# Patient Record
Sex: Male | Born: 1960
Health system: Southern US, Community
[De-identification: ages and names within clinical notes are randomized; demographics above are authoritative.]

## PROBLEM LIST (undated history)

## (undated) DIAGNOSIS — G4733 Obstructive sleep apnea (adult) (pediatric): Secondary | ICD-10-CM

## (undated) DIAGNOSIS — Z8679 Personal history of other diseases of the circulatory system: Secondary | ICD-10-CM

## (undated) DIAGNOSIS — Z9289 Personal history of other medical treatment: Secondary | ICD-10-CM

## (undated) DIAGNOSIS — R0789 Other chest pain: Secondary | ICD-10-CM

## (undated) DIAGNOSIS — Z952 Presence of prosthetic heart valve: Secondary | ICD-10-CM

## (undated) DIAGNOSIS — E669 Obesity, unspecified: Secondary | ICD-10-CM

## (undated) DIAGNOSIS — I10 Essential (primary) hypertension: Secondary | ICD-10-CM

## (undated) DIAGNOSIS — R569 Unspecified convulsions: Secondary | ICD-10-CM

## (undated) DIAGNOSIS — L409 Psoriasis, unspecified: Secondary | ICD-10-CM

## (undated) DIAGNOSIS — E785 Hyperlipidemia, unspecified: Secondary | ICD-10-CM

## (undated) DIAGNOSIS — Z951 Presence of aortocoronary bypass graft: Secondary | ICD-10-CM

## (undated) HISTORY — DX: Hyperlipidemia, unspecified: E78.5

## (undated) HISTORY — DX: Obstructive sleep apnea (adult) (pediatric): G47.33

## (undated) HISTORY — PX: CORNEAL TRANSPLANT: SHX108

## (undated) HISTORY — DX: Obesity, unspecified: E66.9

## (undated) HISTORY — PX: KNEE SURGERY: SHX244

## (undated) HISTORY — DX: Essential (primary) hypertension: I10

## (undated) HISTORY — DX: Unspecified convulsions: R56.9

## (undated) HISTORY — DX: Presence of prosthetic heart valve: Z95.2

## (undated) HISTORY — DX: Psoriasis, unspecified: L40.9

## (undated) HISTORY — DX: Other chest pain: R07.89

## (undated) HISTORY — DX: Personal history of other medical treatment: Z92.89

---

## 1998-08-08 ENCOUNTER — Other Ambulatory Visit: Admission: RE | Admit: 1998-08-08 | Discharge: 1998-08-08 | Payer: Self-pay | Admitting: *Deleted

## 1999-02-17 ENCOUNTER — Emergency Department (HOSPITAL_COMMUNITY): Admission: EM | Admit: 1999-02-17 | Discharge: 1999-02-17 | Payer: Self-pay | Admitting: Emergency Medicine

## 2000-04-19 ENCOUNTER — Encounter: Payer: Self-pay | Admitting: Emergency Medicine

## 2000-04-19 ENCOUNTER — Emergency Department (HOSPITAL_COMMUNITY): Admission: EM | Admit: 2000-04-19 | Discharge: 2000-04-19 | Payer: Self-pay | Admitting: Emergency Medicine

## 2000-08-19 ENCOUNTER — Encounter: Admission: RE | Admit: 2000-08-19 | Discharge: 2000-11-17 | Payer: Self-pay | Admitting: Internal Medicine

## 2000-10-06 ENCOUNTER — Ambulatory Visit (HOSPITAL_COMMUNITY): Admission: RE | Admit: 2000-10-06 | Discharge: 2000-10-06 | Payer: Self-pay | Admitting: Gastroenterology

## 2001-10-06 ENCOUNTER — Emergency Department (HOSPITAL_COMMUNITY): Admission: EM | Admit: 2001-10-06 | Discharge: 2001-10-06 | Payer: Self-pay | Admitting: Emergency Medicine

## 2001-10-06 ENCOUNTER — Encounter: Payer: Self-pay | Admitting: Emergency Medicine

## 2002-07-05 ENCOUNTER — Encounter: Payer: Self-pay | Admitting: Emergency Medicine

## 2002-07-05 ENCOUNTER — Emergency Department (HOSPITAL_COMMUNITY): Admission: EM | Admit: 2002-07-05 | Discharge: 2002-07-05 | Payer: Self-pay | Admitting: Emergency Medicine

## 2002-11-12 ENCOUNTER — Emergency Department (HOSPITAL_COMMUNITY): Admission: EM | Admit: 2002-11-12 | Discharge: 2002-11-12 | Payer: Self-pay | Admitting: *Deleted

## 2002-12-17 ENCOUNTER — Emergency Department (HOSPITAL_COMMUNITY): Admission: EM | Admit: 2002-12-17 | Discharge: 2002-12-17 | Payer: Self-pay | Admitting: Emergency Medicine

## 2002-12-17 ENCOUNTER — Encounter: Payer: Self-pay | Admitting: Emergency Medicine

## 2003-01-27 ENCOUNTER — Encounter: Payer: Self-pay | Admitting: Emergency Medicine

## 2003-01-27 ENCOUNTER — Emergency Department (HOSPITAL_COMMUNITY): Admission: EM | Admit: 2003-01-27 | Discharge: 2003-01-27 | Payer: Self-pay | Admitting: Emergency Medicine

## 2004-09-14 ENCOUNTER — Emergency Department (HOSPITAL_COMMUNITY): Admission: EM | Admit: 2004-09-14 | Discharge: 2004-09-14 | Payer: Self-pay | Admitting: Family Medicine

## 2004-12-06 ENCOUNTER — Inpatient Hospital Stay (HOSPITAL_COMMUNITY): Admission: EM | Admit: 2004-12-06 | Discharge: 2004-12-13 | Payer: Self-pay | Admitting: Emergency Medicine

## 2004-12-06 ENCOUNTER — Ambulatory Visit: Payer: Self-pay | Admitting: Cardiology

## 2004-12-06 ENCOUNTER — Encounter (INDEPENDENT_AMBULATORY_CARE_PROVIDER_SITE_OTHER): Payer: Self-pay | Admitting: *Deleted

## 2004-12-06 ENCOUNTER — Encounter: Payer: Self-pay | Admitting: Cardiology

## 2004-12-06 HISTORY — PX: OTHER SURGICAL HISTORY: SHX169

## 2004-12-09 ENCOUNTER — Encounter: Payer: Self-pay | Admitting: Internal Medicine

## 2004-12-16 ENCOUNTER — Ambulatory Visit: Payer: Self-pay | Admitting: Cardiology

## 2004-12-20 ENCOUNTER — Ambulatory Visit: Payer: Self-pay | Admitting: Cardiology

## 2004-12-24 ENCOUNTER — Encounter: Admission: RE | Admit: 2004-12-24 | Discharge: 2004-12-24 | Payer: Self-pay | Admitting: Surgery

## 2004-12-26 ENCOUNTER — Ambulatory Visit: Payer: Self-pay | Admitting: Cardiology

## 2004-12-27 ENCOUNTER — Ambulatory Visit: Payer: Self-pay

## 2004-12-30 ENCOUNTER — Encounter (HOSPITAL_COMMUNITY): Admission: RE | Admit: 2004-12-30 | Discharge: 2005-03-08 | Payer: Self-pay | Admitting: Cardiology

## 2005-01-02 ENCOUNTER — Ambulatory Visit: Payer: Self-pay | Admitting: Internal Medicine

## 2005-01-08 ENCOUNTER — Ambulatory Visit: Payer: Self-pay | Admitting: *Deleted

## 2005-01-08 ENCOUNTER — Ambulatory Visit: Payer: Self-pay | Admitting: Cardiology

## 2005-01-15 ENCOUNTER — Emergency Department (HOSPITAL_COMMUNITY): Admission: EM | Admit: 2005-01-15 | Discharge: 2005-01-15 | Payer: Self-pay | Admitting: Emergency Medicine

## 2005-01-15 ENCOUNTER — Ambulatory Visit: Payer: Self-pay | Admitting: Cardiology

## 2005-01-23 ENCOUNTER — Ambulatory Visit: Payer: Self-pay | Admitting: Internal Medicine

## 2005-02-02 ENCOUNTER — Inpatient Hospital Stay (HOSPITAL_COMMUNITY): Admission: EM | Admit: 2005-02-02 | Discharge: 2005-02-03 | Payer: Self-pay | Admitting: *Deleted

## 2005-02-06 ENCOUNTER — Ambulatory Visit: Payer: Self-pay | Admitting: Internal Medicine

## 2005-02-20 ENCOUNTER — Ambulatory Visit: Payer: Self-pay | Admitting: Internal Medicine

## 2005-02-28 ENCOUNTER — Encounter: Admission: RE | Admit: 2005-02-28 | Discharge: 2005-02-28 | Payer: Self-pay | Admitting: Neurology

## 2005-03-06 ENCOUNTER — Ambulatory Visit: Payer: Self-pay | Admitting: Cardiology

## 2005-03-20 ENCOUNTER — Ambulatory Visit: Payer: Self-pay | Admitting: Cardiology

## 2005-04-11 ENCOUNTER — Ambulatory Visit: Payer: Self-pay | Admitting: Cardiology

## 2005-04-21 ENCOUNTER — Ambulatory Visit: Payer: Self-pay

## 2005-04-21 ENCOUNTER — Encounter: Payer: Self-pay | Admitting: Internal Medicine

## 2005-04-21 ENCOUNTER — Ambulatory Visit: Payer: Self-pay | Admitting: Cardiology

## 2005-05-12 ENCOUNTER — Ambulatory Visit: Payer: Self-pay | Admitting: Cardiology

## 2005-05-29 ENCOUNTER — Ambulatory Visit: Payer: Self-pay | Admitting: Cardiology

## 2005-06-11 ENCOUNTER — Emergency Department (HOSPITAL_COMMUNITY): Admission: EM | Admit: 2005-06-11 | Discharge: 2005-06-11 | Payer: Self-pay | Admitting: Emergency Medicine

## 2005-06-26 ENCOUNTER — Ambulatory Visit: Payer: Self-pay | Admitting: Cardiology

## 2005-07-11 ENCOUNTER — Ambulatory Visit: Payer: Self-pay | Admitting: Cardiology

## 2005-07-31 ENCOUNTER — Ambulatory Visit: Payer: Self-pay | Admitting: Cardiology

## 2005-08-28 ENCOUNTER — Ambulatory Visit: Payer: Self-pay | Admitting: Cardiology

## 2005-09-28 ENCOUNTER — Emergency Department (HOSPITAL_COMMUNITY): Admission: EM | Admit: 2005-09-28 | Discharge: 2005-09-28 | Payer: Self-pay | Admitting: Emergency Medicine

## 2005-10-09 ENCOUNTER — Ambulatory Visit: Payer: Self-pay | Admitting: Cardiology

## 2005-11-07 ENCOUNTER — Ambulatory Visit: Payer: Self-pay | Admitting: Cardiology

## 2005-11-12 ENCOUNTER — Ambulatory Visit: Payer: Self-pay | Admitting: Cardiology

## 2005-11-19 ENCOUNTER — Emergency Department (HOSPITAL_COMMUNITY): Admission: EM | Admit: 2005-11-19 | Discharge: 2005-11-20 | Payer: Self-pay | Admitting: Emergency Medicine

## 2005-11-19 ENCOUNTER — Ambulatory Visit: Payer: Self-pay | Admitting: Cardiology

## 2005-11-20 ENCOUNTER — Inpatient Hospital Stay (HOSPITAL_COMMUNITY): Admission: AD | Admit: 2005-11-20 | Discharge: 2005-11-21 | Payer: Self-pay | Admitting: Cardiovascular Disease

## 2005-11-20 ENCOUNTER — Encounter: Payer: Self-pay | Admitting: Cardiology

## 2005-12-02 ENCOUNTER — Ambulatory Visit: Payer: Self-pay | Admitting: Emergency Medicine

## 2005-12-05 ENCOUNTER — Ambulatory Visit: Payer: Self-pay

## 2005-12-09 ENCOUNTER — Ambulatory Visit: Payer: Self-pay | Admitting: Internal Medicine

## 2005-12-29 ENCOUNTER — Ambulatory Visit (HOSPITAL_BASED_OUTPATIENT_CLINIC_OR_DEPARTMENT_OTHER): Admission: RE | Admit: 2005-12-29 | Discharge: 2005-12-29 | Payer: Self-pay | Admitting: Emergency Medicine

## 2006-01-02 ENCOUNTER — Ambulatory Visit: Payer: Self-pay | Admitting: Cardiology

## 2006-01-02 ENCOUNTER — Ambulatory Visit: Payer: Self-pay | Admitting: Pulmonary Disease

## 2006-01-10 ENCOUNTER — Emergency Department (HOSPITAL_COMMUNITY): Admission: EM | Admit: 2006-01-10 | Discharge: 2006-01-10 | Payer: Self-pay | Admitting: Emergency Medicine

## 2006-01-30 ENCOUNTER — Ambulatory Visit: Payer: Self-pay | Admitting: Cardiovascular Disease

## 2006-03-06 ENCOUNTER — Ambulatory Visit: Payer: Self-pay | Admitting: Cardiology

## 2006-03-27 ENCOUNTER — Ambulatory Visit: Payer: Self-pay | Admitting: Cardiology

## 2006-04-29 ENCOUNTER — Ambulatory Visit: Payer: Self-pay | Admitting: Cardiology

## 2006-06-12 ENCOUNTER — Ambulatory Visit: Payer: Self-pay | Admitting: Cardiology

## 2006-06-20 ENCOUNTER — Ambulatory Visit: Payer: Self-pay | Admitting: Cardiology

## 2006-06-20 ENCOUNTER — Inpatient Hospital Stay (HOSPITAL_COMMUNITY): Admission: EM | Admit: 2006-06-20 | Discharge: 2006-06-21 | Payer: Self-pay | Admitting: Emergency Medicine

## 2006-07-10 ENCOUNTER — Ambulatory Visit: Payer: Self-pay | Admitting: Internal Medicine

## 2006-07-10 ENCOUNTER — Ambulatory Visit: Payer: Self-pay | Admitting: Cardiology

## 2006-08-07 ENCOUNTER — Ambulatory Visit: Payer: Self-pay | Admitting: Internal Medicine

## 2006-09-04 ENCOUNTER — Ambulatory Visit: Payer: Self-pay | Admitting: Cardiology

## 2006-10-05 ENCOUNTER — Ambulatory Visit: Payer: Self-pay | Admitting: Cardiology

## 2006-10-13 ENCOUNTER — Ambulatory Visit: Payer: Self-pay | Admitting: Cardiology

## 2006-10-22 ENCOUNTER — Ambulatory Visit: Payer: Self-pay | Admitting: Internal Medicine

## 2006-11-20 ENCOUNTER — Ambulatory Visit: Payer: Self-pay | Admitting: Cardiovascular Disease

## 2006-12-28 ENCOUNTER — Ambulatory Visit: Payer: Self-pay | Admitting: Cardiology

## 2007-02-22 ENCOUNTER — Ambulatory Visit: Payer: Self-pay | Admitting: Internal Medicine

## 2007-03-25 ENCOUNTER — Emergency Department (HOSPITAL_COMMUNITY): Admission: EM | Admit: 2007-03-25 | Discharge: 2007-03-25 | Payer: Self-pay | Admitting: Emergency Medicine

## 2007-05-21 ENCOUNTER — Ambulatory Visit: Payer: Self-pay | Admitting: Cardiology

## 2007-06-17 ENCOUNTER — Ambulatory Visit: Payer: Self-pay | Admitting: Internal Medicine

## 2007-07-16 ENCOUNTER — Ambulatory Visit: Payer: Self-pay | Admitting: Cardiology

## 2007-08-08 ENCOUNTER — Emergency Department (HOSPITAL_COMMUNITY): Admission: EM | Admit: 2007-08-08 | Discharge: 2007-08-08 | Payer: Self-pay | Admitting: Emergency Medicine

## 2007-08-13 ENCOUNTER — Ambulatory Visit: Payer: Self-pay | Admitting: Cardiology

## 2007-09-10 ENCOUNTER — Ambulatory Visit: Payer: Self-pay | Admitting: Cardiology

## 2007-10-08 ENCOUNTER — Ambulatory Visit: Payer: Self-pay | Admitting: Internal Medicine

## 2007-11-08 ENCOUNTER — Ambulatory Visit: Payer: Self-pay | Admitting: Cardiology

## 2007-11-08 ENCOUNTER — Ambulatory Visit: Payer: Self-pay | Admitting: Cardiovascular Disease

## 2007-12-03 ENCOUNTER — Emergency Department (HOSPITAL_COMMUNITY): Admission: EM | Admit: 2007-12-03 | Discharge: 2007-12-03 | Payer: Self-pay | Admitting: Emergency Medicine

## 2007-12-31 ENCOUNTER — Ambulatory Visit: Payer: Self-pay | Admitting: Cardiology

## 2008-02-03 ENCOUNTER — Ambulatory Visit: Payer: Self-pay | Admitting: Cardiology

## 2008-03-03 ENCOUNTER — Ambulatory Visit: Payer: Self-pay | Admitting: Internal Medicine

## 2008-03-31 ENCOUNTER — Ambulatory Visit: Payer: Self-pay | Admitting: Cardiology

## 2008-04-26 ENCOUNTER — Emergency Department (HOSPITAL_COMMUNITY): Admission: EM | Admit: 2008-04-26 | Discharge: 2008-04-26 | Payer: Self-pay | Admitting: Emergency Medicine

## 2008-05-05 ENCOUNTER — Ambulatory Visit: Payer: Self-pay | Admitting: Cardiology

## 2008-05-31 ENCOUNTER — Ambulatory Visit: Payer: Self-pay | Admitting: Cardiovascular Disease

## 2008-06-12 ENCOUNTER — Ambulatory Visit: Payer: Self-pay | Admitting: Internal Medicine

## 2008-07-02 ENCOUNTER — Emergency Department (HOSPITAL_COMMUNITY): Admission: EM | Admit: 2008-07-02 | Discharge: 2008-07-02 | Payer: Self-pay | Admitting: Emergency Medicine

## 2008-07-04 ENCOUNTER — Emergency Department (HOSPITAL_COMMUNITY): Admission: EM | Admit: 2008-07-04 | Discharge: 2008-07-05 | Payer: Self-pay | Admitting: Emergency Medicine

## 2008-07-07 ENCOUNTER — Ambulatory Visit: Payer: Self-pay | Admitting: Internal Medicine

## 2008-07-21 ENCOUNTER — Ambulatory Visit: Payer: Self-pay | Admitting: Internal Medicine

## 2008-08-04 ENCOUNTER — Ambulatory Visit: Payer: Self-pay | Admitting: Internal Medicine

## 2008-08-18 ENCOUNTER — Ambulatory Visit: Payer: Self-pay | Admitting: Cardiovascular Disease

## 2008-09-07 ENCOUNTER — Ambulatory Visit: Payer: Self-pay | Admitting: Cardiovascular Disease

## 2008-09-28 ENCOUNTER — Ambulatory Visit: Payer: Self-pay | Admitting: Internal Medicine

## 2008-10-26 ENCOUNTER — Ambulatory Visit: Payer: Self-pay | Admitting: Internal Medicine

## 2008-11-07 ENCOUNTER — Encounter: Payer: Self-pay | Admitting: *Deleted

## 2008-11-20 DIAGNOSIS — E669 Obesity, unspecified: Secondary | ICD-10-CM | POA: Insufficient documentation

## 2008-11-20 DIAGNOSIS — G4733 Obstructive sleep apnea (adult) (pediatric): Secondary | ICD-10-CM

## 2008-11-20 DIAGNOSIS — I10 Essential (primary) hypertension: Secondary | ICD-10-CM

## 2008-11-20 DIAGNOSIS — E785 Hyperlipidemia, unspecified: Secondary | ICD-10-CM | POA: Insufficient documentation

## 2008-11-20 DIAGNOSIS — R0789 Other chest pain: Secondary | ICD-10-CM

## 2008-11-23 ENCOUNTER — Ambulatory Visit: Payer: Self-pay | Admitting: Cardiology

## 2008-11-23 ENCOUNTER — Ambulatory Visit: Payer: Self-pay | Admitting: Internal Medicine

## 2008-11-23 LAB — CONVERTED CEMR LAB
POC INR: 3
Protime: 20.9

## 2008-11-29 ENCOUNTER — Telehealth: Payer: Self-pay | Admitting: Cardiology

## 2008-12-13 ENCOUNTER — Encounter: Payer: Self-pay | Admitting: *Deleted

## 2008-12-22 ENCOUNTER — Ambulatory Visit: Payer: Self-pay | Admitting: Cardiology

## 2008-12-22 LAB — CONVERTED CEMR LAB
POC INR: 2.9
Prothrombin Time: 20.6 s

## 2009-01-19 ENCOUNTER — Encounter: Payer: Self-pay | Admitting: Cardiology

## 2009-01-19 ENCOUNTER — Ambulatory Visit: Payer: Self-pay

## 2009-01-19 ENCOUNTER — Ambulatory Visit: Payer: Self-pay | Admitting: Internal Medicine

## 2009-01-19 LAB — CONVERTED CEMR LAB: POC INR: 3.1

## 2009-02-23 ENCOUNTER — Ambulatory Visit: Payer: Self-pay | Admitting: Internal Medicine

## 2009-02-23 LAB — CONVERTED CEMR LAB: POC INR: 3.8

## 2009-03-09 ENCOUNTER — Ambulatory Visit: Payer: Self-pay | Admitting: Internal Medicine

## 2009-03-09 LAB — CONVERTED CEMR LAB: POC INR: 3.4

## 2009-03-23 ENCOUNTER — Ambulatory Visit: Payer: Self-pay | Admitting: Internal Medicine

## 2009-03-23 LAB — CONVERTED CEMR LAB: POC INR: 3.4

## 2009-04-13 ENCOUNTER — Ambulatory Visit: Payer: Self-pay | Admitting: Cardiovascular Disease

## 2009-04-13 LAB — CONVERTED CEMR LAB: POC INR: 1.9

## 2009-05-02 ENCOUNTER — Ambulatory Visit: Payer: Self-pay | Admitting: Internal Medicine

## 2009-05-02 LAB — CONVERTED CEMR LAB: POC INR: 3

## 2009-05-25 ENCOUNTER — Ambulatory Visit: Payer: Self-pay | Admitting: Cardiovascular Disease

## 2009-05-25 LAB — CONVERTED CEMR LAB: POC INR: 2.4

## 2009-06-22 ENCOUNTER — Ambulatory Visit: Payer: Self-pay | Admitting: Internal Medicine

## 2009-06-22 LAB — CONVERTED CEMR LAB: POC INR: 3.3

## 2009-07-13 ENCOUNTER — Ambulatory Visit: Payer: Self-pay | Admitting: Internal Medicine

## 2009-08-07 ENCOUNTER — Telehealth: Payer: Self-pay | Admitting: Cardiovascular Disease

## 2009-08-10 ENCOUNTER — Ambulatory Visit: Payer: Self-pay | Admitting: Cardiology

## 2009-08-10 LAB — CONVERTED CEMR LAB: POC INR: 3

## 2009-09-07 ENCOUNTER — Ambulatory Visit: Payer: Self-pay | Admitting: Internal Medicine

## 2009-09-07 LAB — CONVERTED CEMR LAB: POC INR: 2.8

## 2009-10-04 ENCOUNTER — Ambulatory Visit: Payer: Self-pay | Admitting: Cardiology

## 2009-11-02 ENCOUNTER — Ambulatory Visit: Payer: Self-pay | Admitting: Cardiology

## 2009-11-29 ENCOUNTER — Ambulatory Visit: Payer: Self-pay | Admitting: Internal Medicine

## 2010-01-03 ENCOUNTER — Ambulatory Visit: Payer: Self-pay | Admitting: Internal Medicine

## 2010-01-03 ENCOUNTER — Ambulatory Visit: Payer: Self-pay | Admitting: Cardiology

## 2010-01-03 LAB — CONVERTED CEMR LAB: POC INR: 2.6

## 2010-02-01 ENCOUNTER — Ambulatory Visit: Payer: Self-pay | Admitting: Cardiology

## 2010-02-28 ENCOUNTER — Ambulatory Visit: Payer: Self-pay | Admitting: Cardiology

## 2010-04-08 ENCOUNTER — Ambulatory Visit: Payer: Self-pay | Admitting: Cardiology

## 2010-04-08 LAB — CONVERTED CEMR LAB: POC INR: 3.3

## 2010-05-03 ENCOUNTER — Ambulatory Visit: Payer: Self-pay | Admitting: Cardiology

## 2010-05-03 LAB — CONVERTED CEMR LAB: POC INR: 2.3

## 2010-05-06 ENCOUNTER — Telehealth (INDEPENDENT_AMBULATORY_CARE_PROVIDER_SITE_OTHER): Payer: Self-pay | Admitting: *Deleted

## 2010-05-31 ENCOUNTER — Ambulatory Visit: Payer: Self-pay | Admitting: Cardiology

## 2010-06-28 DIAGNOSIS — L0291 Cutaneous abscess, unspecified: Secondary | ICD-10-CM | POA: Insufficient documentation

## 2010-06-30 ENCOUNTER — Encounter: Payer: Self-pay | Admitting: Cardiology

## 2010-06-30 ENCOUNTER — Encounter: Payer: Self-pay | Admitting: Internal Medicine

## 2010-07-08 ENCOUNTER — Ambulatory Visit
Admission: RE | Admit: 2010-07-08 | Discharge: 2010-07-08 | Payer: Self-pay | Source: Home / Self Care | Attending: Cardiology | Admitting: Cardiology

## 2010-07-08 DIAGNOSIS — L408 Other psoriasis: Secondary | ICD-10-CM | POA: Insufficient documentation

## 2010-07-08 LAB — CONVERTED CEMR LAB: POC INR: 2.3

## 2010-07-11 NOTE — Medication Information (Signed)
Summary: ccr  Anticoagulant Therapy  Managed by: Reina Fuse, PharmD Referring MD: Sharrell Ku PCP: Dorothyann Peng MD Supervising MD: Shirlee Latch MD, Archita Lomeli Indication 1: Aortic Valve Replacement (ICD-V43.3) Indication 2: Aortic Valve Disorder (ICD-424.1) Lab Used: [facility] Site: [facility] INR POC 3.3 INR RANGE 2 - 3  Dietary changes: no    Health status changes: yes       Details: Pt had cold all last week. Took OTC meds.  Bleeding/hemorrhagic complications: no    Recent/future hospitalizations: no    Any changes in medication regimen? no    Recent/future dental: no  Any missed doses?: no       Is patient compliant with meds? yes       Allergies: No Known Drug Allergies  Anticoagulation Management History:      The patient is taking warfarin and comes in today for a routine follow up visit.  Negative risk factors for bleeding include an age less than 52 years old.  The bleeding index is 'low risk'.  Positive CHADS2 values include History of HTN.  Negative CHADS2 values include Age > 27 years old.  The start date was 12/08/2004.  Anticoagulation responsible provider: Shirlee Latch MD, Laporchia Nakajima.  INR POC: 3.3.  Cuvette Lot#: [id number].  Exp: 03/10/2011.    Anticoagulation Management Assessment/Plan:      The patient's current anticoagulation dose is Warfarin sodium 7.5 mg tabs: Use as directed by Anticoagulation Clinic.  The target INR is 2.0-3.0.  The next INR is due 05/06/2010.  Anticoagulation instructions were given to patient.  Results were reviewed/authorized by Reina Fuse, PharmD.  He was notified by Reina Fuse PharmD.         Prior Anticoagulation Instructions: INR 2.8  Continue taking one tablet every day.  We will see you in four weeks.   Current Anticoagulation Instructions: INR 3.3  Do not take Coumadin today, Monday, October 31st. Then resume taking Coumadin 1 tab (7.5 mg) every day. Return to clinic in 4 weeks.

## 2010-07-11 NOTE — Medication Information (Signed)
Summary: rov/ewj  Anticoagulant Therapy  Managed by: Charolotte Eke, PharmD Referring MD: Sharrell Ku PCP: Dorothyann Peng MD Supervising MD: Gala Romney MD, Reuel Boom Indication 1: Aortic Valve Replacement (ICD-V43.3) Indication 2: Aortic Valve Disorder (ICD-424.1) Lab Used: [facility] Site: [facility] INR POC 2.6 INR RANGE 2 - 3  Dietary changes: no    Health status changes: no    Bleeding/hemorrhagic complications: no    Recent/future hospitalizations: no    Any changes in medication regimen? no    Recent/future dental: no  Any missed doses?: no       Is patient compliant with meds? yes       Current Medications (verified): 1)  Toprol Xl 50 Mg Xr24h-Tab (Metoprolol Succinate) .Marland Kitchen.. 1 Tab Once Daily 2)  Aspirin 81 Mg Tbec (Aspirin) .... Take One Tablet By Mouth Daily 3)  Lyrica 75 Mg Caps (Pregabalin) .Marland Kitchen.. 1 Tab Once Daily 4)  Welchol 625 Mg Tabs (Colesevelam Hcl) .... 3 Tabs Two Times A Day 5)  Vitamin D 1000 Unit Tabs (Cholecalciferol) .Marland Kitchen.. 1 Tab On Tues and Fridays 6)  Celebrex 200 Mg Caps (Celecoxib) .Marland Kitchen.. 1 Cap Once Daily 7)  Trilipix 135 Mg Cpdr (Choline Fenofibrate) .Marland Kitchen.. 1 Tab Once Daily 8)  Warfarin Sodium 7.5 Mg Tabs (Warfarin Sodium) .... Use As Directed By Anticoagulation Clinic 9)  Flector 1.3 % Ptch (Diclofenac Epolamine) .... Apply Patch Every 12 Hrs As Needed  Allergies (verified): No Known Drug Allergies  Anticoagulation Management History:      The patient is taking warfarin and comes in today for a routine follow up visit.  Negative risk factors for bleeding include an age less than 42 years old.  The bleeding index is 'low risk'.  Positive CHADS2 values include History of HTN.  Negative CHADS2 values include Age > 40 years old.  The start date was 12/08/2004.  Anticoagulation responsible provider: Rafferty Postlewait MD, Reuel Boom.  INR POC: 2.6.  Cuvette Lot#: [id number].  Exp: 03/10/2011.    Anticoagulation Management Assessment/Plan:      The patient's current  anticoagulation dose is Warfarin sodium 7.5 mg tabs: Use as directed by Anticoagulation Clinic.  The target INR is 2.0-3.0.  The next INR is due 02/01/2010.  Anticoagulation instructions were given to patient.  Results were reviewed/authorized by Charolotte Eke, PharmD.  He was notified by Charolotte Eke, PharmD.         Prior Anticoagulation Instructions: INR 2.6  Continue on same dosage 7.5mg  daily.  Recheck in 4 weeks.   Current Anticoagulation Instructions: Continue same: 7.5mg  daily.

## 2010-07-11 NOTE — Assessment & Plan Note (Signed)
Summary: yearly/sl   Visit Type:  1 yr f/u Primary Provider:  Dorothyann Peng MD  CC:  no cardiac complaints today.  History of Present Illness: [name] comes in today for followup. He is doing remarkably well. He's actually lost a considerable amount of weight up to about 20 pounds. He has more energy and more stamina.  He continues to work full-time. He is very compliant with his medications. His Coumadin has been well regulated. He denies any bleeding or melena.  He still does not like to wear CPAP. Have advised again to try to do this.  He denies palpitations, chest pain, orthopnea, PND or edema.  Current Medications (verified): 1)  Toprol Xl 50 Mg Xr24h-Tab (Metoprolol Succinate) .Marland Kitchen.. 1 Tab Once Daily 2)  Aspirin 81 Mg Tbec (Aspirin) .... Take One Tablet By Mouth Daily 3)  Welchol 625 Mg Tabs (Colesevelam Hcl) .... 3 Tabs Two Times A Day 4)  Vitamin D 1000 Unit Tabs (Cholecalciferol) .Marland Kitchen.. 1 Tab On Tues and Fridays 5)  Trilipix 135 Mg Cpdr (Choline Fenofibrate) .Marland Kitchen.. 1 Tab Once Daily 6)  Warfarin Sodium 7.5 Mg Tabs (Warfarin Sodium) .... Use As Directed By Anticoagulation Clinic 7)  Flector 1.3 % Ptch (Diclofenac Epolamine) .... Apply Patch Every 12 Hrs As Needed  Allergies (verified): No Known Drug Allergies  Past History:  Past Medical History: Last updated: 11/20/2008 AORTIC VALVE REPLACEMENT, HX OF..ST.JUDE (ICD-V43.3) CHEST PAIN, ATYPICAL (ICD-786.59) HYPERTENSION, UNSPECIFIED (ICD-401.9) HYPERLIPIDEMIA-MIXED (ICD-272.4) OBESITY (ICD-278.00) SLEEP APNEA, OBSTRUCTIVE (ICD-327.23)    Past Surgical History: Last updated: 11/20/2008 Emergency median sternotomy, extracorporeal circulation, replacement of aortic valve and ascending aortic dissecting aneurysm using a 27-mm St. Jude mechanical valve conduit with reimplantation of both coronary arteries...12/06/2004 Intraoperative transesophageal echocardiogram... 12/06/2004  Family History: Last updated:  11/20/2008 Family History of Cancer:  Mother and sister  both had cervical ca Family History of Coronary Artery Disease: Mother Family History of Diabetes: Mother and father  Social History: Last updated: 11/20/2008 Married  Tobacco Use - Former.  quit 1991 Alcohol Use - no  quit 1991 Regular Exercise - no Drug Use - no  Risk Factors: Exercise: no (11/20/2008)  Risk Factors: Smoking Status: quit (11/20/2008)  Review of Systems       negative other than history of present illness  Vital Signs:  Patient profile:   50 year old male Height:      74 inches Weight:      321.1 pounds BMI:     41.38 Pulse rate:   62 / minute Pulse rhythm:   irregular BP sitting:   120 / 70  (left arm) Cuff size:   large  Vitals Entered By: Danielle Rankin, CMA (January 03, 2010 3:53 PM)  Physical Exam  General:  obese.   Head:  normocephalic and atraumatic Eyes:  PERRLA/EOM intact; conjunctiva and lids normal. Neck:  Neck supple, no JVD. No masses, thyromegaly or abnormal cervical nodes. Chest [name]:  no deformities or breast masses noted Lungs:  Clear bilaterally to auscultation and percussion. Heart:  PMI poorly appreciated, regular rate and rhythm, normal S1 prosthetic S2, no diastolic murmur. Msk:  Back normal, normal gait. Muscle strength and tone normal. Pulses:  pulses normal in all 4 extremities Extremities:  1+ left pedal edema and 1+ right pedal edema.   Neurologic:  Alert and oriented x 3. Skin:  Intact without lesions or rashes. Psych:  Normal affect.   EKG  Procedure date:  01/03/2010  Findings:      sinus rhythm  with occasional PACs versus fusion beats. No acute changes  Impression & Recommendations:  Problem # 1:  AORTIC VALVE REPLACEMENT, HX OF..ST.JUDE (ICD-V43.3) Assessment Unchanged  Problem # 2:  HYPERTENSION, UNSPECIFIED (ICD-401.9) Assessment: Improved  His updated medication list for this problem includes:    Toprol Xl 50 Mg Xr24h-tab (Metoprolol succinate)  .Marland Kitchen... 1 tab once daily    Aspirin 81 Mg Tbec (Aspirin) .Marland Kitchen... Take one tablet by mouth daily  Orders: EKG w/ Interpretation (93000)  Problem # 3:  HYPERLIPIDEMIA-MIXED (ICD-272.4)  His updated medication list for this problem includes:    Welchol 625 Mg Tabs (Colesevelam hcl) .Marland KitchenMarland KitchenMarland KitchenMarland Kitchen 3 tabs two times a day    Trilipix 135 Mg Cpdr (Choline fenofibrate) .Marland Kitchen... 1 tab once daily  Problem # 4:  SLEEP APNEA, OBSTRUCTIVE (ICD-327.23) I advised him again to wear CPAP. He'll continue to lose a significant amount of weight.  Problem # 5:  OBESITY (ICD-278.00)  Patient Instructions: 1)  Your physician recommends that you schedule a follow-up appointment in: YEAR WITH DR Mashell Sieben 2)  Your physician recommends that you continue on your current medications as directed. Please refer to the Current Medication list given to you today.

## 2010-07-11 NOTE — Medication Information (Signed)
Summary: rov/sl  Anticoagulant Therapy  Managed by: Weston Brass, PharmD Referring MD: Sharrell Ku PCP: Dorothyann Peng MD Supervising MD: Diona Browner MD, Remi Deter Indication 1: Aortic Valve Replacement (ICD-V43.3) Indication 2: Aortic Valve Disorder (ICD-424.1) Lab Used: [facility] Site: [facility] INR POC 2.3 INR RANGE 2 - 3  Dietary changes: no    Health status changes: no    Bleeding/hemorrhagic complications: no    Recent/future hospitalizations: no    Any changes in medication regimen? no    Recent/future dental: no  Any missed doses?: no       Is patient compliant with meds? yes       Allergies: No Known Drug Allergies  Anticoagulation Management History:      The patient is taking warfarin and comes in today for a routine follow up visit.  Negative risk factors for bleeding include an age less than 71 years old.  The bleeding index is 'low risk'.  Positive CHADS2 values include History of HTN.  Negative CHADS2 values include Age > 49 years old.  The start date was 12/08/2004.  Anticoagulation responsible provider: Diona Browner MD, Remi Deter.  INR POC: 2.3.  Cuvette Lot#: [id number].  Exp: 05/2011.    Anticoagulation Management Assessment/Plan:      The patient's current anticoagulation dose is Warfarin sodium 7.5 mg tabs: Use as directed by Anticoagulation Clinic.  The target INR is 2.0-3.0.  The next INR is due 05/31/2010.  Anticoagulation instructions were given to patient.  Results were reviewed/authorized by Weston Brass, PharmD.  He was notified by Weston Brass PharmD.         Prior Anticoagulation Instructions: INR 3.3  Do not take Coumadin today, Monday, October 31st. Then resume taking Coumadin 1 tab (7.5 mg) every day. Return to clinic in 4 weeks.   Current Anticoagulation Instructions: INR 2.3  Continue same dose of 1 tablet every day.  Recheck INR in 4 weeks.

## 2010-07-11 NOTE — Progress Notes (Signed)
Summary: has a question regarding her husband  Phone Note Call from Patient Call back at [phone]   Caller: Spouse / Consuella Lose Reason for Call: Talk to Nurse, Talk to Doctor Summary of Call: she has a question she wants to discuss with coumadin clinic only would not give me any information Initial call taken by: Omer Jack,  August 07, 2009 12:25 PM  Follow-up for Phone Call        Returned call 315 pm 08/07/2009.  Pt has bronchitis and will start new meds.  Is this ok?    Advised to keep appt.   Follow-up by: Shelby Dubin PharmD, BCPS, CPP,  August 07, 2009 3:35 PM

## 2010-07-11 NOTE — Medication Information (Signed)
Summary: rov/eac  Anticoagulant Therapy  Managed by: Cloyde Reams, RN, BSN Referring MD: Sharrell Ku PCP: Dorothyann Peng MD Supervising MD: Johney Frame MD, Fayrene Fearing Indication 1: Aortic Valve Replacement (ICD-V43.3) Indication 2: Aortic Valve Disorder (ICD-424.1) Lab Used: [facility] Site: [facility] INR POC 2.6 INR RANGE 2 - 3  Dietary changes: no    Health status changes: no    Bleeding/hemorrhagic complications: no    Recent/future hospitalizations: no    Any changes in medication regimen? yes       Details: Started on new pain patch Flector 1.3%  Recent/future dental: no  Any missed doses?: no       Is patient compliant with meds? yes       Allergies: No Known Drug Allergies  Anticoagulation Management History:      The patient is taking warfarin and comes in today for a routine follow up visit.  Negative risk factors for bleeding include an age less than 39 years old.  The bleeding index is 'low risk'.  Positive CHADS2 values include History of HTN.  Negative CHADS2 values include Age > 76 years old.  The start date was 12/08/2004.  Anticoagulation responsible provider: Durward Matranga MD, Fayrene Fearing.  INR POC: 2.6.  Cuvette Lot#: [id number].  Exp: 02/2011.    Anticoagulation Management Assessment/Plan:      The patient's current anticoagulation dose is Warfarin sodium 7.5 mg tabs: Use as directed by Anticoagulation Clinic.  The target INR is 2.0-3.0.  The next INR is due 01/03/2010.  Anticoagulation instructions were given to patient.  Results were reviewed/authorized by Cloyde Reams, RN, BSN.  He was notified by Cloyde Reams RN.         Prior Anticoagulation Instructions: INR 3.8  Do NOT take coumadin today.  Then return to normal dosing schedule of 1 tablet every day.  Return to clinic in 4 weeks.    Current Anticoagulation Instructions: INR 2.6  Continue on same dosage 7.5mg  daily.  Recheck in 4 weeks.

## 2010-07-11 NOTE — Medication Information (Signed)
Summary: rov/ewj  Anticoagulant Therapy  Managed by: Elaina Pattee, PharmD Referring MD: Sharrell Ku PCP: Dorothyann Peng MD Supervising MD: Tenny Craw MD, Gunnar Fusi Indication 1: Aortic Valve Replacement (ICD-V43.3) Indication 2: Aortic Valve Disorder (ICD-424.1) Lab Used: [facility] Site: [facility] INR POC 2.8 INR RANGE 2 - 3  Dietary changes: no    Health status changes: no    Bleeding/hemorrhagic complications: no    Recent/future hospitalizations: no    Any changes in medication regimen? no    Recent/future dental: no  Any missed doses?: no       Is patient compliant with meds? yes       Allergies: No Known Drug Allergies  Anticoagulation Management History:      The patient is taking warfarin and comes in today for a routine follow up visit.  Negative risk factors for bleeding include an age less than 97 years old.  The bleeding index is 'low risk'.  Positive CHADS2 values include History of HTN.  Negative CHADS2 values include Age > 13 years old.  The start date was 12/08/2004.  Anticoagulation responsible provider: Tenny Craw MD, Gunnar Fusi.  INR POC: 2.8.  Cuvette Lot#: [id number].  Exp: 10/2010.    Anticoagulation Management Assessment/Plan:      The patient's current anticoagulation dose is Warfarin sodium 7.5 mg tabs: Use as directed by Anticoagulation Clinic.  The target INR is 2.0-3.0.  The next INR is due 10/04/2009.  Anticoagulation instructions were given to patient.  Results were reviewed/authorized by Elaina Pattee, PharmD.  He was notified by Elaina Pattee, PharmD.         Prior Anticoagulation Instructions: INR 3.0  Continue on same dosage 7.5mg  daily.  Recheck in 4 weeks.    Current Anticoagulation Instructions: INR 2.8. Take 1 tablet daily. Recheck in 4 weeks.The patient is to stop coumadin.

## 2010-07-11 NOTE — Medication Information (Signed)
Summary: rov/ewj  Anticoagulant Therapy  Managed by: Cloyde Reams, RN, BSN Referring MD: Sharrell Ku PCP: Dorothyann Peng MD Supervising MD: Johney Frame MD, Fayrene Fearing Indication 1: Aortic Valve Replacement (ICD-V43.3) Indication 2: Aortic Valve Disorder (ICD-424.1) Lab Used: [facility] Site: [facility] INR POC 2.9 INR RANGE 2 - 3  Dietary changes: no    Health status changes: no    Bleeding/hemorrhagic complications: no    Recent/future hospitalizations: no    Any changes in medication regimen? no    Recent/future dental: no  Any missed doses?: no       Is patient compliant with meds? yes       Allergies (verified): No Known Drug Allergies  Anticoagulation Management History:      The patient is taking warfarin and comes in today for a routine follow up visit.  Negative risk factors for bleeding include an age less than 55 years old.  The bleeding index is 'low risk'.  Positive CHADS2 values include History of HTN.  Negative CHADS2 values include Age > 66 years old.  The start date was 12/08/2004.  Anticoagulation responsible provider: Lue Dubuque MD, Fayrene Fearing.  INR POC: 2.9.  Cuvette Lot#: [id number].  Exp: 09/2010.    Anticoagulation Management Assessment/Plan:      The patient's current anticoagulation dose is Warfarin sodium 7.5 mg tabs: Use as directed by Anticoagulation Clinic.  The target INR is 2.0-3.0.  The next INR is due 08/10/2009.  Anticoagulation instructions were given to patient.  Results were reviewed/authorized by Cloyde Reams, RN, BSN.  He was notified by Cloyde Reams RN.         Prior Anticoagulation Instructions: INR 3.3  Start taking 7.5mg  daily.  Recheck in 3 weeks.    Current Anticoagulation Instructions: INR 2.9  Continue on same dosage 1 tablet daily.  Recheck in 4 weeks.

## 2010-07-11 NOTE — Medication Information (Signed)
Summary: rov coumadin - [facility]  Anticoagulant Therapy  Managed by: Eda Keys, PharmD Referring MD: Sharrell Ku PCP: Dorothyann Peng MD Supervising MD: Myrtis Ser MD, Tinnie Gens Indication 1: Aortic Valve Replacement (ICD-V43.3) Indication 2: Aortic Valve Disorder (ICD-424.1) Lab Used: [facility] Site: [facility] INR POC 3.8 INR RANGE 2 - 3  Dietary changes: no    Health status changes: no    Bleeding/hemorrhagic complications: no    Recent/future hospitalizations: no    Any changes in medication regimen? no    Recent/future dental: no  Any missed doses?: yes     Details: One dose missed  ~2 weeks ago  Is patient compliant with meds? yes       Allergies: No Known Drug Allergies  Anticoagulation Management History:      The patient is taking warfarin and comes in today for a routine follow up visit.  Negative risk factors for bleeding include an age less than 63 years old.  The bleeding index is 'low risk'.  Positive CHADS2 values include History of HTN.  Negative CHADS2 values include Age > 88 years old.  The start date was 12/08/2004.  Anticoagulation responsible provider: Myrtis Ser MD, Tinnie Gens.  INR POC: 3.8.  Cuvette Lot#: [id number].  Exp: 01/2011.    Anticoagulation Management Assessment/Plan:      The patient's current anticoagulation dose is Warfarin sodium 7.5 mg tabs: Use as directed by Anticoagulation Clinic.  The target INR is 2.0-3.0.  The next INR is due 11/30/2009.  Anticoagulation instructions were given to patient.  Results were reviewed/authorized by Eda Keys, PharmD.  He was notified by Eda Keys.         Prior Anticoagulation Instructions: INR 3.0  Coumadin 7.5mg  = 1 tab each day  Current Anticoagulation Instructions: INR 3.8  Do NOT take coumadin today.  Then return to normal dosing schedule of 1 tablet every day.  Return to clinic in 4 weeks.

## 2010-07-11 NOTE — Medication Information (Signed)
Summary: rov/cb  Anticoagulant Therapy  Managed by: Leota Sauers, PharmD, BCPS, CPP Referring MD: Sharrell Ku PCP: Dorothyann Peng MD Supervising MD: Daleen Squibb MD, Maisie Fus Indication 1: Aortic Valve Replacement (ICD-V43.3) Indication 2: Aortic Valve Disorder (ICD-424.1) Lab Used: [facility] Site: [facility] INR POC 3.0 INR RANGE 2 - 3  Dietary changes: no    Health status changes: no    Bleeding/hemorrhagic complications: no    Recent/future hospitalizations: no    Any changes in medication regimen? no    Recent/future dental: no  Any missed doses?: no       Is patient compliant with meds? yes       Current Medications (verified): 1)  Toprol Xl 50 Mg Xr24h-Tab (Metoprolol Succinate) .Marland Kitchen.. 1 Tab Once Daily 2)  Aspirin 81 Mg Tbec (Aspirin) .... Take One Tablet By Mouth Daily 3)  Lyrica 75 Mg Caps (Pregabalin) .Marland Kitchen.. 1 Tab Once Daily 4)  Welchol 625 Mg Tabs (Colesevelam Hcl) .... 3 Tabs Two Times A Day 5)  Vitamin D 1000 Unit Tabs (Cholecalciferol) .Marland Kitchen.. 1 Tab On Tues and Fridays 6)  Celebrex 200 Mg Caps (Celecoxib) .Marland Kitchen.. 1 Cap Once Daily 7)  Trilipix 135 Mg Cpdr (Choline Fenofibrate) .Marland Kitchen.. 1 Tab Once Daily 8)  Warfarin Sodium 7.5 Mg Tabs (Warfarin Sodium) .... Use As Directed By Anticoagulation Clinic  Allergies (verified): No Known Drug Allergies  Anticoagulation Management History:      The patient is taking warfarin and comes in today for a routine follow up visit.  Negative risk factors for bleeding include an age less than 34 years old.  The bleeding index is 'low risk'.  Positive CHADS2 values include History of HTN.  Negative CHADS2 values include Age > 37 years old.  The start date was 12/08/2004.  Anticoagulation responsible provider: Daleen Squibb MD, Maisie Fus.  INR POC: 3.0.  Cuvette Lot#: [id number].  Exp: 10/2010.    Anticoagulation Management Assessment/Plan:      The patient's current anticoagulation dose is Warfarin sodium 7.5 mg tabs: Use as directed by Anticoagulation Clinic.   The target INR is 2.0-3.0.  The next INR is due 11/01/2009.  Anticoagulation instructions were given to patient.  Results were reviewed/authorized by Leota Sauers, PharmD, BCPS, CPP.         Prior Anticoagulation Instructions: INR 2.8. Take 1 tablet daily. Recheck in 4 weeks.The patient is to stop coumadin.    Current Anticoagulation Instructions: INR 3.0  Coumadin 7.5mg  = 1 tab each day

## 2010-07-11 NOTE — Progress Notes (Signed)
  Phone Note Call from Patient      

## 2010-07-11 NOTE — Medication Information (Signed)
Summary: rov/jk  Anticoagulant Therapy  Managed by: Weston Brass, PharmD Referring MD: Sharrell Ku PCP: Dorothyann Peng MD Supervising MD: Shirlee Latch MD, Freida Busman Indication 1: Aortic Valve Replacement (ICD-V43.3) Indication 2: Aortic Valve Disorder (ICD-424.1) Lab Used: [facility] Site: [facility] INR POC 2.8 INR RANGE 2 - 3  Dietary changes: no    Health status changes: no    Bleeding/hemorrhagic complications: no    Recent/future hospitalizations: no    Any changes in medication regimen? no    Recent/future dental: no  Any missed doses?: no       Is patient compliant with meds? yes       Allergies: No Known Drug Allergies  Anticoagulation Management History:      The patient is taking warfarin and comes in today for a routine follow up visit.  Negative risk factors for bleeding include an age less than 12 years old.  The bleeding index is 'low risk'.  Positive CHADS2 values include History of HTN.  Negative CHADS2 values include Age > 76 years old.  The start date was 12/08/2004.  Anticoagulation responsible provider: Shirlee Latch MD, [place].  INR POC: 2.8.  Exp: 03/10/2011.    Anticoagulation Management Assessment/Plan:      The patient's current anticoagulation dose is Warfarin sodium 7.5 mg tabs: Use as directed by Anticoagulation Clinic.  The target INR is 2.0-3.0.  The next INR is due 03/29/2010.  Anticoagulation instructions were given to patient.  Results were reviewed/authorized by Weston Brass, PharmD.  He was notified by Kennieth Francois.         Prior Anticoagulation Instructions: INR 2.9  Continue taking 1 tablet (7.5mg ) every day.  Recheck in 4 weeks.   Current Anticoagulation Instructions: INR 2.8  Continue taking one tablet every day.  We will see you in four weeks.

## 2010-07-11 NOTE — Medication Information (Signed)
Summary: rov/ewj  Anticoagulant Therapy  Managed by: Cloyde Reams, RN, BSN Referring MD: Sharrell Ku PCP: Dorothyann Peng MD Supervising MD: Juanda Chance MD, Breeanne Oblinger Indication 1: Aortic Valve Replacement (ICD-V43.3) Indication 2: Aortic Valve Disorder (ICD-424.1) Lab Used: [facility] Site: [facility] INR POC 3.0 INR RANGE 2 - 3  Dietary changes: no    Health status changes: no    Bleeding/hemorrhagic complications: no    Recent/future hospitalizations: no    Any changes in medication regimen? yes       Details: On abx for bronchitis completed today, started Monday.    Recent/future dental: no  Any missed doses?: no       Is patient compliant with meds? yes       Allergies (verified): No Known Drug Allergies  Anticoagulation Management History:      The patient is taking warfarin and comes in today for a routine follow up visit.  Negative risk factors for bleeding include an age less than 36 years old.  The bleeding index is 'low risk'.  Positive CHADS2 values include History of HTN.  Negative CHADS2 values include Age > 50 years old.  The start date was 12/08/2004.  Anticoagulation responsible provider: Juanda Chance MD, Smitty Cords.  INR POC: 3.0.  Cuvette Lot#: [id number].  Exp: 10/2010.    Anticoagulation Management Assessment/Plan:      The patient's current anticoagulation dose is Warfarin sodium 7.5 mg tabs: Use as directed by Anticoagulation Clinic.  The target INR is 2.0-3.0.  The next INR is due 09/07/2009.  Anticoagulation instructions were given to patient.  Results were reviewed/authorized by Cloyde Reams, RN, BSN.  He was notified by Cloyde Reams RN.         Prior Anticoagulation Instructions: INR 2.9  Continue on same dosage 1 tablet daily.  Recheck in 4 weeks.    Current Anticoagulation Instructions: INR 3.0  Continue on same dosage 7.5mg  daily.  Recheck in 4 weeks.   Prescriptions: WARFARIN SODIUM 7.5 MG TABS (WARFARIN SODIUM) Use as directed by Anticoagulation Clinic   #35 x 3   Entered by:   Cloyde Reams RN   Authorized by:   Gaylord Shih, MD, [facility]   Signed by:   Cloyde Reams RN on 08/10/2009   Method used:   Electronically to        [facility] [address] [phone]* (retail)       [address]       [place], [place]  [address]       Ph: [phone] or [phone]       Fax: [phone]   RxID:   [id number]

## 2010-07-11 NOTE — Medication Information (Signed)
Summary: [name]  Anticoagulant Therapy  Managed by: Cloyde Reams, RN, BSN Referring MD: Sharrell Ku PCP: Dorothyann Peng MD Supervising MD: Gala Romney MD, Reuel Boom Indication 1: Aortic Valve Replacement (ICD-V43.3) Indication 2: Aortic Valve Disorder (ICD-424.1) Lab Used: [facility] Site: [facility] INR POC 3.3 INR RANGE 2 - 3  Dietary changes: no    Health status changes: no    Bleeding/hemorrhagic complications: no     Any changes in medication regimen? no    Recent/future dental: no  Any missed doses?: no       Is patient compliant with meds? yes       Allergies (verified): No Known Drug Allergies  Anticoagulation Management History:      The patient is taking warfarin and comes in today for a routine follow up visit.  Negative risk factors for bleeding include an age less than 79 years old.  The bleeding index is 'low risk'.  Positive CHADS2 values include History of HTN.  Negative CHADS2 values include Age > 29 years old.  The start date was 12/08/2004.  Anticoagulation responsible provider: Bensimhon MD, Reuel Boom.  INR POC: 3.3.  Cuvette Lot#: [id number].  Exp: 09/2010.    Anticoagulation Management Assessment/Plan:      The patient's current anticoagulation dose is Warfarin sodium 7.5 mg tabs: Use as directed by Anticoagulation Clinic.  The target INR is 2.0-3.0.  The next INR is due 07/13/2009.  Anticoagulation instructions were given to patient.  Results were reviewed/authorized by Cloyde Reams, RN, BSN.  He was notified by Bethena Midget, RN, BSN.         Prior Anticoagulation Instructions: INR 2.4  CONTINUE TO TAKE 1 TAB EVERYDAY EXCEPT TAKE 1.5 TABS ON FRIDAY.  RECHECK IN 4 WEEKS.  Current Anticoagulation Instructions: INR 3.3  Start taking 7.5mg  daily.  Recheck in 3 weeks.

## 2010-07-11 NOTE — Medication Information (Signed)
Summary: rov/sp  Anticoagulant Therapy  Managed by: Samantha Crimes, PharmD Referring MD: Sharrell Ku PCP: Dorothyann Peng MD Supervising MD: Myrtis Ser MD, Tinnie Gens Indication 1: Aortic Valve Replacement (ICD-V43.3) Indication 2: Aortic Valve Disorder (ICD-424.1) Lab Used: [facility] Site: [facility] INR POC 2.4 INR RANGE 2 - 3  Dietary changes: no    Health status changes: no    Bleeding/hemorrhagic complications: no    Recent/future hospitalizations: no    Any changes in medication regimen? no    Recent/future dental: no  Any missed doses?: no       Is patient compliant with meds? yes       Current Medications (verified): 1)  Toprol Xl 50 Mg Xr24h-Tab (Metoprolol Succinate) .Marland Kitchen.. 1 Tab Once Daily 2)  Aspirin 81 Mg Tbec (Aspirin) .... Take One Tablet By Mouth Daily 3)  Welchol 625 Mg Tabs (Colesevelam Hcl) .... 3 Tabs Two Times A Day 4)  Vitamin D 1000 Unit Tabs (Cholecalciferol) .Marland Kitchen.. 1 Tab On Tues and Fridays 5)  Trilipix 135 Mg Cpdr (Choline Fenofibrate) .Marland Kitchen.. 1 Tab Once Daily 6)  Warfarin Sodium 7.5 Mg Tabs (Warfarin Sodium) .... Use As Directed By Anticoagulation Clinic 7)  Flector 1.3 % Ptch (Diclofenac Epolamine) .... Apply Patch Every 12 Hrs As Needed  Allergies (verified): No Known Drug Allergies  Anticoagulation Management History:      Negative risk factors for bleeding include an age less than 72 years old.  The bleeding index is 'low risk'.  Positive CHADS2 values include History of HTN.  Negative CHADS2 values include Age > 46 years old.  The start date was 12/08/2004.  Anticoagulation responsible provider: Myrtis Ser MD, Tinnie Gens.  INR POC: 2.4.  Exp: 05/2011.    Anticoagulation Management Assessment/Plan:      The patient's current anticoagulation dose is Warfarin sodium 7.5 mg tabs: Use as directed by Anticoagulation Clinic.  The target INR is 2.0-3.0.  The next INR is due 06/28/2010.  Anticoagulation instructions were given to patient.  Results were reviewed/authorized  by Samantha Crimes, PharmD.         Prior Anticoagulation Instructions: INR 2.3  Continue same dose of 1 tablet every day.  Recheck INR in 4 weeks.   Current Anticoagulation Instructions: Cont with current regimen Return to clinic on Jan 20th, at 315 pm

## 2010-07-11 NOTE — Medication Information (Signed)
Summary: rov-tp  Anticoagulant Therapy  Managed by: Weston Brass, PharmD Referring MD: Sharrell Ku PCP: Dorothyann Peng MD Supervising MD: Antoine Poche MD, Fayrene Fearing Indication 1: Aortic Valve Replacement (ICD-V43.3) Indication 2: Aortic Valve Disorder (ICD-424.1) Lab Used: [facility] Site: [facility] INR POC 2.9 INR RANGE 2 - 3  Dietary changes: no    Health status changes: no    Bleeding/hemorrhagic complications: no    Recent/future hospitalizations: no    Any changes in medication regimen? no    Recent/future dental: no  Any missed doses?: no       Is patient compliant with meds? yes       Allergies: No Known Drug Allergies  Anticoagulation Management History:      The patient is taking warfarin and comes in today for a routine follow up visit.  Negative risk factors for bleeding include an age less than 79 years old.  The bleeding index is 'low risk'.  Positive CHADS2 values include History of HTN.  Negative CHADS2 values include Age > 40 years old.  The start date was 12/08/2004.  Anticoagulation responsible provider: Antoine Poche MD, Fayrene Fearing.  INR POC: 2.9.  Cuvette Lot#: [id number].  Exp: 03/10/2011.    Anticoagulation Management Assessment/Plan:      The patient's current anticoagulation dose is Warfarin sodium 7.5 mg tabs: Use as directed by Anticoagulation Clinic.  The target INR is 2.0-3.0.  The next INR is due 03/01/2010.  Anticoagulation instructions were given to patient.  Results were reviewed/authorized by Weston Brass, PharmD.  He was notified by Gweneth Fritter, PharmD Candidate.         Prior Anticoagulation Instructions: Continue same: 7.5mg  daily.  Current Anticoagulation Instructions: INR 2.9  Continue taking 1 tablet (7.5mg ) every day.  Recheck in 4 weeks.

## 2010-07-17 NOTE — Medication Information (Signed)
Summary: rov/jb  Anticoagulant Therapy  Managed by: Bethena Midget, RN, BSN Referring MD: Sharrell Ku PCP: Dorothyann Peng MD Supervising MD: Antoine Poche MD, Fayrene Fearing Indication 1: Aortic Valve Replacement (ICD-V43.3) Indication 2: Aortic Valve Disorder (ICD-424.1) Lab Used: [facility] Site: [facility] INR POC 2.3 INR RANGE 2 - 3  Dietary changes: no    Health status changes: no    Bleeding/hemorrhagic complications: no    Recent/future hospitalizations: no    Any changes in medication regimen? yes       Details: Has been on ABX for 2 weeks.   Recent/future dental: no  Any missed doses?: no       Is patient compliant with meds? yes      Comments: ON 07/25/10 to see Vascular Surgeon for swelling in Rt leg  Allergies: No Known Drug Allergies  Anticoagulation Management History:      The patient comes in today for his initial visit for anticoagulation therapy.  Negative risk factors for bleeding include an age less than 76 years old.  The bleeding index is 'low risk'.  Positive CHADS2 values include History of HTN.  Negative CHADS2 values include Age > 26 years old.  The start date was 12/08/2004.  Anticoagulation responsible provider: Antoine Poche MD, Fayrene Fearing.  INR POC: 2.3.  Cuvette Lot#: [id number].  Exp: 06/2011.    Anticoagulation Management Assessment/Plan:      The patient's current anticoagulation dose is Warfarin sodium 7.5 mg tabs: Use as directed by Anticoagulation Clinic.  The target INR is 2.0-3.0.  The next INR is due 08/09/2010.  Anticoagulation instructions were given to patient.  Results were reviewed/authorized by Bethena Midget, RN, BSN.  He was notified by Bethena Midget, RN, BSN.         Prior Anticoagulation Instructions: Cont with current regimen Return to clinic on Jan 20th, at 315 pm  Current Anticoagulation Instructions: INR 2.3 Continue 7.5mg s everyday. Recheck in 4 weeks.  Prescriptions: WARFARIN SODIUM 7.5 MG TABS (WARFARIN SODIUM) Use as directed by  Anticoagulation Clinic  #35 x 4   Entered by:   Bethena Midget, RN, BSN   Authorized by:   Rollene Rotunda, MD, [facility]   Signed by:   Bethena Midget, RN, BSN on 07/08/2010   Method used:   Print then Give to Patient   RxID:   [id number] WARFARIN SODIUM 7.5 MG TABS (WARFARIN SODIUM) Use as directed by Anticoagulation Clinic  #35 x 4   Entered by:   Bethena Midget, RN, BSN   Authorized by:   Gaylord Shih, MD, [facility]   Signed by:   Bethena Midget, RN, BSN on 07/08/2010   Method used:   Print then Give to Patient   RxID:   [id number]

## 2010-07-29 DIAGNOSIS — Z952 Presence of prosthetic heart valve: Secondary | ICD-10-CM | POA: Insufficient documentation

## 2010-07-29 DIAGNOSIS — Z7901 Long term (current) use of anticoagulants: Secondary | ICD-10-CM | POA: Insufficient documentation

## 2010-07-29 DIAGNOSIS — I359 Nonrheumatic aortic valve disorder, unspecified: Secondary | ICD-10-CM | POA: Insufficient documentation

## 2010-08-09 ENCOUNTER — Encounter (INDEPENDENT_AMBULATORY_CARE_PROVIDER_SITE_OTHER): Payer: BC Managed Care – PPO

## 2010-08-09 ENCOUNTER — Encounter: Payer: Self-pay | Admitting: Cardiovascular Disease

## 2010-08-09 DIAGNOSIS — I359 Nonrheumatic aortic valve disorder, unspecified: Secondary | ICD-10-CM

## 2010-08-09 DIAGNOSIS — Z954 Presence of other heart-valve replacement: Secondary | ICD-10-CM

## 2010-08-09 DIAGNOSIS — Z7901 Long term (current) use of anticoagulants: Secondary | ICD-10-CM

## 2010-08-09 LAB — CONVERTED CEMR LAB: POC INR: 2.9

## 2010-08-15 NOTE — Medication Information (Signed)
Summary: rov/tm- pt will be here at 330pm..tm  Anticoagulant Therapy  Managed by: Windell Hummingbird, RN Referring MD: Sharrell Ku PCP: Dorothyann Peng MD Supervising MD: Clifton James MD, Cristal Deer Indication 1: Aortic Valve Replacement (ICD-V43.3) Indication 2: Aortic Valve Disorder (ICD-424.1) Lab Used: [facility] Site: [facility] INR POC 2.9 INR RANGE 2 - 3  Dietary changes: no    Health status changes: no    Bleeding/hemorrhagic complications: no    Recent/future hospitalizations: no    Any changes in medication regimen? no    Recent/future dental: no  Any missed doses?: no       Is patient compliant with meds? yes       Allergies: No Known Drug Allergies  Anticoagulation Management History:      The patient is taking warfarin and comes in today for a routine follow up visit.  Negative risk factors for bleeding include an age less than 45 years old.  The bleeding index is 'low risk'.  Positive CHADS2 values include History of HTN.  Negative CHADS2 values include Age > 20 years old.  The start date was 12/08/2004.  Anticoagulation responsible provider: Clifton James MD, Cristal Deer.  INR POC: 2.9.  Cuvette Lot#: [id number].  Exp: 06/2011.    Anticoagulation Management Assessment/Plan:      The patient's current anticoagulation dose is Warfarin sodium 7.5 mg tabs: Use as directed by Anticoagulation Clinic.  The target INR is 2.0-3.0.  The next INR is due 09/06/2010.  Anticoagulation instructions were given to patient.  Results were reviewed/authorized by Windell Hummingbird, RN.  He was notified by Windell Hummingbird, RN.         Prior Anticoagulation Instructions: INR 2.3 Continue 7.5mg s everyday. Recheck in 4 weeks.   Current Anticoagulation Instructions: INR 2.9 Continue taking 1 tablet every day.  Recheck in 4 weeks.

## 2010-09-06 ENCOUNTER — Ambulatory Visit (INDEPENDENT_AMBULATORY_CARE_PROVIDER_SITE_OTHER): Payer: BC Managed Care – PPO | Admitting: *Deleted

## 2010-09-06 DIAGNOSIS — I359 Nonrheumatic aortic valve disorder, unspecified: Secondary | ICD-10-CM

## 2010-09-06 DIAGNOSIS — Z952 Presence of prosthetic heart valve: Secondary | ICD-10-CM

## 2010-09-06 DIAGNOSIS — Z7901 Long term (current) use of anticoagulants: Secondary | ICD-10-CM

## 2010-09-06 DIAGNOSIS — Z954 Presence of other heart-valve replacement: Secondary | ICD-10-CM

## 2010-09-06 NOTE — Patient Instructions (Signed)
INR 2.4 Continue taking 1 tablet (7.5mg ) daily. Recheck in 4 weeks.

## 2010-09-23 LAB — CBC
HCT: 36.7 % — ABNORMAL LOW (ref 39.0–52.0)
HCT: 38.6 % — ABNORMAL LOW (ref 39.0–52.0)
Hemoglobin: 12 g/dL — ABNORMAL LOW (ref 13.0–17.0)
Hemoglobin: 12.3 g/dL — ABNORMAL LOW (ref 13.0–17.0)
MCHC: 31.8 g/dL (ref 30.0–36.0)
MCHC: 32.5 g/dL (ref 30.0–36.0)
MCV: 89.1 fL (ref 78.0–100.0)
MCV: 89.8 fL (ref 78.0–100.0)
Platelets: 220 10*3/uL (ref 150–400)
RBC: 4.12 MIL/uL — ABNORMAL LOW (ref 4.22–5.81)
RBC: 4.3 MIL/uL (ref 4.22–5.81)
RDW: 14.5 % (ref 11.5–15.5)
RDW: 14.6 % (ref 11.5–15.5)

## 2010-09-23 LAB — POCT I-STAT, CHEM 8
Calcium, Ion: 1.19 mmol/L (ref 1.12–1.32)
Creatinine, Ser: 1.3 mg/dL (ref 0.4–1.5)
Glucose, Bld: 117 mg/dL — ABNORMAL HIGH (ref 70–99)
HCT: 41 % (ref 39.0–52.0)
Hemoglobin: 13.3 g/dL (ref 13.0–17.0)
Hemoglobin: 13.9 g/dL (ref 13.0–17.0)
Potassium: 4 mEq/L (ref 3.5–5.1)
Sodium: 142 mEq/L (ref 135–145)
Sodium: 145 mEq/L (ref 135–145)
TCO2: 26 mmol/L (ref 0–100)
TCO2: 30 mmol/L (ref 0–100)

## 2010-09-23 LAB — DIFFERENTIAL
Basophils Absolute: 0 10*3/uL (ref 0.0–0.1)
Basophils Absolute: 0 10*3/uL (ref 0.0–0.1)
Basophils Relative: 0 % (ref 0–1)
Basophils Relative: 1 % (ref 0–1)
Eosinophils Absolute: 0.2 10*3/uL (ref 0.0–0.7)
Eosinophils Absolute: 0.2 10*3/uL (ref 0.0–0.7)
Lymphocytes Relative: 15 % (ref 12–46)
Lymphocytes Relative: 19 % (ref 12–46)
Monocytes Absolute: 1 10*3/uL (ref 0.1–1.0)
Neutro Abs: 5.2 10*3/uL (ref 1.7–7.7)
Neutro Abs: 5.9 10*3/uL (ref 1.7–7.7)
Neutrophils Relative %: 67 % (ref 43–77)

## 2010-09-23 LAB — URINALYSIS, ROUTINE W REFLEX MICROSCOPIC
Bilirubin Urine: NEGATIVE
Hgb urine dipstick: NEGATIVE
Nitrite: NEGATIVE
Protein, ur: NEGATIVE mg/dL
Specific Gravity, Urine: 1.024 (ref 1.005–1.030)
Urobilinogen, UA: 0.2 mg/dL (ref 0.0–1.0)

## 2010-10-04 ENCOUNTER — Encounter: Payer: BC Managed Care – PPO | Admitting: *Deleted

## 2010-10-16 ENCOUNTER — Emergency Department (HOSPITAL_COMMUNITY): Payer: BC Managed Care – PPO

## 2010-10-16 ENCOUNTER — Emergency Department (HOSPITAL_COMMUNITY)
Admission: EM | Admit: 2010-10-16 | Discharge: 2010-10-16 | Disposition: A | Discharge status: Home / Self Care | Payer: BC Managed Care – PPO | Attending: Emergency Medicine | Admitting: Emergency Medicine

## 2010-10-16 DIAGNOSIS — R42 Dizziness and giddiness: Secondary | ICD-10-CM | POA: Insufficient documentation

## 2010-10-16 DIAGNOSIS — R079 Chest pain, unspecified: Secondary | ICD-10-CM | POA: Insufficient documentation

## 2010-10-16 DIAGNOSIS — I714 Abdominal aortic aneurysm, without rupture, unspecified: Secondary | ICD-10-CM | POA: Insufficient documentation

## 2010-10-16 DIAGNOSIS — I10 Essential (primary) hypertension: Secondary | ICD-10-CM | POA: Insufficient documentation

## 2010-10-16 DIAGNOSIS — R259 Unspecified abnormal involuntary movements: Secondary | ICD-10-CM | POA: Insufficient documentation

## 2010-10-16 DIAGNOSIS — R110 Nausea: Secondary | ICD-10-CM | POA: Insufficient documentation

## 2010-10-16 DIAGNOSIS — R002 Palpitations: Secondary | ICD-10-CM | POA: Insufficient documentation

## 2010-10-16 DIAGNOSIS — E7800 Pure hypercholesterolemia, unspecified: Secondary | ICD-10-CM | POA: Insufficient documentation

## 2010-10-16 DIAGNOSIS — Z954 Presence of other heart-valve replacement: Secondary | ICD-10-CM | POA: Insufficient documentation

## 2010-10-16 LAB — CBC
Hemoglobin: 12.2 g/dL — ABNORMAL LOW (ref 13.0–17.0)
MCH: 28.2 pg (ref 26.0–34.0)
MCV: 89.1 fL (ref 78.0–100.0)
Platelets: 177 10*3/uL (ref 150–400)
RBC: 4.33 MIL/uL (ref 4.22–5.81)
WBC: 8.1 10*3/uL (ref 4.0–10.5)

## 2010-10-16 LAB — BASIC METABOLIC PANEL
CO2: 28 mEq/L (ref 19–32)
Calcium: 9.2 mg/dL (ref 8.4–10.5)
GFR calc Af Amer: >60 mL/min (ref >60)
GFR calc non Af Amer: >60 mL/min (ref >60)
Potassium: 3.6 mEq/L (ref 3.5–5.1)
Sodium: 140 mEq/L (ref 135–145)

## 2010-10-16 LAB — POCT CARDIAC MARKERS
CKMB, poc: 7.6 ng/mL (ref 1.0–8.0)
Myoglobin, poc: 77.7 ng/mL (ref 12–200)
Troponin i, poc: <0.05 ng/mL (ref 0.00–0.09)
Troponin i, poc: <0.05 ng/mL (ref 0.00–0.09)

## 2010-10-16 LAB — DIFFERENTIAL
Eosinophils Absolute: 0.1 10*3/uL (ref 0.0–0.7)
Lymphocytes Relative: 25 % (ref 12–46)
Lymphs Abs: 2 10*3/uL (ref 0.7–4.0)
Monocytes Relative: 13 % — ABNORMAL HIGH (ref 3–12)
Neutro Abs: 4.9 10*3/uL (ref 1.7–7.7)
Neutrophils Relative %: 60 % (ref 43–77)

## 2010-10-22 NOTE — Assessment & Plan Note (Signed)
[facility]                            CARDIOLOGY OFFICE NOTE   NAME:[name], [name]                  MRN:          [id number]  DATE:11/08/2007                            DOB:          [birth year]    [name] returns today for follow-up.   PROBLEM LIST:  1. Type 1 aortic root dissection status post St. Jude aortic valve      replacement in June 2006.  He had normal coronary arteries at the      time of his urgent surgery.  He had normal left ventricular      function.  His echocardiogram was stable November 20, 2005.  2. Atypical chest pain, which has not been a recurrent problem.  3. Hyperlipidemia, followed by Dr. Allyne Gee.  4. Hypertension.  5. Obesity.  Unfortunately, he continues to gain weight and is now      312, from 303 last year.  6. Obstructive sleep apnea.  7. History of abnormal CT, stable on repeat exam October 05, 2006, with      a right paratracheal lymph node.   He has been doing well.  He went to the emergency room in March for back  pain.  He had blood work as well as an EKG which were unremarkable.  I  have reviewed those records today.   He is currently on:  1. Coumadin followed in our Coumadin Clinic.  2. Toprol-XL 50 mg a day.  3. Aspirin 81 mg a day.  4. Zetia 10 mg a day.  5. Lyrica 75 mg a day.  6. Welchol 625 three b.i.d.  7. Calcium and vitamin D.   His blood pressure today is 132/66, his pulse is 56 and regular.  EKG on  May 29 at [facility] was essentially normal except for first-degree AV block.  This is stable.  HEENT:  Unchanged.  Carotid upstrokes are equal bilaterally without  bruits, no JVD.  Thyroid is not enlarged.  Trachea is midline.  LUNGS:  Clear.  HEART:  Reveals a poorly appreciated PMI.  He has normal S1, S2.  His S2  is a loud prosthetic sound.  There is no diastolic component.  It splits  physiologically.  ABDOMINAL EXAM:  Protuberant, good bowel sounds.  No obvious tenderness.  No midline  bruit.  EXTREMITIES:  No edema.  Pulses are intact.  NEUROLOGIC EXAM:  Intact.   [name] is doing well from our standpoint.  Will plan on seeing him  back in a year.     Thomas C. Daleen Squibb, MD, [facility]  Electronically Signed    TCW/MedQ  DD: 11/08/2007  DT: 11/08/2007  Job #: [id number]   cc:   Candyce Churn. Allyne Gee, M.D.

## 2010-10-22 NOTE — Assessment & Plan Note (Signed)
[facility]                            CARDIOLOGY OFFICE NOTE   NAME:Milliman, ANNIE ROSEBOOM                  MRN:          [id number]  DATE:10/13/2006                            DOB:          08/24/1960    Mr. Keating returns today for further management of the following  issues:   PROBLEMS:  1. History of type I aortic root dissection, status post St. Jude      aortic valve replacement in June, 2006.  He had normal coronary      arteries at the time of his procedure.  He has normal left      ventricular function.  Echo was stable on November 20, 2005.  2. Atypical chest pain.  3. Hyperlipidemia, well maintained on Welchol and Zetia, followed by      Dr. Allyne Gee.  4. Hypertension.  5. Obesity.  6. Obstructive sleep apnea with partial compliance with CPAP.  7. History of abnormal CT, stable on repeat exam on October 05, 2006      with a high right paratracheal lymph node that is stable in size      and shape.   He is doing well except for some slight dyspnea on exertion, doing yard  work this spring.  He is probably somewhat conditioned.  His weight is  also still high.  He weighs 303 today.   MEDICATIONS:  1. Coumadin through the Coumadin clinic.  2. Toprol XL 50 mg a day.  3. Aspirin 81 mg a day.  4. Zetia 10 mg a day.  5. Welchol 3 b.i.d.  6. Lyrica 75 mg p.o. daily.   PHYSICAL EXAMINATION:  VITAL SIGNS:  His blood pressure today is 114/80.  His pulse is 61 and regular.  HEENT:  Normocephalic and atraumatic.  Sclerae are clear.  Extraocular  movements are intact.  PERRLA.  Facial symmetry is normal.  NECK:  Supple.  Carotids are equal bilaterally without bruits.  There is  no JVD.  Thyroid is not enlarged.  Trachea is midline.  LUNGS:  Clear.  HEART:  A poorly appreciated PMI.  He has normal S1 and S2.  The S1 is  prosthetic.  It splits.  There is no diastolic murmur.  ABDOMEN:  Protuberant with good bowel sounds.  Organomegaly could not be  assessed.  EXTREMITIES:  No clubbing, cyanosis or edema.  Pulses are intact.   Electrocardiogram shows sinus rhythm with no changes.   ASSESSMENT/PLAN:  Mr. Gurney is doing well.  I have made no change in  his program.  I encouraged him to lose some weight and to become a  little more conditioned.  We will see him back in a year.     Thomas C. Daleen Squibb, MD, [facility]  Electronically Signed    TCW/MedQ  DD: 10/13/2006  DT: 10/13/2006  Job #: [id number]   cc:   Candyce Churn. Allyne Gee, M.D.

## 2010-10-24 ENCOUNTER — Ambulatory Visit (INDEPENDENT_AMBULATORY_CARE_PROVIDER_SITE_OTHER): Payer: BC Managed Care – PPO | Admitting: *Deleted

## 2010-10-24 DIAGNOSIS — Z7901 Long term (current) use of anticoagulants: Secondary | ICD-10-CM

## 2010-10-24 DIAGNOSIS — I359 Nonrheumatic aortic valve disorder, unspecified: Secondary | ICD-10-CM

## 2010-10-24 DIAGNOSIS — Z952 Presence of prosthetic heart valve: Secondary | ICD-10-CM

## 2010-10-25 NOTE — Discharge Summary (Signed)
NAME:  [name], TRIM NO.:  [id number]   MEDICAL RECORD NO.:  [id number]          PATIENT TYPE:  INP   LOCATION:  3728                         FACILITY:  [facility]   PHYSICIAN:  Renato Battles, M.D.     DATE OF BIRTH:  Aug 10, 1960   DATE OF ADMISSION:  02/02/2005  DATE OF DISCHARGE:  02/03/2005                                 DISCHARGE SUMMARY   REASON FOR ADMISSION:  Chest pain.   DISCHARGE DIAGNOSES:  1.  Non-cardiac chest pain.  2.  Left upper extremity and left lower extremity numbness, unknown      etiology, rule out stroke.  3.  Bradycardia.  Discontinued the patient's beta blocker.  4.  Mechanical aortic valve on Coumadin.  5.  Hyperlipidemia.  6.  Hyperglycemia, but no diabetes.  7.  Psoriasis of the lower extremities.  8.  History of type 1 aortic dissection, status post repair.  9.  Right corneal transplantation with limited vision in that eye.  10. History of trauma to the eye.  11. Obstructive sleep apnea.  12. History of postoperative supraventricular tachycardia.   DISCHARGE MEDICATIONS:  1.  Crestor 20 mg p.o. daily.  2.  Aspirin 81 mg p.o. daily.  3.  Coumadin per protocol.  4.  Ultram as needed.   CONSULTATIONS:  None.   PROCEDURES:  1.  CT angiography of the chest done February 02, 2005, showed surgical      changes of the mediastinum, but no dissection and no other      abnormalities, no PE, and also a small, right-sided, pleural effusion.  2.  Head CT scan, February 02, 2005, showed no evidence of an obstructing      abnormality.  3.  Carotid Doppler showed no significant stenosis or plaque ulcerations.   HISTORY, PHYSICAL, AND HOSPITAL COURSE:  The patient is a pleasant 50-year-  old, African-American male, who was experiencing some chest pain and  numbness and tingling of the left upper extremity while at church.  He  presented to the emergency room for management.  There were no EKG changes,  no point-of-care cardiac enzymes, however,  given the patient's previous  history, he was admitted to rule out MI as well as any malfunction of the  aortic dissection repair.  A chest CT was ordered, a head CT was ordered,  carotid Dopplers were ordered, and cardiac enzymes were called.  Twenty-four  hours after admission, the patient has no chest pain and no shortness of  breath and no symptoms.  He persisted to have left upper extremity and left  lower extremity numbness of unknown significance.  This is to be followed as  an outpatient.  At this time, the patient appears stable for discharge to go  home.   INSTRUCTIONS:   DIET:  Low fat.   ACTIVITY:  As tolerated.   FOLLOWUP:  Patient to see his primary care physician within the next two  weeks.      Renato Battles, M.D.  Electronically Signed     SA/MEDQ  D:  02/03/2005  T:  02/03/2005  Job:  [id number]   cc:  Robyn N. Allyne Gee, M.D.  [address]  [address]  [place]  [place] [address]  Fax: [phone]   Jesse Sans. Wall, M.D.  [address]  [address]  [place]  [place] [address]   Evelene Croon, M.D.  [address]  [place]  [place] [address]  Fax: [phone]

## 2010-10-25 NOTE — Discharge Summary (Signed)
NAME:  [name], [name] NO.:  [id number]   MEDICAL RECORD NO.:  [id number]          PATIENT TYPE:  INP   LOCATION:  3711                         FACILITY:  [facility]   PHYSICIAN:  Dorian Pod, ACNP  DATE OF BIRTH:  1960-06-13   DATE OF ADMISSION:  06/20/2006  DATE OF DISCHARGE:  06/21/2006                               DISCHARGE SUMMARY   PRIMARY CARE:  Is Dr. Dorothyann Peng.   PRIMARY CARDIOLOGIST:  Is Dr. Juanito Doom.   DISCHARGING DIAGNOSIS:  1. Chest pain with atypical features, negative cardiac enzymes x3.  2. Abnormal CT of the chest this admission.  The patient has a right      paratracheal lymph node measuring 9.6 x 13.7 mm at the level of      thoracic inlet similar to the previous examination with      recommendations for repeat CT scan in 3-4 months  3. Obstructive sleep apnea with partial compliance with CPAP.  4. Borderline diabetes:  Hemoglobin A1c this admission 5.8.  5. Hyperlipidemia:  The patient maintained on Welchol and Zetia.  6. Hypertension.  7. Status post aortic valve replacement with history of type 1 aortic      dissection, status post aortic St. Jude mechanical valve in June      2006.  The patient maintained on anticoagulation therapy.  8. Status post stress Myoview July 2007 with ejection fraction of 50%,      no ischemia.   HOSPITAL COURSE:  [name] was a very pleasant 50 year old African  American gentleman with a medical history as stated above who presented  to [facility] Emergency Room on day of admission complaining of  substernal chest discomfort.  He described it as a throbbing sensation  very localized at his left breast with some numbness down both arms and  down his neck.  He states if he sits up he has sharp, intense pain that  is worse.  He rated it as a 6-7 on a scale of 1-10, relieved temporarily  with morphine in the emergency room.  The patient also complained of  increased shortness of breath.  The patient  states all his symptoms  started when he bent over at work to open up a valve on a device.  He  became short of breath, dizzy.  The chest discomfort has continued  intermittently since that time.  He tried antacid at home with no  relief.  Initial EKG showed sinus brady at rate of 46 without acute ST  or T-wave changes.  CT of the chest results as stated above.  Chest x-  ray showed cardiac enlargement without failure.  The patient was  admitted for observation.  Cardiac enzymes negative x3 sets.  Chest  discomfort not as intense as it was.  EKG with no further changes.  The  patient would like to go home.   Vital Signs at time of discharge blood pressure 130/69.  The patient  sating 96% on 2 liters.  Heart rate 55.  The patient is afebrile.  PT  33.2, INR 3.0, hemoglobin A1c 5.8.  H&H 12.5 and 38,  WBCs 9.5, platelets  202,000.  Sodium 137, potassium 4.7, chloride 101, CO2 30, BUN is 9 and  creatinine 0.9 with a glucose of 94.  TSH 0.616.  Cardiac enzymes  negative x3.  Total cholesterol 175, triglycerides 57, LDL 118, HDL 46.  The patient is instructed to continue his medications as prior to this  admission.  He needs to follow up with Dr. Daleen Squibb in the next 2 weeks,  also follow up with Dr. Allyne Gee his primary care physician.  The patient  will need to have followup CT scan done of his chest within 4 months for  reevaluation.  Duration of discharge encounter is 30 minutes.   Medications at discharge include Coumadin as previously instructed,  Toprol XL 50 mg daily, Welchol 625 mg 3 tablets at bedtime, aspirin 81  mg, Cymbalta 60 mg daily or as previously prescribed, Zetia 10 mg daily  and Lyrica as previously prescribed.      Dorian Pod, ACNP     MB/MEDQ  D:  06/21/2006  T:  06/21/2006  Job:  [id number]   cc:   Candyce Churn. Allyne Gee, M.D.  Thomas C. Wall, MD, [facility]

## 2010-10-25 NOTE — Op Note (Signed)
NAME:  [name], [name] NO.:  [id number]   MEDICAL RECORD NO.:  [id number]          PATIENT TYPE:  INP   LOCATION:  2303                         FACILITY:  [facility]   PHYSICIAN:  Evelene Croon, M.D.     DATE OF BIRTH:  08-Nov-1960   DATE OF PROCEDURE:  12/06/2004  DATE OF DISCHARGE:                                 OPERATIVE REPORT   PREOPERATIVE DIAGNOSIS:  Seven-centimeter ascending aortic aneurysm with  acute type I aortic dissection.   POSTOPERATIVE DIAGNOSIS:  Seven-centimeter ascending aortic aneurysm with  acute type I aortic dissection.   OPERATIVE PROCEDURE:  Emergency median sternotomy, extracorporeal  circulation, replacement of aortic valve and ascending aortic dissecting  aneurysm using a 27-mm St. Jude mechanical valve conduit with reimplantation  of both coronary arteries.   ATTENDING SURGEON:  Evelene Croon, M.D.   ASSISTANT:  Shonna Chock, P.A.   ANESTHESIA:  General endotracheal.   CLINICAL HISTORY:  This patient is a 50 year old gentleman with no prior  cardiac history, who was admitted last night by the medical service with  acute onset of substernal chest pain and shortness of breath.  His pain  persisted all through the night and today.  He had a CT scan of the chest to  rule out pulmonary embolism; it showed no evidence of pulmonary embolism,  but did show a 6.8-cm ascending aortic aneurysm.  The scan was not read as  showing an aortic dissection, but there was a possible dissection plane in  the ascending aorta by my examination.  The patient had had an  echocardiogram by Cardiology which showed a 7-cm ascending aortic aneurysm  with moderate-to-severe aortic insufficiency.  I was consulted to see the  patient by Cardiology.  After review of the studies and examination of the  patient, I felt the patient would require emergent repair of his ascending  aortic aneurysm.  I was suspicious that there was an aortic dissection  present,  although it could not definitely be determined by his transthoracic  echo or  CT scan.  An attempt was made to do a cardiac catheterization to  assess its coronary anatomy and rule out a significant coronary lesion, but  due to the patient's large size as well as the large aortic aneurysm, none  of the catheters were long enough to engage the coronary arteries.  Aortic  root injection appeared to show a fairly normal left main coronary artery.  The catheterization did show what looked like localized aortic dissection in  the aortic root.  The patient continued to have chest pain and was taken to  the operating room emergently.  I discussed the procedure with the patient  and his wife including alternatives, benefits, and risks including bleeding,  blood transfusion, infection, stroke, myocardial infarction, and death.  They understood and agreed to proceed.   OPERATIVE PROCEDURE:  The patient was taken to the room and placed on the  table in a supine position.  After induction of general endotracheal  anesthesia, a Foley catheter was placed  in the bladder using sterile  technique.  Then the chest, abdomen and both lower  extremities were prepped  and draped in the usual sterile manner.  A transesophageal echocardiogram  was performed by Anesthesiology.  This showed a 7-cm ascending aortic  aneurysm which began just above the aortic annulus.  There was moderate-to-  severe aortic insufficiency.  There was an aortic dissection that appeared  to be localized to the aortic root.  There was no significant mitral  regurgitation.  Left ventricular function appeared well-preserved.   Then the chest opened through a median sternotomy incision and the  pericardium opened in the midline.  Examination of the heart showed good  ventricular contractility.  The ascending aorta was markedly aneurysmal,  measuring over 7 cm.  This appeared to be confined to the aortic root and  ascending aorta.  At the  level of the innominate artery, the aorta was  fairly normal caliber of about 3 cm.  There was some free fluid in the  pericardium, but it was a milky-appearing fluid without blood.  There was no  sign of rupture.  Then the patient was heparinized and when an adequate  activated clotting time was achieved, the distal ascending aorta was  cannulated using a 24-French aortic cannula for arterial inflow.  Venous  outflow was achieved using a two-staged venous cannula through the right  atrial appendage.  An antegrade cardioplegia __________  .  A left  ventricular vent was placed through the right superior pulmonary vein.  A  retrograde cardioplegia cannula was inserted through the right atrium near  the coronary sinus.  A retrograde cerebroplegia catheter was inserted  through a pursestring suture in the superior vena cava.   The patient placed on cardiopulmonary bypass and cooled to a bladder  temperature of 18 degrees centigrade.  When the patient body temperature had  reached 18 degrees centigrade, the head was placed in a Trendelenburg  position.  The head was packed in ice.  The patient was given steroids and  Pentothal by Anesthesiology.  Then circulation was arrested.  The aortic  cannula was removed.  The distal ascending aorta was transected just before  the takeoff of the innominate artery.  The aorta appeared to be of normal  size and quality in this area.  Then a Hemashield woven double-Velour graft  with a 10-mm sidearm was chosen.  This was a 30-mm graft.  It was cut to the  appropriate length and then sewn end-to-end to the distal ascending aorta  during circulatory arrest using continuous 3-0 Prolene suture.  A felt strip  was used to reinforce the anastomosis.  A retrograde cerebroplegia was given  throughout circulatory arrest.  Then the anastomosis was coated with BioGlue.  Then the aortic cannula was switched into the sidearm of the new  vascular graft.  Circulation was  reestablished and the graft was  crossclamped proximal to the sidearm.  Full circulation was returned.  Circulatory arrest x24 minutes.  The patient was then rewarmed to 37 degrees  centigrade.  Additional doses of retrograde cardioplegia were given at about  20-minute intervals to maintain myocardial temperature around 10 degrees  centigrade.  Topical hypothermic with iced saline was used.   Then the aorta was opened down into the aortic root.  Examination of the  aortic root showed that there was an acute dissection just above the level  of the aortic valve in the noncoronary sinus.  Examination of the native  valve showed there was a 3-leaflet valve.  The sinotubular junction was  markedly dilated.  The valve  was frankly incompetent.  The native aortic  valve was excised.  The aortic sinuses were excised.  The coronary arteries  were excised as buttons.  Care was taken to maintain proper orientation.  Then the aortic annulus sized and a 27-mm St. Jude mechanical valve conduit  was chosen.  This had model number [id number], serial number [id number].  Then a series of pledgeted 2-0 Ethibond horizontal mattress sutures were  placed around the aortic annulus with the pledgets in a subannular position.  The sutures were placed through the sewing ring and the valve lowered into  place.  The sutures were tied sequentially.  This anastomosis was then  coated with BioGlue.   Then the left coronary button was anastomosed to the side of this graft in  an end-to-side manner using continuous 5-0 Prolene suture.  This anastomosis  was lightly coated with BioGlue.  Then the 2 grafts were cut to the  appropriate length and anastomosed in an end-to-end manner using continuous  3-0 Prolene suture.  This anastomosis was covered with BioGlue.  Then the  graft was filled with blood and the right side the heart was filled with  blood to determine the proper site for the right coronary anastomosis.  This   was marked and then the right coronary button was anastomosed to the graft  in an end-to-side manner using continuous 5-0 Prolene suture.  This  anastomosis was also coated with BioGlue.  Then the heart was de-aired and  the head placed in the Trendelenburg position.  The crossclamp was removed  with a time of 143 minutes.  There was spontaneous return of ventricular  fibrillation and the patient was defibrillated into sinus rhythm.  The  anastomoses appeared hemostatic.  Two temporary right ventricular and right  atrial pacing wires were placed and brought out through the skin.   When the patient had rewarmed to 37 degrees centigrade, he was weaned from  cardiopulmonary bypass on low-dose dopamine.  Total bypass time was 193  minutes.  Transesophageal echocardiogram was performed and showed a normal-  functioning St. Jude mechanical valve.  There was no significant aortic insufficiency.  There was no mitral insufficiency.  Left and right  ventricular function appeared well-preserved.  Protamine was then given and  the venous and aortic cannulas were removed without difficulty.  A sidearm  of the aortic graft was oversewn by using continuous 4-0 Prolene suture in 2  layers.  Hemostasis was achieved without difficulty.  The patient was given  10 units of platelets due to thrombocytopenia.  Then 3 chest tubes were  placed with 2 in the posterior pericardium and 1 in the right pleural space,  and 1 in the anterior mediastinum.  The pericardium was reapproximated over  the graft.  The sternum was closed with double #6 stainless steel wires.  The fascia was closed with a continuous #1 Vicryl suture.  Subcutaneous  tissue was closed with continuous 2-0 Vicryl and skin with 3-0 Vicryl  subcuticular closure.  The sponge, needles and instrument counts were  correct according to the scrub nurse.  Dry sterile dressing were applied  over the incisions and around the chest tubes, which were hooked to  Pleur-  evac suction.  The patient remained hemodynamically stable and transferred  to the SICU in guarded but stable condition.       BB/MEDQ  D:  12/07/2004  T:  12/07/2004  Job:  [id number]   cc:   [facility] Cardiology   [facility]  Cardiac Cath Lab

## 2010-10-25 NOTE — H&P (Signed)
NAME:  [name], [name]           ACCOUNT NO.:  [id number]   MEDICAL RECORD NO.:  [id number]          PATIENT TYPE:  INP   LOCATION:  1826                         FACILITY:  [facility]   PHYSICIAN:  Mobolaji B. Bakare, M.D.DATE OF BIRTH:  1960-09-13   DATE OF ADMISSION:  12/06/2004  DATE OF DISCHARGE:                                HISTORY & PHYSICAL   CHIEF COMPLAINT:  Pleuritic chest pain.   HISTORY OF PRESENTING COMPLAINT:  [name] is a 50 year old African  American male with a past medical history of obesity and hyperlipidemia.  He  was laying in bed about 11p.m. and he started having pleuritic chest pain  when he breathes.  He denies any cough, shortness of breath.  The pain  radiates to his right shoulder.  There is no diaphoresis, palpitations,  nausea or vomiting.  No fever, no chills.  There has been no upper  respiratory tract infection recently.  The patient's pain was not going away  and he decided to come to the emergency department.  Initial evaluation in  the emergency department revealed cardiomegaly on chest x-ray.  He had a CT  scan of the chest to rule out PE.  There was no PE and it did come from  significant cardiomegaly with no pericardial effusion.  This patient has  been admitted for evaluation.   REVIEW OF SYSTEMS:  As in the HPI.  In addition, he has no had pedal edema  and there is no orthopnea or PND.  No dysuria, urgency, diarrhea or  vomiting, constipation.   PAST MEDICAL HISTORY:  Hyperlipidemia.   MEDICATIONS:  1.  Lipitor 20 mg p.o. daily.  2.  Celebrex.   ALLERGIES:  No known drug allergies.   SOCIAL HISTORY:  The patient does not smoke cigarettes, does not use alcohol  and does not use any illicit drugs.  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  He is married and lives with his wife.   FAMILY HISTORY:  No heart disease in the family.   PHYSICAL EXAMINATION:  VITAL SIGNS:  Temperature 98.3, blood pressure  145/71, pulse 61,  respiratory rate 28.  Respiratory rate improved to 20.  Oxygen saturation of 99%.  GENERAL:  The patient is obese, not in acute respiratory distress.  HEENT:  Normocephalic, atraumatic head.  Pupils are equal, round and  reactive to light.  Mucous membranes moist.  NECK:  No elevated JVD.  No thyromegaly.  LUNGS:  Clear clinically to auscultation.  CARDIOVASCULAR:  S1 and S2 regular.  No murmur, no gallop.  ABDOMEN:  Obese, soft, nontender.  No hepatosplenomegaly.  EXTREMITIES:  No pedal edema.  No calf tenderness.  Dorsalis pedis pulses  are palpable bilaterally.  SKIN:  Bilateral lower extremity psoriatic lesions.  CNS:  No focal neurological deficits.   INITIAL LABORATORY DATA:  Cardiac markers are the point of care, within  normal.  BNP 61, lipase 81.  Liver function tests within normal, except  bilirubin 0.4.  Creatinine 0.8, sodium 142, potassium 3.6, chloride 108,  glucose 146, BUN 9, bicarbonate 28.  D-dimer 0.57.   EKG:  Sinus bradycardia with heart rate of 58, deep S in V2 and V3.  Normal  intervals.   Chest x-ray:  No active pulmonary findings.   Chest CT scan:  No PE, marked cardiomegaly with bibasilar ASP disease,  atelectasis versus infection.   ASSESSMENT/PLAN:  [name] is a 50 year old African American male with  history of hyperlipidemia, presenting with pleuritic chest pain, chest CT  scan negative for pulmonary embolism, there is marked cardiomegaly.   1.  Pleuritic chest pain.  2.  Cardiomegaly.  3.  Hyperlipidemia.  4.  Obesity.  5.  Psoriasis both lower extremities.   PLAN:  1.  Obtain 2-D echocardiogram.  2.  Check urine drug screen.  3.  Aspirin 81 mg p.o. daily.  4.  Vicodin 1-2 p.o. q.4h. p.r.n.  5.  Serial cardiac enzymes.  6.  Will resume home medications.  7.  Check CBC today.       MBB/MEDQ  D:  12/06/2004  T:  12/06/2004  Job:  [id number]   cc:   Candyce Churn. Allyne Gee, M.D.  [address]  [address]  [place]  [place] [address]  Fax:  [phone]

## 2010-10-25 NOTE — Consult Note (Signed)
NAME:  [name], [name] NO.:  [id number]   MEDICAL RECORD NO.:  [id number]          PATIENT TYPE:  INP   LOCATION:  2011                         FACILITY:  [facility]   PHYSICIAN:  Mallory Shirk, MD     DATE OF BIRTH:  August 30, 1960   DATE OF CONSULTATION:  DATE OF DISCHARGE:                                   CONSULTATION   No dictation for this job.       GDK/MEDQ  D:  12/06/2004  T:  12/06/2004  Job:  [id number]

## 2010-10-25 NOTE — H&P (Signed)
NAME:  [name], [name] NO.:  [id number]   MEDICAL RECORD NO.:  [id number]          PATIENT TYPE:  INP   LOCATION:  0107                         FACILITY:  [facility]   PHYSICIAN:  Lorain Childes, M.D. LHCDATE OF BIRTH:  10-06-1960   DATE OF ADMISSION:  11/19/2005  DATE OF DISCHARGE:                                HISTORY & PHYSICAL   PRIMARY CARDIOLOGIST:  Maisie Fus C. Wall, M.D.   PRIMARY CARE PHYSICIAN:  Robyn N. Allyne Gee, M.D.   CHIEF COMPLAINT:  Chest pain.   HISTORY OF PRESENT ILLNESS:  The patient is a 50 year old gentleman with  history of type 1 aortic dissection complicated with aortic insufficiency  and dissection to the right coronary artery.  He is status post superior  AVR.  He now comes to the emergency room with complaints of chest pain.  The  patient reports he has sharp stabbing chest pain radiating down his left arm  and associated with lightheadedness.  It began around 11 a.m. or noon this  afternoon and has persisted throughout the day.  The pain is similar to the  pain he had at the time of his dissection.  It is rated as 6/10, not changed  with nitroglycerin.  He reports lightheadedness, but denies any syncope, no  weakness or arm or leg.  He denies any shortness of breath, nausea and  vomiting, and no diaphoresis.  He has had no change in his pain with  activity.  He last had CT for surveillance done a week ago which reported  that his graft was stable.  He reports compliance with his medications and  improved blood pressure control.   PAST MEDICAL HISTORY:  1.  Type 1 aortic dissection in June of 2006, status post repair.  He has a      mechanical aortic valve with a St. Jude.  He has a graft in place.  He      had a prior 7 cm ascending aortic aneurysm which was dissected involving      the aortic valve and right coronary artery.  He underwent a cardiac      catheterization at the time of his dissection and they were unable to  intubate the ostium.  They did note the flap of the dissection near the      origin of the RCA.  Also related to his aortic dissection, his course is      complicated with postoperative SVT.  2.  Hyperlipidemia.  3.  Obesity.  4.  Psoriasis.  5.  Hypertension.  6.  Status post right corneal transplant secondary to trauma with visual      disturbance.  7.  Hyperglycemia for which he controls with diet.  8.  Obstructive sleep apnea.  He does not wear his CPAP.   MEDICATIONS:  1.  Cyclobenzaprine 10 mg p.o. t.i.d. p.r.n.  2.  Warfarin 7.5 mg p.o. q.h.s.  and 1-1/2 tablets p.o. q. Monday.  3.  Toprol 50 mg p.o. q.h.s.  4.  Cymbalta 60 mg p.o. daily.  5.  Welchol 625 mg two tablets p.o. b.i.d.   ALLERGIES:  No known drug allergies.   SOCIAL HISTORY:  He lives here in [place] with his wife and family.  He  makes flavorings.  He denies any significant tobacco history.  He quit  greater than 17 years ago and smoked sporadically prior to that an  occasional cigarette.  He denies any alcohol for 17 years, no drugs, no  herbal medications.  He walks two times per week and denies chest pain with  activity.   FAMILY HISTORY:  Mother alive at the age of 50 and has hypertension.  Father  died related to diabetes.  He has a brother who has diabetes.  One brother  died at the age of 21 with an MI.  He was 6 feet 9 inches and 585 pounds, so  he felt it was obesity related.  He has a sister who died at the age of 75  with cervical cancer and one sister who has diabetes and another sister who  has hypertension and is alive.  He has a son who has a slow heart rhythm, he  is status post a pacemaker.   REVIEW OF SYSTEMS:  He denies any fevers or change.  He has chronic  headaches which have been evaluated with CT's which have been negative.  He  denies any change in his vision recently.  He reports chest pain as stated  in the HPI.  No shortness of breath, dyspnea on exertion, no orthopnea, no   PND, and no lower extremity edema.  He reports occasional palpitations.  No  syncope.  Occasional coughing, but no wheezing and no sputum production.  He  denies any urinary symptoms, no hematuria, dysuria.  No nausea and vomiting,  diarrhea, and no bright red blood per rectum.  No melena and no hematemesis.  All other systems are negative.   PHYSICAL EXAMINATION:  VITAL SIGNS:  Temperature 97.5, pulse 57,  respirations 20, blood pressure 130/68, saturating 98% on 2 liters nasal  cannula.  Blood pressure in the left arm is 135/82, he has a rate of 50.  Blood pressure in the right arm is 109/66 with a rate of 51.  HEENT:  He is normocephalic and atraumatic.  NECK:  JVP is difficult to discern secondary to body habitus.  He has 2+  carotid upstroke.  There are no bruits.  LUNGS:  Clear to auscultation bilaterally.  HEART:  Normal S1, mechanical valve.  No murmurs appreciated.  Regular  rhythm.  ABDOMEN:  Obese, positive bowel sounds throughout, nontender.  No  organomegaly.  EXTREMITIES:  No edema, 2+ pulses.  NEUROLOGY:  He is alert and oriented x3.  Cranial nerves are grossly intact  except for decreased vision as stated secondary to his eye trauma.  His  strength is appropriate in all extremities.   Chest x-ray; pending.   EKG; rate 63, normal sinus rhythm.  He has RSR prime in V1. His PR interval  is 184, QRS 100, QTC 413.  He has Q waves inferiorly.  He has no ischemic  changes.  This is similar to prior EKG.   LABORATORY DATA:  Hematocrit is 36, white count 6.8, platelets 215,  creatinine 0.9, potassium 4.1.  CK-MB 1.8, troponin less than 0.05,  myoglobin 55.2.  INR is 1.9.   ASSESSMENT:  The patient is a 50 year old gentleman with prior ascending  aortic root aneurysm with dissection complicated with aortic insufficiency  and dissection of the right coronary who is status post repair of aortic valve who presents with  chest pain reminiscent of his prior dissection pain.    Problem 1.  Chest pain.  He appears hemodynamically stable currently.  His  recent CT showed that his repair had no leak, but his symptoms are new and  worrisome.  I will obtain a stat CT to rule out graft leak or insufficiency.  We will continue to control his blood pressure.  We checked blood pressure  in both arms and there is a differential which will be assessed further by  his CT scan this evening.  Once the CT is completed, if it does not show any  dissection or problem at his graft site, then we will admit him to  telemetry, cycle his cardiac enzymes, and follow his EKG and evaluate him  for ischemic etiology due to his risk factors.  Of note, he did have a  catheterization at the time of his dissection, however, his coronaries were  not well visualized due to the dissecting flap.  We may do a cardiac  catheterization during this admission.   Problem 2.  Hypertension.  We will continue his Toprol.  He is on a  nitroglycerin drip currently.  We will monitor his blood pressure closely  and keep it tightly controlled.   Problem 3.  Aortic valve replacement.  Once the CT is complete, we will  continue his anticoagulation if there is no evidence of a leak.  We will  have pharmacy adjust the dose and will also place him on heparin since he is  subtherapeutic.   Problem 4.  Diabetes.  We will check a hemoglobin A1C.  He has been on no  medications and has been diet controlled.  We will follow his glucose.           ______________________________  Lorain Childes, M.D. [facility]     CGF/MEDQ  D:  11/20/2005  T:  11/20/2005  Job:  [id number]

## 2010-10-25 NOTE — Cardiovascular Report (Signed)
NAME:  [name], MATH NO.:  [id number]   MEDICAL RECORD NO.:  [id number]          PATIENT TYPE:  INP   LOCATION:  2303                         FACILITY:  [facility]   PHYSICIAN:  Salvadore Farber, M.D. LHCDATE OF BIRTH:  07/14/1960   DATE OF PROCEDURE:  12/06/2004  DATE OF DISCHARGE:                              CARDIAC CATHETERIZATION   PROCEDURE:  Aortic root angiogram.   INDICATIONS:  [name] is a 50 year old gentleman without significant  past medical history who presented with chest pain.  Echocardiogram  demonstrated profound dilation of the aortic root, with at least moderate  and perhaps severe aortic insufficiency.  He has had waxing and waning chest  discomfort which is ongoing at present.  Dr. Laneta Simmers has asked me to perform  coronary angiography and aortic root angiography as a prelude to urgent  surgical exploration and repair of possible ruptured aneurysm.   PROCEDURAL TECHNIQUE:  Informed consent was obtained.  Under 1% lidocaine  local anesthesia, a 5 French sheath was placed in the right common femoral  artery using the modified Seldinger technique.  Watching carefully under  angiographic guidance, a J-wire was advanced into the aortic root.  We began  by attempting to selectively engage the left coronary artery using a JL-4  catheter.  Despite multiple catheters, I was unable to selectively engage  it.  I then proceeded to advance the pigtail catheter into the aortic root.  Arch aortography was performed.  This confirmed profound dilation of the  aortic root as well as some degree of aortic insufficiency and suggested a  dissection flap near the origin of the right coronary artery.  Despite  attempts at multiple catheters, I remained unable to selectively engage  either coronary artery.  After further discussion with Dr. Laneta Simmers, the  procedure was aborted in favor of transfer urgently to the operating room  for exploration and repair.   COMPLICATIONS:  None.   ESTIMATED BLOOD LOSS:  Minimal.   FINDINGS:  1.  Profoundly dilated aortic root and ascending aorta.  2.  Probable dissection flap near the origin of the right coronary artery,      without contrast staining of a false lumen.   IMPRESSION/RECOMMENDATIONS:  The patient has ascending aortic aneurysm with  probable associated dissection which is symptomatic.  He is transferred  emergently to the operating room from the catheterization lab.       WED/MEDQ  D:  12/06/2004  T:  12/06/2004  Job:  [id number]

## 2010-10-25 NOTE — H&P (Signed)
NAME:  [name], [name] NO.:  [id number]   MEDICAL RECORD NO.:  [id number]          PATIENT TYPE:  INP   LOCATION:  3711                         FACILITY:  [facility]   PHYSICIAN:  Rollene Rotunda, MD, FACCDATE OF BIRTH:  Apr 28, 1961   DATE OF ADMISSION:  06/20/2006  DATE OF DISCHARGE:  06/21/2006                              HISTORY & PHYSICAL   PRIMARY CARE PHYSICIAN:  Robyn N. Allyne Gee, M.D.   REASON FOR PRESENTATION:  Evaluate patient with chest pain and previous  aortic dissection.   HISTORY OF PRESENT ILLNESS:  The patient is very pleasant 50 year old  gentleman with a history of an aortic dissection diagnosed in June 2006.  He underwent repair of this and placement of a mechanical aortic valve.  (I cannot find the surgical description to know whether or not he had  his coronaries re-suspended.  I do see mention of a perioperative  inferior infarct felt to be related to dissection of the coronary.  However, I do not see confirmation of this.   The patient has had couple of hospitalizations since that time.  I have  report from one in June 2007.  He had some chest discomfort and ruled  out for myocardial infarction.  He did have an outpatient stress  perfusion study which demonstrated EF of 50% with no ischemia.   The patient had been doing well since that time.  However, yesterday he  had bent down at work.  When he came back up, he was dizzy.  He then  developed 6/10 substernal chest discomfort.  This has been constant  throughout the day, waxing and waning.  He said it is somewhat similar  to his previous discomfort, heavy and sharp.  He had a little nausea on  Thursday but has had no other associated symptoms since the time of his  discomfort.  He had no palpitations, presyncope or syncope.  He had no  radiation to his neck or to his arms.  He is comfortable now in the ER  status post morphine.  He presented to the emergency room and had no  acute ST-segment  changes on his EKG.  He was noted to have a CT with  questionable mild lymphadenopathy, but no evidence of dissection and no  evidence of pulmonary embolism.  They do report that the pulmonary  arteries were well opacified.  The patient had no fevers or chills.  He  has had no immobilization with long illnesses or car rides.  He has had  no calf tenderness or swelling.   PAST MEDICAL HISTORY:  1. Hyperlipidemia.  2. Hypertension.  3. Borderline diabetes mellitus.  4. Psoriasis.  5. Obstructive sleep apnea.  6. Type 1 aortic dissection status post aortic root replacement and      aortic valve replacement with St. Jude's mechanical.  7. Corneal lens transplant.  8. Mildly reduced ejection fraction (50%).   ALLERGIES:  None.   MEDICATIONS:  1. Welchol 625 mg b.i.d.  2. Warfarin.  3. Toprol XL 50 mg daily.  4. Aspirin.  5. Cymbalta.  6. Lyrica.  7. Zetia.   SOCIAL HISTORY:  The patient lives in [place] with his wife.  He  works in a lab.  Does not drink alcohol or smoke cigarettes.   FAMILY HISTORY:  Contributory for first-degree relatives with a brother  dying of an MI at age 14.   REVIEW OF SYSTEMS:  Positive for nocturia, left leg numbness, mild  depression, cold intolerance, gastroesophageal reflux disease, headache,  syncope last year.  Negative for all other systems.   PHYSICAL EXAMINATION:  GENERAL:  The patient is in no distress.  VITAL SIGNS: Blood pressure 111/63, heart rate 58 and regular,  temperature 98.4.  HEENT: Eyelids unremarkable, pupils equal, round, reactive to light.  Fundi not visualized. Oral mucosa unremarkable.  NECK: No jugular venous distension at 45 degrees, carotid upstroke brisk  and symmetrical.  No bruits, thyromegaly.  LYMPHATICS: No cervical, axillary, inguinal adenopathy.  LUNGS: Clear to auscultation bilaterally.  BACK: No costovertebral angle tenderness.  CHEST: Well-healed sternotomy scar.  HEART: PMI not displaced or sustained,  mechanical S1, S2 within normal.  No S3, no S4, 2/6 apical systolic murmur radiating slightly at the  aortic outflow tract, no diastolic murmurs.  ABDOMEN: Obese, positive bowel sounds, normal in frequency and pitch.  No bruits, rebound, guarding or midline pulsatile mass.  No hepatomegaly  and no splenomegaly.  SKIN: No rashes, no nodules.  EXTREMITIES:  2+ pulses throughout, no edema, cyanosis or clubbing.  NEUROLOGIC:  Oriented to person, place, time. Cranial nerves II-XII  grossly intact. Motor grossly intact.   EKG: Sinus bradycardic, rate 46, axis within normal limits, borderline  first-degree AV block, no acute ST-T wave changes.   Labs: WBC 9.5, hemoglobin 12.5. Sodium 137, potassium 4.7, BUN 9,  creatinine 0.9. INR 3.1. Point-of-care markers negative x2.   Chest x-ray: Cardiomegaly without failure.   CT as above.   ASSESSMENT/PLAN:  1. Chest pain: The patient chest discomfort is somewhat atypical for      angina.  It is similar to previous dissection, but there is no      evidence of this.  It is still waxing and waning at moderate      intensity, though he is in no distress.  At this point, I am going      to put him in for observation and continue to cycle enzymes.  Treat      with morphine.  Will keep him on telemetry.  If he rules out, I      would not feel strongly about a catheterization as he has had a      negative stress perfusion study within the past year.  However, we      may be forced to do this if he continues to have discomfort and      certainly if he has any elevated cardiac markers.  2. Abnormal CT.  I will pass this onto Dr. Allyne Gee and to Dr. Daleen Squibb and      also send a letter.  I also discussed with      the patient.  He will need to have CT followed up in 3-4 months to      assess stability of his adenopathy.  3. Dyslipidemia.  Continue medications as listed.  4. Sleep apnea.  He will be prescribed CPAP.      Rollene Rotunda, MD, [facility]   Electronically Signed     JH/MEDQ  D:  06/20/2006  T:  06/21/2006  Job:  [id number]

## 2010-10-25 NOTE — Discharge Summary (Signed)
NAME:  LISTER, BRIZZI NO.:  [id number]   MEDICAL RECORD NO.:  [id number]          PATIENT TYPE:  EMS   LOCATION:  ED                           FACILITY[facility]  [facility]   PHYSICIAN:  Olga Millers, M.D. LHCDATE OF BIRTH:  1960-11-21   DATE OF ADMISSION:  11/19/2005  DATE OF DISCHARGE:  11/21/2005                                 DISCHARGE SUMMARY   PRIMARY CARDIOLOGIST:  Maisie Fus C. Wall, M.D.   PRIMARY CARE PHYSICIAN:  Robyn N. Allyne Gee, M.D.   PRINCIPAL DIAGNOSIS:  Chest pain.   OTHER DIAGNOSES:  1.  History of type 1 aortic dissection, status post repair with mechanical      aortic valve replacement, November 12, 2004.  2.  Hyperlipidemia.  3.  Morbid obesity.  4.  Psoriasis.  5.  Status post right corneal transplant secondary to trauma.   ALLERGIES:  NO KNOWN DRUG ALLERGIES.   PROCEDURES:  CT of the chest   HISTORY OF PRESENT ILLNESS:  A 50 year old male with a prior history of type  1 aortic dissection involving the aortic valve as well as the right coronary  artery, status post repair and a St. Jude's mechanical aortic valve  replacement with subsequent chronic anticoagulation.  He was in his usual  state of health until approximately 11 a.m. on November 19, 2005, when he 2020  began to experience a sharp stabbing pain in his chest, radiating down the  left arm, similar in character to what he experienced with his dissection.  There was no exertional component to his chest pain.  Of note, he had a CT  scan of his chest approximately 1 week ago for surveillance of the aortic  graft which showed no evidence of complication.  In the ER, a STAT chest CT  was performed which revealed no evidence of dissection.  Decision was made  to admit him for further evaluation.   HOSPITAL COURSE:  His pain persisted at a lower level and was reproducible  with certain movements of his upper body.  His cardiac markers were negative  and ECG was without any acute changes.  As a result  it was felt unlikely  that this was ischemic in nature and he is being discharged home today in  satisfactory condition.  He we will have a followup exercise Myoview at  [facility] cardiology next week and will subsequently follow up with Dr. Valera Castle.   The patient was noted to have and a sub therapeutic INR while hospitalized.  His Coumadin was managed by pharmacy with heparin bridging.  He did receive  an additional 12.5 mg dose of Coumadin on the evening of June 13 and per  pharmacy recommendation will be discharged home today on his previous home  dose which was 7.5 mg of Coumadin q.h.s. with the exception of Mondays, when  he takes 11.25 mg.   DISCHARGE LABORATORY:  Hemoglobin 13, hematocrit 39.5, WBC 7.9, platelets  201, MCV 89.7.  Sodium 136 potassium 3.6, chloride 101, CO2 25, BUN 5,  creatinine 0.03, glucose 78.  PT 25.9, INR 2.4, total bilirubin 0.8,  alkaline phosphatase 69, AST  13, ALT 25, albumin 3.2.  Cardiac markers are  negative x4.  Calcium 8.5.  Magnesium 2.1.  Hemoglobin A1c 6.1.  TSH 1.520.  A tox screen was negative.   DISPOSITION:  The patient is being discharged home today in good condition.   FOLLOWUP PLANS/APPOINTMENTS:  1.  He will follow up with Dr. Valera Castle in approximately 2 weeks.  2.  We will also schedule him for an outpatient Myoview functional study to      rule out ischemia next week.  3.  We will arrange for Coumadin Clinic followup next week.  4.  He is asked to follow up with his primary care physician, Dr. Allyne Gee,      in the next 3-4 weeks.   DISCHARGE MEDICATIONS:  1.  Coumadin as previously prescribed.  2.  Flexeril 10 mg t.i.d. p.r.n.  3.  Toprol XL 50 mg every day.  4.  Cymbalta 60 mg every day.  5.  Welchol 625 mg, 2 tablets b.i.d.   OUTSTANDING LABORATORY STUDIES:  None.   Duration discharge encounter, 40 minutes including physician time.      Ok Anis, NP    ______________________________  Olga Millers,  M.D. [facility]    CRB/MEDQ  D:  11/21/2005  T:  11/21/2005  Job:  [id number]   cc:   Candyce Churn. Allyne Gee, M.D.  Fax: [phone]

## 2010-10-25 NOTE — H&P (Signed)
NAME:  [name], [name] NO.:  [id number]   MEDICAL RECORD NO.:  [id number]          PATIENT TYPE:  INP   LOCATION:  3728                         FACILITY:  [facility]   PHYSICIAN:  Renato Battles, M.D.     DATE OF BIRTH:  08/15/1960   DATE OF ADMISSION:  02/02/2005  DATE OF DISCHARGE:                                HISTORY & PHYSICAL   REASON FOR ADMISSION:  Chest pain.   PHYSICIANS:  Primary care physician: Candyce Churn. Allyne Gee, M.D.  Cardiologist: Jesse Sans. Wall, M.D.  Cardiothoracic surgeon: Evelene Croon, M.D.   HISTORY OF PRESENT ILLNESS:  The patient is a very pleasant 50 year old  African-American male who started experiencing middle chest tightness along  with tingling and numbness of the left upper extremity and left lower  extremity during church service when he was flattening and dancing at the  prayers.  No shortness of breath, nausea or vomiting, no sweating.  Positive  for headache and dizziness.  Tightness continued for a while, and got him  concerned to call EMS and come to the emergency room.   REVIEW OF SYSTEMS:  CONSTITUTIONAL:  No fever, chills, or night sweats.  No  weight changes.  GI: No nausea, vomiting, diarrhea, constipation.  GU:  No  dysuria or hematuria, or urinary retention.  CARDIOPULMONARY:  Positive for  chest pressure but no shortness of breath, no orthopnea, no PND, no cough.   PAST MEDICAL HISTORY:  1.  Type 1 aortic dissection back in June 2006.  This was repaired by Dr.      Laneta Simmers.  2.  Mechanical aortic valve replacement with St. Jude valve.  Again, this      was part of the aortic dissection.  3.  Hyperlipidemia.  4.  Obesity.  5.  Psoriasis, worse at lower extremities.  6.  Right corneal transplant secondary to trauma.  The patient has limited      vision in left eye.  7.  Hyperglycemia but no history of diabetes.  8.  Probable perioperative inferior MI.  9.  Postoperative supraventricular tachycardia.  10. Obstructive sleep  apnea.   PAST SURGICAL HISTORY:  1.  Right knee surgery.  2.  Above-mentioned aortic dissection repair.  3.  Cardiac catheterization in 2006 to investigate the aortic dilation .   SOCIAL HISTORY:  No tobacco, alcohol, or drugs.  He is married and lives  with his wife.   FAMILY HISTORY:  Negative for coronary disease.   ALLERGIES:  No known drug allergies.   HOME MEDICATIONS:  1.  Lipitor 20 mg p.o. daily.  2.  Toprol XL 50 mg p.o. daily.  3.  Aspirin 81 mg p.o. daily.  4.  Coumadin per protocol.  5.  Ultram 50 mg 1 to 2 tablets p.o. q.4-6h. p.r.n. for pain.   PHYSICAL EXAMINATION:  GENERAL:  The patient is alert and oriented x3 in no  acute distress.  VITAL SIGNS:  Temperature 98.3, heart rate 54, respiratory rate 20, blood  pressure 97/53.  O2 saturation 98% on room air.  HEENT:  Head is atraumatic and normocephalic.  Left pupil is round  and  reactive to light and accommodation.  Right pupil is distorted and fixed.  Extraocular movements intact bilaterally.  NECK:  No lymphadenopathy, no thyromegaly, and no JVD.  CHEST:  Clear to auscultation bilaterally.  No wheezing, rales, or rhonchi.  HEART:  Regular rate and rhythm.  No murmurs.  ABDOMEN:  Soft, nontender, nondistended.  Mild tenderness in the epigastric  area with discomfort resembling chest discomfort he came in with.  EXTREMITIES:  No cyanosis, clubbing, or edema.  The patient has some lesions  on the lower extremities.   STUDIES:  CBC showed hemoglobin 10.3, hematocrit 31.3, normal white count  and platelets, MCV 82.8.  Differential shows elevated monocytes 12%,  otherwise normal.  Point-of-care cardiac enzymes were all within normal.  D-  dimer was elevated at 2.4.  PT elevated at 25.5, INR therapeutic at 2.3, PTT  elevated at 41.   Head CT showed no abnormalities.   ASSESSMENT AND PLAN:  1.  Chest pain.  Low probability for cardiac etiology given the nature of      the pain and being anticoagulated.  However,  we are going to rule out      myocardial infarction with cardiac enzymes q.8h. x3.  I am rather more      concerned about some malfunction of the repair and am going to order      chest CT.  A third possibility is pulmonary embolus which is also very      unlikely given the fact that he is fully anticoagulated.  I am going to      try to obtain the chest CT while the patient is in the emergency room      prior to being transferred to the floor.  2.  Numbness, left upper and left lower extremities.  There are no changes      in head CT scan.  This could be suggestive of transient ischemic attack.      I am going to order carotid Doppler.  Continue aspirin low dose.  3.  Mechanical valve.  Continue Coumadin per pharmacy protocol.  4.  Bradycardia.  I am going to decrease his dose of Toprol and hold today's      dose.  5.  Low blood pressure.  Again asymptomatic.  Just repeat and monitor.      Renato Battles, M.D.  Electronically Signed     SA/MEDQ  D:  02/02/2005  T:  02/02/2005  Job:  [id number]   cc:   Candyce Churn. Allyne Gee, M.D.  [address]  [address]  [place]  [place] [address]  Fax: [phone]   Jesse Sans. Wall, M.D.  [address]  [address]  [place]  [place] [address]   Evelene Croon, M.D.  [address]  [place]  [place] [address]  Fax: [phone]

## 2010-10-25 NOTE — Assessment & Plan Note (Signed)
[facility]                            CARDIOLOGY OFFICE NOTE   NAME:[name], [name]                  MRN:          [id number]  DATE:07/10/2006                            DOB:          July 23, 1960    [name] returns today after being discharged from the hospital with  chest pain.  It was felt to be atypical.  His cardiac enzymes were  negative x3.   His chest CT in the emergency room demonstrated some right peritracheal  lymph nodes measuring 9.6 x 13.7 mm at the level of the thoracic inlet.  Apparently this has been seen on a previous scan, though I have a CT  scan from our office, November 20, 2005, which does not mention this.   It was suggested by Radiology to follow this up in 3 months.   His other problems are listed on the discharge summary.   He is status aortic valve replacement with a history of a type 1 aortic  dissection status post St. Jude mechanical valve June 2006 with  reimplantation of his coronary arteries.  He is maintained on  anticoagulation.   MEDICATIONS:  1. Coumadin as directed.  2. Toprol XL 50 mg a day.  3. Aspirin 81 mg a day.  4. Zetia 10 mg a day.  5. Welchol 625 two b.i.d.  6. Lyrica 75 mg a day.   His blood pressure is 139/71 today.  His pulse is 55 and regular.  He is  in no acute distress.  His weight is 295, down 21!  HEENT:  Normocephalic and atraumatic.  PERRLA.  Extraocular movement are  intact.  He has an exotropia on the right eye.  NECK:  Supple.  Carotid upstrokes are equal bilaterally without bruits.  There is no JVD.  Thyroid is not enlarged.  LUNGS:  Clear.  HEART:  Reveals a regular rate and rhythm.  His S2 is prosthetic.  There  is no aortic insufficiency heard.  There is no rub.  ABDOMINAL EXAM:  Protuberant with good bowel sounds.  Organomegaly could  not be assessed.  EXTREMITIES:  Reveal no significant edema.  Pulses are present.   I have had a long talk with Mr. Mccarney and his  wife today.  I have  recommended a CT scan in our office the 1st of April, which I will  schedule.  I have told him that if he has any fever, chills, night  sweats or hemoptysis, to notify us right away.   I will plan on seeing him after the CT scan.     Thomas C. Daleen Squibb, MD, [facility]  Electronically Signed    TCW/MedQ  DD: 07/10/2006  DT: 07/10/2006  Job #: [id number]   cc:   Candyce Churn. Allyne Gee, M.D.

## 2010-10-25 NOTE — Consult Note (Signed)
NAME:  Lance, [name] NO.:  [id number]   MEDICAL RECORD NO.:  [id number]          PATIENT TYPE:  INP   LOCATION:  2011                         FACILITY:  [facility]   PHYSICIAN:  Jesse Sans. Wall, M.D.   DATE OF BIRTH:  18-Jun-1960   DATE OF CONSULTATION:  12/06/2004  DATE OF DISCHARGE:                                   CONSULTATION   SUMMARY OF HISTORY:  [name] is a 50 year old African-American male who  was admitted through [facility] Emergency Room yesterday evening secondary  to chest discomfort.  He stated that at 11 p.m. last night while lying in  bed he gradually developed an anterior chest throbbing associated with a  right shoulder numbness as well as some shortness of breath.  He also felt  that the discomfort was particularly worse with swallowing, talking, and  taking a deep breath.  He did not have any associated nausea, vomiting, or  diaphoresis.  He gave it a 10+ on a scale of 0-10.  It did not change with  movement and he also noticed some water brash.  Over the next several hours  he attempted to shift position with little relief.  Due to continue with  discomfort he presented to [facility] Emergency Room for further evaluation.   Initial blood pressure was 145/71 with a pulse of 61, respirations 28, 96%  saturation on room air, and afebrile.  He was admitted to room 2000 where  initial EKGs, one enzyme and two cardiac ER markers ruled out myocardial  infarction.  D-dimer was slightly elevated at 0.53 and a chest CT ruled out  pulmonary embolism.  Chest x-ray showed cardiomegaly and some pulmonary  vascular congestion.  Marked cardiomegaly was also shown on the CT scan.  An  echocardiogram was obtained on the mid morning of the 11th.  While being  reviewed by Dr. Antoine Poche Dr. Daleen Squibb was called stat to evaluate the patient  secondary to the possibility of a thoracic aortic aneurysm.   The patient denies any prior episodes or injuries.  He states that  the  discomfort is now a 7 on scale of 0-10 and it has been constant since the  onset.   PAST MEDICAL HISTORY:  No known drug allergies.   MEDICATIONS PRIOR TO ADMISSION:  1.  Lipitor 20 mg p.o. q.h.s.  2.  Celebrex 200 mg daily.   PAST MEDICAL HISTORY:  1.  Notable for fatty tissue removed on his chest as a child.  2.  Right knee surgery.  3.  Cornea transplant.  4.  He had an EGD in May 2002 that was negative.  5.  He has also been treated for hyperlipidemia by his primary care      physician.  Next check is due in July 2006.  6.  He also describes sleep apnea for which he was prescribed a CPAP but he      does not use this.  7.  He denies hypertension, diabetes, CVA, myocardial infarction, COPD,      bleeding dyscrasias, or thyroid dysfunction.   SOCIAL HISTORY:  He [place] in [place] with his wife.  He does not have  any biological children.  He has one adopted son and one adopted daughter  who has hydrocephalus.  He has eight step-grandchildren.  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  He  has not smoked since 1991.  Prior to that he smoked one to two cigarettes  per day for 10 years.  He has not had any alcohol since 1991.  He denies any  illicit drug use, herbal medications.  He does not watch a specific diet and  he has not exercised in quite a while.   FAMILY HISTORY:  His mother is alive at the age of 6 with a history of  diabetes and cervical cancer.  His father died at the age of 52 with  diabetes complications.  He has two brothers, one of which has diabetes, two  sisters, one of which has diabetes.  One brother is deceased at the age of  64 with possible myocardial infarction and a history of diabetes.  He has  two sisters deceased, one at the age of 93, the other was burned.   REVIEW OF SYSTEMS:  Notable for glasses, hearing loss, two dental crowns,  psoriasis, generalized fatigue particularly noticeable  in the last several  weeks, bilateral knee arthralgias, GERD in the addition to the above.   PHYSICAL EXAMINATION:  GENERAL:  Well-nourished, well-developed, African-  American male slightly obese in no apparent distress.  VITAL SIGNS:  Temperature 97.2, blood pressure 154/83, pulse 66,  respirations 20, weight 290.8 pounds, 98% saturation on 2 L.  Blood pressure  by me in the left arm is 124/72, in the right 133/74 utilizing a large cuff.  Patient appears uncomfortable.  HEENT:  Unremarkable except for glasses.  NECK:  Negative thyromegaly, masses, or JVD.  He does have bilateral carotid  bruits which are soft.  CHEST:  Symmetrical excursion.  Lung sounds were clear to auscultation.  HEART:  PMI is not displaced.  Regular rate and rhythm.  He has a short 1/6  systolic murmur best appreciated at the left sternal border.  All pulses are  symmetrical and intact without femoral or abdominal bruits.  SKIN:  He has psoriasis on both lower extremities.  ABDOMEN:  Obese.  Bowel sounds present without organomegaly, masses, or  tenderness.  EXTREMITIES:  Negative clubbing, cyanosis, edema.  Pedal pulses are 2+.  Femorals are 2+ without bruits.  MUSCULOSKELETAL:  Unremarkable.  NEUROLOGIC:  Unremarkable.   EKG shows sinus bradycardia with a rate of 58, normal axis, normal  intervals.  No old EKGs for comparison.   LABORATORIES:  In addition to above shows an H&H of 10 and 29, normal  indices, platelets 104, WBC 7.6.  Sodium 142, potassium 3.6, BUN 9,  creatinine 0.8.  Normal LFTs.  BNP was 61.  D-dimer was elevated 0.53.   IMPRESSION:  1.  Prolonged atypical chest discomfort with probable thoracic aortic      aneurysm involving the aortic valve.  2.  Hypertension which is improved.  3.  Normocytic anemia, thrombocytopenia, history as previously.   DISPOSITION:  Lipids and TSH are pending at the time of this dictation.  Dr. Daleen Squibb reviewed the patient's history, spoke with and examined the  patient and  agrees with above.  Patient will undergo an urgent cardiac catheterization  in the next hour to evaluate for a potential coronary artery disease.  His  admitting physicians with [facility] team  C have been notified.  CV[facility] has  also been notified by the [facility] C team; however, they will wait to hear  from the  cardiologists after his cardiac catheterization.  I have also informed his  wife and sister and they are on their way back to the hospital.  Further  treatment will be decided post his cardiac catheterization.  Dr. Daleen Squibb  reviewed the risks, procedures, and benefits of the cardiac catheterization  to the patient.       EW/MEDQ  D:  12/06/2004  T:  12/06/2004  Job:  [id number]   cc:   Candyce Churn. Allyne Gee, M.D.  [address]  [address]  [place]  [place] [address]  Fax: [phone]

## 2010-10-25 NOTE — Procedures (Signed)
NAME:  [name], [name] NO.:  [id number]   MEDICAL RECORD NO.:  [id number]          PATIENT TYPE:  OUT   LOCATION:  SLEEP CENTER                 FACILITY[facility]  [facility]   PHYSICIAN:  Marcelyn Bruins, M.D. [facility] DATE OF BIRTH:  07/04/1960   DATE OF STUDY:                              NOCTURNAL POLYSOMNOGRAM   INDICATION FOR STUDY:  Hypersomnia with sleep apnea.   EPWORTH SLEEPINESS SCORE:  Is 23.   MEDICATIONS:   SLEEP ARCHITECTURE:  The patient had a total sleep time of 337 minutes with  adequate REM but decreased slow wave sleep.  Sleep onset latency was normal  and REM onset was fairly rapid at 47 minutes.  Sleep efficiency was fairly  good at 95%.   RESPIRATORY DATA:  The patient underwent split night protocol, where he was  found to have 137 obstructive and central events in the first 129 minutes of  sleep.  This gave him a respiratory disturbance index of 64 events per hour  during the first half of the night.  The events were not positional but loud  snoring was noted throughout.  By protocol, the patient was then placed on a  large Ultra Mirage full face mask and ultimately titrated to a final  pressure of 14-cm with good control of his obstructive events, central  events, as well as snoring.   OXYGEN DATA:  There was O2 desaturation as low as 74% with the patient's  obstructive events.   CARDIAC DATA:  No clinically significant cardiac arrhythmias.   MOVEMENT-PARASOMNIA:  There were no clinically significant events.   IMPRESSIONS-RECOMMENDATIONS:  Split night study reveals severe obstructive  and central sleep apnea with a respiratory disturbance index of 64 events  per hour and oxygen desaturation as low as 74%.  The patient was then placed  on a large Ultra Mirage full face mask and titrated to an optimal pressure  of 14-cmH2O.           ______________________________  Marcelyn Bruins, M.D. [facility]  Diplomate, American Board of Sleep  Medicine     KC/MEDQ   D:  01/02/2006 16:49:07  T:  01/02/2006 16:10:96  Job:  [id number]

## 2010-10-25 NOTE — Discharge Summary (Signed)
[name], BOWNS NO.:  [id number]   MEDICAL RECORD NO.:  [id number]          PATIENT TYPE:  INP   LOCATION:  2038                         FACILITY:  [facility]   PHYSICIAN:  Evelene Croon, M.D.     DATE OF BIRTH:  [birth year]   DATE OF ADMISSION:  12/06/2004  DATE OF DISCHARGE:  12/12/2004                                 DISCHARGE SUMMARY   ADMISSION DIAGNOSIS:  Pleuritic chest pain   DISCHARGE/SECONDARY DIAGNOSES:  1.  A 7-cm ascending aortic aneurysm with acute type 1 aortic dissection      status post status post repair and aortic valve replacement.  2.  Hyperlipidemia.  3.  Obesity.  4.  Psoriasis of bilateral lower extremities.  5.  History of right knee surgery.  6.  Cornea transplant.  7.  Fatty tissue removal removal on his chest as a child.  8.  EGD in May of 2002 which was negative.  9.  Obstructive sleep apnea for which she had been prescribed CPAP, but is      noncompliant.  10. No known drug allergies.  11. Hyperglycemia probable new diagnosis of diabetes mellitus type 2      (hemoglobin A1c 6.1.  12. Postoperative burst of supraventricular tachycardia.  13. Probable perioperative inferior myocardial infarction versus      pericarditis   PROCEDURES:  1.  December 06, 2004 emergency median sternotomy, extracorporal circulation,      replacement of aortic valve and ascending aortic dissecting aneurysm      using a 27-mm St. Jude mechanical valve conduit with reimplantation of      both coronary arteries by Dr. Evelene Croon.  2.  December 06, 2004 cardiac catheterization by Dr. Randa Evens showing      profoundly dilated aortic root and ascending aorta with probable      dissection flap near the origin of the right coronary artery, without      contrast staining of a false lumen unsuccessful selective engagement of      either coronary artery  3.  Chronic anticoagulation therapy for mechanical St. Jude aortic valve  4.  Postoperative leukocytosis  without fever, improved.   BRIEF HISTORY:  [name] is a 50 year old African-American male with past  medical history of obesity and hyperlipidemia. At approximately 11:00 p.m.  on June 29 he began having pleuritic chest pain when he breathed.  He denied  cough and shortness of breath. Pain radiated to his right shoulder. There  was no diaphoresis, palpitations, nausea or vomiting, or fever or chills.  The pain persisted so he presented to the [facility] Emergency  Department. Initial evaluation on chest x-ray revealed cardiomegaly. He had  a CT scan of the chest to rule out pulmonary embolism and the CT scan was  negative for pulmonary embolism but confirmed marked cardiomegaly and showed  bibasilar air space disease. Mr. Sweetman was initially admitted under the  medical service for further observation and workup including 2-D  echocardiogram and serial cardiac enzymes.   HOSPITAL COURSE:  [name] is a 50 year old African-American male with  past medical history of  obesity and hyperlipidemia. At approximately 11:00  p.m. on June 29 he began having pleuritic chest pain when he breathed.  He  denied cough and shortness of breath. Pain radiated to his right shoulder.  There was no diaphoresis, palpitations, nausea or vomiting, or fever or  chills.  The pain persisted so he presented to the [facility]  Emergency Department. Initial evaluation on chest x-ray revealed  cardiomegaly. He had a CT scan of the chest to rule out pulmonary embolism  and the CT scan was negative for pulmonary embolism but confirmed marked  cardiomegaly and showed bibasilar air space disease. [name] was  initially admitted under the medical service for further observation and  workup including 2-D echocardiogram and serial cardiac enzymes.   [name] was admitted to [facility] in the early morning of December 06, 2004 after presenting with pleuritic chest pain since the  previous  evening. Again he was initially admitted under the medical service. As part  of his workup he underwent EKG and cardiac enzymes which were negative.  A  CT scan of the chest had also been performed and ruled out pulmonary  embolism. A 2-D echocardiogram was performed on June 30th with a stat report  called for probable thoracic aortic aneurysm. There was marked dilatation of  the aortic root and ascending aorta as large as 7.5 cm. There was  significant aortic insufficiency and small posterior pericardial effusion.  There was moderate-to-severe aortic valvular regurgitation with left  ventricular ejection fraction estimated at 65%.   At this point, Dr. Valera Castle from [facility] was consulted who  then, in turn, consulted Dr. Evelene Croon regarding emergent repair of his  ascending aortic aneurysm. Again, the previous CT scan of the chest did not  read as showing an aortic aneurysm; but upon review by Dr. Laneta Simmers, he felt  there was a possible dissection plane in the ascending aorta.  Of note  aortic dissection on transthoracic echo and CT scan could not definitively  be determined, however, there was a large suspicion for dissection. Mr.  Benkert underwent emergent cardiac catheterization to assess his coronary  anatomy and to rule out significant coronary lesion, but due to the  patient's large size as well as the large aortic aneurysm, none of the  catheters were long enough to engage the coronary arteries. Aortic root  injection appeared to show a fairly normal left main coronary artery. The  catheterization did show what looked like localized aortic dissection of the  aortic root. Mr. Rann continued to have chest pain and was taken  emergently to the operating room.   A transesophageal echocardiogram was performed intraoperatively by  anesthesiology, which showed a 7-cm ascending aortic aneurysm which began just above the aortic annulus.  There was a  moderate-to-severe aortic  insufficiency. There was an aortic dissection that appeared to be localized  to the aortic root but no significant mitral regurgitation. There was no  sign of rupture. He did undergo resection and grafting of an acute ascending  aortic dissection as well as aortic valve replacement. Postoperatively he  was transferred to the surgical intensive care unit in critical, but  hemodynamically stable condition.   On postoperative day #1 Mr. Nakatani remained stable on low-dose dopamine  drip. Blood pressure was 115/65. He was atrial paced at 90. EKG did show a  normal sinus rhythm with diffuse ST elevation. It was unclear if this was  related to pericarditis or ischemic changes.  Cardiology suspected that he  had a perioperative inferior myocardial infarction due to the dissection of  his right coronary artery. They felt that this could be followed up on an  outpatient basis with consideration of future Cardiolite.  He was started on  a low-dose beta blocker as well as aspirin and Coumadin therapy.  He was  extubated later on postoperative day #1.  Postoperative labs were stable  other than an elevated blood sugar at 142 which was initially managed with  Lantus and Glucomander in the immediate postoperative period. Hemoglobin A1c  was mildly elevated at 6.1.   On postoperative day #2 Mr. Vanriper remained stable. He was no longer atrial  paced on dopamine drip. He was in sinus rhythm. Postoperative weight was up  approximately 5 pounds and was treated with the short-term diuretic therapy.  Again, labs were stable at this time with elevated white blood count of  21,800. He was treated prophylactically with 24 hours of vancomycin  postoperatively.  Otherwise there is no notable source of infection. His  central line was removed and followup labs were ordered. Subsequently his  leukocytosis improved without further treatment.   By postoperative day #4 Mr. Mckellips was felt  stable to transfer out to the  floor. He remained on Unit 2000 until discharge home. Of note once in 2000,  he did have nonsustained burst of supraventricular tachycardia. At this time  it has been treated successfully with increase in his beta blocker. He is  also started on a ACE inhibitor, however, due to systolic blood pressure  less than 110 this was held and felt to be restarted on an outpatient basis.  Once in 2000 he was started on a carbohydrate modified diet. He was now off  the insulin Glucomander, but was still receiving subcutaneous Lantus.  His  blood sugars were improving, and it was felt that the Lantus could be  discontinued with close followup as an outpatient by his primary care  physician.   By postoperative day #6 Mr. Brassell was felt nearly ready for discharge  home. Again, he remained stable with last vital signs showing blood pressure  105/63, heart rate in 70s, and in sinus rhythm, temperature 97.4 and oxygen saturation 90-92% on room air. His weight is now at his baseline. His INR  was rising and now at 1.8. He was tolerating an oral diet. His bowel and  bladder were functioning appropriately.   His exam showed his heart with a regular rate and rhythm with a quick aortic  valve click.  His lungs showed faint crackles of the left base, but were  otherwise clear. His abdominal exam was benign. His extremities showed no  significant edema and incisions were healing well without signs of  infection.   He was ambulating in increased distances in the hallway with cardiac rehab  and was felt to have a steady gait. Of note, he was prescribed CPAP and was  given CPAP at night during his hospitalization. His external pacing wires  were removed as he was maintaining a normal sinus rhythm. It was felt that  if Mr. Mazon continues to progress in this manner he will be ready for  discharge home on postoperative day #7, December 13, 2004.   Recent labs show a white blood count  of 11.8, hemoglobin 10.6, hematocrit  31.4, platelet count 195. Sodium of 140, potassium 3.5 which was  supplemented, chloride 104, CO2 30, blood glucose 80, BUN 13, creatinine  0.9, calcium 8.1.  Hemoglobin A1c is 6.1. His total bilirubin of 0.9,  alkaline phosphatase 69, SGOT of 10, SGPT 30, total protein 6.3, blood  albumin 3.5, total cholesterol 170, triglycerides 21 and HDL 47, LDL 119,  total cholesterol to HDL ratio 3.6, TSH of 0.800, BMP 61, lipase 18.   DISCHARGE MEDICATIONS:  1.  Aspirin 81 mg 1 p.o. daily  2.  Toprol XL 50 mg 1 p.o. daily  3.  Lipitor 20 mg 1 p.o. daily.  4.  Coumadin, this dose to be determined at time of discharge but anticipate      sending him home on 7.5 mg p.o. daily until instructed further by Dr.      Daleen Squibb  5.  Ultram 50 mg 1-2 tablets p.o. q.4-6h. p.r.n. pain.   DISCHARGE INSTRUCTIONS:  He was instructed to avoid driving or heavy lifting  more than 10 pounds. He is to continue daily walking and breathing  exercises. He is to follow a low fat, low salt carbohydrate modified diet.  He may shower and clean his incisions gently with mild soap and water. He  should notify the CVTS office if he develops fever greater than 101 or  redness or drainage from his incision site.   FOLLOWUP:  1.  He is to obtain PT/INR blood work at the [facility]. It is      anticipated that his first appointment will be on Monday, December 16, 2004,      however, this be clarified before of Mr. Lupinacci is discharged.  He is      to call [phone] to schedule a 2-week followup with Dr. Daleen Squibb with a      chest x-ray; and was instructed to bring his chest x-ray film with him      to the appointment to see Dr. Laneta Simmers.  2.  He will Dr. Evelene Croon at the CVTS office on December 31, 2004 at 12:00      p.m.  3.  He is to call and schedule followup with his primary physician Dr. Dorothyann Peng within the next one to 1-2 weeks to reevaluate his blood sugars. 4.  He will  have further follow-up for his possible perioperative inferior      myocardial infarction at [facility] with possible future      Cardiolite. This will be discussed with him further at his follow up      with Dr. Daleen Squibb.       AWZ/MEDQ  D:  12/12/2004  T:  12/12/2004  Job:  [id number]   cc:   Evelene Croon, M.D.  [address]  [place]  [place] [address]  Fax: [phone]   Jesse Sans. Wall, M.D.   Candyce Churn. Allyne Gee, M.D.  [address]  [address]  [place]  [place] [address]  Fax: [phone]

## 2010-10-25 NOTE — Op Note (Signed)
NAME:  [name], LATNER NO.:  [id number]   MEDICAL RECORD NO.:  [id number]          PATIENT TYPE:  INP   LOCATION:  2303                         FACILITY:  [facility]   PHYSICIAN:  Zenon Mayo, MDDATE OF BIRTH:  22-Mar-1961   DATE OF PROCEDURE:  12/06/2004  DATE OF DISCHARGE:                                 OPERATIVE REPORT   PROCEDURE PERFORMED:  Intraoperative transesophageal echocardiogram.   ANESTHESIOLOGIST:  Zenon Mayo, M.D.   INDICATIONS:  Evaluation of ascending aortic aneurysm.   DESCRIPTION OF PROCEDURE:  Mr. Fleischer is a 50 year old male with no  significant past medical history who presented today with the acute onset of  chest pain.  While being evaluated by cardiology he was found to have an  aortic arch aneurysm.  He was brought urgently to the operating room by Dr.  Laneta Simmers for repair of is aortic aneurysm because it was seen that there was a  possible dissection involved.   The patient was brought to the operating room and placed under general  anesthesia.  After confirmation of endotracheal tube placement a  transesophageal echo probe was placed into his esophagus without  complications.   The left ventricle was imaged initially.  There appeared to be no wall  motion abnormalities.  The left ventricle revealed moderate hypertrophy with  an estimated ejection fraction of 55%.   The aorta was then evaluated.  The aortic valve was tricuspid in nature.  The leaflets appeared to move well and there was no vegetation or  calcification seen.  However, the annulus was dilated thus the aortic valve  leaflets did not coapt.  This resulted in severe aortic insufficiency.  The  annulus was measured to be 28 mm.  The sinus of Valsalva was 52 mm and the  sinotubular ridge was 55 cm.  Just distal to the aortic valve in the  ascending portion of the aorta a 7.2 cm aneurysm was seen.  There was  dissection present as revealed by a flap within  the aneurysmal wall.  The  thoracic aorta was imaged to try to establish the extent of the aneurysm.  The thoracic aorta, the descending portion, measured 30 mm in diameter.  It  revealed minimal atherosclerotic disease; hence, upon evaluation of the  descending aorta and continued on into the arch of the aorta there did not  seem to be any extent of the aneurysm.   Next, the mitral valve was imaged.  The mitral annulus was normal in  appearance.  There were no calcifications or vegetations seen.  The mitral  valve leaflets appeared to coapt well.  There was a trace amount of mitral  regurgitation seen.  Looking further into the left atrium the left atrial  appendage was free from thrombus.  The tricuspid and pulmonic valves showed  trace amounts of regurgitation, but the leaflets moved well.  The intra-  atrial septum was intact.   At the conclusion of deep hypothermic circulatory arrest as well as  cardiopulmonary bypass the heart was once again evaluated.  The aortic valve  as well as root had been replaced, and  the aortic valve appeared to be in  good position.  There appeared to be a trace amount of aortic regurgitation.  The left ventricle continued to have good function.  There were no new wall  motion abnormalities noted.  All other structures of the heart remained  unchanged from pre-bypass evaluation.  The patient was placed on dopamine  for inotropic support and at the conclusion of the procedure the echo probe  was removed from the esophagus without complications.   The patient was taken directly from the operating room to the intensive care  unit in a stable condition.       WEF/MEDQ  D:  12/06/2004  T:  12/07/2004  Job:  [id number]   cc:   Department of Anesthesiology

## 2010-10-25 NOTE — Assessment & Plan Note (Signed)
[facility]                                 ON-CALL NOTE   NAME:[name], [name]                  MRN:          [id number]  DATE:06/19/2006                            DOB:          08/11/1960    PROGRESS NOTE:  [name] is a 50 year old male with an aortic valve  replacement in 2006. He tells me that his coronary arteries were totally  normal at the time. He said that yesterday, he bent over to pick  something up and since that time, he has had chest pain, which is worse  anytime that he moves or takes a deep breath. I told him that it is  likely that this chest pain is related to his heart and much more likely  related to his chest wall, as if he may have strained something. He then  told me that he had numbness in the back of both hands. I explained to  him that this was unlikely to be a focal neurologic situation or related  to his heart. He then told me that when he was diagnosed with his valve  problem in 2006, he was told by the doctor that it was not his heart and  there was nothing to work about, so he continues to be concerned about  his heart. I told him that if he was worried about it, he should present  to the ER for further evaluation and I would be happy to have the ER  doctor to evaluate him and if need be, we would be happy to evaluate him  as well.     Bevelyn Buckles. Bensimhon, MD  Electronically Signed    DRB/MedQ  DD: 06/20/2006  DT: 06/20/2006  Job #: [id number]

## 2010-10-25 NOTE — Letter (Signed)
June 20, 2006    Candyce Churn. Allyne Gee, M.D.  [address], [address]  [place], [place] [address]   RE:  [name], [name]  MRN:  [id number]  /  DOB:  1960/10/16   Dear Dr. Allyne Gee:   I recently had the pleasure of meeting [name] in the ER  for evaluation of chest discomfort. Admission is today. You will  have  been carbon copied a copy of the admission note and should get a  discharge summary as well. I did want to bring your attention to a CAT  scan that was performed. As is often the case, the radiologist read  paratracheal lymphadenopathy and suggested 3-4 month followup. I  discussed this with the patient so he understands this. I am sending  this letter to you and also carbon copied a copy to Dr. Daleen Squibb, so that  there can be followup of this abnormality. Thank you for your help with  this matter.    Sincerely,      Rollene Rotunda, MD, [facility]  Electronically Signed    JH/MedQ  DD: 06/20/2006  DT: 06/21/2006  Job #: [id number]   CC:    Thomas C. Wall, MD, [facility]

## 2010-10-25 NOTE — Procedures (Signed)
[place]  Patient:    NICHALAS, COIN                  MRN: [id number] Proc. Date: 10/07/00 Adm. Date:  [id number] Attending:  Charna Elizabeth CC:         Merlene Laughter. Renae Gloss, M.D.   Procedure Report  DATE OF BIRTH:  Sep 14, 1960  REFERRING PHYSICIAN:  Merlene Laughter. Renae Gloss, M.D.  PROCEDURE PERFORMED:  Esophagogastroduodenoscopy.  ENDOSCOPIST:  Anselmo Rod, M.D.  INSTRUMENT USED:  Olympus video panendoscope.  INDICATIONS FOR PROCEDURE:  Epigastric pain and black stool in a 50 year old African-American male rule out peptic ulcer disease, esophagitis, gastritis, etc.  PREPROCEDURE PREPARATION:  Informed consent was procured from the patient. The patient was fasted for eight hours prior to the procedure.  PREPROCEDURE PHYSICAL:  The patient had stable vital signs.  Neck supple. Chest clear to auscultation.  S1, S2 regular.  Abdomen soft with normal abdominal bowel sounds.  DESCRIPTION OF PROCEDURE:  The patient was placed in left lateral decubitus position and sedated with 75 mg of Demerol and 7.5 mg of Versed intravenously. Once the patient was adequately sedated and maintained on low-flow oxygen and continuous cardiac monitoring, the Olympus video panendoscope was advanced through the mouthpiece, over the tongue, into the esophagus under direct vision.  The entire esophagus and gastric mucosa in the proximal small bowel appeared normal.  IMPRESSION:  Normal esophagogastroduodenoscopy.  RECOMMENDATION:  Repeat guaiac testing will be done and further recommendations made.  If his stool continues to show evidence of occult blood, a colonoscopy may be required. DD:  10/07/00 TD:  10/07/00 Job: [id number]

## 2010-10-31 ENCOUNTER — Encounter: Payer: Self-pay | Admitting: *Deleted

## 2010-11-06 ENCOUNTER — Encounter: Payer: BC Managed Care – PPO | Admitting: Cardiology

## 2010-11-06 ENCOUNTER — Encounter: Payer: BC Managed Care – PPO | Admitting: *Deleted

## 2010-11-07 ENCOUNTER — Emergency Department (HOSPITAL_COMMUNITY): Payer: No Typology Code available for payment source

## 2010-11-07 ENCOUNTER — Emergency Department (HOSPITAL_COMMUNITY)
Admission: EM | Admit: 2010-11-07 | Discharge: 2010-11-07 | Disposition: A | Discharge status: Home / Self Care | Payer: No Typology Code available for payment source | Attending: Emergency Medicine | Admitting: Emergency Medicine

## 2010-11-07 DIAGNOSIS — Y92410 Unspecified street and highway as the place of occurrence of the external cause: Secondary | ICD-10-CM | POA: Insufficient documentation

## 2010-11-07 DIAGNOSIS — R10819 Abdominal tenderness, unspecified site: Secondary | ICD-10-CM | POA: Insufficient documentation

## 2010-11-07 DIAGNOSIS — I10 Essential (primary) hypertension: Secondary | ICD-10-CM | POA: Insufficient documentation

## 2010-11-07 DIAGNOSIS — T1490XA Injury, unspecified, initial encounter: Secondary | ICD-10-CM | POA: Insufficient documentation

## 2010-11-07 DIAGNOSIS — E7800 Pure hypercholesterolemia, unspecified: Secondary | ICD-10-CM | POA: Insufficient documentation

## 2010-11-07 DIAGNOSIS — M549 Dorsalgia, unspecified: Secondary | ICD-10-CM | POA: Insufficient documentation

## 2010-11-07 DIAGNOSIS — Z7901 Long term (current) use of anticoagulants: Secondary | ICD-10-CM | POA: Insufficient documentation

## 2010-11-07 DIAGNOSIS — R079 Chest pain, unspecified: Secondary | ICD-10-CM | POA: Insufficient documentation

## 2010-11-21 ENCOUNTER — Ambulatory Visit (INDEPENDENT_AMBULATORY_CARE_PROVIDER_SITE_OTHER): Payer: BC Managed Care – PPO | Admitting: *Deleted

## 2010-11-21 DIAGNOSIS — I359 Nonrheumatic aortic valve disorder, unspecified: Secondary | ICD-10-CM

## 2010-11-21 DIAGNOSIS — Z952 Presence of prosthetic heart valve: Secondary | ICD-10-CM

## 2010-11-21 DIAGNOSIS — Z7901 Long term (current) use of anticoagulants: Secondary | ICD-10-CM

## 2010-11-21 LAB — POCT INR: INR: 2.1

## 2010-12-19 ENCOUNTER — Ambulatory Visit (INDEPENDENT_AMBULATORY_CARE_PROVIDER_SITE_OTHER): Payer: BC Managed Care – PPO | Admitting: *Deleted

## 2010-12-19 ENCOUNTER — Encounter: Payer: BC Managed Care – PPO | Admitting: *Deleted

## 2010-12-19 DIAGNOSIS — Z952 Presence of prosthetic heart valve: Secondary | ICD-10-CM

## 2010-12-19 DIAGNOSIS — I359 Nonrheumatic aortic valve disorder, unspecified: Secondary | ICD-10-CM

## 2010-12-19 DIAGNOSIS — Z7901 Long term (current) use of anticoagulants: Secondary | ICD-10-CM

## 2010-12-23 ENCOUNTER — Other Ambulatory Visit: Payer: Self-pay | Admitting: Internal Medicine

## 2010-12-23 DIAGNOSIS — R609 Edema, unspecified: Secondary | ICD-10-CM

## 2010-12-23 DIAGNOSIS — R52 Pain, unspecified: Secondary | ICD-10-CM

## 2010-12-24 ENCOUNTER — Ambulatory Visit
Admission: RE | Admit: 2010-12-24 | Discharge: 2010-12-24 | Disposition: A | Discharge status: Home / Self Care | Payer: BC Managed Care – PPO | Source: Ambulatory Visit | Attending: Internal Medicine | Admitting: Internal Medicine

## 2010-12-24 DIAGNOSIS — R609 Edema, unspecified: Secondary | ICD-10-CM

## 2010-12-24 DIAGNOSIS — R52 Pain, unspecified: Secondary | ICD-10-CM

## 2011-01-15 ENCOUNTER — Other Ambulatory Visit: Payer: Self-pay | Admitting: *Deleted

## 2011-01-15 MED ORDER — WARFARIN SODIUM 7.5 MG PO TABS: ORAL_TABLET | ORAL | Status: DC

## 2011-01-23 ENCOUNTER — Ambulatory Visit (INDEPENDENT_AMBULATORY_CARE_PROVIDER_SITE_OTHER): Payer: BC Managed Care – PPO | Admitting: Cardiology

## 2011-01-23 ENCOUNTER — Ambulatory Visit (INDEPENDENT_AMBULATORY_CARE_PROVIDER_SITE_OTHER): Payer: BC Managed Care – PPO | Admitting: *Deleted

## 2011-01-23 ENCOUNTER — Encounter: Payer: Self-pay | Admitting: Cardiology

## 2011-01-23 VITALS — BP 122/64 | HR 66 | Ht 72.0 in | Wt 320.0 lb

## 2011-01-23 DIAGNOSIS — Z7901 Long term (current) use of anticoagulants: Secondary | ICD-10-CM

## 2011-01-23 DIAGNOSIS — I10 Essential (primary) hypertension: Secondary | ICD-10-CM

## 2011-01-23 DIAGNOSIS — Z952 Presence of prosthetic heart valve: Secondary | ICD-10-CM

## 2011-01-23 DIAGNOSIS — E785 Hyperlipidemia, unspecified: Secondary | ICD-10-CM

## 2011-01-23 DIAGNOSIS — E669 Obesity, unspecified: Secondary | ICD-10-CM

## 2011-01-23 DIAGNOSIS — I359 Nonrheumatic aortic valve disorder, unspecified: Secondary | ICD-10-CM

## 2011-01-23 MED ORDER — AMPICILLIN 500 MG PO CAPS: ORAL_CAPSULE | ORAL | Status: DC

## 2011-01-23 NOTE — Assessment & Plan Note (Signed)
Improved

## 2011-01-23 NOTE — Assessment & Plan Note (Signed)
Stable by history and exam. No obvious aortic insufficiency. Continue medical therapy. We have written a note for him to stop his Coumadin for 3 days prior to dental cleaning. He will also need antibiotic coverage per the American Heart Association guidelines. Followup with me in one year. The

## 2011-01-23 NOTE — Progress Notes (Signed)
HPI [name] comes in for evaluation and management of his history of aortic valve replacement and aortic root replacement from a dissecting aortic aneurysm.  He's doing well with no chest pain, palpitations, or significant shortness of breath. He is exercising in the gym 5 days a week. He needs his teeth cleaned.  His heart today is 1.8. When questioned, he is not real clear about foods he should eat. We will give him materials today.  His EKG shows normal sinus rhythm with a left axis and some ST segment changes laterally. Past Medical History  Diagnosis Date  . S/P aortic valve replacement     HX of St. Jude  . Chest pain, atypical   . Hypertension     Unspecified  . Hyperlipidemia     Mixed  . Obesity   . Sleep apnea, obstructive     Past Surgical History  Procedure Date  . Emergency median sternotomy 12/06/2004  . Extracorporeal circulation 12/06/2004  . Replacement of aortic valve 12/06/2004  . Ascending aortic dissection aneurysm 12/06/2004    Using a 27-mm St. Jude mechanical valve conduit with reimplantation of both coronary arteries  . Intraoperative transesophageal echocardiogram 12/06/2004    Family History  Problem Relation Age of Onset  . Cervical cancer Mother   . Coronary artery disease Mother   . Diabetes Mother   . Diabetes Father   . Cervical cancer Sister     History   Social History  . Marital Status: Married    Spouse Name: N/A    Number of Children: N/A  . Years of Education: N/A   Occupational History  . Not on file.   Social History Main Topics  . Smoking status: Former Smoker    Quit date: 06/09/1989  . Smokeless tobacco: Former Neurosurgeon   Comment: Quit 1991  . Alcohol Use: No     Quit 1991  . Drug Use: No  . Sexually Active: Not on file   Other Topics Concern  . Not on file   Social History Narrative   MarriedNo regular exercise    No Known Allergies  Current Outpatient Prescriptions  Medication Sig Dispense Refill  .  aspirin 81 MG EC tablet Take 81 mg by mouth daily.        . metoprolol (TOPROL-XL) 50 MG 24 hr tablet Take 50 mg by mouth daily.        . Multiple Vitamins-Minerals (CENTRUM SILVER PO) Take 1 tablet by mouth daily.        . pregabalin (LYRICA) 75 MG capsule Take 75 mg by mouth 2 (two) times daily.        . rosuvastatin (CRESTOR) 10 MG tablet Take 10 mg by mouth. 3 days a week on MWF       . warfarin (COUMADIN) 7.5 MG tablet Take as directed by Anticoagulation clinic   35 tablet  3    ROS Negative other than HPI.   PE General Appearance: well developed, well nourished in no acute distress, large, overweight HEENT: symmetrical face, PERRLA, poor dentition Neck: no JVD, thyromegaly, or adenopathy, trachea midline Chest: symmetric without deformity Cardiac: PMI non-displaced, RRR, normal S1, S2, no gallop, soft systolic murmur, no AI Lung: clear to ausculation and percussion Vascular: all pulses full without bruits  Abdominal: nondistended, nontender, good bowel sounds, no HSM, no bruits Extremities: no cyanosis, clubbing, 1+ pitting edema with chronic venous changes,no sign of DVT, no varicosities  Skin: normal color, no rashes Neuro: alert and oriented  x 3, non-focal Pysch: normal affect Filed Vitals:   01/23/11 1604  BP: 122/64  Pulse: 66  Height: 6' (1.829 m)  Weight: 320 lb (145.151 kg)    EKG  Labs and Studies Reviewed.   Lab Results  Component Value Date   WBC 8.1 10/16/2010   HGB 12.2* 10/16/2010   HCT 38.6* 10/16/2010   MCV 89.1 10/16/2010   PLT 177 10/16/2010      Chemistry      Component Value Date/Time   NA 140 10/16/2010 1658   K 3.6 10/16/2010 1658   CL 106 10/16/2010 1658   CO2 28 10/16/2010 1658   BUN 14 10/16/2010 1658   CREATININE 0.69 10/16/2010 1658      Component Value Date/Time   CALCIUM 9.2 10/16/2010 1658       No results found for this basename: CHOL   No results found for this basename: HDL   No results found for this basename: LDLCALC   No results  found for this basename: TRIG   No results found for this basename: CHOLHDL   No results found for this basename: HGBA1C   No results found for this basename: ALT, AST, GGT, ALKPHOS, BILITOT   No results found for this basename: TSH

## 2011-01-23 NOTE — Patient Instructions (Signed)
Your physician recommends that you schedule a follow-up appointment in: 1 year with Dr. Dorinda Hill may stop your Coumadin 3 days prior to you dental cleaning. You will need to take Ampicillin 2 grams 1 hour prior to your dental appointment.

## 2011-02-20 ENCOUNTER — Encounter: Payer: BC Managed Care – PPO | Admitting: *Deleted

## 2011-02-27 ENCOUNTER — Ambulatory Visit (INDEPENDENT_AMBULATORY_CARE_PROVIDER_SITE_OTHER): Payer: BC Managed Care – PPO | Admitting: *Deleted

## 2011-02-27 DIAGNOSIS — I359 Nonrheumatic aortic valve disorder, unspecified: Secondary | ICD-10-CM

## 2011-02-27 DIAGNOSIS — Z7901 Long term (current) use of anticoagulants: Secondary | ICD-10-CM

## 2011-02-27 DIAGNOSIS — Z952 Presence of prosthetic heart valve: Secondary | ICD-10-CM

## 2011-03-03 LAB — I-STAT 8, (EC8 V) (CONVERTED LAB)
Acid-Base Excess: 2
Bicarbonate: 28.8 — ABNORMAL HIGH
Glucose, Bld: 84
HCT: 41
Hemoglobin: 13.9
Operator id: 284141
Potassium: 3.7
Sodium: 145
TCO2: 30

## 2011-03-03 LAB — URINALYSIS, ROUTINE W REFLEX MICROSCOPIC
Bilirubin Urine: NEGATIVE
Hgb urine dipstick: NEGATIVE
Ketones, ur: NEGATIVE
Specific Gravity, Urine: 1.031 — ABNORMAL HIGH
Urobilinogen, UA: 0.2

## 2011-03-03 LAB — POCT CARDIAC MARKERS
Myoglobin, poc: 51.4
Operator id: 151321
Troponin i, poc: <0.05

## 2011-03-03 LAB — POCT I-STAT CREATININE
Creatinine, Ser: 1.2
Operator id: 284141

## 2011-03-06 LAB — POCT I-STAT, CHEM 8
BUN: 15
Calcium, Ion: 1.22
Chloride: 108
Creatinine, Ser: 1.1

## 2011-03-06 LAB — POCT CARDIAC MARKERS
Myoglobin, poc: 181
Operator id: 272551
Troponin i, poc: <0.05

## 2011-03-06 LAB — PROTIME-INR: INR: 2.6 — ABNORMAL HIGH

## 2011-03-11 ENCOUNTER — Institutional Professional Consult (permissible substitution): Payer: BC Managed Care – PPO | Admitting: Pulmonary Disease

## 2011-03-21 ENCOUNTER — Encounter: Payer: Self-pay | Admitting: Pulmonary Disease

## 2011-03-21 ENCOUNTER — Ambulatory Visit (INDEPENDENT_AMBULATORY_CARE_PROVIDER_SITE_OTHER): Payer: BC Managed Care – PPO | Admitting: Pulmonary Disease

## 2011-03-21 VITALS — BP 140/78 | HR 60 | Temp 98.1°F | Ht 74.0 in | Wt 330.0 lb

## 2011-03-21 DIAGNOSIS — R0989 Other specified symptoms and signs involving the circulatory and respiratory systems: Secondary | ICD-10-CM

## 2011-03-21 DIAGNOSIS — J984 Other disorders of lung: Secondary | ICD-10-CM | POA: Insufficient documentation

## 2011-03-21 DIAGNOSIS — R0600 Dyspnea, unspecified: Secondary | ICD-10-CM

## 2011-03-21 NOTE — Assessment & Plan Note (Signed)
The patient works in [employer] where he is exposed to very strong odors while at work.  He needs to be cleared from a pulmonary standpoint for a respirator, and therefore needs full pulmonary function studies.  He does not have any issues with cough or significant dyspnea on exertion while at home, but does have issues while at work.  It is unclear whether he may have reactive airways disease associated with his occupational exposure.

## 2011-03-21 NOTE — Progress Notes (Signed)
  Subjective:    Patient ID: [name], male    DOB: July 22, 1960, 50 y.o.   MRN: [id number]  HPI The patient is a 50 year old male who I have been asked to see for possible occupational lung disease.  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  He does not wear a mask, and there is poor ventilation according to the patient.  His employers are needing clearance so that he can be fitted for a respirator, and most recently attempts at spirometry were unsuccessful due to to the patient's inability to perform the test.  The patient denies chronic breathing issues, and tries to stay active by going to the gym.  He admits to some degree of dyspnea on exertion due to his obesity, but does not feel that it is significant.  He denies any cough or congestion while at home.  He does admit the strong odors at his job do bother him at times.  He has no history of childhood asthma.   Review of Systems  Constitutional: Negative for fever and unexpected weight change.  HENT: Negative for ear pain, nosebleeds, congestion, sore throat, rhinorrhea, sneezing, trouble swallowing, dental problem, postnasal drip and sinus pressure.   Eyes: Negative for redness and itching.  Respiratory: Negative for cough, chest tightness, shortness of breath and wheezing.   Cardiovascular: Positive for leg swelling. Negative for palpitations.  Gastrointestinal: Negative for nausea and vomiting.  Genitourinary: Negative for dysuria.  Musculoskeletal: Positive for joint swelling.  Skin: Negative for rash.  Neurological: Positive for headaches.  Hematological: Does not bruise/bleed easily.  Psychiatric/Behavioral: Negative for dysphoric mood. The patient is not nervous/anxious.        Objective:   Physical Exam Constitutional:  Obese male, no acute distress  HENT:  Nares patent without discharge  Oropharynx without exudate, palate and uvula are elongated  and thickened.  Eyes:  Perrla, eomi, no scleral icterus  Neck:  No JVD, no TMG  Cardiovascular:  Normal rate, regular rhythm, no rubs or gallops. 3/6 murmur with click        Intact distal pulses  Pulmonary :  Normal breath sounds, no stridor or respiratory distress   No rales, rhonchi, or wheezing  Abdominal:  Soft, nondistended, bowel sounds present.  No tenderness noted.   Musculoskeletal:  1+ lower extremity edema noted.  Lymph Nodes:  No cervical lymphadenopathy noted  Skin:  No cyanosis noted  Neurologic:  Alert, appropriate, moves all 4 extremities without obvious deficit.         Assessment & Plan:

## 2011-03-21 NOTE — Patient Instructions (Signed)
Will schedule for breathing studies, and see you back the same day for review.  

## 2011-03-27 ENCOUNTER — Ambulatory Visit (INDEPENDENT_AMBULATORY_CARE_PROVIDER_SITE_OTHER): Payer: BC Managed Care – PPO | Admitting: *Deleted

## 2011-03-27 DIAGNOSIS — Z7901 Long term (current) use of anticoagulants: Secondary | ICD-10-CM

## 2011-03-27 DIAGNOSIS — I359 Nonrheumatic aortic valve disorder, unspecified: Secondary | ICD-10-CM

## 2011-03-27 DIAGNOSIS — Z952 Presence of prosthetic heart valve: Secondary | ICD-10-CM

## 2011-03-27 LAB — POCT INR: INR: 2.3

## 2011-04-08 ENCOUNTER — Ambulatory Visit (HOSPITAL_COMMUNITY)
Admission: RE | Admit: 2011-04-08 | Discharge: 2011-04-08 | Disposition: A | Discharge status: Home / Self Care | Payer: BC Managed Care – PPO | Source: Ambulatory Visit | Attending: Pulmonary Disease | Admitting: Pulmonary Disease

## 2011-04-08 ENCOUNTER — Ambulatory Visit: Payer: BC Managed Care – PPO | Admitting: Pulmonary Disease

## 2011-04-08 ENCOUNTER — Encounter: Payer: Self-pay | Admitting: Pulmonary Disease

## 2011-04-08 ENCOUNTER — Ambulatory Visit (INDEPENDENT_AMBULATORY_CARE_PROVIDER_SITE_OTHER): Payer: BC Managed Care – PPO | Admitting: Pulmonary Disease

## 2011-04-08 VITALS — BP 140/72 | HR 60 | Temp 98.5°F | Ht 74.0 in | Wt 329.6 lb

## 2011-04-08 DIAGNOSIS — R0600 Dyspnea, unspecified: Secondary | ICD-10-CM

## 2011-04-08 DIAGNOSIS — R0609 Other forms of dyspnea: Secondary | ICD-10-CM | POA: Insufficient documentation

## 2011-04-08 DIAGNOSIS — R0989 Other specified symptoms and signs involving the circulatory and respiratory systems: Secondary | ICD-10-CM | POA: Insufficient documentation

## 2011-04-08 DIAGNOSIS — J984 Other disorders of lung: Secondary | ICD-10-CM

## 2011-04-08 LAB — PULMONARY FUNCTION TEST

## 2011-04-08 NOTE — Assessment & Plan Note (Addendum)
The pt has moderate restriction that is most likely related to his morbid obesity.  His cxr from 10/2010 shows no ISLD, and there is no obstruction with a normal DLCO.  He is cleared for fitting and use of a respiratory while at work.  I have asked him to work aggressively on weight loss.  Will see him back as needed.

## 2011-04-08 NOTE — Patient Instructions (Signed)
You are cleared to wear a respiratory while at work. Work on weight loss

## 2011-04-08 NOTE — Progress Notes (Signed)
  Subjective:    Patient ID: [name], male    DOB: Nov 02, 1960, 50 y.o.   MRN: [id number]  HPI Patient comes in today for followup after his recent pulmonary function studies.  He was found to have no air flow obstruction, moderate restriction, and a normal diffusion capacity.  I have reviewed the studies in detail with the patient, and answered all of his questions.   Review of Systems  Constitutional: Negative for fever and unexpected weight change.  HENT: Negative for ear pain, nosebleeds, congestion, sore throat, rhinorrhea, sneezing, trouble swallowing, dental problem, postnasal drip and sinus pressure.   Eyes: Negative for redness and itching.  Respiratory: Positive for chest tightness and shortness of breath. Negative for cough and wheezing.   Cardiovascular: Positive for palpitations and leg swelling.  Gastrointestinal: Negative for nausea and vomiting.  Genitourinary: Negative for dysuria.  Musculoskeletal: Negative for joint swelling.  Skin: Negative for rash.  Neurological: Positive for headaches.  Hematological: Bruises/bleeds easily.  Psychiatric/Behavioral: Negative for dysphoric mood. The patient is not nervous/anxious.        Objective:   Physical Exam Obese male in nad Nose without discharge or purulence Mild LE edema, no cyanosis Alert, oriented, moves all 4        Assessment & Plan:

## 2011-04-16 ENCOUNTER — Telehealth: Payer: Self-pay | Admitting: Pulmonary Disease

## 2011-04-16 NOTE — Telephone Encounter (Signed)
Error.  Duplicate message.  [name] °- °

## 2011-04-16 NOTE — Telephone Encounter (Signed)
I would recommend that he come by and get actual copies of his PFT"s and my last office note. We use state of art equipment, and can assure him is accurate.

## 2011-04-16 NOTE — Telephone Encounter (Signed)
Called and spoke with pt. He states that his employer stated that the letter [facility] wrote for him would not suffice b/c not enough information. He states that they may need actual copies of spirometry and ov notes but he seems unsure about this. He states that they sent him to UC to be seen by another doc there, he had spirometry done " on a 52 year old machine that did'nt work right"- based on these results he was told had asthma and needs to f/u with pulm again to be prescribed an inhaler. He states that he thinks that this is wrong b/c the machine. Wants further recs from [facility]. Please advise, thanks!

## 2011-04-16 NOTE — Telephone Encounter (Signed)
lmomtcb  

## 2011-04-16 NOTE — Telephone Encounter (Signed)
lmomltcb

## 2011-04-16 NOTE — Telephone Encounter (Signed)
Pt stated he is having problems w/ his work regarding his asthma.  They did not accept [facility]'s letter & made him go to Urgent Care & the doc there stated he does of asthma.  Pt would like to speak w/ [facility] or his nurse. Antionette Fairy

## 2011-04-17 NOTE — Telephone Encounter (Signed)
lmomtcb x1 

## 2011-04-18 NOTE — Telephone Encounter (Signed)
lmomtcb x 2  

## 2011-04-21 NOTE — Telephone Encounter (Signed)
LMTCB
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
## 2011-04-24 ENCOUNTER — Encounter: Payer: BC Managed Care – PPO | Admitting: *Deleted

## 2011-05-02 ENCOUNTER — Ambulatory Visit (INDEPENDENT_AMBULATORY_CARE_PROVIDER_SITE_OTHER): Payer: BC Managed Care – PPO | Admitting: *Deleted

## 2011-05-02 DIAGNOSIS — Z7901 Long term (current) use of anticoagulants: Secondary | ICD-10-CM

## 2011-05-02 DIAGNOSIS — I359 Nonrheumatic aortic valve disorder, unspecified: Secondary | ICD-10-CM

## 2011-05-02 DIAGNOSIS — Z952 Presence of prosthetic heart valve: Secondary | ICD-10-CM

## 2011-05-30 ENCOUNTER — Ambulatory Visit (INDEPENDENT_AMBULATORY_CARE_PROVIDER_SITE_OTHER): Payer: BC Managed Care – PPO | Admitting: *Deleted

## 2011-05-30 DIAGNOSIS — I359 Nonrheumatic aortic valve disorder, unspecified: Secondary | ICD-10-CM

## 2011-05-30 DIAGNOSIS — Z952 Presence of prosthetic heart valve: Secondary | ICD-10-CM

## 2011-05-30 DIAGNOSIS — Z7901 Long term (current) use of anticoagulants: Secondary | ICD-10-CM

## 2011-05-30 LAB — POCT INR: INR: 2

## 2011-06-07 ENCOUNTER — Other Ambulatory Visit: Payer: Self-pay | Admitting: Cardiology

## 2011-06-27 ENCOUNTER — Encounter: Payer: BC Managed Care – PPO | Admitting: *Deleted

## 2011-07-01 ENCOUNTER — Ambulatory Visit (INDEPENDENT_AMBULATORY_CARE_PROVIDER_SITE_OTHER): Payer: BC Managed Care – PPO | Admitting: *Deleted

## 2011-07-01 DIAGNOSIS — Z952 Presence of prosthetic heart valve: Secondary | ICD-10-CM

## 2011-07-01 DIAGNOSIS — I359 Nonrheumatic aortic valve disorder, unspecified: Secondary | ICD-10-CM

## 2011-07-01 DIAGNOSIS — Z954 Presence of other heart-valve replacement: Secondary | ICD-10-CM

## 2011-07-01 DIAGNOSIS — Z7901 Long term (current) use of anticoagulants: Secondary | ICD-10-CM

## 2011-07-01 LAB — POCT INR: INR: 2.1

## 2011-07-31 ENCOUNTER — Ambulatory Visit (INDEPENDENT_AMBULATORY_CARE_PROVIDER_SITE_OTHER): Payer: BC Managed Care – PPO | Admitting: *Deleted

## 2011-07-31 DIAGNOSIS — Z952 Presence of prosthetic heart valve: Secondary | ICD-10-CM

## 2011-07-31 DIAGNOSIS — Z954 Presence of other heart-valve replacement: Secondary | ICD-10-CM

## 2011-07-31 DIAGNOSIS — Z7901 Long term (current) use of anticoagulants: Secondary | ICD-10-CM

## 2011-07-31 DIAGNOSIS — I359 Nonrheumatic aortic valve disorder, unspecified: Secondary | ICD-10-CM

## 2011-08-28 ENCOUNTER — Ambulatory Visit (INDEPENDENT_AMBULATORY_CARE_PROVIDER_SITE_OTHER): Payer: BC Managed Care – PPO | Admitting: *Deleted

## 2011-08-28 DIAGNOSIS — I359 Nonrheumatic aortic valve disorder, unspecified: Secondary | ICD-10-CM

## 2011-08-28 DIAGNOSIS — Z954 Presence of other heart-valve replacement: Secondary | ICD-10-CM

## 2011-08-28 DIAGNOSIS — Z952 Presence of prosthetic heart valve: Secondary | ICD-10-CM

## 2011-08-28 DIAGNOSIS — Z7901 Long term (current) use of anticoagulants: Secondary | ICD-10-CM

## 2011-08-28 LAB — POCT INR: INR: 2.9

## 2011-09-08 ENCOUNTER — Ambulatory Visit
Admission: RE | Admit: 2011-09-08 | Discharge: 2011-09-08 | Disposition: A | Discharge status: Home / Self Care | Payer: No Typology Code available for payment source | Source: Ambulatory Visit | Attending: Family Medicine | Admitting: Family Medicine

## 2011-09-08 ENCOUNTER — Other Ambulatory Visit: Payer: Self-pay | Admitting: Family Medicine

## 2011-09-08 DIAGNOSIS — Z006 Encounter for examination for normal comparison and control in clinical research program: Secondary | ICD-10-CM

## 2011-10-02 ENCOUNTER — Ambulatory Visit (INDEPENDENT_AMBULATORY_CARE_PROVIDER_SITE_OTHER): Payer: BC Managed Care – PPO | Admitting: Pharmacist

## 2011-10-02 DIAGNOSIS — I359 Nonrheumatic aortic valve disorder, unspecified: Secondary | ICD-10-CM

## 2011-10-02 DIAGNOSIS — Z952 Presence of prosthetic heart valve: Secondary | ICD-10-CM

## 2011-10-02 DIAGNOSIS — Z954 Presence of other heart-valve replacement: Secondary | ICD-10-CM

## 2011-10-02 DIAGNOSIS — Z7901 Long term (current) use of anticoagulants: Secondary | ICD-10-CM

## 2011-10-02 LAB — POCT INR: INR: 3.2

## 2011-10-22 ENCOUNTER — Other Ambulatory Visit: Payer: Self-pay | Admitting: Cardiology

## 2011-10-30 ENCOUNTER — Ambulatory Visit (INDEPENDENT_AMBULATORY_CARE_PROVIDER_SITE_OTHER): Payer: BC Managed Care – PPO | Admitting: *Deleted

## 2011-10-30 DIAGNOSIS — I359 Nonrheumatic aortic valve disorder, unspecified: Secondary | ICD-10-CM

## 2011-10-30 DIAGNOSIS — Z954 Presence of other heart-valve replacement: Secondary | ICD-10-CM

## 2011-10-30 DIAGNOSIS — Z7901 Long term (current) use of anticoagulants: Secondary | ICD-10-CM

## 2011-10-30 DIAGNOSIS — Z952 Presence of prosthetic heart valve: Secondary | ICD-10-CM

## 2011-11-27 ENCOUNTER — Ambulatory Visit (INDEPENDENT_AMBULATORY_CARE_PROVIDER_SITE_OTHER): Payer: BC Managed Care – PPO | Admitting: Pharmacist

## 2011-11-27 DIAGNOSIS — I359 Nonrheumatic aortic valve disorder, unspecified: Secondary | ICD-10-CM

## 2011-11-27 DIAGNOSIS — Z952 Presence of prosthetic heart valve: Secondary | ICD-10-CM

## 2011-11-27 DIAGNOSIS — Z7901 Long term (current) use of anticoagulants: Secondary | ICD-10-CM

## 2011-11-27 DIAGNOSIS — Z954 Presence of other heart-valve replacement: Secondary | ICD-10-CM

## 2011-12-25 ENCOUNTER — Ambulatory Visit (INDEPENDENT_AMBULATORY_CARE_PROVIDER_SITE_OTHER): Payer: BC Managed Care – PPO | Admitting: *Deleted

## 2011-12-25 DIAGNOSIS — I359 Nonrheumatic aortic valve disorder, unspecified: Secondary | ICD-10-CM

## 2011-12-25 DIAGNOSIS — Z7901 Long term (current) use of anticoagulants: Secondary | ICD-10-CM

## 2011-12-25 DIAGNOSIS — Z954 Presence of other heart-valve replacement: Secondary | ICD-10-CM

## 2011-12-25 DIAGNOSIS — Z952 Presence of prosthetic heart valve: Secondary | ICD-10-CM

## 2012-01-22 ENCOUNTER — Ambulatory Visit (INDEPENDENT_AMBULATORY_CARE_PROVIDER_SITE_OTHER): Payer: BC Managed Care – PPO | Admitting: Pharmacist

## 2012-01-22 DIAGNOSIS — Z952 Presence of prosthetic heart valve: Secondary | ICD-10-CM

## 2012-01-22 DIAGNOSIS — Z954 Presence of other heart-valve replacement: Secondary | ICD-10-CM

## 2012-01-22 DIAGNOSIS — Z7901 Long term (current) use of anticoagulants: Secondary | ICD-10-CM

## 2012-01-22 DIAGNOSIS — I359 Nonrheumatic aortic valve disorder, unspecified: Secondary | ICD-10-CM

## 2012-02-19 ENCOUNTER — Ambulatory Visit (INDEPENDENT_AMBULATORY_CARE_PROVIDER_SITE_OTHER): Payer: BC Managed Care – PPO | Admitting: *Deleted

## 2012-02-19 DIAGNOSIS — Z7901 Long term (current) use of anticoagulants: Secondary | ICD-10-CM

## 2012-02-19 DIAGNOSIS — I359 Nonrheumatic aortic valve disorder, unspecified: Secondary | ICD-10-CM

## 2012-02-19 DIAGNOSIS — Z952 Presence of prosthetic heart valve: Secondary | ICD-10-CM

## 2012-02-19 DIAGNOSIS — Z954 Presence of other heart-valve replacement: Secondary | ICD-10-CM

## 2012-03-15 ENCOUNTER — Other Ambulatory Visit: Payer: Self-pay | Admitting: Cardiology

## 2012-03-18 ENCOUNTER — Ambulatory Visit (INDEPENDENT_AMBULATORY_CARE_PROVIDER_SITE_OTHER): Payer: BC Managed Care – PPO | Admitting: *Deleted

## 2012-03-18 DIAGNOSIS — Z954 Presence of other heart-valve replacement: Secondary | ICD-10-CM

## 2012-03-18 DIAGNOSIS — Z952 Presence of prosthetic heart valve: Secondary | ICD-10-CM

## 2012-03-18 DIAGNOSIS — Z7901 Long term (current) use of anticoagulants: Secondary | ICD-10-CM

## 2012-03-18 DIAGNOSIS — I359 Nonrheumatic aortic valve disorder, unspecified: Secondary | ICD-10-CM

## 2012-04-22 ENCOUNTER — Ambulatory Visit (INDEPENDENT_AMBULATORY_CARE_PROVIDER_SITE_OTHER): Payer: BC Managed Care – PPO

## 2012-04-22 DIAGNOSIS — I359 Nonrheumatic aortic valve disorder, unspecified: Secondary | ICD-10-CM

## 2012-04-22 DIAGNOSIS — Z952 Presence of prosthetic heart valve: Secondary | ICD-10-CM

## 2012-04-22 DIAGNOSIS — Z7901 Long term (current) use of anticoagulants: Secondary | ICD-10-CM

## 2012-04-22 DIAGNOSIS — Z954 Presence of other heart-valve replacement: Secondary | ICD-10-CM

## 2012-04-22 LAB — POCT INR: INR: 2.3

## 2012-05-20 ENCOUNTER — Ambulatory Visit (INDEPENDENT_AMBULATORY_CARE_PROVIDER_SITE_OTHER): Payer: BC Managed Care – PPO | Admitting: *Deleted

## 2012-05-20 DIAGNOSIS — Z954 Presence of other heart-valve replacement: Secondary | ICD-10-CM

## 2012-05-20 DIAGNOSIS — Z952 Presence of prosthetic heart valve: Secondary | ICD-10-CM

## 2012-05-20 DIAGNOSIS — Z7901 Long term (current) use of anticoagulants: Secondary | ICD-10-CM

## 2012-05-20 DIAGNOSIS — I359 Nonrheumatic aortic valve disorder, unspecified: Secondary | ICD-10-CM

## 2012-05-20 LAB — POCT INR: INR: 2.7

## 2012-05-24 ENCOUNTER — Emergency Department (HOSPITAL_COMMUNITY): Payer: BC Managed Care – PPO

## 2012-05-24 ENCOUNTER — Emergency Department (HOSPITAL_COMMUNITY)
Admission: EM | Admit: 2012-05-24 | Discharge: 2012-05-24 | Disposition: A | Discharge status: Home / Self Care | Payer: BC Managed Care – PPO | Attending: Emergency Medicine | Admitting: Emergency Medicine

## 2012-05-24 DIAGNOSIS — E669 Obesity, unspecified: Secondary | ICD-10-CM | POA: Insufficient documentation

## 2012-05-24 DIAGNOSIS — G4730 Sleep apnea, unspecified: Secondary | ICD-10-CM | POA: Insufficient documentation

## 2012-05-24 DIAGNOSIS — Z79899 Other long term (current) drug therapy: Secondary | ICD-10-CM | POA: Insufficient documentation

## 2012-05-24 DIAGNOSIS — I10 Essential (primary) hypertension: Secondary | ICD-10-CM | POA: Insufficient documentation

## 2012-05-24 DIAGNOSIS — IMO0002 Reserved for concepts with insufficient information to code with codable children: Secondary | ICD-10-CM | POA: Insufficient documentation

## 2012-05-24 DIAGNOSIS — Y92410 Unspecified street and highway as the place of occurrence of the external cause: Secondary | ICD-10-CM | POA: Insufficient documentation

## 2012-05-24 DIAGNOSIS — E785 Hyperlipidemia, unspecified: Secondary | ICD-10-CM | POA: Insufficient documentation

## 2012-05-24 DIAGNOSIS — Y9389 Activity, other specified: Secondary | ICD-10-CM | POA: Insufficient documentation

## 2012-05-24 DIAGNOSIS — Z7901 Long term (current) use of anticoagulants: Secondary | ICD-10-CM | POA: Insufficient documentation

## 2012-05-24 DIAGNOSIS — Z7982 Long term (current) use of aspirin: Secondary | ICD-10-CM | POA: Insufficient documentation

## 2012-05-24 MED ORDER — HYDROCODONE-ACETAMINOPHEN 5-325 MG PO TABS: 2.0000 | ORAL_TABLET | ORAL | Status: DC | PRN
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
## 2012-05-24 NOTE — ED Provider Notes (Signed)
Medical screening examination/treatment/procedure(s) were performed by non-physician practitioner and as supervising physician I was immediately available for consultation/collaboration.   Mithran Strike H Kathe Wirick, MD 05/24/12 1625 
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
## 2012-06-17 ENCOUNTER — Ambulatory Visit (INDEPENDENT_AMBULATORY_CARE_PROVIDER_SITE_OTHER): Payer: BC Managed Care – PPO | Admitting: *Deleted

## 2012-06-17 DIAGNOSIS — Z954 Presence of other heart-valve replacement: Secondary | ICD-10-CM

## 2012-06-17 DIAGNOSIS — I359 Nonrheumatic aortic valve disorder, unspecified: Secondary | ICD-10-CM

## 2012-06-17 DIAGNOSIS — Z7901 Long term (current) use of anticoagulants: Secondary | ICD-10-CM

## 2012-06-17 DIAGNOSIS — Z952 Presence of prosthetic heart valve: Secondary | ICD-10-CM

## 2012-07-29 ENCOUNTER — Ambulatory Visit (INDEPENDENT_AMBULATORY_CARE_PROVIDER_SITE_OTHER): Payer: BC Managed Care – PPO | Admitting: Pharmacist

## 2012-07-29 DIAGNOSIS — I359 Nonrheumatic aortic valve disorder, unspecified: Secondary | ICD-10-CM

## 2012-07-29 DIAGNOSIS — Z7901 Long term (current) use of anticoagulants: Secondary | ICD-10-CM

## 2012-07-29 DIAGNOSIS — Z952 Presence of prosthetic heart valve: Secondary | ICD-10-CM

## 2012-07-29 DIAGNOSIS — Z954 Presence of other heart-valve replacement: Secondary | ICD-10-CM

## 2012-07-29 LAB — POCT INR: INR: 2.4

## 2012-08-20 ENCOUNTER — Other Ambulatory Visit: Payer: Self-pay | Admitting: Cardiology

## 2012-08-20 NOTE — Telephone Encounter (Signed)
New Problem:    Patient's wife called in needing a refill of the patient's warfarin (COUMADIN) 7.5 MG tablet.

## 2012-09-09 ENCOUNTER — Ambulatory Visit (INDEPENDENT_AMBULATORY_CARE_PROVIDER_SITE_OTHER): Payer: BC Managed Care – PPO | Admitting: *Deleted

## 2012-09-09 DIAGNOSIS — Z954 Presence of other heart-valve replacement: Secondary | ICD-10-CM

## 2012-09-09 DIAGNOSIS — I359 Nonrheumatic aortic valve disorder, unspecified: Secondary | ICD-10-CM

## 2012-09-09 DIAGNOSIS — Z7901 Long term (current) use of anticoagulants: Secondary | ICD-10-CM

## 2012-09-09 DIAGNOSIS — Z952 Presence of prosthetic heart valve: Secondary | ICD-10-CM

## 2012-09-09 LAB — POCT INR: INR: 2.1

## 2012-09-22 ENCOUNTER — Other Ambulatory Visit: Payer: Self-pay | Admitting: *Deleted

## 2012-09-22 MED ORDER — WARFARIN SODIUM 7.5 MG PO TABS: ORAL_TABLET | ORAL | Status: DC

## 2012-10-21 ENCOUNTER — Ambulatory Visit (INDEPENDENT_AMBULATORY_CARE_PROVIDER_SITE_OTHER): Payer: BC Managed Care – PPO

## 2012-10-21 ENCOUNTER — Telehealth: Payer: Self-pay | Admitting: Cardiology

## 2012-10-21 DIAGNOSIS — I359 Nonrheumatic aortic valve disorder, unspecified: Secondary | ICD-10-CM

## 2012-10-21 DIAGNOSIS — Z7901 Long term (current) use of anticoagulants: Secondary | ICD-10-CM

## 2012-10-21 DIAGNOSIS — Z954 Presence of other heart-valve replacement: Secondary | ICD-10-CM

## 2012-10-21 DIAGNOSIS — Z952 Presence of prosthetic heart valve: Secondary | ICD-10-CM

## 2012-11-04 NOTE — Telephone Encounter (Signed)
lmtcb X2 Mylo Red RN

## 2012-12-02 ENCOUNTER — Ambulatory Visit (INDEPENDENT_AMBULATORY_CARE_PROVIDER_SITE_OTHER): Payer: BC Managed Care – PPO

## 2012-12-02 DIAGNOSIS — Z7901 Long term (current) use of anticoagulants: Secondary | ICD-10-CM

## 2012-12-02 DIAGNOSIS — Z952 Presence of prosthetic heart valve: Secondary | ICD-10-CM

## 2012-12-02 DIAGNOSIS — I359 Nonrheumatic aortic valve disorder, unspecified: Secondary | ICD-10-CM

## 2012-12-02 DIAGNOSIS — Z954 Presence of other heart-valve replacement: Secondary | ICD-10-CM

## 2012-12-02 LAB — POCT INR: INR: 2.5

## 2012-12-08 ENCOUNTER — Encounter: Payer: Self-pay | Admitting: Cardiology

## 2012-12-08 ENCOUNTER — Ambulatory Visit (INDEPENDENT_AMBULATORY_CARE_PROVIDER_SITE_OTHER): Payer: BC Managed Care – PPO | Admitting: Cardiology

## 2012-12-08 VITALS — BP 132/82 | HR 84 | Ht 74.0 in | Wt 331.0 lb

## 2012-12-08 DIAGNOSIS — E669 Obesity, unspecified: Secondary | ICD-10-CM

## 2012-12-08 DIAGNOSIS — G4733 Obstructive sleep apnea (adult) (pediatric): Secondary | ICD-10-CM

## 2012-12-08 DIAGNOSIS — I10 Essential (primary) hypertension: Secondary | ICD-10-CM

## 2012-12-08 DIAGNOSIS — Z7901 Long term (current) use of anticoagulants: Secondary | ICD-10-CM

## 2012-12-08 DIAGNOSIS — I359 Nonrheumatic aortic valve disorder, unspecified: Secondary | ICD-10-CM

## 2012-12-08 DIAGNOSIS — E785 Hyperlipidemia, unspecified: Secondary | ICD-10-CM

## 2012-12-08 NOTE — Assessment & Plan Note (Signed)
Stable status post  aortic valve replacement. Return to the office to see Dr. Excell Seltzer in one year.

## 2012-12-08 NOTE — Progress Notes (Signed)
HPI Mr [name] returns today for evaluation and management of his history of aortic valve replacement. He also has hypertension and hyperlipidemia managed by primary care.  He offers no complaints today. He denies any chest pain, palpitations, dyspnea on exertion. He's very compliant with his medications.  Past Medical History  Diagnosis Date  . S/P aortic valve replacement     HX of St. Jude  . Chest pain, atypical   . Hypertension     Unspecified  . Hyperlipidemia     Mixed  . Obesity   . Sleep apnea, obstructive     Current Outpatient Prescriptions  Medication Sig Dispense Refill  . aspirin 81 MG EC tablet Take 81 mg by mouth daily.        Marland Kitchen gabapentin (NEURONTIN) 100 MG capsule Take 3 capsules by mouth at bedtime.      . metoprolol (TOPROL-XL) 50 MG 24 hr tablet Take 50 mg by mouth daily.       . Multiple Vitamins-Minerals (CENTRUM SILVER PO) Take 1 tablet by mouth daily.       . rosuvastatin (CRESTOR) 10 MG tablet Take 10 mg by mouth. 3 days a week on MWF      . warfarin (COUMADIN) 7.5 MG tablet Take as directed by coumadin clinic  90 tablet  1   No current facility-administered medications for this visit.    No Known Allergies  Family History  Problem Relation Age of Onset  . Cervical cancer Mother   . Coronary artery disease Mother   . Diabetes Mother   . Diabetes Father   . Cervical cancer Sister     History   Social History  . Marital Status: Married    Spouse Name: N/A    Number of Children: 2  . Years of Education: N/A   Occupational History  . 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 Smoking status: Former Smoker -- 0.10 packs/day for 18 years    Types: Cigarettes    Quit date: 12/07/1989  . Smokeless tobacco: Former Neurosurgeon  . Alcohol Use: No     Comment: Quit 1991  . Drug Use: No  . Sexually Active: Not on file   Other Topics Concern  . Not on file   Social History Narrative   Married   No regular exercise    ROS ALL NEGATIVE EXCEPT THOSE NOTED IN HPI  PE  General Appearance: well developed, well nourished in no acute distress, Muscular, obese HEENT: symmetrical face, PERRLA, good dentition  Neck: no JVD, thyromegaly, or adenopathy, trachea midline Chest: symmetric without deformity Cardiac: PMI non-displaced, RRR, normal S1, prosthetic S2,, no gallop or murmur Lung: clear to ausculation and percussion Vascular: all pulses full without bruits  Abdominal: nondistended, nontender, good bowel sounds, no HSM, no bruits Extremities: no cyanosis, clubbing, chronic edematous changes,, no sign of DVT, no varicosities  Skin: normal color, no rashes Neuro: alert and oriented x 3, non-focal Pysch: normal affect  EKG Normal sinus rhythm with brief runs of PACs. Because of an atrial tachycardia. First-degree block. BMET    Component Value Date/Time   NA 140 10/16/2010 1658   K 3.6 10/16/2010 1658   CL 106 10/16/2010 1658   CO2 28 10/16/2010 1658   GLUCOSE 80 10/16/2010 1658   BUN 14 10/16/2010 1658   CREATININE 0.69 10/16/2010 1658   CALCIUM 9.2 10/16/2010 1658   GFRNONAA >60 10/16/2010 1658  GFRAA  Value: >60        The eGFR has been calculated using the MDRD equation. This calculation has not been validated in all clinical situations. eGFR's persistently <60 mL/min signify possible Chronic Kidney Disease. 10/16/2010 1658    Lipid Panel  No results found for this basename: chol, trig, hdl, cholhdl, vldl, ldlcalc    CBC    Component Value Date/Time   WBC 8.1 10/16/2010 1658   RBC 4.33 10/16/2010 1658   HGB 12.2* 10/16/2010 1658   HCT 38.6* 10/16/2010 1658   PLT 177 10/16/2010 1658   MCV 89.1 10/16/2010 1658   MCH 28.2 10/16/2010 1658   MCHC 31.6 10/16/2010 1658   RDW 14.4 10/16/2010 1658   LYMPHSABS 2.0 10/16/2010 1658   MONOABS 1.0 10/16/2010 1658   EOSABS 0.1 10/16/2010 1658   BASOSABS 0.1 10/16/2010 1658

## 2012-12-08 NOTE — Assessment & Plan Note (Signed)
Good control. No changes made. 

## 2012-12-08 NOTE — Patient Instructions (Addendum)
Your physician recommends that you continue on your current medications as directed. Please refer to the Current Medication list given to you today.  Your physician wants you to follow-up in: 1 year with Dr. Cooper.  You will receive a reminder letter in the mail two months in advance. If you don't receive a letter, please call our office to schedule the follow-up appointment.   

## 2013-01-13 ENCOUNTER — Ambulatory Visit (INDEPENDENT_AMBULATORY_CARE_PROVIDER_SITE_OTHER): Payer: BC Managed Care – PPO

## 2013-01-13 DIAGNOSIS — Z954 Presence of other heart-valve replacement: Secondary | ICD-10-CM

## 2013-01-13 DIAGNOSIS — I359 Nonrheumatic aortic valve disorder, unspecified: Secondary | ICD-10-CM

## 2013-01-13 DIAGNOSIS — Z7901 Long term (current) use of anticoagulants: Secondary | ICD-10-CM

## 2013-01-13 DIAGNOSIS — Z952 Presence of prosthetic heart valve: Secondary | ICD-10-CM

## 2013-02-06 DIAGNOSIS — I119 Hypertensive heart disease without heart failure: Secondary | ICD-10-CM | POA: Insufficient documentation

## 2013-02-06 DIAGNOSIS — E7800 Pure hypercholesterolemia, unspecified: Secondary | ICD-10-CM

## 2013-02-06 HISTORY — DX: Pure hypercholesterolemia, unspecified: E78.00

## 2013-02-23 ENCOUNTER — Telehealth: Payer: Self-pay | Admitting: Cardiovascular Disease

## 2013-02-23 NOTE — Telephone Encounter (Signed)
New problem   [name]/Dr Schooler want to know if Lovenox is needed prior to pt's colonoscopy. Please call [name]

## 2013-02-23 NOTE — Telephone Encounter (Signed)
Ok to hold warfarin 4-5 days prior to colonoscopy and start back the day of the procedure if ok with the gastroenterologist. Doesn't need lovenox for a short interruption of warfarin. thx

## 2013-02-23 NOTE — Telephone Encounter (Signed)
12/06/2004 OPERATIVE PROCEDURE: Emergency median sternotomy, extracorporeal  circulation, replacement of aortic valve and ascending aortic dissecting  aneurysm using a 27-mm St. Jude mechanical valve conduit with reimplantation  of both coronary arteries.  I will forward this information to Dr Excell Seltzer to advise about lovenox. Dr Excell Seltzer has not seen this patient and will be taking over his care since Dr Daleen Squibb retired.

## 2013-02-24 ENCOUNTER — Ambulatory Visit (INDEPENDENT_AMBULATORY_CARE_PROVIDER_SITE_OTHER): Payer: BC Managed Care – PPO | Admitting: *Deleted

## 2013-02-24 ENCOUNTER — Encounter: Payer: Self-pay | Admitting: *Deleted

## 2013-02-24 DIAGNOSIS — I359 Nonrheumatic aortic valve disorder, unspecified: Secondary | ICD-10-CM

## 2013-02-24 DIAGNOSIS — Z954 Presence of other heart-valve replacement: Secondary | ICD-10-CM

## 2013-02-24 DIAGNOSIS — Z952 Presence of prosthetic heart valve: Secondary | ICD-10-CM

## 2013-02-24 DIAGNOSIS — Z7901 Long term (current) use of anticoagulants: Secondary | ICD-10-CM

## 2013-02-24 NOTE — Telephone Encounter (Signed)
I spoke with Melissa and made her aware of Dr Earmon Phoenix comments.

## 2013-03-21 ENCOUNTER — Encounter (HOSPITAL_COMMUNITY): Payer: Self-pay | Admitting: Emergency Medicine

## 2013-03-21 ENCOUNTER — Emergency Department (HOSPITAL_COMMUNITY): Payer: BC Managed Care – PPO

## 2013-03-21 ENCOUNTER — Emergency Department (HOSPITAL_COMMUNITY)
Admission: EM | Admit: 2013-03-21 | Discharge: 2013-03-21 | Disposition: A | Discharge status: Home / Self Care | Payer: BC Managed Care – PPO | Attending: Emergency Medicine | Admitting: Emergency Medicine

## 2013-03-21 DIAGNOSIS — I10 Essential (primary) hypertension: Secondary | ICD-10-CM | POA: Insufficient documentation

## 2013-03-21 DIAGNOSIS — Z954 Presence of other heart-valve replacement: Secondary | ICD-10-CM | POA: Insufficient documentation

## 2013-03-21 DIAGNOSIS — Z79899 Other long term (current) drug therapy: Secondary | ICD-10-CM | POA: Insufficient documentation

## 2013-03-21 DIAGNOSIS — R011 Cardiac murmur, unspecified: Secondary | ICD-10-CM | POA: Insufficient documentation

## 2013-03-21 DIAGNOSIS — Z7901 Long term (current) use of anticoagulants: Secondary | ICD-10-CM | POA: Insufficient documentation

## 2013-03-21 DIAGNOSIS — E782 Mixed hyperlipidemia: Secondary | ICD-10-CM | POA: Insufficient documentation

## 2013-03-21 DIAGNOSIS — Y9389 Activity, other specified: Secondary | ICD-10-CM | POA: Insufficient documentation

## 2013-03-21 DIAGNOSIS — Z87891 Personal history of nicotine dependence: Secondary | ICD-10-CM | POA: Insufficient documentation

## 2013-03-21 DIAGNOSIS — E669 Obesity, unspecified: Secondary | ICD-10-CM | POA: Insufficient documentation

## 2013-03-21 DIAGNOSIS — Z7982 Long term (current) use of aspirin: Secondary | ICD-10-CM | POA: Insufficient documentation

## 2013-03-21 DIAGNOSIS — S298XXA Other specified injuries of thorax, initial encounter: Secondary | ICD-10-CM | POA: Insufficient documentation

## 2013-03-21 DIAGNOSIS — M542 Cervicalgia: Secondary | ICD-10-CM

## 2013-03-21 DIAGNOSIS — S0993XA Unspecified injury of face, initial encounter: Secondary | ICD-10-CM | POA: Insufficient documentation

## 2013-03-21 DIAGNOSIS — Y92410 Unspecified street and highway as the place of occurrence of the external cause: Secondary | ICD-10-CM | POA: Insufficient documentation

## 2013-03-21 DIAGNOSIS — R42 Dizziness and giddiness: Secondary | ICD-10-CM | POA: Insufficient documentation

## 2013-03-21 LAB — CBC
HCT: 39.4 % (ref 39.0–52.0)
Hemoglobin: 12.6 g/dL — ABNORMAL LOW (ref 13.0–17.0)
MCHC: 32 g/dL (ref 30.0–36.0)
Platelets: 189 10*3/uL (ref 150–400)
RBC: 4.41 MIL/uL (ref 4.22–5.81)
WBC: 7.6 10*3/uL (ref 4.0–10.5)

## 2013-03-21 LAB — POCT I-STAT, CHEM 8
BUN: 14 mg/dL (ref 6–23)
Calcium, Ion: 1.2 mmol/L (ref 1.12–1.23)
Chloride: 104 mEq/L (ref 96–112)
Creatinine, Ser: 1 mg/dL (ref 0.50–1.35)
Glucose, Bld: 96 mg/dL (ref 70–99)
HCT: 43 % (ref 39.0–52.0)
Hemoglobin: 14.6 g/dL (ref 13.0–17.0)
Potassium: 3.8 mEq/L (ref 3.5–5.1)
Sodium: 144 mEq/L (ref 135–145)
TCO2: 26 mmol/L (ref 0–100)

## 2013-03-21 LAB — PROTIME-INR
INR: 2.57 — ABNORMAL HIGH (ref 0.00–1.49)
Prothrombin Time: 26.7 seconds — ABNORMAL HIGH (ref 11.6–15.2)

## 2013-03-21 LAB — POCT I-STAT TROPONIN I: Troponin i, poc: 0.01 ng/mL (ref 0.00–0.08)

## 2013-03-21 MED ORDER — ONDANSETRON HCL 4 MG/2ML IJ SOLN
4.0000 mg | Freq: Once | INTRAMUSCULAR | Status: AC
  Administered 2013-03-21: 4 mg via INTRAVENOUS
  Filled 2013-03-21: qty 2

## 2013-03-21 MED ORDER — MORPHINE SULFATE 4 MG/ML IJ SOLN
4.0000 mg | Freq: Once | INTRAMUSCULAR | Status: AC
  Administered 2013-03-21: 4 mg via INTRAVENOUS
  Filled 2013-03-21: qty 1

## 2013-03-21 MED ORDER — SODIUM CHLORIDE 0.9 % IV BOLUS (SEPSIS)
1000.0000 mL | Freq: Once | INTRAVENOUS | Status: AC
  Administered 2013-03-21: 1000 mL via INTRAVENOUS

## 2013-03-21 MED ORDER — HYDROCODONE-ACETAMINOPHEN 5-325 MG PO TABS: 1.0000 | ORAL_TABLET | Freq: Three times a day (TID) | ORAL | Status: DC | PRN

## 2013-03-21 MED ORDER — IOHEXOL 350 MG/ML SOLN
100.0000 mL | Freq: Once | INTRAVENOUS | Status: AC | PRN
  Administered 2013-03-21: 100 mL via INTRAVENOUS
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
## 2013-04-01 ENCOUNTER — Other Ambulatory Visit: Payer: Self-pay | Admitting: Gastroenterology

## 2013-04-09 ENCOUNTER — Emergency Department (HOSPITAL_COMMUNITY)
Admission: EM | Admit: 2013-04-09 | Discharge: 2013-04-09 | Disposition: A | Discharge status: Home / Self Care | Payer: BC Managed Care – PPO | Attending: Emergency Medicine | Admitting: Emergency Medicine

## 2013-04-09 ENCOUNTER — Encounter (HOSPITAL_COMMUNITY): Payer: Self-pay | Admitting: Emergency Medicine

## 2013-04-09 DIAGNOSIS — I10 Essential (primary) hypertension: Secondary | ICD-10-CM | POA: Insufficient documentation

## 2013-04-09 DIAGNOSIS — Z87891 Personal history of nicotine dependence: Secondary | ICD-10-CM | POA: Insufficient documentation

## 2013-04-09 DIAGNOSIS — E782 Mixed hyperlipidemia: Secondary | ICD-10-CM | POA: Insufficient documentation

## 2013-04-09 DIAGNOSIS — Z7982 Long term (current) use of aspirin: Secondary | ICD-10-CM | POA: Insufficient documentation

## 2013-04-09 DIAGNOSIS — Z7901 Long term (current) use of anticoagulants: Secondary | ICD-10-CM | POA: Insufficient documentation

## 2013-04-09 DIAGNOSIS — Z954 Presence of other heart-valve replacement: Secondary | ICD-10-CM | POA: Insufficient documentation

## 2013-04-09 DIAGNOSIS — Y9289 Other specified places as the place of occurrence of the external cause: Secondary | ICD-10-CM | POA: Insufficient documentation

## 2013-04-09 DIAGNOSIS — Y9389 Activity, other specified: Secondary | ICD-10-CM | POA: Insufficient documentation

## 2013-04-09 DIAGNOSIS — E669 Obesity, unspecified: Secondary | ICD-10-CM | POA: Insufficient documentation

## 2013-04-09 DIAGNOSIS — T792XXA Traumatic secondary and recurrent hemorrhage and seroma, initial encounter: Secondary | ICD-10-CM

## 2013-04-09 DIAGNOSIS — X58XXXA Exposure to other specified factors, initial encounter: Secondary | ICD-10-CM | POA: Insufficient documentation

## 2013-04-09 DIAGNOSIS — Z8669 Personal history of other diseases of the nervous system and sense organs: Secondary | ICD-10-CM | POA: Insufficient documentation

## 2013-04-09 DIAGNOSIS — D689 Coagulation defect, unspecified: Secondary | ICD-10-CM | POA: Insufficient documentation

## 2013-04-09 DIAGNOSIS — Z79899 Other long term (current) drug therapy: Secondary | ICD-10-CM | POA: Insufficient documentation

## 2013-04-09 DIAGNOSIS — S81009A Unspecified open wound, unspecified knee, initial encounter: Secondary | ICD-10-CM | POA: Insufficient documentation

## 2013-04-09 LAB — CBC WITH DIFFERENTIAL/PLATELET
Basophils Absolute: 0 10*3/uL (ref 0.0–0.1)
Basophils Relative: 0 % (ref 0–1)
Eosinophils Absolute: 0.1 10*3/uL (ref 0.0–0.7)
Eosinophils Relative: 1 % (ref 0–5)
HCT: 39.6 % (ref 39.0–52.0)
Hemoglobin: 12.9 g/dL — ABNORMAL LOW (ref 13.0–17.0)
Lymphocytes Relative: 20 % (ref 12–46)
Lymphs Abs: 1.6 10*3/uL (ref 0.7–4.0)
MCH: 29.1 pg (ref 26.0–34.0)
MCHC: 32.6 g/dL (ref 30.0–36.0)
MCV: 89.2 fL (ref 78.0–100.0)
Monocytes Absolute: 0.7 10*3/uL (ref 0.1–1.0)
Monocytes Relative: 9 % (ref 3–12)
Neutro Abs: 5.5 10*3/uL (ref 1.7–7.7)
Neutrophils Relative %: 69 % (ref 43–77)
Platelets: 194 10*3/uL (ref 150–400)
RBC: 4.44 MIL/uL (ref 4.22–5.81)
RDW: 14.2 % (ref 11.5–15.5)
WBC: 8 10*3/uL (ref 4.0–10.5)

## 2013-04-09 LAB — PROTIME-INR
INR: 1.78 — ABNORMAL HIGH (ref 0.00–1.49)
Prothrombin Time: 20.2 seconds — ABNORMAL HIGH (ref 11.6–15.2)

## 2013-04-09 NOTE — ED Notes (Addendum)
Patient states he has psoriasis, was at home, scratched his R lower leg and noticed that he was bleeding uncontrollably. Patient applied bandage that is soaked with blood, and applied an ice pack with pressure wrap. Patient denies pain. Patient + for blood thinner (warfarin).

## 2013-04-09 NOTE — ED Notes (Signed)
Dr. Kohut at bedside 

## 2013-04-13 ENCOUNTER — Ambulatory Visit (INDEPENDENT_AMBULATORY_CARE_PROVIDER_SITE_OTHER): Payer: BC Managed Care – PPO | Admitting: *Deleted

## 2013-04-13 DIAGNOSIS — Z954 Presence of other heart-valve replacement: Secondary | ICD-10-CM

## 2013-04-13 DIAGNOSIS — I359 Nonrheumatic aortic valve disorder, unspecified: Secondary | ICD-10-CM

## 2013-04-13 DIAGNOSIS — Z952 Presence of prosthetic heart valve: Secondary | ICD-10-CM

## 2013-04-13 DIAGNOSIS — Z7901 Long term (current) use of anticoagulants: Secondary | ICD-10-CM

## 2013-04-13 LAB — POCT INR: INR: 3.5

## 2013-04-14 NOTE — ED Provider Notes (Signed)
CSN: [id number]     Arrival date & time 04/09/13  1635 History   First MD Initiated Contact with Patient 04/09/13 1733     Chief Complaint  Patient presents with  . uncontrollable bleeding     RLE   (Consider location/radiation/quality/duration/timing/severity/associated sxs/prior Treatment) HPI  52 year old male with bleeding wound to the left shin. Patient scratched the area and developped small bleeding sore which he was unable to control w/ local pressure. Happened shortly before arrival. He is on Coumadin for history of aortic valve replacement. Denies any bleeding from any where else. No shortness of breath. No dizziness or lightheadedness.  Past Medical History  Diagnosis Date  . S/P aortic valve replacement     HX of St. Jude  . Chest pain, atypical   . Hypertension     Unspecified  . Hyperlipidemia     Mixed  . Obesity   . Sleep apnea, obstructive    Past Surgical History  Procedure Laterality Date  . Emergency median sternotomy  12/06/2004  . Extracorporeal circulation  12/06/2004  . Replacement of aortic valve  12/06/2004  . Ascending aortic dissection aneurysm  12/06/2004    Using a 27-mm St. Jude mechanical valve conduit with reimplantation of both coronary arteries  . Intraoperative transesophageal echocardiogram  12/06/2004  . Knee surgery      R  . Corneal transplant      R    Family History  Problem Relation Age of Onset  . Cervical cancer Mother   . Coronary artery disease Mother   . Diabetes Mother   . Diabetes Father   . Cervical cancer Sister    History  Substance Use Topics  . Smoking status: Former Smoker -- 0.10 packs/day for 18 years    Types: Cigarettes    Quit date: 12/07/1989  . Smokeless tobacco: Former Neurosurgeon  . Alcohol Use: No     Comment: Quit 1991    Review of Systems  All systems reviewed and negative, other than as noted in HPI.   Allergies  Review of patient's allergies indicates no known allergies.  Home Medications    Current Outpatient Rx  Name  Route  Sig  Dispense  Refill  . aspirin 81 MG EC tablet   Oral   Take 81 mg by mouth daily.           Marland Kitchen gabapentin (NEURONTIN) 100 MG capsule   Oral   Take 3 capsules by mouth at bedtime.         Marland Kitchen HYDROcodone-acetaminophen (NORCO/VICODIN) 5-325 MG per tablet   Oral   Take 1 tablet by mouth every 8 (eight) hours as needed for pain.   11 tablet   0   . metoprolol (LOPRESSOR) 50 MG tablet   Oral   Take 50 mg by mouth every evening.         . Multiple Vitamin (MULTIVITAMIN WITH MINERALS) TABS tablet   Oral   Take 1 tablet by mouth daily.         . rosuvastatin (CRESTOR) 10 MG tablet   Oral   Take 10 mg by mouth daily.          Marland Kitchen warfarin (COUMADIN) 7.5 MG tablet   Oral   Take 7.5 mg by mouth daily.          BP 119/69  Pulse 55  Temp(Src) 98.2 F (36.8 C) (Oral)  Resp 16  SpO2 96% Physical Exam  Nursing note and vitals reviewed. Constitutional: He  appears well-developed and well-nourished. No distress.  HENT:  Head: Normocephalic and atraumatic.  Eyes: Conjunctivae are normal. Right eye exhibits no discharge. Left eye exhibits no discharge.  Neck: Neck supple.  Cardiovascular: Normal rate and regular rhythm.  Exam reveals no gallop and no friction rub.   No murmur heard. Mechanical click  Pulmonary/Chest: Effort normal and breath sounds normal. No respiratory distress.  Abdominal: Soft. He exhibits no distension. There is no tenderness.  Musculoskeletal: He exhibits no edema and no tenderness.  Neurological: He is alert.  Skin: Skin is warm and dry.  Small area of excoriation to the left anterior shin. Dry blood surrounding area, but no active bleeding.  Psychiatric: He has a normal mood and affect. His behavior is normal. Thought content normal.    ED Course  Procedures (including critical care time) Labs Review Labs Reviewed  PROTIME-INR - Abnormal; Notable for the following:    Prothrombin Time 20.2 (*)    INR  1.78 (*)    All other components within normal limits  CBC WITH DIFFERENTIAL - Abnormal; Notable for the following:    Hemoglobin 12.9 (*)    All other components within normal limits   Imaging Review No results found.  EKG Interpretation   None       MDM   1. Bleeding from wound, initial encounter   2. Anticoagulated on Coumadin    52 year old male with living wound on Coumadin. Bleeding was stopped with local pressure. INR is little bit subtherapeutic. He has a history of gout replacement. Reports compliance with his medicines. Discussed the need for patient to have repeat INR within the next couple days. Continued wound care was discussed. Outpatient followup.    Raeford Razor, MD 04/14/13 [phone]

## 2013-05-06 ENCOUNTER — Ambulatory Visit (INDEPENDENT_AMBULATORY_CARE_PROVIDER_SITE_OTHER): Payer: BC Managed Care – PPO | Admitting: *Deleted

## 2013-05-06 DIAGNOSIS — I359 Nonrheumatic aortic valve disorder, unspecified: Secondary | ICD-10-CM

## 2013-05-06 DIAGNOSIS — Z7901 Long term (current) use of anticoagulants: Secondary | ICD-10-CM

## 2013-05-06 DIAGNOSIS — Z952 Presence of prosthetic heart valve: Secondary | ICD-10-CM

## 2013-05-06 DIAGNOSIS — Z954 Presence of other heart-valve replacement: Secondary | ICD-10-CM

## 2013-05-06 LAB — POCT INR: INR: 2.8

## 2013-05-20 ENCOUNTER — Other Ambulatory Visit: Payer: Self-pay | Admitting: Cardiology

## 2013-05-20 ENCOUNTER — Other Ambulatory Visit: Payer: Self-pay | Admitting: *Deleted

## 2013-05-20 MED ORDER — WARFARIN SODIUM 7.5 MG PO TABS: 7.5000 mg | ORAL_TABLET | Freq: Every day | ORAL | Status: DC

## 2013-06-08 ENCOUNTER — Ambulatory Visit (INDEPENDENT_AMBULATORY_CARE_PROVIDER_SITE_OTHER): Payer: BC Managed Care – PPO | Admitting: Pharmacist

## 2013-06-08 DIAGNOSIS — Z7901 Long term (current) use of anticoagulants: Secondary | ICD-10-CM

## 2013-06-08 DIAGNOSIS — Z954 Presence of other heart-valve replacement: Secondary | ICD-10-CM

## 2013-06-08 DIAGNOSIS — I359 Nonrheumatic aortic valve disorder, unspecified: Secondary | ICD-10-CM

## 2013-06-08 DIAGNOSIS — Z952 Presence of prosthetic heart valve: Secondary | ICD-10-CM

## 2013-06-08 LAB — POCT INR: INR: 3

## 2013-06-29 DIAGNOSIS — R0683 Snoring: Secondary | ICD-10-CM | POA: Insufficient documentation

## 2013-06-29 DIAGNOSIS — R197 Diarrhea, unspecified: Secondary | ICD-10-CM | POA: Insufficient documentation

## 2013-06-29 DIAGNOSIS — I517 Cardiomegaly: Secondary | ICD-10-CM | POA: Insufficient documentation

## 2013-07-08 ENCOUNTER — Ambulatory Visit (INDEPENDENT_AMBULATORY_CARE_PROVIDER_SITE_OTHER): Payer: BC Managed Care – PPO | Admitting: Pharmacist

## 2013-07-08 DIAGNOSIS — Z7901 Long term (current) use of anticoagulants: Secondary | ICD-10-CM

## 2013-07-08 DIAGNOSIS — I359 Nonrheumatic aortic valve disorder, unspecified: Secondary | ICD-10-CM

## 2013-07-08 DIAGNOSIS — Z952 Presence of prosthetic heart valve: Secondary | ICD-10-CM

## 2013-07-08 DIAGNOSIS — Z954 Presence of other heart-valve replacement: Secondary | ICD-10-CM

## 2013-07-08 DIAGNOSIS — Z5181 Encounter for therapeutic drug level monitoring: Secondary | ICD-10-CM | POA: Insufficient documentation

## 2013-07-08 LAB — POCT INR: INR: 3.8

## 2013-07-29 ENCOUNTER — Ambulatory Visit (INDEPENDENT_AMBULATORY_CARE_PROVIDER_SITE_OTHER): Payer: BC Managed Care – PPO | Admitting: *Deleted

## 2013-07-29 DIAGNOSIS — Z952 Presence of prosthetic heart valve: Secondary | ICD-10-CM

## 2013-07-29 DIAGNOSIS — Z5181 Encounter for therapeutic drug level monitoring: Secondary | ICD-10-CM

## 2013-07-29 DIAGNOSIS — Z954 Presence of other heart-valve replacement: Secondary | ICD-10-CM

## 2013-07-29 DIAGNOSIS — I359 Nonrheumatic aortic valve disorder, unspecified: Secondary | ICD-10-CM

## 2013-07-29 DIAGNOSIS — Z7901 Long term (current) use of anticoagulants: Secondary | ICD-10-CM

## 2013-07-29 LAB — POCT INR: INR: 2.6

## 2013-08-26 ENCOUNTER — Ambulatory Visit (INDEPENDENT_AMBULATORY_CARE_PROVIDER_SITE_OTHER): Payer: BC Managed Care – PPO | Admitting: Pharmacist

## 2013-08-26 DIAGNOSIS — Z954 Presence of other heart-valve replacement: Secondary | ICD-10-CM

## 2013-08-26 DIAGNOSIS — Z5181 Encounter for therapeutic drug level monitoring: Secondary | ICD-10-CM

## 2013-08-26 DIAGNOSIS — Z952 Presence of prosthetic heart valve: Secondary | ICD-10-CM

## 2013-08-26 DIAGNOSIS — Z7901 Long term (current) use of anticoagulants: Secondary | ICD-10-CM

## 2013-08-26 DIAGNOSIS — I359 Nonrheumatic aortic valve disorder, unspecified: Secondary | ICD-10-CM

## 2013-08-26 LAB — POCT INR: INR: 3

## 2013-09-23 ENCOUNTER — Ambulatory Visit (INDEPENDENT_AMBULATORY_CARE_PROVIDER_SITE_OTHER): Payer: BC Managed Care – PPO | Admitting: *Deleted

## 2013-09-23 DIAGNOSIS — I359 Nonrheumatic aortic valve disorder, unspecified: Secondary | ICD-10-CM

## 2013-09-23 DIAGNOSIS — Z7901 Long term (current) use of anticoagulants: Secondary | ICD-10-CM

## 2013-09-23 DIAGNOSIS — Z952 Presence of prosthetic heart valve: Secondary | ICD-10-CM

## 2013-09-23 DIAGNOSIS — Z5181 Encounter for therapeutic drug level monitoring: Secondary | ICD-10-CM

## 2013-09-23 DIAGNOSIS — Z954 Presence of other heart-valve replacement: Secondary | ICD-10-CM

## 2013-09-23 LAB — POCT INR: INR: 2.6

## 2013-10-26 ENCOUNTER — Other Ambulatory Visit: Payer: Self-pay | Admitting: Cardiovascular Disease

## 2013-10-27 ENCOUNTER — Other Ambulatory Visit: Payer: Self-pay | Admitting: *Deleted

## 2013-10-27 ENCOUNTER — Telehealth: Payer: Self-pay | Admitting: Cardiology

## 2013-10-27 MED ORDER — WARFARIN SODIUM 7.5 MG PO TABS: ORAL_TABLET | ORAL | Status: DC

## 2013-10-27 NOTE — Telephone Encounter (Signed)
Patient has questions about medication. Insisted to speak with coumadin clinic. Please call and advise.

## 2013-10-27 NOTE — Telephone Encounter (Signed)
Telephoned pt back, spoke with spouse, she said he needed a Refill sent in .

## 2013-11-04 ENCOUNTER — Ambulatory Visit (INDEPENDENT_AMBULATORY_CARE_PROVIDER_SITE_OTHER): Payer: BC Managed Care – PPO | Admitting: Surgery

## 2013-11-04 DIAGNOSIS — Z7901 Long term (current) use of anticoagulants: Secondary | ICD-10-CM

## 2013-11-04 DIAGNOSIS — I359 Nonrheumatic aortic valve disorder, unspecified: Secondary | ICD-10-CM

## 2013-11-04 DIAGNOSIS — Z5181 Encounter for therapeutic drug level monitoring: Secondary | ICD-10-CM

## 2013-11-04 DIAGNOSIS — Z952 Presence of prosthetic heart valve: Secondary | ICD-10-CM

## 2013-11-04 DIAGNOSIS — Z954 Presence of other heart-valve replacement: Secondary | ICD-10-CM

## 2013-11-04 LAB — POCT INR: INR: 2.7

## 2013-11-08 ENCOUNTER — Encounter (HOSPITAL_COMMUNITY): Payer: Self-pay | Admitting: Emergency Medicine

## 2013-11-08 ENCOUNTER — Emergency Department (HOSPITAL_COMMUNITY)
Admission: EM | Admit: 2013-11-08 | Discharge: 2013-11-08 | Disposition: A | Discharge status: Nursing Home (Includes ICF) | Payer: BC Managed Care – PPO | Source: Home / Self Care | Attending: Family Medicine | Admitting: Family Medicine

## 2013-11-08 ENCOUNTER — Emergency Department (HOSPITAL_COMMUNITY): Payer: BC Managed Care – PPO

## 2013-11-08 ENCOUNTER — Emergency Department (HOSPITAL_COMMUNITY)
Admission: EM | Admit: 2013-11-08 | Discharge: 2013-11-08 | Disposition: A | Discharge status: Home / Self Care | Payer: BC Managed Care – PPO | Attending: Emergency Medicine | Admitting: Emergency Medicine

## 2013-11-08 DIAGNOSIS — R0602 Shortness of breath: Secondary | ICD-10-CM

## 2013-11-08 DIAGNOSIS — M25519 Pain in unspecified shoulder: Secondary | ICD-10-CM | POA: Insufficient documentation

## 2013-11-08 DIAGNOSIS — I498 Other specified cardiac arrhythmias: Secondary | ICD-10-CM | POA: Insufficient documentation

## 2013-11-08 DIAGNOSIS — Z7982 Long term (current) use of aspirin: Secondary | ICD-10-CM | POA: Insufficient documentation

## 2013-11-08 DIAGNOSIS — L819 Disorder of pigmentation, unspecified: Secondary | ICD-10-CM | POA: Insufficient documentation

## 2013-11-08 DIAGNOSIS — I471 Supraventricular tachycardia: Secondary | ICD-10-CM

## 2013-11-08 DIAGNOSIS — R609 Edema, unspecified: Secondary | ICD-10-CM | POA: Insufficient documentation

## 2013-11-08 DIAGNOSIS — I4891 Unspecified atrial fibrillation: Secondary | ICD-10-CM

## 2013-11-08 DIAGNOSIS — Z7901 Long term (current) use of anticoagulants: Secondary | ICD-10-CM

## 2013-11-08 DIAGNOSIS — E785 Hyperlipidemia, unspecified: Secondary | ICD-10-CM | POA: Insufficient documentation

## 2013-11-08 DIAGNOSIS — I10 Essential (primary) hypertension: Secondary | ICD-10-CM | POA: Insufficient documentation

## 2013-11-08 DIAGNOSIS — Z952 Presence of prosthetic heart valve: Secondary | ICD-10-CM

## 2013-11-08 DIAGNOSIS — M25512 Pain in left shoulder: Secondary | ICD-10-CM

## 2013-11-08 DIAGNOSIS — Z79899 Other long term (current) drug therapy: Secondary | ICD-10-CM | POA: Insufficient documentation

## 2013-11-08 DIAGNOSIS — Z87891 Personal history of nicotine dependence: Secondary | ICD-10-CM | POA: Insufficient documentation

## 2013-11-08 DIAGNOSIS — Z9889 Other specified postprocedural states: Secondary | ICD-10-CM | POA: Insufficient documentation

## 2013-11-08 DIAGNOSIS — E669 Obesity, unspecified: Secondary | ICD-10-CM | POA: Insufficient documentation

## 2013-11-08 DIAGNOSIS — Z954 Presence of other heart-valve replacement: Secondary | ICD-10-CM | POA: Insufficient documentation

## 2013-11-08 LAB — CBC
HCT: 41.7 % (ref 39.0–52.0)
HEMOGLOBIN: 13.3 g/dL (ref 13.0–17.0)
MCH: 29.1 pg (ref 26.0–34.0)
MCHC: 31.9 g/dL (ref 30.0–36.0)
MCV: 91.2 fL (ref 78.0–100.0)
Platelets: 221 10*3/uL (ref 150–400)
RBC: 4.57 MIL/uL (ref 4.22–5.81)
RDW: 15.4 % (ref 11.5–15.5)
WBC: 7.2 10*3/uL (ref 4.0–10.5)

## 2013-11-08 LAB — PROTIME-INR
INR: 2.15 — ABNORMAL HIGH (ref 0.00–1.49)
Prothrombin Time: 23.3 seconds — ABNORMAL HIGH (ref 11.6–15.2)

## 2013-11-08 LAB — BASIC METABOLIC PANEL
BUN: 13 mg/dL (ref 6–23)
CO2: 29 meq/L (ref 19–32)
Calcium: 9.3 mg/dL (ref 8.4–10.5)
Chloride: 102 mEq/L (ref 96–112)
Creatinine, Ser: 0.79 mg/dL (ref 0.50–1.35)
GFR calc Af Amer: >90 mL/min (ref >90)
GLUCOSE: 94 mg/dL (ref 70–99)
POTASSIUM: 3.7 meq/L (ref 3.7–5.3)
Sodium: 142 mEq/L (ref 137–147)

## 2013-11-08 LAB — I-STAT TROPONIN, ED: Troponin i, poc: 0 ng/mL (ref 0.00–0.08)

## 2013-11-08 LAB — PRO B NATRIURETIC PEPTIDE: Pro B Natriuretic peptide (BNP): 21.9 pg/mL (ref 0–125)

## 2013-11-08 MED ORDER — NITROGLYCERIN 2 % TD OINT
1.0000 [in_us] | TOPICAL_OINTMENT | Freq: Once | TRANSDERMAL | Status: AC
  Administered 2013-11-08: 1 [in_us] via TOPICAL
  Filled 2013-11-08: qty 1

## 2013-11-08 MED ORDER — METHOCARBAMOL 500 MG PO TABS: 500.0000 mg | ORAL_TABLET | Freq: Two times a day (BID) | ORAL | Status: DC | PRN

## 2013-11-08 MED ORDER — METHOCARBAMOL 500 MG PO TABS
1000.0000 mg | ORAL_TABLET | Freq: Once | ORAL | Status: AC
  Administered 2013-11-08: 1000 mg via ORAL
  Filled 2013-11-08: qty 2

## 2013-11-08 NOTE — ED Notes (Signed)
Per pt has been having constant shoulder/chest pain since Tuesday. Sent here from [facility] with new onset of afib.

## 2013-11-08 NOTE — ED Notes (Signed)
Patient complains of left shoulder pain that started a week ago; states took tylenol for pain, but pain has gotten progressively worse; states shortness of breath while lying back; states headaches during the past week.  History of Hypertension.

## 2013-11-08 NOTE — ED Provider Notes (Signed)
CSN: [id number]     Arrival date & time 11/08/13  1615 History   First MD Initiated Contact with Patient 11/08/13 1644     Chief Complaint  Patient presents with  . Shoulder Pain  . Atrial Fibrillation     (Consider location/radiation/quality/duration/timing/severity/associated sxs/prior Treatment) HPI  [name] is a 53 y.o. male with past medical history significant for aortic valve replacements (anticoagulated on Coumadin, last INR 2.7 as per patient) complaining of left shoulder pain radiating down the arm onset 7 days ago. Patient was seen at urgent care, given tramadol, patient reports shortness of breath onset yesterday, endorses 3 pillow orthopnea for 2 weeks and mildly increasing peripheral edema, denies PND. Patient describes his pain as shooting, it is not exertional, he states it shoots across between the shoulder blades in the back, rates it a 9/10, tenths he was maximal at onset. On review of systems patient endorses a racing heart palpitation for 2-3 days, he denies cough or fever. Patient had full dose aspirin this a.m. He is to follow with cardiologist Dr. Elizebeth Brookingotton on June 10, and this will be his first appointment he was formally followed by Dr. wall. As per his wife had a catheterization in 2006 with no stent placement. 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   Past Medical History  Diagnosis Date  . S/P aortic valve replacement     HX of St. Jude  . Chest pain, atypical   . Hypertension     Unspecified  . Hyperlipidemia     Mixed  . Obesity   . Sleep apnea, obstructive    Past Surgical History  Procedure Laterality Date  . Emergency median sternotomy  12/06/2004  . Extracorporeal circulation  12/06/2004  . Replacement of aortic valve  12/06/2004  . Ascending aortic dissection aneurysm  12/06/2004    Using a 27-mm St. Jude mechanical valve conduit with reimplantation of both coronary arteries   . Intraoperative transesophageal echocardiogram  12/06/2004  . Knee surgery      R  . Corneal transplant      R    Family History  Problem Relation Age of Onset  . Cervical cancer Mother   . Coronary artery disease Mother   . Diabetes Mother   . Diabetes Father   . Cervical cancer Sister    History  Substance Use Topics  . Smoking status: Former Smoker -- 0.10 packs/day for 18 years    Types: Cigarettes    Quit date: 12/07/1989  . Smokeless tobacco: Former NeurosurgeonUser  . Alcohol Use: No     Comment: Quit 1991    Review of Systems  10 systems reviewed and found to be negative, except as noted in the HPI.  Allergies  Review of patient's allergies indicates no known allergies.  Home Medications   Prior to Admission medications   Medication Sig Start Date End Date Taking? Authorizing Provider  aspirin 81 MG EC tablet Take 81 mg by mouth daily.     Yes Historical Provider, MD  gabapentin (NEURONTIN) 100 MG capsule Take 300 mg by mouth at bedtime.  10/01/12  Yes Historical Provider, MD  metoprolol succinate (TOPROL-XL) 50 MG 24 hr tablet Take 50 mg by mouth every evening. 08/29/13  Yes Historical Provider, MD  Multiple Vitamin (MULTIVITAMIN WITH MINERALS) TABS tablet Take 1 tablet by mouth daily.   Yes Historical Provider, MD  rosuvastatin (CRESTOR) 10 MG tablet Take  10 mg by mouth every Monday, Wednesday, and Friday.    Yes Historical Provider, MD  traMADol (ULTRAM) 50 MG tablet Take 50 mg by mouth every 6 (six) hours as needed for moderate pain.    Yes Historical Provider, MD  warfarin (COUMADIN) 7.5 MG tablet Take 3.25-7.5 mg by mouth daily. 1 tablet every day of the week except half a tablet on Friday   Yes Historical Provider, MD   BP 142/103  Pulse 71  Resp 18  SpO2 94% Physical Exam  Nursing note and vitals reviewed. Constitutional: He is oriented to person, place, and time. He appears well-developed and well-nourished. No distress.  Obese  HENT:  Head: Normocephalic.   Eyes: Conjunctivae and EOM are normal. Pupils are equal, round, and reactive to light.  Neck: Normal range of motion. Neck supple. No JVD present.  Cardiovascular: Normal rate, regular rhythm and intact distal pulses.   Pulmonary/Chest: Effort normal and breath sounds normal. No stridor. No respiratory distress. He has no wheezes. He has no rales. He exhibits no tenderness.  Abdominal: Soft. Bowel sounds are normal. He exhibits no distension and no mass. There is no tenderness. There is no rebound and no guarding.  Musculoskeletal: Normal range of motion. He exhibits edema.  2+ pitting edema to proximal shin, equal bilaterally. Hyperpigmentation.    Neurological: He is alert and oriented to person, place, and time.  Psychiatric: He has a normal mood and affect.    ED Course  Procedures (including critical care time) Labs Review Labs Reviewed  PROTIME-INR - Abnormal; Notable for the following:    Prothrombin Time 23.3 (*)    INR 2.15 (*)    All other components within normal limits  CBC  BASIC METABOLIC PANEL  PRO B NATRIURETIC PEPTIDE  I-STAT TROPOININ, ED    Imaging Review Dg Chest 2 View  11/08/2013   CLINICAL DATA:  Left shoulder pain and chest pain.  Leg swelling.  EXAM: CHEST  2 VIEW  COMPARISON:  CT chest and chest radiograph 03/21/2013.  FINDINGS: Trachea is midline. Heart is enlarged. Lungs are clear. No pleural fluid.  IMPRESSION: No acute findings.   Electronically Signed   By: Leanna Battles M.D.   On: 11/08/2013 18:10   Dg Shoulder Left  11/08/2013   CLINICAL DATA:  Shoulder pain  EXAM: LEFT SHOULDER - 2+ VIEW  COMPARISON:  None.  FINDINGS: Glenohumeral joint is intact. No evidence of scapular fracture or humeral fracture. The acromioclavicular joint is intact.  IMPRESSION: No acute osseous abnormality.   Electronically Signed   By: Genevive Bi M.D.   On: 11/08/2013 18:14     EKG Interpretation   Date/Time:  Tuesday November 08 2013 16:24:32 EDT Ventricular Rate:   55 PR Interval:  196 QRS Duration: 98 QT Interval:  452 QTC Calculation: 432 R Axis:   51 Text Interpretation:  Sinus bradycardia Cannot rule out Anterior infarct ,  age undetermined Abnormal ECG No significant change since last tracing  Confirmed by POLLINA  MD, CHRISTOPHER [phone]) on 11/08/2013 5:42:19 PM      MDM   Final diagnoses:  Left shoulder pain  S/P aortic valve replacement  Long term current use of anticoagulant  OBESITY  Multifocal atrial tachycardia   Filed Vitals:   11/08/13 1818 11/08/13 1821 11/08/13 1910  BP: 128/76 142/85 142/103  Pulse:  77 71  Resp:  18 18  SpO2:  95% 94%    Medications  nitroGLYCERIN (NITROGLYN) 2 % ointment 1 inch (1  inch Topical Given 11/08/13 1816)  methocarbamol (ROBAXIN) tablet 1,000 mg (1,000 mg Oral Given 11/08/13 1839)    [name] is a 53 y.o. male presenting with left shoulder pain, shortness of breath, sent from urgent care for evaluation of new onset A. fib. EKG shows sinus bradycardia, clear P waves, review of the EKG from urgent care with runs of PVCs, clear P waves. Troponin, BNP, chest x-ray and other blood work unremarkable. Subtherapeutic on his INR at 2.15. Cardiology consult from [facility] appreciated: He has reviewed the EKGs and think that this most closely resembles a multifocal atrial tachycardia, no intervention is necessary at this time. Encouraged him to followup as an outpatient.  Physician air visit with attending physician who is personally evaluated the patient and agrees with care plan instability discharged home.  Evaluation does not show pathology that would require ongoing emergent intervention or inpatient treatment. Pt is hemodynamically stable and mentating appropriately. Discussed findings and plan with patient/guardian, who agrees with care plan. All questions answered. Return precautions discussed and outpatient follow up given.   Discharge Medication List as of 11/08/2013  7:11 PM    START  taking these medications   Details  methocarbamol (ROBAXIN) 500 MG tablet Take 1 tablet (500 mg total) by mouth 2 (two) times daily as needed for muscle spasms., Starting 11/08/2013, Until Discontinued, [employer], PA-C 11/09/13 0010

## 2013-11-08 NOTE — ED Notes (Signed)
MD at bedside. 

## 2013-11-08 NOTE — Discharge Instructions (Signed)
For breakthrough pain you may take Robaxin. Do not drink alcohol, drive or operate heavy machinery when taking Robaxin.  Been diagnosed with a supraventricular tachycardia, this may be a multifocal atrial tachycardia. Please follow with your cardiologist as soon as possible and asked them to review the EKGs that were done in the emergency room and urgent care today.  Follow with her primary care doctor in the next 24-48 hours for a checkup.  Do not hesitate to return to the emergency room for any new, worse or symptoms.

## 2013-11-08 NOTE — ED Provider Notes (Signed)
CSN: [id number]     Arrival date & time 11/08/13  1435 History   First MD Initiated Contact with Patient 11/08/13 1520     Chief Complaint  Patient presents with  . Shoulder Pain  . Shortness of Breath   (Consider location/radiation/quality/duration/timing/severity/associated sxs/prior Treatment) HPI Comments: 53 year old male with a history of aortic valve replacements, on Coumadin, presents complaining of left shoulder pain around the back of his left shoulder and onto the left side of his chest, as well as some shortness of breath on exertion and leg swelling. His symptoms have been present now for about a week. He went to a different urgent care and was given tramadol for the shoulder pain, it has not helped. Denies any central chest pain, pleuritic chest pain, cough. His shortness of breath and shoulder pain are both relieved by sitting upright and is somewhat exacerbated by lying flat.  Patient is a 53 y.o. male presenting with shoulder pain and shortness of breath.  Shoulder Pain Associated symptoms include shortness of breath. Pertinent negatives include no chest pain.  Shortness of Breath Associated symptoms: no chest pain, no cough and no fever     Past Medical History  Diagnosis Date  . S/P aortic valve replacement     HX of St. Jude  . Chest pain, atypical   . Hypertension     Unspecified  . Hyperlipidemia     Mixed  . Obesity   . Sleep apnea, obstructive    Past Surgical History  Procedure Laterality Date  . Emergency median sternotomy  12/06/2004  . Extracorporeal circulation  12/06/2004  . Replacement of aortic valve  12/06/2004  . Ascending aortic dissection aneurysm  12/06/2004    Using a 27-mm St. Jude mechanical valve conduit with reimplantation of both coronary arteries  . Intraoperative transesophageal echocardiogram  12/06/2004  . Knee surgery      R  . Corneal transplant      R    Family History  Problem Relation Age of Onset  . Cervical cancer Mother    . Coronary artery disease Mother   . Diabetes Mother   . Diabetes Father   . Cervical cancer Sister    History  Substance Use Topics  . Smoking status: Former Smoker -- 0.10 packs/day for 18 years    Types: Cigarettes    Quit date: 12/07/1989  . Smokeless tobacco: Former Neurosurgeon  . Alcohol Use: No     Comment: Quit 1991    Review of Systems  Constitutional: Negative for fever and chills.  Respiratory: Positive for shortness of breath. Negative for cough and chest tightness.   Cardiovascular: Positive for leg swelling. Negative for chest pain and palpitations.  Musculoskeletal: Positive for arthralgias (left shoulder pain).  All other systems reviewed and are negative.   Allergies  Review of patient's allergies indicates no known allergies.  Home Medications   Prior to Admission medications   Medication Sig Start Date End Date Taking? Authorizing Provider  aspirin 81 MG EC tablet Take 81 mg by mouth daily.     Yes Historical Provider, MD  gabapentin (NEURONTIN) 100 MG capsule Take 3 capsules by mouth at bedtime. 10/01/12  Yes Historical Provider, MD  metoprolol (LOPRESSOR) 50 MG tablet Take 50 mg by mouth every evening.   Yes Historical Provider, MD  rosuvastatin (CRESTOR) 10 MG tablet Take 10 mg by mouth daily.    Yes Historical Provider, MD  traMADol (ULTRAM) 50 MG tablet Take by mouth every 6 (six)  hours as needed.   Yes Historical Provider, MD  warfarin (COUMADIN) 7.5 MG tablet 1 tablet everyday except 1/2 tablet on Fridays or as directed by coumadin clinic 10/27/13  Yes Tonny BollmanMichael Cooper, MD  HYDROcodone-acetaminophen (NORCO/VICODIN) 5-325 MG per tablet Take 1 tablet by mouth every 8 (eight) hours as needed for pain. 03/21/13   Marissa Sciacca, PA-C  Multiple Vitamin (MULTIVITAMIN WITH MINERALS) TABS tablet Take 1 tablet by mouth daily.    Historical Provider, MD   BP 149/75  Pulse 52  Temp(Src) 97.8 F (36.6 C) (Oral)  Resp 16  SpO2 98% Physical Exam  Nursing note and  vitals reviewed. Constitutional: He is oriented to person, place, and time. He appears well-developed and well-nourished. No distress.  HENT:  Head: Normocephalic.  Cardiovascular: An irregularly irregular rhythm present.  2+ pitting edema bilateral legs up to the knees  Pulmonary/Chest: Effort normal. No respiratory distress.  Neurological: He is alert and oriented to person, place, and time. Coordination normal.  Skin: Skin is warm and dry. No rash noted. He is not diaphoretic.  Psychiatric: He has a normal mood and affect. Judgment normal.    ED Course  Procedures (including critical care time) Labs Review Labs Reviewed - No data to display  Imaging Review No results found.   MDM   1. SOB (shortness of breath)   2. Shoulder pain    Patient with new diagnosis of atrial fibrillation. Shoulder pain may be referred from the chest. He also has physical exam findings suggestive of mild congestive heart failure. He is being transferred to the emergency department for further evaluation.     Reviewed EKG after pt already left, not actually in a-fib, but still clinically has HF.    Graylon GoodZachary H Delshon Blanchfield, PA-C 11/08/13 1547  Graylon GoodZachary H Lejla Moeser, PA-C 11/08/13 [phone]

## 2013-11-08 NOTE — ED Notes (Signed)
Pt returned to room from x-ray. Vitals were taken.

## 2013-11-08 NOTE — ED Notes (Signed)
Patient refused transfer via [facility]; agreed to shuttle for transfer to [facility] ED.

## 2013-11-08 NOTE — ED Notes (Signed)
Patient transported to X-ray 

## 2013-11-08 NOTE — ED Provider Notes (Signed)
Medical screening examination/treatment/procedure(s) were performed by resident physician or non-physician practitioner and as supervising physician I was immediately available for consultation/collaboration.   Diego Ulbricht DOUGLAS MD.   Renate Danh D Perfecto Purdy, MD 11/08/13 1559 

## 2013-11-09 ENCOUNTER — Telehealth: Payer: Self-pay | Admitting: Cardiovascular Disease

## 2013-11-09 NOTE — Telephone Encounter (Signed)
Called patient and left message on his voicemail

## 2013-11-09 NOTE — Telephone Encounter (Signed)
New message     Went to [facility].  They transferred him to the ER and did an EKG.  Pt want Dr Excell Seltzer to read the ekg from yesterday and call him.  He is still having problems.  Patient want to talk to a nurse also. The ER gave the pt a note to return to work tomorrow.

## 2013-11-09 NOTE — Telephone Encounter (Signed)
Patient would like a call regarding his EKG. Please call and advise.

## 2013-11-10 ENCOUNTER — Encounter (HOSPITAL_COMMUNITY): Payer: Self-pay | Admitting: Emergency Medicine

## 2013-11-10 ENCOUNTER — Emergency Department (INDEPENDENT_AMBULATORY_CARE_PROVIDER_SITE_OTHER)
Admission: EM | Admit: 2013-11-10 | Discharge: 2013-11-10 | Disposition: A | Discharge status: Home / Self Care | Payer: BC Managed Care – PPO | Source: Home / Self Care

## 2013-11-10 DIAGNOSIS — M719 Bursopathy, unspecified: Secondary | ICD-10-CM

## 2013-11-10 DIAGNOSIS — M67919 Unspecified disorder of synovium and tendon, unspecified shoulder: Secondary | ICD-10-CM

## 2013-11-10 DIAGNOSIS — M7580 Other shoulder lesions, unspecified shoulder: Secondary | ICD-10-CM

## 2013-11-10 MED ORDER — OXYCODONE-ACETAMINOPHEN 5-325 MG PO TABS: ORAL_TABLET | ORAL | Status: DC

## 2013-11-10 MED ORDER — METHYLPREDNISOLONE ACETATE 40 MG/ML IJ SUSP
INTRAMUSCULAR | Status: AC
  Filled 2013-11-10: qty 5

## 2013-11-10 MED ORDER — TRIAMCINOLONE ACETONIDE 40 MG/ML IJ SUSP
INTRAMUSCULAR | Status: AC
  Filled 2013-11-10: qty 1

## 2013-11-10 NOTE — Telephone Encounter (Signed)
I spoke with the pt and he complains of pain in his left upper chest that radiates into his left arm down to his elbow.  This is a constant pain that worsens when lying down and with touch. I made the pt aware that this is not cardiac related. I advised the pt to touch base with PCP for further evaluation. The pt would still like Dr Excell Seltzer to give him a call if possible to discuss his EKG and symptoms.  I made the pt aware that the EKG did not show Atrial Fibrillation. The pt has an upcoming appointment with Dr Excell Seltzer on 12/16/13.  I will forward this message to Dr Excell Seltzer.

## 2013-11-10 NOTE — Telephone Encounter (Signed)
Follow up     Patient calling back to speak with Dr. Excell Seltzer .    Patient stating he still having pain in chest.

## 2013-11-10 NOTE — ED Provider Notes (Signed)
Chief Complaint   Chief Complaint  Patient presents with  . Shoulder Pain    History of Present Illness   [name] is a 53 year old male who has had a 9 day history of left shoulder pain. He denies any injury. The pain is worse with shoulder movement, abduction, flexion, and internal and external rotation. After the pain began he went to [facility] urgent care where he was given tramadol for the pain. He did not get any better so he came here a week ago. At that time an EKG was done which showed possible atrial fibrillation. He was sent to the emergency department where he had x-rays of the chest in the shoulder. He was told he did not have atrial fibrillation and was told to followup with his cardiologist. He was given a pain pill for shoulder pain. He's still having the shoulder pain. It's localized anteriorly and radiates towards the pectoral area. He also has had a number of other symptoms including headaches, dizziness, blurry vision, swelling of the legs as well as shortness of breath. Some of these have been chronic such as the shortness of breath and leg swelling. Some of them it has been going on for a few days and he attributes that to taking new medications. He denies any fever or chills.  Review of Systems   Other than as noted above, the patient denies any of the following symptoms: Systemic:  No fevers or chills. Musculoskeletal:  No joint pain, arthritis, swelling, back pain, or neck pain. No history of arthritis.  Neurological:  No muscular weakness or paresthesia.  PMFSH   Past medical history, family history, social history, meds, and allergies were reviewed.  He has a history of valvular heart disease and has a mechanical heart valve. Current meds include aspirin, gabapentin, metoprolol, Crestor, and warfarin.  Physical Examination     Vital signs:  BP 128/60  Pulse 56  Temp(Src) 97.2 F (36.2 C) (Oral)  Resp 16  SpO2 96% Gen:  Alert and oriented times  3.  In no distress. Musculoskeletal: There is pain to palpation both anteriorly and posteriorly over the shoulder and the pectoral area as well. The shoulder has a limited range of motion with 85 of abduction, 90 of flexion, and diminished internal and external rotation. Neer test was positive.  Hawkins test was positive.  Empty cans test was positive with muscle weakness. Otherwise, all joints had a full a ROM with no swelling, bruising or deformity.  No edema, pulses full. Extremities were warm and pink.  Capillary refill was brisk. There was no swelling or erythema of the skin. Skin:  Clear, warm and dry.  No rash. Neuro:  Alert and oriented times 3.  Muscle strength was normal.  Sensation was intact to light touch.   Radiology   Dg Chest 2 View  11/08/2013   CLINICAL DATA:  Left shoulder pain and chest pain.  Leg swelling.  EXAM: CHEST  2 VIEW  COMPARISON:  CT chest and chest radiograph 03/21/2013.  FINDINGS: Trachea is midline. Heart is enlarged. Lungs are clear. No pleural fluid.  IMPRESSION: No acute findings.   Electronically Signed   By: Leanna Battles M.D.   On: 11/08/2013 18:10   Dg Shoulder Left  11/08/2013   CLINICAL DATA:  Shoulder pain  EXAM: LEFT SHOULDER - 2+ VIEW  COMPARISON:  None.  FINDINGS: Glenohumeral joint is intact. No evidence of scapular fracture or humeral fracture. The acromioclavicular joint is intact.  IMPRESSION:  No acute osseous abnormality.   Electronically Signed   By: Genevive BiStewart  Edmunds M.D.   On: 11/08/2013 18:14   I reviewed the images independently and personally and concur with the radiologist's findings.  Procedure Note:  Verbal informed consent was obtained from the patient.  Risks and benefits were outlined with the patient.  Patient understands and accepts these risks. A time out was called and the procedure and identity of the patient were confirmed verbally.    The procedure was then performed as follows:  The posterior aspect of the shoulder was prepped  with Betadine and alcohol and anesthetized with ethyl chloride spray. Using a one half inch 27-gauge needle, 1 cc of Depo-Medrol 40 mg strength and 2 cc of 2% Xylocaine were injected into the subacromial space. Patient tolerated the procedure well and was given aftercare instructions.  The patient tolerated the procedure well without any immediate complications.     Assessment   The encounter diagnosis was Rotator cuff tendonitis.  Plan     1.  Meds:  The following meds were prescribed:   Discharge Medication List as of 11/10/2013  3:18 PM    START taking these medications   Details  oxyCODONE-acetaminophen (PERCOCET) 5-325 MG per tablet 1 to 2 tablets every 6 hours as needed for pain., Print        2.  Patient Education/Counseling:  The patient was given appropriate handouts, self care instructions, and instructed in symptomatic relief.  He is to rest her shoulder for the next 3 days and apply ice. After that he can begin shoulder exercises. If no better in 2 weeks, [place] [facility] [place].  3.  Follow up:  The patient was told to follow up here if no better in 3 to 4 days, or sooner if becoming worse in any way, and given some red flag symptoms such as worsening pain or new neurological symptoms which would prompt immediate return.       Reuben Likesavid C Jurnie Garritano, MD 11/10/13 [phone]

## 2013-11-10 NOTE — Discharge Instructions (Signed)

## 2013-11-10 NOTE — ED Notes (Signed)
Pt   Reports         Pain l shoulder  /  Neck   X 9   Days   Seen  sev  Times  For  Same          Seen er  sev  Days  Ago  For  poss  Cardiac  Event  -  Was  Cleared  By the  Cardiologist        Continues  To have  l  Shouler/ arm pain  With  Some  Neck pain as well

## 2013-11-10 NOTE — ED Provider Notes (Signed)
Medical screening examination/treatment/procedure(s) were conducted as a shared visit with non-physician practitioner(s) and myself.  I personally evaluated the patient during the encounter.   EKG Interpretation   Date/Time:  Tuesday November 08 2013 16:24:32 EDT Ventricular Rate:  55 PR Interval:  196 QRS Duration: 98 QT Interval:  452 QTC Calculation: 432 R Axis:   51 Text Interpretation:  Sinus bradycardia Cannot rule out Anterior infarct ,  age undetermined Abnormal ECG No significant change since last tracing  Confirmed by Olanna Percifield  MD, Daemien Fronczak [phone]) on 11/08/2013 5:42:19 PM      Patient presents to the ER for evaluation of left shoulder pain. This has been ongoing for some time and appears to be musculoskeletal in nature. It is very reproducible with range of motion and palpation. This does not appear to be cardiac in nature. Cardiac evaluation is largely unremarkable. Patient did have some irregularity in his heartbeat, felt to be atrial fibrillation at urgent care. This, including a 3 by Doctor Katrinka Blazing, cardiology, reveals that this is not atrial fibrillation. It looked to be either frequent PACs or possibly multifocal atrial tachycardia. Neither require treatment. In conjunction with cardiology, plan is to discharge the patient for followup in the office.  Gilda Crease, MD 11/10/13 [phone]

## 2013-12-16 ENCOUNTER — Encounter: Payer: Self-pay | Admitting: Cardiovascular Disease

## 2013-12-16 ENCOUNTER — Ambulatory Visit (INDEPENDENT_AMBULATORY_CARE_PROVIDER_SITE_OTHER): Payer: BC Managed Care – PPO | Admitting: Cardiovascular Disease

## 2013-12-16 ENCOUNTER — Ambulatory Visit (INDEPENDENT_AMBULATORY_CARE_PROVIDER_SITE_OTHER): Payer: BC Managed Care – PPO

## 2013-12-16 VITALS — BP 145/55 | HR 74 | Ht 74.0 in | Wt 331.4 lb

## 2013-12-16 DIAGNOSIS — I359 Nonrheumatic aortic valve disorder, unspecified: Secondary | ICD-10-CM

## 2013-12-16 DIAGNOSIS — Z952 Presence of prosthetic heart valve: Secondary | ICD-10-CM

## 2013-12-16 DIAGNOSIS — Z5181 Encounter for therapeutic drug level monitoring: Secondary | ICD-10-CM

## 2013-12-16 DIAGNOSIS — Z954 Presence of other heart-valve replacement: Secondary | ICD-10-CM

## 2013-12-16 DIAGNOSIS — Z7901 Long term (current) use of anticoagulants: Secondary | ICD-10-CM

## 2013-12-16 LAB — POCT INR: INR: 2.6

## 2013-12-16 NOTE — Patient Instructions (Signed)
Your physician recommends that you continue on your current medications as directed. Please refer to the Current Medication list given to you today.  Your physician wants you to follow-up in: 1 year ov You will receive a reminder letter in the mail two months in advance. If you don't receive a letter, please call our office to schedule the follow-up appointment.   Cardiac Diet A cardiac diet can help stop heart disease or a stroke from happening. It involves eating less unhealthy fats and eating more healthy fats.  FOODS TO AVOID OR LIMIT  Limit saturated fats. This type of fat is found in oils and dairy products, such as:  Coconut oil.  Palm oil.  Cocoa butter.  Butter.  Avoid trans-fat or hydrogenated oils. These are found in fried or pre-made baked goods, such as:  Margarine.  Pre-made cookies, cakes, and crackers.  Limit processed meats (hot dogs, deli meats, sausage) to 3 ounces a week.  Limit high-fat meats (marbled meats, fried chicken, or chicken with skin) to 3 ounces a week.  Limit salt (sodium) to 1500 milligrams a day.   Limit sweets and drinks with added sugar to no more than 5 servings a week. One serving is:  1 tablespoon of sugar.  1 tablespoon of jelly or jam.   cup sorbet.  1 cup lemonade.   cup regular soda. EAT MORE OF THE FOLLOWING FOODS Fruit  Eat 4to 5 servings a day. One serving of fruit is:  1 medium whole fruit.   cup dried fruit.   cup of fresh, frozen, or canned fruit.   cup 100% fruit juice. Vegetables  Eat 4 to 5 servings a day. One serving is:  1 cup raw leafy vegetables.   cup raw or cooked, cut-up vegetables.   cup vegetable juice. Whole Grains  Eat 3 servings a day (1 ounce equals 1 serving). Legumes (such as beans, peas, and lentils)   Eat at least 4 servings a week ( cup equals 1 serving). Nuts and Seeds   Eat at least 4 servings a week ( cup equals 1 serving). Dietary Fiber  Eat 20 to 30 grams a  day. Some foods high in dietary fiber include:  Dried beans.  Citrus fruits.  Apples, bananas.  Broccoli, Brussels sprouts, and eggplant.  Oats. Omega-3 Fats  Eat food with omega-3 fats. You can also take a dietary pill (supplement) that has 1 gram of DHA and EPA. Have 3.5 ounces of fatty fish a week, such as:  Salmon.  Mackerel.  [place] tuna.  Sardines.  Lake trout.  Herring. PREPARING YOUR FOOD  Broil, bake, steam, or roast foods. Do not fry food. Do not cook food in butter (fat).  Use non-stick cooking sprays.  Remove skin from poultry, such as chicken and [place].  Remove fat from meat.  Take the fat off the top of stews, soups, and gravy.  Use lemon or herbs to flavor food instead of using butter or margarine.  Use nonfat yogurt, salsa, or low-fat dressings for salads. Document Released: 11/25/2011 Document Reviewed: 11/25/2011 [facility] Patient Information 2015 [employer], [place]. This information is not intended to replace advice given to you by your health care provider. Make sure you discuss any questions you have with your health care provider.

## 2013-12-16 NOTE — Progress Notes (Signed)
HPI:  53 year old gentleman presenting for followup evaluation. The patient has a history of aortic stenosis and underwent aortic valve placement 9 years ago. He's also been followed for hypertension, hyperlipidemia, and obesity.  The patient is doing well at present. He denies any chest pain or shortness of breath. He complains of swelling of his feet, but this is unchanged over a long time. He denies lightheadedness, orthopnea, PND, or syncope. He's had no bleeding problems on long-term warfarin.  Outpatient Encounter Prescriptions as of 12/16/2013  Medication Sig  . aspirin 81 MG EC tablet Take 81 mg by mouth daily.    Marland Kitchen gabapentin (NEURONTIN) 100 MG capsule Take 300 mg by mouth at bedtime.   . meloxicam (MOBIC) 15 MG tablet   . methocarbamol (ROBAXIN) 500 MG tablet Take 500 mg by mouth 3 (three) times daily.   . metoprolol succinate (TOPROL-XL) 50 MG 24 hr tablet Take 50 mg by mouth every evening.  . Multiple Vitamin (MULTIVITAMIN WITH MINERALS) TABS tablet Take 1 tablet by mouth daily.  . rosuvastatin (CRESTOR) 10 MG tablet Take 10 mg by mouth every Monday, Wednesday, and Friday.   . traMADol (ULTRAM) 50 MG tablet Take 50 mg by mouth every 6 (six) hours as needed for moderate pain.   Marland Kitchen warfarin (COUMADIN) 7.5 MG tablet Take 3.25-7.5 mg by mouth daily. 1 tablet every day of the week except half a tablet on Friday  . [DISCONTINUED] methocarbamol (ROBAXIN) 500 MG tablet Take 500 mg by mouth.  . [DISCONTINUED] oxyCODONE-acetaminophen (PERCOCET) 5-325 MG per tablet 1 to 2 tablets every 6 hours as needed for pain.    No Known Allergies  Past Medical History  Diagnosis Date  . S/P aortic valve replacement     HX of St. Jude  . Chest pain, atypical   . Hypertension     Unspecified  . Hyperlipidemia     Mixed  . Obesity   . Sleep apnea, obstructive     ROS: Negative except as per HPI  BP 145/55  Pulse 74  Ht 6\' 2"  (1.88 m)  Wt 150.322 kg (331 lb 6.4 oz)  BMI 42.53  kg/m2  PHYSICAL EXAM: Pt is alert and oriented, pleasant obese male in NAD HEENT: normal Neck: JVP - normal, carotids 2+= without bruits Lungs: CTA bilaterally CV: RRR with a grade 2/6 systolic ejection murmur at the right upper sternal border, nor mechanical aortic closure sounds, and premature beats noted Abd: soft, NT, Positive BS, no hepatomegaly Ext: 1+ pretibial edema with chronic stasis changes bilaterally, distal pulses intact and equal Skin: warm/dry no rash  EKG:  11/09/2013: Sinus bradycardia 55 beats per minute, nonspecific ST abnormality.  2D Echo 01/2009: Study Conclusions  1. Left ventricle: The cavity size was normal. Wall thickness was increased in a pattern of mild LVH. Systolic function was normal. The estimated ejection fraction was in the range of 60% to 65%. Although no diagnostic regional wall motion abnormality was identified, this possibility cannot be completely excluded on the basis of this study. Features are consistent with a pseudonormal left ventricular filling pattern, with concomitant abnormal relaxation and increased filling pressure (grade 2 diastolic dysfunction). 2. Aortic valve: St Jude mechanical aortic valve is well-seated. Mean gradient is upper normal for this type of prosthetic valve. No significant regurgitation. Mean gradient: 18mm Hg (S). 3. Aorta: Status post replacement of aortic root and ascending aorta. No surgical site complications are evident. 4. Mitral valve: No regurgitation. 5. Left atrium: The atrium was  moderately dilated. 6. Pulmonary veins: Systolic blunting of pulmonary vein doppler signal. 7. Right ventricle: The cavity size was mildly dilated. Systolic function was normal. 8. Right atrium: The atrium was mildly dilated. 9. Pulmonary arteries: No TR doppler jet was measured so unable to estimate PA systolic pressure. 10. Inferior vena cava: The vessel was normal in size ; the respirophasic diameter changes were in  the normal range (= 50%); findings are consistent with normal central venous pressure. Impressions:  - Normal LV size with mild LV hypertrophy. Normal LV systolic function, EF 60-65%. Moderate diastolic dysfunction. There isa St Jude mechanical aortic valve that is well-seated, mean gradient 18mmHg. No aortic insufficiency. The RV is mildly dilated with normal systolic function.  ASSESSMENT AND PLAN: 1. Aortic valve disorder status post mechanical aortic valve replacement. The patient is stable on warfarin and low-dose aspirin. He is having no cardiac symptoms at present. I will see him back in one year for followup.  2. Hypertension. Blood pressure control. He will continue on metoprolol succinate.  3. Hyperlipidemia. Patient takes Crestor 3 days per week as his dose is limited by tolerance. Followed by his primary care physician.  4. Long-term use of anticoagulant drug. He reports no bleeding problems. Warfarin and low-dose aspirin are indicated in the setting of his mechanical aortic valve.  5. Obesity. We had a lengthy discussion about weight loss strategies today. This included specific instructions on dietary modification with starch and carbohydrate reduction as well as a graded exercise program.  [name] 12/16/2013 4:13 PM

## 2014-02-03 ENCOUNTER — Ambulatory Visit (INDEPENDENT_AMBULATORY_CARE_PROVIDER_SITE_OTHER): Payer: BC Managed Care – PPO | Admitting: Pharmacist

## 2014-02-03 DIAGNOSIS — Z7901 Long term (current) use of anticoagulants: Secondary | ICD-10-CM

## 2014-02-03 DIAGNOSIS — Z952 Presence of prosthetic heart valve: Secondary | ICD-10-CM

## 2014-02-03 DIAGNOSIS — I359 Nonrheumatic aortic valve disorder, unspecified: Secondary | ICD-10-CM

## 2014-02-03 DIAGNOSIS — Z5181 Encounter for therapeutic drug level monitoring: Secondary | ICD-10-CM

## 2014-02-03 DIAGNOSIS — Z954 Presence of other heart-valve replacement: Secondary | ICD-10-CM

## 2014-02-03 LAB — POCT INR: INR: 2.7

## 2014-03-17 ENCOUNTER — Ambulatory Visit (INDEPENDENT_AMBULATORY_CARE_PROVIDER_SITE_OTHER): Payer: BC Managed Care – PPO | Admitting: *Deleted

## 2014-03-17 DIAGNOSIS — Z954 Presence of other heart-valve replacement: Secondary | ICD-10-CM

## 2014-03-17 DIAGNOSIS — Z7901 Long term (current) use of anticoagulants: Secondary | ICD-10-CM

## 2014-03-17 DIAGNOSIS — I359 Nonrheumatic aortic valve disorder, unspecified: Secondary | ICD-10-CM

## 2014-03-17 DIAGNOSIS — Z5181 Encounter for therapeutic drug level monitoring: Secondary | ICD-10-CM

## 2014-03-17 DIAGNOSIS — Z952 Presence of prosthetic heart valve: Secondary | ICD-10-CM

## 2014-03-17 LAB — POCT INR: INR: 2.6

## 2014-03-19 ENCOUNTER — Other Ambulatory Visit: Payer: Self-pay | Admitting: Cardiovascular Disease

## 2014-04-16 ENCOUNTER — Encounter (HOSPITAL_COMMUNITY): Payer: Self-pay | Admitting: Emergency Medicine

## 2014-04-16 ENCOUNTER — Emergency Department (HOSPITAL_COMMUNITY)
Admission: EM | Admit: 2014-04-16 | Discharge: 2014-04-16 | Disposition: A | Discharge status: Home / Self Care | Payer: BC Managed Care – PPO | Attending: Emergency Medicine | Admitting: Emergency Medicine

## 2014-04-16 DIAGNOSIS — G4733 Obstructive sleep apnea (adult) (pediatric): Secondary | ICD-10-CM | POA: Insufficient documentation

## 2014-04-16 DIAGNOSIS — Z7901 Long term (current) use of anticoagulants: Secondary | ICD-10-CM | POA: Insufficient documentation

## 2014-04-16 DIAGNOSIS — E785 Hyperlipidemia, unspecified: Secondary | ICD-10-CM | POA: Insufficient documentation

## 2014-04-16 DIAGNOSIS — I10 Essential (primary) hypertension: Secondary | ICD-10-CM | POA: Insufficient documentation

## 2014-04-16 DIAGNOSIS — Z79899 Other long term (current) drug therapy: Secondary | ICD-10-CM | POA: Diagnosis not present

## 2014-04-16 DIAGNOSIS — M791 Myalgia: Secondary | ICD-10-CM | POA: Diagnosis not present

## 2014-04-16 DIAGNOSIS — Z7982 Long term (current) use of aspirin: Secondary | ICD-10-CM | POA: Diagnosis not present

## 2014-04-16 DIAGNOSIS — Z87891 Personal history of nicotine dependence: Secondary | ICD-10-CM | POA: Diagnosis not present

## 2014-04-16 DIAGNOSIS — R112 Nausea with vomiting, unspecified: Secondary | ICD-10-CM | POA: Insufficient documentation

## 2014-04-16 DIAGNOSIS — J020 Streptococcal pharyngitis: Secondary | ICD-10-CM | POA: Insufficient documentation

## 2014-04-16 DIAGNOSIS — E669 Obesity, unspecified: Secondary | ICD-10-CM | POA: Insufficient documentation

## 2014-04-16 DIAGNOSIS — R51 Headache: Secondary | ICD-10-CM | POA: Diagnosis present

## 2014-04-16 DIAGNOSIS — Z954 Presence of other heart-valve replacement: Secondary | ICD-10-CM | POA: Diagnosis not present

## 2014-04-16 LAB — RAPID STREP SCREEN (MED CTR MEBANE ONLY): Streptococcus, Group A Screen (Direct): NEGATIVE

## 2014-04-16 MED ORDER — METOCLOPRAMIDE HCL 5 MG/ML IJ SOLN
10.0000 mg | Freq: Once | INTRAMUSCULAR | Status: AC
  Administered 2014-04-16: 10 mg via INTRAVENOUS
  Filled 2014-04-16: qty 2

## 2014-04-16 MED ORDER — ONDANSETRON HCL 4 MG PO TABS: 4.0000 mg | ORAL_TABLET | Freq: Four times a day (QID) | ORAL | Status: DC

## 2014-04-16 MED ORDER — KETOROLAC TROMETHAMINE 30 MG/ML IJ SOLN
30.0000 mg | Freq: Once | INTRAMUSCULAR | Status: AC
  Administered 2014-04-16: 30 mg via INTRAVENOUS
  Filled 2014-04-16: qty 1

## 2014-04-16 MED ORDER — SODIUM CHLORIDE 0.9 % IV BOLUS (SEPSIS)
1000.0000 mL | Freq: Once | INTRAVENOUS | Status: AC
  Administered 2014-04-16: 1000 mL via INTRAVENOUS

## 2014-04-16 MED ORDER — DIPHENHYDRAMINE HCL 50 MG/ML IJ SOLN
25.0000 mg | Freq: Once | INTRAMUSCULAR | Status: AC
  Administered 2014-04-16: 25 mg via INTRAVENOUS
  Filled 2014-04-16: qty 1

## 2014-04-16 MED ORDER — PENICILLIN G BENZATHINE 1200000 UNIT/2ML IM SUSP
1.2000 10*6.[IU] | Freq: Once | INTRAMUSCULAR | Status: AC
  Administered 2014-04-16: 1.2 10*6.[IU] via INTRAMUSCULAR
  Filled 2014-04-16: qty 2

## 2014-04-16 NOTE — ED Provider Notes (Signed)
CSN: [id number]     Arrival date & time 04/16/14  [id number] History   First MD Initiated Contact with Patient 04/16/14 [phone]     Chief Complaint  Patient presents with  . Headache  . Generalized Body Aches   (Consider location/radiation/quality/duration/timing/severity/associated sxs/prior Treatment) HPI Kathrene BongoFrederick G Aliberti is a 53 yo male presenting with multiple complaints x 4 days.  He reports he began to have a headache 4 days ago and felt hot followed by chills.  The next day he began to have general muscle aches with continued headache and intermittent chills, he also felt nauseated and vomited.   Yesterday his throat began to hurt.  His most bothersome symptom today are his headache and throat pain.  He describes it as 7/10 achiness across his forehead.  He denies cough, diarrhea, blurred vision, focal numbness or weakness, chest pain or shortness of breath.  Past Medical History  Diagnosis Date  . S/P aortic valve replacement     HX of St. Jude  . Chest pain, atypical   . Hypertension     Unspecified  . Hyperlipidemia     Mixed  . Obesity   . Sleep apnea, obstructive    Past Surgical History  Procedure Laterality Date  . Emergency median sternotomy  12/06/2004  . Extracorporeal circulation  12/06/2004  . Replacement of aortic valve  12/06/2004  . Ascending aortic dissection aneurysm  12/06/2004    Using a 27-mm St. Jude mechanical valve conduit with reimplantation of both coronary arteries  . Intraoperative transesophageal echocardiogram  12/06/2004  . Knee surgery      R  . Corneal transplant      R    Family History  Problem Relation Age of Onset  . Cervical cancer Mother   . Coronary artery disease Mother   . Diabetes Mother   . Diabetes Father   . Cervical cancer Sister    History  Substance Use Topics  . Smoking status: Former Smoker -- 0.10 packs/day for 18 years    Types: Cigarettes    Quit date: 12/07/1989  . Smokeless tobacco: Former NeurosurgeonUser  . Alcohol Use: No     Comment: Quit 1991    Review of Systems  Constitutional: Positive for fever, chills and fatigue.  HENT: Positive for sore throat.   Eyes: Negative for visual disturbance.  Respiratory: Negative for cough and shortness of breath.   Cardiovascular: Negative for chest pain and leg swelling.  Gastrointestinal: Positive for nausea and vomiting. Negative for diarrhea.  Genitourinary: Negative for dysuria.  Musculoskeletal: Positive for myalgias.  Skin: Negative for rash.  Neurological: Positive for headaches. Negative for weakness and numbness.    Allergies  Review of patient's allergies indicates no known allergies.  Home Medications   Prior to Admission medications   Medication Sig Start Date End Date Taking? Authorizing Provider  aspirin 81 MG EC tablet Take 81 mg by mouth daily.      Historical Provider, MD  gabapentin (NEURONTIN) 100 MG capsule Take 300 mg by mouth at bedtime.  10/01/12   Historical Provider, MD  meloxicam (MOBIC) 15 MG tablet  12/08/13   Historical Provider, MD  methocarbamol (ROBAXIN) 500 MG tablet Take 500 mg by mouth 3 (three) times daily.  11/09/13   Historical Provider, MD  metoprolol succinate (TOPROL-XL) 50 MG 24 hr tablet Take 50 mg by mouth every evening. 08/29/13   Historical Provider, MD  Multiple Vitamin (MULTIVITAMIN WITH MINERALS) TABS tablet Take 1 tablet by mouth daily.  Historical Provider, MD  rosuvastatin (CRESTOR) 10 MG tablet Take 10 mg by mouth every Monday, Wednesday, and Friday.     Historical Provider, MD  traMADol (ULTRAM) 50 MG tablet Take 50 mg by mouth every 6 (six) hours as needed for moderate pain.     Historical Provider, MD  warfarin (COUMADIN) 7.5 MG tablet Take as directed by anticoagulation clinic 03/20/14   Tonny BollmanMichael Cooper, MD   BP 154/66 mmHg  Pulse 102  Temp(Src) 98.3 F (36.8 C) (Oral)  Resp 18  SpO2 98% Physical Exam  Constitutional: He appears well-developed and well-nourished. No distress.  HENT:  Head: Normocephalic  and atraumatic.  Mouth/Throat: No trismus in the jaw. No uvula swelling. Oropharyngeal exudate and posterior oropharyngeal erythema present. No tonsillar abscesses.    Eyes: Conjunctivae are normal. No scleral icterus.  Neck: Neck supple. No thyromegaly present.  Cardiovascular: Normal rate, regular rhythm and intact distal pulses.   Pulmonary/Chest: Effort normal and breath sounds normal. No respiratory distress. He has no wheezes. He has no rales. He exhibits no tenderness.  Abdominal: Soft. There is no tenderness.  Musculoskeletal: He exhibits no tenderness.  Lymphadenopathy:       Head (right side): Tonsillar adenopathy present.       Head (left side): Tonsillar adenopathy present.    He has cervical adenopathy.       Left cervical: Superficial cervical adenopathy present.  Neurological: He is alert.  Skin: Skin is warm and dry. No rash noted. He is not diaphoretic.  Psychiatric: He has a normal mood and affect.  Nursing note and vitals reviewed.   ED Course  Procedures (including critical care time) Labs Review Labs Reviewed  RAPID STREP SCREEN  CULTURE, GROUP A STREP   Imaging Review No results found.   EKG Interpretation None      MDM   Final diagnoses:  Strep pharyngitis   53 yo male with report of fever, tonsillar exudate, cervical lymphadenopathy, & no cough.  His rapid strep is negative but clinical exam consistent with strep pharyngitis.  He was treated in the ED for his headache with improvement of symptoms. IM PCN given. Discussed importance of encouraging PO fluids. Presentation non concerning for PTA or infxn spread to soft tissue. No trismus or uvula deviation. Specific return precautions discussed. Pt able to drink water in ED without difficulty with intact air way. Discharge instructions include PCP follow up. Pt aware of plan and in agreement.  Return precautions provided.  Filed Vitals:   04/16/14 0904 04/16/14 1127 04/16/14 1201  BP: 154/66 125/59  121/67  Pulse: 102 78 77  Temp: 98.3 F (36.8 C) 98.7 F (37.1 C)   TempSrc: Oral Oral   Resp: 18  19  SpO2: 98% 98% 99%   Meds given in ED:  Medications  penicillin g benzathine (BICILLIN LA) 1200000 UNIT/2ML injection 1.2 Million Units (not administered)  sodium chloride 0.9 % bolus 1,000 mL (1,000 mLs Intravenous New Bag/Given 04/16/14 1053)  ketorolac (TORADOL) 30 MG/ML injection 30 mg (30 mg Intravenous Given 04/16/14 1053)  metoCLOPramide (REGLAN) injection 10 mg (10 mg Intravenous Given 04/16/14 1053)  diphenhydrAMINE (BENADRYL) injection 25 mg (25 mg Intravenous Given 04/16/14 1053)    New Prescriptions   No medications on file       Harle BattiestElizabeth Pelham Hennick, NP 04/17/14 1232  Layla MawKristen N Ward, DO 04/17/14 1650

## 2014-04-16 NOTE — Discharge Instructions (Signed)
Please follow the directions provided. You were treated here with an antibiotic to treat this infection.  Be sure to follow up with your primary care provider later this week to ensure you are getting better.  Please continue to drink fluids.  You may take the zofran for nausea and tylenol for pain or fever.  Don't hesitate to return for any new, worsening or concerning symptoms.    SEEK IMMEDIATE MEDICAL CARE IF:  You develop any new symptoms such as vomiting, severe headache, stiff or painful neck, chest pain, shortness of breath, or trouble swallowing.  You develop severe throat pain, drooling, or changes in your voice.  You develop swelling of the neck, or the skin on the neck becomes red and tender.  You develop signs of dehydration, such as fatigue, dry mouth, and decreased urination.  You become increasingly sleepy, or you cannot wake up completely.

## 2014-04-16 NOTE — ED Notes (Signed)
Pt c/o headache, body aches, chills, sweats since Friday. Pt been taking over the counter Theraflu. Pt also has nausea but denies vomiting.

## 2014-04-16 NOTE — ED Notes (Signed)
Pt to be d/c 20 minutes after meds given

## 2014-04-18 LAB — CULTURE, GROUP A STREP

## 2014-04-21 ENCOUNTER — Encounter (HOSPITAL_COMMUNITY): Payer: Self-pay | Admitting: Family Medicine

## 2014-04-21 ENCOUNTER — Emergency Department (HOSPITAL_COMMUNITY): Payer: BC Managed Care – PPO

## 2014-04-21 ENCOUNTER — Emergency Department (HOSPITAL_COMMUNITY)
Admission: EM | Admit: 2014-04-21 | Discharge: 2014-04-21 | Disposition: A | Discharge status: Home / Self Care | Payer: BC Managed Care – PPO | Attending: Emergency Medicine | Admitting: Emergency Medicine

## 2014-04-21 DIAGNOSIS — Z954 Presence of other heart-valve replacement: Secondary | ICD-10-CM | POA: Diagnosis not present

## 2014-04-21 DIAGNOSIS — E782 Mixed hyperlipidemia: Secondary | ICD-10-CM | POA: Diagnosis not present

## 2014-04-21 DIAGNOSIS — E669 Obesity, unspecified: Secondary | ICD-10-CM | POA: Insufficient documentation

## 2014-04-21 DIAGNOSIS — Z87891 Personal history of nicotine dependence: Secondary | ICD-10-CM | POA: Insufficient documentation

## 2014-04-21 DIAGNOSIS — Z8669 Personal history of other diseases of the nervous system and sense organs: Secondary | ICD-10-CM | POA: Diagnosis not present

## 2014-04-21 DIAGNOSIS — I10 Essential (primary) hypertension: Secondary | ICD-10-CM | POA: Insufficient documentation

## 2014-04-21 DIAGNOSIS — R0602 Shortness of breath: Secondary | ICD-10-CM | POA: Insufficient documentation

## 2014-04-21 DIAGNOSIS — Z947 Corneal transplant status: Secondary | ICD-10-CM | POA: Diagnosis not present

## 2014-04-21 DIAGNOSIS — Z7901 Long term (current) use of anticoagulants: Secondary | ICD-10-CM | POA: Insufficient documentation

## 2014-04-21 DIAGNOSIS — R51 Headache: Secondary | ICD-10-CM | POA: Diagnosis present

## 2014-04-21 DIAGNOSIS — J029 Acute pharyngitis, unspecified: Secondary | ICD-10-CM | POA: Diagnosis not present

## 2014-04-21 DIAGNOSIS — Z791 Long term (current) use of non-steroidal anti-inflammatories (NSAID): Secondary | ICD-10-CM | POA: Diagnosis not present

## 2014-04-21 DIAGNOSIS — R112 Nausea with vomiting, unspecified: Secondary | ICD-10-CM | POA: Diagnosis not present

## 2014-04-21 DIAGNOSIS — R519 Headache, unspecified: Secondary | ICD-10-CM

## 2014-04-21 DIAGNOSIS — Z7982 Long term (current) use of aspirin: Secondary | ICD-10-CM | POA: Diagnosis not present

## 2014-04-21 DIAGNOSIS — Z79899 Other long term (current) drug therapy: Secondary | ICD-10-CM | POA: Diagnosis not present

## 2014-04-21 LAB — CBC WITH DIFFERENTIAL/PLATELET
BASOS ABS: 0 10*3/uL (ref 0.0–0.1)
Basophils Relative: 1 % (ref 0–1)
EOS PCT: 2 % (ref 0–5)
Eosinophils Absolute: 0.1 10*3/uL (ref 0.0–0.7)
HCT: 40.4 % (ref 39.0–52.0)
Hemoglobin: 12.5 g/dL — ABNORMAL LOW (ref 13.0–17.0)
Lymphocytes Relative: 29 % (ref 12–46)
Lymphs Abs: 1.8 10*3/uL (ref 0.7–4.0)
MCH: 28.7 pg (ref 26.0–34.0)
MCHC: 30.9 g/dL (ref 30.0–36.0)
MCV: 92.7 fL (ref 78.0–100.0)
Monocytes Absolute: 0.7 10*3/uL (ref 0.1–1.0)
Monocytes Relative: 11 % (ref 3–12)
NEUTROS PCT: 57 % (ref 43–77)
Neutro Abs: 3.6 10*3/uL (ref 1.7–7.7)
Platelets: 206 10*3/uL (ref 150–400)
RBC: 4.36 MIL/uL (ref 4.22–5.81)
RDW: 14.3 % (ref 11.5–15.5)
WBC: 6.2 10*3/uL (ref 4.0–10.5)

## 2014-04-21 LAB — COMPREHENSIVE METABOLIC PANEL
ALBUMIN: 3.8 g/dL (ref 3.5–5.2)
ALT: 23 U/L (ref 0–53)
AST: 13 U/L (ref 0–37)
Alkaline Phosphatase: 81 U/L (ref 39–117)
Anion gap: 9 (ref 5–15)
BUN: 15 mg/dL (ref 6–23)
CALCIUM: 8.9 mg/dL (ref 8.4–10.5)
CO2: 29 mEq/L (ref 19–32)
Chloride: 101 mEq/L (ref 96–112)
Creatinine, Ser: 0.78 mg/dL (ref 0.50–1.35)
GFR calc Af Amer: >90 mL/min (ref >90)
GFR calc non Af Amer: >90 mL/min (ref >90)
Glucose, Bld: 89 mg/dL (ref 70–99)
Potassium: 4.2 mEq/L (ref 3.7–5.3)
Sodium: 139 mEq/L (ref 137–147)
Total Bilirubin: 0.3 mg/dL (ref 0.3–1.2)
Total Protein: 7.1 g/dL (ref 6.0–8.3)

## 2014-04-21 LAB — PROTIME-INR
INR: 2.14 — AB (ref 0.00–1.49)
Prothrombin Time: 24.1 seconds — ABNORMAL HIGH (ref 11.6–15.2)

## 2014-04-21 LAB — PRO B NATRIURETIC PEPTIDE: PRO B NATRI PEPTIDE: 40.3 pg/mL (ref 0–125)

## 2014-04-21 MED ORDER — MORPHINE SULFATE 4 MG/ML IJ SOLN
4.0000 mg | Freq: Once | INTRAMUSCULAR | Status: AC
  Administered 2014-04-21: 4 mg via INTRAVENOUS
  Filled 2014-04-21: qty 1

## 2014-04-21 MED ORDER — ACETAMINOPHEN 325 MG PO TABS
650.0000 mg | ORAL_TABLET | Freq: Once | ORAL | Status: AC
  Administered 2014-04-21: 650 mg via ORAL
  Filled 2014-04-21: qty 2

## 2014-04-21 MED ORDER — SODIUM CHLORIDE 0.9 % IV BOLUS (SEPSIS)
500.0000 mL | Freq: Once | INTRAVENOUS | Status: AC
  Administered 2014-04-21: 500 mL via INTRAVENOUS

## 2014-04-21 MED ORDER — PROCHLORPERAZINE EDISYLATE 5 MG/ML IJ SOLN
10.0000 mg | Freq: Once | INTRAMUSCULAR | Status: AC
  Administered 2014-04-21: 10 mg via INTRAVENOUS
  Filled 2014-04-21: qty 2

## 2014-04-21 MED ORDER — DIPHENHYDRAMINE HCL 50 MG/ML IJ SOLN
25.0000 mg | Freq: Once | INTRAMUSCULAR | Status: AC
  Administered 2014-04-21: 25 mg via INTRAVENOUS
  Filled 2014-04-21: qty 1

## 2014-04-21 MED ORDER — PROCHLORPERAZINE EDISYLATE 5 MG/ML IJ SOLN: 10.0000 mg | Freq: Four times a day (QID) | INTRAMUSCULAR | Status: DC | PRN

## 2014-04-21 MED ORDER — HYDROCODONE-ACETAMINOPHEN 5-325 MG PO TABS: 2.0000 | ORAL_TABLET | Freq: Four times a day (QID) | ORAL | Status: DC | PRN

## 2014-04-21 NOTE — ED Provider Notes (Signed)
CSN: [id number]     Arrival date & time 04/21/14  1132 History   First MD Initiated Contact with Patient 04/21/14 1327     Chief Complaint  Patient presents with  . Headache    Patient is a 53 y.o. male presenting with headaches. The history is provided by the patient.  Headache Pain location:  Frontal Quality:  Dull (aching) Radiates to:  Does not radiate Onset quality:  Gradual Duration:  10 days Timing:  Constant Progression:  Waxing and waning Chronicity:  New Similar to prior headaches: no   Context: bright light   Relieved by:  Nothing Worsened by:  Nothing tried Ineffective treatments:  Prescription medications (Tramadol) Associated symptoms: nausea, sore throat and vomiting   Associated symptoms: no abdominal pain, no congestion, no fever, no neck pain and no neck stiffness   Nausea:    Severity:  Severe   Onset quality:  Gradual   Duration:  10 days Sore throat:    Severity:  Moderate   Onset quality:  Gradual   Progression:  Improving Vomiting:    Quality:  Stomach contents   Number of occurrences:  1   Severity:  Mild   Progression:  Resolved   Past Medical History  Diagnosis Date  . S/P aortic valve replacement     HX of St. Jude  . Chest pain, atypical   . Hypertension     Unspecified  . Hyperlipidemia     Mixed  . Obesity   . Sleep apnea, obstructive    Past Surgical History  Procedure Laterality Date  . Emergency median sternotomy  12/06/2004  . Extracorporeal circulation  12/06/2004  . Replacement of aortic valve  12/06/2004  . Ascending aortic dissection aneurysm  12/06/2004    Using a 27-mm St. Jude mechanical valve conduit with reimplantation of both coronary arteries  . Intraoperative transesophageal echocardiogram  12/06/2004  . Knee surgery      R  . Corneal transplant      R    Family History  Problem Relation Age of Onset  . Cervical cancer Mother   . Coronary artery disease Mother   . Diabetes Mother   . Diabetes Father   .  Cervical cancer Sister    History  Substance Use Topics  . Smoking status: Former Smoker -- 0.10 packs/day for 18 years    Types: Cigarettes    Quit date: 12/07/1989  . Smokeless tobacco: Former Neurosurgeon  . Alcohol Use: No     Comment: Quit 1991    Review of Systems  Constitutional: Negative for fever.  HENT: Positive for sore throat. Negative for congestion.   Gastrointestinal: Positive for nausea and vomiting. Negative for abdominal pain.  Musculoskeletal: Negative for neck pain and neck stiffness.  Neurological: Positive for headaches.  All other systems reviewed and are negative.     Allergies  Review of patient's allergies indicates no known allergies.  Home Medications   Prior to Admission medications   Medication Sig Start Date End Date Taking? Authorizing Provider  aspirin 81 MG EC tablet Take 81 mg by mouth daily.      Historical Provider, MD  cholecalciferol (VITAMIN D) 1000 UNITS tablet Take 2,000 Units by mouth daily.    Historical Provider, MD  gabapentin (NEURONTIN) 100 MG capsule Take 300 mg by mouth at bedtime.  10/01/12   Historical Provider, MD  meloxicam (MOBIC) 15 MG tablet Take 15 mg by mouth daily.  12/08/13   Historical Provider, MD  methocarbamol (ROBAXIN) 500 MG tablet Take 500 mg by mouth 3 (three) times daily.  11/09/13   Historical Provider, MD  metoprolol succinate (TOPROL-XL) 50 MG 24 hr tablet Take 50 mg by mouth every evening. 08/29/13   Historical Provider, MD  Multiple Vitamin (MULTIVITAMIN WITH MINERALS) TABS tablet Take 1 tablet by mouth daily.    Historical Provider, MD  ondansetron (ZOFRAN) 4 MG tablet Take 1 tablet (4 mg total) by mouth every 6 (six) hours. 04/16/14   Harle BattiestElizabeth Tysinger, NP  rosuvastatin (CRESTOR) 10 MG tablet Take 10 mg by mouth every Monday, Wednesday, and Friday.     Historical Provider, MD  STELARA 90 MG/ML SOSY Inject 90 mg into the skin every 3 (three) months.  04/13/14   Historical Provider, MD  traMADol (ULTRAM) 50 MG tablet  Take 50 mg by mouth every 6 (six) hours as needed for moderate pain.     Historical Provider, MD  vitamin C (ASCORBIC ACID) 500 MG tablet Take 500 mg by mouth daily.    Historical Provider, MD  warfarin (COUMADIN) 7.5 MG tablet Take as directed by anticoagulation clinic Patient taking differently: Take 7.5 mg by mouth daily at 6 PM. 7.5 mg Mo,Tu,We.Th,Sa,Su 3.75 on Fridays. 03/20/14   Tonny BollmanMichael Cooper, MD   BP 162/82 mmHg  Pulse 48  Temp(Src) 97.7 F (36.5 C) (Oral)  Resp 18  SpO2 95%   Physical Exam  Constitutional: He is oriented to person, place, and time. He appears well-developed and well-nourished. No distress.  Well appearing, awake, alert, no distress  HENT:  Head: Normocephalic and atraumatic.  Right Ear: External ear normal.  Left Ear: External ear normal.  Mouth/Throat: Oropharynx is clear and moist.  Eyes: EOM are normal.  Right pupil surgically altered; left pupil pinpoint  Neck: Normal range of motion.  Cardiovascular: Normal rate and regular rhythm.   Mechanical click  Pulmonary/Chest: Effort normal and breath sounds normal. No respiratory distress. He has no wheezes. He has no rales.  Abdominal: Soft. He exhibits no distension. There is no tenderness. There is no rebound and no guarding.  Neurological: He is alert and oriented to person, place, and time.  Face symmetric, Speech is clear, EOM normal, no tongue deviation, uvula midline; strong shoulder shrug, normal strength in all extremities in major muscle groups; finger to nose symmetric bilaterally, normal heel to shin, normal rapid alternating movement; no pronator drift  Skin: Skin is warm and dry. No rash noted. He is not diaphoretic.  Psychiatric: He has a normal mood and affect.  Vitals reviewed.   ED Course  Procedures  Labs Review  Results for orders placed or performed during the hospital encounter of 04/21/14  CBC with Differential  Result Value Ref Range   WBC 6.2 4.0 - 10.5 K/uL   RBC 4.36 4.22 -  5.81 MIL/uL   Hemoglobin 12.5 (L) 13.0 - 17.0 g/dL   HCT 10.240.4 72.539.0 - 36.652.0 %   MCV 92.7 78.0 - 100.0 fL   MCH 28.7 26.0 - 34.0 pg   MCHC 30.9 30.0 - 36.0 g/dL   RDW 44.014.3 34.711.5 - 42.515.5 %   Platelets 206 150 - 400 K/uL   Neutrophils Relative % 57 43 - 77 %   Neutro Abs 3.6 1.7 - 7.7 K/uL   Lymphocytes Relative 29 12 - 46 %   Lymphs Abs 1.8 0.7 - 4.0 K/uL   Monocytes Relative 11 3 - 12 %   Monocytes Absolute 0.7 0.1 - 1.0 K/uL   Eosinophils  Relative 2 0 - 5 %   Eosinophils Absolute 0.1 0.0 - 0.7 K/uL   Basophils Relative 1 0 - 1 %   Basophils Absolute 0.0 0.0 - 0.1 K/uL  Comprehensive metabolic panel  Result Value Ref Range   Sodium 139 137 - 147 mEq/L   Potassium 4.2 3.7 - 5.3 mEq/L   Chloride 101 96 - 112 mEq/L   CO2 29 19 - 32 mEq/L   Glucose, Bld 89 70 - 99 mg/dL   BUN 15 6 - 23 mg/dL   Creatinine, Ser 1.610.78 0.50 - 1.35 mg/dL   Calcium 8.9 8.4 - 09.610.5 mg/dL   Total Protein 7.1 6.0 - 8.3 g/dL   Albumin 3.8 3.5 - 5.2 g/dL   AST 13 0 - 37 U/L   ALT 23 0 - 53 U/L   Alkaline Phosphatase 81 39 - 117 U/L   Total Bilirubin 0.3 0.3 - 1.2 mg/dL   GFR calc non Af Amer >90 >90 mL/min   GFR calc Af Amer >90 >90 mL/min   Anion gap 9 5 - 15  Protime-INR  Result Value Ref Range   Prothrombin Time 24.1 (H) 11.6 - 15.2 seconds   INR 2.14 (H) 0.00 - 1.49  Pro b natriuretic peptide  Result Value Ref Range   Pro B Natriuretic peptide (BNP) 40.3 0 - 125 pg/mL   Imaging Review Dg Chest 2 View  04/21/2014   CLINICAL DATA:  53 year old male with 10 day history of headache. Known aortic valve prosthesis.  EXAM: CHEST  2 VIEW  COMPARISON:  Prior chest x-ray 11/08/2013  FINDINGS: Stable cardiomegaly and aortic contours. The thoracic aorta is tortuous. Patient is status post median sternotomy with evidence of aortic valve replacement. Double density sign overlying the right heart consistent with left atrial enlargement. The lungs are clear. No pleural effusion or pneumothorax. No acute osseous  abnormality.  IMPRESSION: Stable chest x-ray without evidence of acute cardiopulmonary process.   Electronically Signed   By: Malachy MoanHeath  McCullough M.D.   On: 04/21/2014 15:03   Ct Head Wo Contrast  04/21/2014   CLINICAL DATA:  Intermittent headaches for the past 9 days.  EXAM: CT HEAD WITHOUT CONTRAST  TECHNIQUE: Contiguous axial images were obtained from the base of the skull through the vertex without intravenous contrast.  COMPARISON:  Head CT 09/28/2005  FINDINGS: No acute intracranial abnormality is identified. Specifically, negative for intra or extra-axial hemorrhage, mass effect, mass lesion, hydrocephalus, or evidence of acute infarction. The skull is intact and visualized paranasal sinuses and mastoid air cells are clear.  IMPRESSION: No acute intracranial abnormality.   Electronically Signed   By: Britta MccreedySusan  Turner M.D.   On: 04/21/2014 14:56     EKG Interpretation   Date/Time:  Friday April 21 2014 14:03:09 EST Ventricular Rate:  46 PR Interval:  238 QRS Duration: 98 QT Interval:  494 QTC Calculation: 432 R Axis:   -22 Text Interpretation:  Sinus bradycardia Prolonged PR interval Borderline  left axis deviation Consider anterior infarct Similar to prior Confirmed  by Gwendolyn GrantWALDEN  MD, BLAIR ([id number]) on 04/21/2014 4:15:55 PM      MDM   Final diagnoses:  Headache  Shortness of breath    53 y.o. male with a history of aortic dissection s/p repair and aortic valve replacement with mechanical valve, HLD, HTN, obesity. Presents due to 10 days of headache. Started out with sore throat, body aches, headache (throbbing). [facility] and given [facility] migraine cocktail which improved  his headache. Was seen by his PCP and given Tramadol. Endorses some mild shortness of breath however has no chest pain, cough, fever. Had a single episode of hematemesis yesterday, now resolved. Endorses paraesthesias of the right upper extremity and over his left knee that have been intermittent.   Exam as  above. Non-focal neuro exam. Well appearing, in no acute distress.   No history of migraines or other headaches. Will obtain a CT Head.  CXR showed no acute process. EKG unchanged from prior. BNP within normal limits. INR therapeutic. CBC and CMP unremarkable. CT Head unremarkable. No concern for mass, bleed, or other acute intracranial process.   Will treat his symptoms and reassess.   On re-evaluation, the patient is sleeping, arousable to voice. States his headache is somewhat better and that the medicine has helped.   I discussed very strict return precautions with the patient -- these were given in writing as well. He is to follow up with his PCP ASAP. May need an MRI as an outpatient as he has had non-specific paraesthesias. No need for emergent MRI today as he has improved with symptomatic treatment and has a benign exam.   This case managed in conjunction with my attending, Dr. Gwendolyn Grant.     Maxine Glenn, MD 04/21/14 1711  Elwin Mocha, MD 04/29/14 [phone]

## 2014-04-21 NOTE — ED Notes (Signed)
Pt complaining of HA over the past week. [facility] and by [facility] doctor and not better. sts sensitivity to light. sts also he has been vomiting blood.

## 2014-04-21 NOTE — Discharge Instructions (Signed)
General Headache Without Cause A headache is pain or discomfort felt around the head or neck area. The specific cause of a headache may not be found. There are many causes and types of headaches. A few common ones are:  Tension headaches.  Migraine headaches.  Cluster headaches.  Chronic daily headaches. HOME CARE INSTRUCTIONS   Keep all follow-up appointments with your caregiver or any specialist referral.  Only take over-the-counter or prescription medicines for pain or discomfort as directed by your caregiver.  Lie down in a dark, quiet room when you have a headache.  Keep a headache journal to find out what may trigger your migraine headaches. For example, write down:  What you eat and drink.  How much sleep you get.  Any change to your diet or medicines.  Try massage or other relaxation techniques.  Put ice packs or heat on the head and neck. Use these 3 to 4 times per day for 15 to 20 minutes each time, or as needed.  Limit stress.  Sit up straight, and do not tense your muscles.  Quit smoking if you smoke.  Limit alcohol use.  Decrease the amount of caffeine you drink, or stop drinking caffeine.  Eat and sleep on a regular schedule.  Get 7 to 9 hours of sleep, or as recommended by your caregiver.  Keep lights dim if bright lights bother you and make your headaches worse. SEEK MEDICAL CARE IF:   You have problems with the medicines you were prescribed.  Your medicines are not working.  You have a change from the usual headache.  You have nausea or vomiting. SEEK IMMEDIATE MEDICAL CARE IF:   Your headache becomes severe.  You have a fever.  You have a stiff neck.  You have loss of vision.  You have muscular weakness or loss of muscle control.  You start losing your balance or have trouble walking.  You feel faint or pass out.  You have severe symptoms that are different from your first symptoms. MAKE SURE YOU:   Understand these  instructions.  Will watch your condition.  Will get help right away if you are not doing well or get worse. Document Released: 05/26/2005 Document Revised: 08/18/2011 Document Reviewed: 06/11/2011 [facility] Patient Information 2015 [place], [place]. This information is not intended to replace advice given to you by your health care provider. Make sure you discuss any questions you have with your health care provider.   Shortness of Breath Shortness of breath means you have trouble breathing. It could also mean that you have a medical problem. You should get immediate medical care for shortness of breath. CAUSES   Not enough oxygen in the air such as with high altitudes or a smoke-filled room.  Certain lung diseases, infections, or problems.  Heart disease or conditions, such as angina or heart failure.  Low red blood cells (anemia).  Poor physical fitness, which can cause shortness of breath when you exercise.  Chest or back injuries or stiffness.  Being overweight.  Smoking.  Anxiety, which can make you feel like you are not getting enough air. DIAGNOSIS  Serious medical problems can often be found during your physical exam. Tests may also be done to determine why you are having shortness of breath. Tests may include:  Chest X-rays.  Lung function tests.  Blood tests.  An electrocardiogram (ECG).  An ambulatory electrocardiogram. An ambulatory ECG records your heartbeat patterns over a 24-hour period.  Exercise testing.  A transthoracic echocardiogram (TTE).  echocardiography, sound waves are used to evaluate how blood flows through your heart. °· A transesophageal echocardiogram (TEE). °· Imaging scans. °Your health care provider may not be able to find a cause for your shortness of breath after your exam. In this case, it is important to have a follow-up exam with your health care provider as directed.  °TREATMENT  °Treatment for shortness of breath depends on the  cause of your symptoms and can vary greatly. °HOME CARE INSTRUCTIONS  °· Do not smoke. Smoking is a common cause of shortness of breath. If you smoke, ask for help to quit. °· Avoid being around chemicals or things that may bother your breathing, such as paint fumes and dust. °· Rest as needed. Slowly resume your usual activities. °· If medicines were prescribed, take them as directed for the full length of time directed. This includes oxygen and any inhaled medicines. °· Keep all follow-up appointments as directed by your health care provider. °SEEK MEDICAL CARE IF:  °· Your condition does not improve in the time expected. °· You have a hard time doing your normal activities even with rest. °· You have any new symptoms. °SEEK IMMEDIATE MEDICAL CARE IF:  °· Your shortness of breath gets worse. °· You feel light-headed, faint, or develop a cough not controlled with medicines. °· You start coughing up blood. °· You have pain with breathing. °· You have chest pain or pain in your arms, shoulders, or abdomen. °· You have a fever. °· You are unable to walk up stairs or exercise the way you normally do. °MAKE SURE YOU: °· Understand these instructions. °· Will watch your condition. °· Will get help right away if you are not doing well or get worse. °Document Released: 02/18/2001 Document Revised: 05/31/2013 Document Reviewed: 08/11/2011 °ExitCare® Patient Information ©2015 [employer], LLC. This information is not intended to replace advice given to you by your health care provider. Make sure you discuss any questions you have with your health care provider. ° °

## 2014-04-21 NOTE — ED Notes (Signed)
Patient transported to CT/Xray. 

## 2014-04-21 NOTE — ED Notes (Signed)
Pt with decreased sats of 88% even while speaking with RN.  Placed on 4L [place] - blow by (pt mouth breathing).

## 2014-04-26 ENCOUNTER — Emergency Department (HOSPITAL_COMMUNITY)
Admission: EM | Admit: 2014-04-26 | Discharge: 2014-04-26 | Disposition: A | Discharge status: Home / Self Care | Payer: BC Managed Care – PPO | Source: Home / Self Care | Attending: Family Medicine | Admitting: Family Medicine

## 2014-04-26 ENCOUNTER — Encounter (HOSPITAL_COMMUNITY): Payer: Self-pay | Admitting: Emergency Medicine

## 2014-04-26 ENCOUNTER — Other Ambulatory Visit: Payer: Self-pay | Admitting: Internal Medicine

## 2014-04-26 DIAGNOSIS — L0291 Cutaneous abscess, unspecified: Secondary | ICD-10-CM

## 2014-04-26 MED ORDER — LIDOCAINE-EPINEPHRINE (PF) 2 %-1:200000 IJ SOLN
INTRAMUSCULAR | Status: AC
  Filled 2014-04-26: qty 20

## 2014-04-26 NOTE — Discharge Instructions (Signed)
Thank you for coming in today. Follow up with your doctor.  Usually you do not need antibiotics for these.  If you worsen your doctor can start some.  However antibiotics will mess up your warfarin level.  Come back as needed.    Abscess Care After An abscess (also called a boil or furuncle) is an infected area that contains a collection of pus. Signs and symptoms of an abscess include pain, tenderness, redness, or hardness, or you may feel a moveable soft area under your skin. An abscess can occur anywhere in the body. The infection may spread to surrounding tissues causing cellulitis. A cut (incision) by the surgeon was made over your abscess and the pus was drained out. Gauze may have been packed into the space to provide a drain that will allow the cavity to heal from the inside outwards. The boil may be painful for 5 to 7 days. Most people with a boil do not have high fevers. Your abscess, if seen early, may not have localized, and may not have been lanced. If not, another appointment may be required for this if it does not get better on its own or with medications. HOME CARE INSTRUCTIONS   Only take over-the-counter or prescription medicines for pain, discomfort, or fever as directed by your caregiver.  When you bathe, soak and then remove gauze or iodoform packs at least daily or as directed by your caregiver. You may then wash the wound gently with mild soapy water. Repack with gauze or do as your caregiver directs. SEEK IMMEDIATE MEDICAL CARE IF:   You develop increased pain, swelling, redness, drainage, or bleeding in the wound site.  You develop signs of generalized infection including muscle aches, chills, fever, or a general ill feeling.  An oral temperature above 102 F (38.9 C) develops, not controlled by medication. See your caregiver for a recheck if you develop any of the symptoms described above. If medications (antibiotics) were prescribed, take them as directed. Document  Released: 12/12/2004 Document Revised: 08/18/2011 Document Reviewed: 08/09/2007 [facility] Patient Information 2015 [place]Care, [place]LLC. This information is not intended to replace advice given to you by your health care provider. Make sure you discuss any questions you have with your health care provider.

## 2014-04-26 NOTE — ED Provider Notes (Signed)
Lance BongoFrederick G [name] is a 53 y.o. male who presents to Urgent Care today for Abscess. Patient developed an abscess in his right axilla present for the last 8 days worsening recently. No fevers or chills vomiting or diarrhea. No medications tried yet. Patient has used warm compress which did not help.   Past Medical History  Diagnosis Date  . S/P aortic valve replacement     HX of St. Jude  . Chest pain, atypical   . Hypertension     Unspecified  . Hyperlipidemia     Mixed  . Obesity   . Sleep apnea, obstructive    Past Surgical History  Procedure Laterality Date  . Emergency median sternotomy  12/06/2004  . Extracorporeal circulation  12/06/2004  . Replacement of aortic valve  12/06/2004  . Ascending aortic dissection aneurysm  12/06/2004    Using a 27-mm St. Jude mechanical valve conduit with reimplantation of both coronary arteries  . Intraoperative transesophageal echocardiogram  12/06/2004  . Knee surgery      R  . Corneal transplant      R    History  Substance Use Topics  . Smoking status: Former Smoker -- 0.10 packs/day for 18 years    Types: Cigarettes    Quit date: 12/07/1989  . Smokeless tobacco: Former NeurosurgeonUser  . Alcohol Use: No     Comment: Quit 1991   ROS as above Medications: No current facility-administered medications for this encounter.   Current Outpatient Prescriptions  Medication Sig Dispense Refill  . aspirin 81 MG EC tablet Take 81 mg by mouth daily.      . cholecalciferol (VITAMIN D) 1000 UNITS tablet Take 2,000 Units by mouth daily.    Marland Kitchen. gabapentin (NEURONTIN) 100 MG capsule Take 300 mg by mouth at bedtime.     Marland Kitchen. HYDROcodone-acetaminophen (NORCO/VICODIN) 5-325 MG per tablet Take 2 tablets by mouth every 6 (six) hours as needed for severe pain. 10 tablet 0  . meloxicam (MOBIC) 15 MG tablet Take 15 mg by mouth daily.     . methocarbamol (ROBAXIN) 500 MG tablet Take 500 mg by mouth 3 (three) times daily.     . metoprolol succinate (TOPROL-XL) 50 MG 24 hr  tablet Take 50 mg by mouth every evening.    . Multiple Vitamin (MULTIVITAMIN WITH MINERALS) TABS tablet Take 1 tablet by mouth daily.    . ondansetron (ZOFRAN) 4 MG tablet Take 1 tablet (4 mg total) by mouth every 6 (six) hours. 12 tablet 0  . rosuvastatin (CRESTOR) 10 MG tablet Take 10 mg by mouth every Monday, Wednesday, and Friday.     . STELARA 90 MG/ML SOSY Inject 90 mg into the skin every 3 (three) months.   3  . traMADol (ULTRAM) 50 MG tablet Take 50 mg by mouth every 6 (six) hours as needed for moderate pain.     . vitamin C (ASCORBIC ACID) 500 MG tablet Take 500 mg by mouth daily.    Marland Kitchen. warfarin (COUMADIN) 7.5 MG tablet Take as directed by anticoagulation clinic (Patient taking differently: Take 7.5 mg by mouth daily at 6 PM. 7.5 mg Mo,Tu,We.Th,Sa,Su 3.75 on Fridays.) 35 tablet 3   No Known Allergies   Exam:  BP 133/68 mmHg  Pulse 82  Temp(Src) 97.2 F (36.2 C) (Oral)  Resp 16  SpO2 95% Gen: Well NAD Skin: indurated erythematous surrounding central fluctuance abscess right axilla. Tender to touch.  Abscess incision and drainage. Consent obtained and timeout performed.  Patient's INR was  recently 2.4 a few days ago.  Skin cleaned with alcohol and 3 mL of lidocaine with epinephrine injected achieving good anesthesia. Skin cleaned with Betadine. Sharp incision made to fluctuance. Large amount of pus expressed and cultured. Blunt dissection used to break up loculations. Incision widened and further pus expressed. Dressing applied. Patient tolerated the procedure well  No results found for this or any previous visit (from the past 24 hour(s)). No results found.  Assessment and Plan: 53 y.o. male with right axillary abscess. Culture pending. Follow-up as needed. Discussed antibiotics. Will not prescribe at this time for fear of interfering with INR.  Discussed warning signs or symptoms. Please see discharge instructions. Patient expresses understanding.     Rodolph BongEvan S Bertran Zeimet,  MD 04/26/14 [phone]

## 2014-04-26 NOTE — ED Notes (Signed)
C/o abscess on right axilla onset 8 days Reports some drainage Also reports fevers, chills... Not sure if related or due to cold sx [facility], no signs of acute distress.

## 2014-04-28 ENCOUNTER — Ambulatory Visit (INDEPENDENT_AMBULATORY_CARE_PROVIDER_SITE_OTHER): Payer: BC Managed Care – PPO | Admitting: Pharmacist

## 2014-04-28 DIAGNOSIS — Z954 Presence of other heart-valve replacement: Secondary | ICD-10-CM

## 2014-04-28 DIAGNOSIS — Z952 Presence of prosthetic heart valve: Secondary | ICD-10-CM

## 2014-04-28 DIAGNOSIS — I359 Nonrheumatic aortic valve disorder, unspecified: Secondary | ICD-10-CM

## 2014-04-28 DIAGNOSIS — Z7901 Long term (current) use of anticoagulants: Secondary | ICD-10-CM

## 2014-04-28 DIAGNOSIS — Z5181 Encounter for therapeutic drug level monitoring: Secondary | ICD-10-CM

## 2014-04-28 LAB — POCT INR: INR: 2.3

## 2014-04-29 LAB — CULTURE, ROUTINE-ABSCESS: Special Requests: NORMAL

## 2014-04-30 ENCOUNTER — Telehealth (HOSPITAL_COMMUNITY): Payer: Self-pay | Admitting: *Deleted

## 2014-04-30 NOTE — ED Notes (Addendum)
Abscess culture: Mod. MRSA. Lab shown to Dr. Denyse Amassorey. He said no antibiotics because they can effect the INR.  He said to call for clinical improvement and tell pt. to f/u with PCP for eradication of MRSA. I called and left a message to call.  Call 1. Vassie MoselleYork, Ren Grasse M 04/30/2014 I called pt.  Pt. verified x 2 and given result.  Pt. said his wife and daughter have it and declines to listen to the MRSA instructions. He said he went to the coumadin clinic on Friday and they said the antibiotic would not effect his INR and put him on something but can't remember the name. I asked him to call back with the name when he finds out.  He said he would call back tomorrow. 05/01/2014

## 2014-05-14 ENCOUNTER — Encounter (HOSPITAL_COMMUNITY): Payer: Self-pay | Admitting: Emergency Medicine

## 2014-05-14 ENCOUNTER — Emergency Department (HOSPITAL_COMMUNITY)
Admission: EM | Admit: 2014-05-14 | Discharge: 2014-05-14 | Disposition: A | Discharge status: Home / Self Care | Payer: BC Managed Care – PPO | Attending: Emergency Medicine | Admitting: Emergency Medicine

## 2014-05-14 DIAGNOSIS — Y929 Unspecified place or not applicable: Secondary | ICD-10-CM | POA: Insufficient documentation

## 2014-05-14 DIAGNOSIS — Z8669 Personal history of other diseases of the nervous system and sense organs: Secondary | ICD-10-CM | POA: Insufficient documentation

## 2014-05-14 DIAGNOSIS — Y999 Unspecified external cause status: Secondary | ICD-10-CM | POA: Diagnosis not present

## 2014-05-14 DIAGNOSIS — M545 Low back pain: Secondary | ICD-10-CM | POA: Diagnosis not present

## 2014-05-14 DIAGNOSIS — E669 Obesity, unspecified: Secondary | ICD-10-CM | POA: Insufficient documentation

## 2014-05-14 DIAGNOSIS — T45511A Poisoning by anticoagulants, accidental (unintentional), initial encounter: Secondary | ICD-10-CM | POA: Insufficient documentation

## 2014-05-14 DIAGNOSIS — Y939 Activity, unspecified: Secondary | ICD-10-CM | POA: Diagnosis not present

## 2014-05-14 DIAGNOSIS — Z791 Long term (current) use of non-steroidal anti-inflammatories (NSAID): Secondary | ICD-10-CM | POA: Diagnosis not present

## 2014-05-14 DIAGNOSIS — R319 Hematuria, unspecified: Secondary | ICD-10-CM | POA: Diagnosis present

## 2014-05-14 DIAGNOSIS — F17200 Nicotine dependence, unspecified, uncomplicated: Secondary | ICD-10-CM | POA: Insufficient documentation

## 2014-05-14 DIAGNOSIS — Z79899 Other long term (current) drug therapy: Secondary | ICD-10-CM | POA: Insufficient documentation

## 2014-05-14 DIAGNOSIS — Z7982 Long term (current) use of aspirin: Secondary | ICD-10-CM | POA: Diagnosis not present

## 2014-05-14 DIAGNOSIS — I10 Essential (primary) hypertension: Secondary | ICD-10-CM | POA: Diagnosis not present

## 2014-05-14 LAB — BASIC METABOLIC PANEL
ANION GAP: 9 (ref 5–15)
BUN: 14 mg/dL (ref 6–23)
CO2: 28 meq/L (ref 19–32)
Calcium: 9.2 mg/dL (ref 8.4–10.5)
Chloride: 107 mEq/L (ref 96–112)
Creatinine, Ser: 0.8 mg/dL (ref 0.50–1.35)
GFR calc Af Amer: >90 mL/min (ref >90)
Glucose, Bld: 83 mg/dL (ref 70–99)
POTASSIUM: 4.3 meq/L (ref 3.7–5.3)
SODIUM: 144 meq/L (ref 137–147)

## 2014-05-14 LAB — CBC WITH DIFFERENTIAL/PLATELET
Basophils Absolute: 0 10*3/uL (ref 0.0–0.1)
Basophils Relative: 0 % (ref 0–1)
Eosinophils Absolute: 0.2 10*3/uL (ref 0.0–0.7)
Eosinophils Relative: 3 % (ref 0–5)
HEMATOCRIT: 38.5 % — AB (ref 39.0–52.0)
Hemoglobin: 12 g/dL — ABNORMAL LOW (ref 13.0–17.0)
LYMPHS PCT: 22 % (ref 12–46)
Lymphs Abs: 1.4 10*3/uL (ref 0.7–4.0)
MCH: 28.8 pg (ref 26.0–34.0)
MCHC: 31.2 g/dL (ref 30.0–36.0)
MCV: 92.5 fL (ref 78.0–100.0)
Monocytes Absolute: 0.8 10*3/uL (ref 0.1–1.0)
Monocytes Relative: 13 % — ABNORMAL HIGH (ref 3–12)
NEUTROS ABS: 3.8 10*3/uL (ref 1.7–7.7)
Neutrophils Relative %: 62 % (ref 43–77)
PLATELETS: 215 10*3/uL (ref 150–400)
RBC: 4.16 MIL/uL — ABNORMAL LOW (ref 4.22–5.81)
RDW: 14.9 % (ref 11.5–15.5)
WBC: 6.2 10*3/uL (ref 4.0–10.5)

## 2014-05-14 LAB — URINALYSIS, ROUTINE W REFLEX MICROSCOPIC
BILIRUBIN URINE: NEGATIVE
Glucose, UA: NEGATIVE mg/dL
Ketones, ur: NEGATIVE mg/dL
Leukocytes, UA: NEGATIVE
Nitrite: NEGATIVE
PH: 5 (ref 5.0–8.0)
Protein, ur: NEGATIVE mg/dL
SPECIFIC GRAVITY, URINE: 1.016 (ref 1.005–1.030)
Urobilinogen, UA: 0.2 mg/dL (ref 0.0–1.0)

## 2014-05-14 LAB — URINE MICROSCOPIC-ADD ON

## 2014-05-14 LAB — PROTIME-INR
INR: 3.39 — ABNORMAL HIGH (ref 0.00–1.49)
PROTHROMBIN TIME: 34.5 s — AB (ref 11.6–15.2)

## 2014-05-14 MED ORDER — OXYCODONE-ACETAMINOPHEN 5-325 MG PO TABS: 1.0000 | ORAL_TABLET | ORAL | Status: DC | PRN

## 2014-05-14 MED ORDER — OXYCODONE-ACETAMINOPHEN 5-325 MG PO TABS
1.0000 | ORAL_TABLET | Freq: Once | ORAL | Status: AC
  Administered 2014-05-14: 1 via ORAL
  Filled 2014-05-14: qty 1

## 2014-05-14 NOTE — Discharge Instructions (Signed)
Do not take your Coumadin, today. Call your Coumadin clinic in the morning to ask, when you should restart your Coumadin.    Hematuria Hematuria is blood in your urine. It can be caused by a bladder infection, kidney infection, prostate infection, kidney stone, or cancer of your urinary tract. Infections can usually be treated with medicine, and a kidney stone usually will pass through your urine. If neither of these is the cause of your hematuria, further workup to find out the reason may be needed. It is very important that you tell your health care provider about any blood you see in your urine, even if the blood stops without treatment or happens without causing pain. Blood in your urine that happens and then stops and then happens again can be a symptom of a very serious condition. Also, pain is not a symptom in the initial stages of many urinary cancers. HOME CARE INSTRUCTIONS   Drink lots of fluid, 3-4 quarts a day. If you have been diagnosed with an infection, cranberry juice is especially recommended, in addition to large amounts of water.  Avoid caffeine, tea, and carbonated beverages because they tend to irritate the bladder.  Avoid alcohol because it may irritate the prostate.  Take all medicines as directed by your health care provider.  If you were prescribed an antibiotic medicine, finish it all even if you start to feel better.  If you have been diagnosed with a kidney stone, follow your health care provider's instructions regarding straining your urine to catch the stone.  Empty your bladder often. Avoid holding urine for long periods of time.  After a bowel movement, women should cleanse front to back. Use each tissue only once.  Empty your bladder before and after sexual intercourse if you are a male. SEEK MEDICAL CARE IF:  You develop back pain.  You have a fever.  You have a feeling of sickness in your stomach (nausea) or vomiting.  Your symptoms are not  better in 3 days. Return sooner if you are getting worse. SEEK IMMEDIATE MEDICAL CARE IF:   You develop severe vomiting and are unable to keep the medicine down.  You develop severe back or abdominal pain despite taking your medicines.  You begin passing a large amount of blood or clots in your urine.  You feel extremely weak or faint, or you pass out. MAKE SURE YOU:   Understand these instructions.  Will watch your condition.  Will get help right away if you are not doing well or get worse. Document Released: 05/26/2005 Document Revised: 10/10/2013 Document Reviewed: 01/24/2013 [facility] Patient Information 2015 [place], [place]. This information is not intended to replace advice given to you by your health care provider. Make sure you discuss any questions you have with your health care provider.  Back Pain, Adult Back pain is very common. The pain often gets better over time. The cause of back pain is usually not dangerous. Most people can learn to manage their back pain on their own.  HOME CARE   Stay active. Start with short walks on flat ground if you can. Try to walk farther each day.  Do not sit, drive, or stand in one place for more than 30 minutes. Do not stay in bed.  Do not avoid exercise or work. Activity can help your back heal faster.  Be careful when you bend or lift an object. Bend at your knees, keep the object close to you, and do not twist.  Sleep on  a firm mattress. Lie on your side, and bend your knees. If you lie on your back, put a pillow under your knees.  Only take medicines as told by your doctor.  Put ice on the injured area.  Put ice in a plastic bag.  Place a towel between your skin and the bag.  Leave the ice on for 15-20 minutes, 03-04 times a day for the first 2 to 3 days. After that, you can switch between ice and heat packs.  Ask your doctor about back exercises or massage.  Avoid feeling anxious or stressed. Find good ways to deal with  stress, such as exercise. GET HELP RIGHT AWAY IF:   Your pain does not go away with rest or medicine.  Your pain does not go away in 1 week.  You have new problems.  You do not feel well.  The pain spreads into your legs.  You cannot control when you poop (bowel movement) or pee (urinate).  Your arms or legs feel weak or lose feeling (numbness).  You feel sick to your stomach (nauseous) or throw up (vomit).  You have belly (abdominal) pain.  You feel like you may pass out (faint). MAKE SURE YOU:   Understand these instructions.  Will watch your condition.  Will get help right away if you are not doing well or get worse. Document Released: 11/12/2007 Document Revised: 08/18/2011 Document Reviewed: 09/27/2013 [facility] Patient Information 2015 [place], [place]. This information is not intended to replace advice given to you by your health care provider. Make sure you discuss any questions you have with your health care provider.

## 2014-05-14 NOTE — ED Provider Notes (Signed)
CSN: [id number]     Arrival date & time 05/14/14  0901 History   First MD Initiated Contact with Patient 05/14/14 [phone]     Chief Complaint  Patient presents with  . Hematuria     (Consider location/radiation/quality/duration/timing/severity/associated sxs/prior Treatment) HPI   [name] is a 53 y.o. male who is here for evaluation of hematuria.  The hematuria present for 2 days, it comes and goes.  He denies dysuria, frequency, fever, chills, nausea or vomiting.  He has intermittent recurrent bleeding in the mouth, usually in the morning.  He states in the last 2 days it has been "all day every day."  He denies weakness, dizziness, or back pain.  He has persistent ongoing daily "migraines".  He has been evaluated by a neurology service, and prescribed Robaxin to take 3 times a day to help treat his headaches.  He reports mild low back pain, typical of his usual back pain, unresponsive to Tylenol, which he took prior to coming here.  He is taking his usual medications, without relief.  There are no other known modifying factors.   Past Medical History  Diagnosis Date  . S/P aortic valve replacement     HX of St. Jude  . Chest pain, atypical   . Hypertension     Unspecified  . Hyperlipidemia     Mixed  . Obesity   . Sleep apnea, obstructive    Past Surgical History  Procedure Laterality Date  . Emergency median sternotomy  12/06/2004  . Extracorporeal circulation  12/06/2004  . Replacement of aortic valve  12/06/2004  . Ascending aortic dissection aneurysm  12/06/2004    Using a 27-mm St. Jude mechanical valve conduit with reimplantation of both coronary arteries  . Intraoperative transesophageal echocardiogram  12/06/2004  . Knee surgery      R  . Corneal transplant      R    Family History  Problem Relation Age of Onset  . Cervical cancer Mother   . Coronary artery disease Mother   . Diabetes Mother   . Diabetes Father   . Cervical cancer Sister    History   Substance Use Topics  . Smoking status: Former Smoker -- 0.10 packs/day for 18 years    Types: Cigarettes    Quit date: 12/07/1989  . Smokeless tobacco: Former NeurosurgeonUser  . Alcohol Use: No     Comment: Quit 1991    Review of Systems  All other systems reviewed and are negative.     Allergies  Review of patient's allergies indicates no known allergies.  Home Medications   Prior to Admission medications   Medication Sig Start Date End Date Taking? Authorizing Provider  Apremilast (OTEZLA) 10 & 20 & 30 MG TBPK Take 10 mg by mouth daily.   Yes Historical Provider, MD  aspirin 81 MG EC tablet Take 81 mg by mouth daily.     Yes Historical Provider, MD  cholecalciferol (VITAMIN D) 1000 UNITS tablet Take 2,000 Units by mouth daily.   Yes Historical Provider, MD  gabapentin (NEURONTIN) 100 MG capsule Take 500 mg by mouth at bedtime.  10/01/12  Yes Historical Provider, MD  meloxicam (MOBIC) 15 MG tablet Take 15 mg by mouth daily.  12/08/13  Yes Historical Provider, MD  methocarbamol (ROBAXIN) 500 MG tablet Take 500 mg by mouth 3 (three) times daily.  11/09/13  Yes Historical Provider, MD  metoprolol succinate (TOPROL-XL) 50 MG 24 hr tablet Take 50 mg by mouth every  evening. 08/29/13  Yes Historical Provider, MD  Multiple Vitamin (MULTIVITAMIN WITH MINERALS) TABS tablet Take 1 tablet by mouth daily.   Yes Historical Provider, MD  rosuvastatin (CRESTOR) 10 MG tablet Take 10 mg by mouth every Monday, Wednesday, and Friday.    Yes Historical Provider, MD  vitamin C (ASCORBIC ACID) 500 MG tablet Take 500 mg by mouth daily.   Yes Historical Provider, MD  warfarin (COUMADIN) 7.5 MG tablet Take as directed by anticoagulation clinic Patient taking differently: Take 7.5 mg by mouth daily at 6 PM. 7.5 mg Mo,Tu,We.Th,Sa,Su 3.75 on Fridays. 03/20/14  Yes Tonny BollmanMichael Cooper, MD  HYDROcodone-acetaminophen (NORCO/VICODIN) 5-325 MG per tablet Take 2 tablets by mouth every 6 (six) hours as needed for severe pain. Patient  not taking: Reported on 05/14/2014 04/21/14   Maxine GlennAnn Batista, MD  ondansetron (ZOFRAN) 4 MG tablet Take 1 tablet (4 mg total) by mouth every 6 (six) hours. 04/16/14   Harle BattiestElizabeth Tysinger, NP  STELARA 90 MG/ML SOSY Inject 90 mg into the skin every 3 (three) months.  04/13/14   Historical Provider, MD  traMADol (ULTRAM) 50 MG tablet Take 50 mg by mouth every 6 (six) hours as needed for moderate pain.     Historical Provider, MD   BP 117/53 mmHg  Pulse 91  Temp(Src) 98.1 F (36.7 C) (Oral)  Resp 13  Ht 6\' 2"  (1.88 m)  Wt 330 lb (149.687 kg)  BMI 42.35 kg/m2  SpO2 98% Physical Exam  Constitutional: He is oriented to person, place, and time. He appears well-developed and well-nourished.  HENT:  Head: Normocephalic and atraumatic.  Right Ear: External ear normal.  Left Ear: External ear normal.  There is no blood in the oropharynx.  There is no evidence for dental injury, trauma or abscess.  Eyes: Conjunctivae and EOM are normal. Pupils are equal, round, and reactive to light.  Neck: Normal range of motion and phonation normal. Neck supple.  Cardiovascular: Normal rate, regular rhythm and normal heart sounds.   Pulmonary/Chest: Effort normal and breath sounds normal. He exhibits no bony tenderness.  Abdominal: Soft. There is no tenderness.  Musculoskeletal: Normal range of motion.  Neurological: He is alert and oriented to person, place, and time. No cranial nerve deficit or sensory deficit. He exhibits normal muscle tone. Coordination normal.  Skin: Skin is warm, dry and intact.  Psychiatric: He has a normal mood and affect. His behavior is normal. Judgment and thought content normal.  Nursing note and vitals reviewed.   ED Course  Procedures (including critical care time)  Medications - No data to display  Patient Vitals for the past 24 hrs:  BP Temp Temp src Pulse Resp SpO2 Height Weight  05/14/14 1000 (!) 117/53 mmHg - - 91 13 98 % - -  05/14/14 0936 132/59 mmHg 98.1 F (36.7 C) Oral  (!) 58 - 98 % 6\' 2"  (1.88 m) (!) 330 lb (149.687 kg)  05/14/14 0935 - 98.1 F (36.7 C) Oral - - - - -  05/14/14 [id number] - - - - 17 - - -  05/14/14 0932 132/59 mmHg - - (!) 34 - 97 % - -    1:10 PM Reevaluation with update and discussion. After initial assessment and treatment, an updated evaluation reveals he states that now his left low back pain, is 10 over 10.  He has spontaneously void.  Here, the urine is yellow in color.  Findings discussed with patient, all questions answered. Halah Whiteside L   Labs Review Labs Reviewed  URINALYSIS, ROUTINE W REFLEX MICROSCOPIC - Abnormal; Notable for the following:    Hgb urine dipstick MODERATE (*)    All other components within normal limits  URINE CULTURE  URINE MICROSCOPIC-ADD ON  BASIC METABOLIC PANEL  CBC WITH DIFFERENTIAL  PROTIME-INR    Imaging Review No results found.   EKG Interpretation None      MDM   Final diagnoses:  Hematuria  Coumadin toxicity, accidental or unintentional, initial encounter  Low back pain without sciatica, unspecified back pain laterality    Hematuria, and oral bleeding, related to prolonged supratherapeutic INR.  No anemia.  Doubt urolithiasis.  He has not had recent changes in dosing, or taken known medications that would alter the INR.  Nursing Notes Reviewed/ Care Coordinated Applicable Imaging Reviewed Interpretation of Laboratory Data incorporated into ED treatment  The patient appears reasonably screened and/or stabilized for discharge and I doubt any other medical condition or other [facility] requiring further screening, evaluation, or treatment in the ED at this time prior to discharge.  Plan: Home Medications- hold Coumadin for 24 hours; Home Treatments- rest; return here if the recommended treatment, does not improve the symptoms; Recommended follow up- patient is to contact his Coumadin clinic, in the morning, to see about restarting his Coumadin tomorrow or with holding for 1 more day. PCP  follow-up 3 days for INR check, and reevaluation of back pain.     Flint Melter, MD 05/14/14 1322

## 2014-05-14 NOTE — ED Notes (Signed)
Pt reports finding dark red blood in his urine Thursday and with every urination since.  Pt reports bilat flank pain, used Tylenol for pain.  Denies burning, urgency, pain.  Pt reports feeling like he wasn't finished.  Pt reports beginning Thursday red sputum, "solid dark red."  Pt takes Coumadin.  Pt denies blood in stool, denies unusual smell to stool.  NAD distress noted at this time.

## 2014-05-15 LAB — URINE CULTURE: Colony Count: 50000

## 2014-05-18 ENCOUNTER — Other Ambulatory Visit: Payer: Self-pay | Admitting: Internal Medicine

## 2014-05-18 DIAGNOSIS — R319 Hematuria, unspecified: Secondary | ICD-10-CM

## 2014-05-22 ENCOUNTER — Telehealth: Payer: Self-pay | Admitting: *Deleted

## 2014-05-23 ENCOUNTER — Telehealth: Payer: Self-pay | Admitting: Cardiovascular Disease

## 2014-05-23 NOTE — Telephone Encounter (Signed)
New msg    Pt wants to stop coumadin for 3 days per neurologist. Please contact pt to let know if ok.    [phone]

## 2014-05-23 NOTE — Telephone Encounter (Signed)
Pt has been having blood in his urine and his urologist advised him to hold Coumadin x 3 days.

## 2014-05-24 ENCOUNTER — Other Ambulatory Visit: Payer: BC Managed Care – PPO

## 2014-05-24 DIAGNOSIS — E782 Mixed hyperlipidemia: Secondary | ICD-10-CM | POA: Diagnosis not present

## 2014-05-24 DIAGNOSIS — Z8669 Personal history of other diseases of the nervous system and sense organs: Secondary | ICD-10-CM | POA: Insufficient documentation

## 2014-05-24 DIAGNOSIS — R319 Hematuria, unspecified: Secondary | ICD-10-CM | POA: Insufficient documentation

## 2014-05-24 DIAGNOSIS — Z954 Presence of other heart-valve replacement: Secondary | ICD-10-CM | POA: Insufficient documentation

## 2014-05-24 DIAGNOSIS — I10 Essential (primary) hypertension: Secondary | ICD-10-CM | POA: Diagnosis not present

## 2014-05-24 DIAGNOSIS — Z7901 Long term (current) use of anticoagulants: Secondary | ICD-10-CM | POA: Diagnosis not present

## 2014-05-24 DIAGNOSIS — R0781 Pleurodynia: Secondary | ICD-10-CM | POA: Insufficient documentation

## 2014-05-24 DIAGNOSIS — Z7982 Long term (current) use of aspirin: Secondary | ICD-10-CM | POA: Diagnosis not present

## 2014-05-24 DIAGNOSIS — Z79899 Other long term (current) drug therapy: Secondary | ICD-10-CM | POA: Diagnosis not present

## 2014-05-24 DIAGNOSIS — Z87891 Personal history of nicotine dependence: Secondary | ICD-10-CM | POA: Diagnosis not present

## 2014-05-24 DIAGNOSIS — Z791 Long term (current) use of non-steroidal anti-inflammatories (NSAID): Secondary | ICD-10-CM | POA: Insufficient documentation

## 2014-05-24 DIAGNOSIS — E669 Obesity, unspecified: Secondary | ICD-10-CM | POA: Insufficient documentation

## 2014-05-25 ENCOUNTER — Encounter (HOSPITAL_COMMUNITY): Payer: Self-pay | Admitting: Emergency Medicine

## 2014-05-25 ENCOUNTER — Emergency Department (HOSPITAL_COMMUNITY)
Admission: EM | Admit: 2014-05-25 | Discharge: 2014-05-25 | Disposition: A | Discharge status: Home / Self Care | Payer: BC Managed Care – PPO | Attending: Emergency Medicine | Admitting: Emergency Medicine

## 2014-05-25 DIAGNOSIS — R319 Hematuria, unspecified: Secondary | ICD-10-CM

## 2014-05-25 LAB — COMPREHENSIVE METABOLIC PANEL
ALT: 33 U/L (ref 0–53)
AST: 16 U/L (ref 0–37)
Albumin: 3.6 g/dL (ref 3.5–5.2)
Alkaline Phosphatase: 88 U/L (ref 39–117)
Anion gap: 11 (ref 5–15)
BILIRUBIN TOTAL: 0.3 mg/dL (ref 0.3–1.2)
BUN: 17 mg/dL (ref 6–23)
CO2: 28 meq/L (ref 19–32)
Calcium: 9.4 mg/dL (ref 8.4–10.5)
Chloride: 103 mEq/L (ref 96–112)
Creatinine, Ser: 1.31 mg/dL (ref 0.50–1.35)
GFR calc Af Amer: 69 mL/min — ABNORMAL LOW (ref >90)
GFR, EST NON AFRICAN AMERICAN: 60 mL/min — AB (ref >90)
Glucose, Bld: 106 mg/dL — ABNORMAL HIGH (ref 70–99)
Potassium: 4 mEq/L (ref 3.7–5.3)
SODIUM: 142 meq/L (ref 137–147)
Total Protein: 7.6 g/dL (ref 6.0–8.3)

## 2014-05-25 LAB — CBC
HCT: 37.5 % — ABNORMAL LOW (ref 39.0–52.0)
Hemoglobin: 11.6 g/dL — ABNORMAL LOW (ref 13.0–17.0)
MCH: 28.9 pg (ref 26.0–34.0)
MCHC: 30.9 g/dL (ref 30.0–36.0)
MCV: 93.3 fL (ref 78.0–100.0)
PLATELETS: 212 10*3/uL (ref 150–400)
RBC: 4.02 MIL/uL — AB (ref 4.22–5.81)
RDW: 14.9 % (ref 11.5–15.5)
WBC: 6.5 10*3/uL (ref 4.0–10.5)

## 2014-05-25 LAB — PROTIME-INR
INR: 3.02 — AB (ref 0.00–1.49)
Prothrombin Time: 31.5 seconds — ABNORMAL HIGH (ref 11.6–15.2)

## 2014-05-25 MED ORDER — TRAMADOL HCL 50 MG PO TABS: 50.0000 mg | ORAL_TABLET | Freq: Four times a day (QID) | ORAL | Status: DC | PRN

## 2014-05-25 NOTE — ED Provider Notes (Signed)
CSN: [id number]     Arrival date & time 05/24/14  2320 History   First MD Initiated Contact with Patient 05/25/14 0039     Chief Complaint  Patient presents with  . Hematuria  . Rib pain      (Consider location/radiation/quality/duration/timing/severity/associated sxs/prior Treatment) Patient is a 53 y.o. male presenting with hematuria. The history is provided by the patient.  Hematuria Pertinent negatives include no chest pain, no abdominal pain, no headaches and no shortness of breath.  pt c/o hematuria for the past 2 months.  Episodic, persistent. Denies any acute or abrupt change in symptoms tonight. States he feels he is able to empty bladder completely.   Denies faintness or dizziness. States intermittently has right flank pain posteriorly, dull, mild - states saw his urologist for same today and reports ct neg, has plans to follow up there Monday. Denies fever or chills. No other abnormal bruising or bleeding. No melena or rectal bleeding. No cp or sob. Denies abd or flank trauma. States is able to void. No dysuria. No wt loss. Normal appetite. No recent change in meds, is on coumadin for hx St Judes AVR many years ago.     Past Medical History  Diagnosis Date  . S/P aortic valve replacement     HX of St. Jude  . Chest pain, atypical   . Hypertension     Unspecified  . Hyperlipidemia     Mixed  . Obesity   . Sleep apnea, obstructive    Past Surgical History  Procedure Laterality Date  . Emergency median sternotomy  12/06/2004  . Extracorporeal circulation  12/06/2004  . Replacement of aortic valve  12/06/2004  . Ascending aortic dissection aneurysm  12/06/2004    Using a 27-mm St. Jude mechanical valve conduit with reimplantation of both coronary arteries  . Intraoperative transesophageal echocardiogram  12/06/2004  . Knee surgery      R  . Corneal transplant      R    Family History  Problem Relation Age of Onset  . Cervical cancer Mother   . Coronary artery disease  Mother   . Diabetes Mother   . Diabetes Father   . Cervical cancer Sister    History  Substance Use Topics  . Smoking status: Former Smoker -- 0.10 packs/day for 18 years    Types: Cigarettes    Quit date: 12/07/1989  . Smokeless tobacco: Former NeurosurgeonUser  . Alcohol Use: No     Comment: Quit 1991    Review of Systems  Constitutional: Negative for fever and chills.  HENT: Negative for nosebleeds.   Eyes: Negative for redness.  Respiratory: Negative for shortness of breath.   Cardiovascular: Negative for chest pain.  Gastrointestinal: Negative for vomiting, abdominal pain, diarrhea and blood in stool.  Endocrine: Negative for polyuria.  Genitourinary: Positive for hematuria. Negative for dysuria.  Musculoskeletal: Negative for back pain and neck pain.  Skin: Negative for rash.  Neurological: Negative for headaches.  Hematological:       On coumadin.   Psychiatric/Behavioral: Negative for confusion.      Allergies  Review of patient's allergies indicates no known allergies.  Home Medications   Prior to Admission medications   Medication Sig Start Date End Date Taking? Authorizing Provider  Apremilast (OTEZLA) 10 & 20 & 30 MG TBPK Take 10 mg by mouth daily.    Historical Provider, MD  aspirin 81 MG EC tablet Take 81 mg by mouth daily.  Historical Provider, MD  cholecalciferol (VITAMIN D) 1000 UNITS tablet Take 2,000 Units by mouth daily.    Historical Provider, MD  gabapentin (NEURONTIN) 100 MG capsule Take 500 mg by mouth at bedtime.  10/01/12   Historical Provider, MD  HYDROcodone-acetaminophen (NORCO/VICODIN) 5-325 MG per tablet Take 2 tablets by mouth every 6 (six) hours as needed for severe pain. Patient not taking: Reported on 05/14/2014 04/21/14   Maxine GlennAnn Batista, MD  meloxicam (MOBIC) 15 MG tablet Take 15 mg by mouth daily.  12/08/13   Historical Provider, MD  methocarbamol (ROBAXIN) 500 MG tablet Take 500 mg by mouth 3 (three) times daily.  11/09/13   Historical Provider, MD   metoprolol succinate (TOPROL-XL) 50 MG 24 hr tablet Take 50 mg by mouth every evening. 08/29/13   Historical Provider, MD  Multiple Vitamin (MULTIVITAMIN WITH MINERALS) TABS tablet Take 1 tablet by mouth daily.    Historical Provider, MD  ondansetron (ZOFRAN) 4 MG tablet Take 1 tablet (4 mg total) by mouth every 6 (six) hours. 04/16/14   Harle BattiestElizabeth Tysinger, NP  oxyCODONE-acetaminophen (PERCOCET) 5-325 MG per tablet Take 1 tablet by mouth every 4 (four) hours as needed. 05/14/14   Flint MelterElliott L Wentz, MD  rosuvastatin (CRESTOR) 10 MG tablet Take 10 mg by mouth every Monday, Wednesday, and Friday.     Historical Provider, MD  STELARA 90 MG/ML SOSY Inject 90 mg into the skin every 3 (three) months.  04/13/14   Historical Provider, MD  traMADol (ULTRAM) 50 MG tablet Take 50 mg by mouth every 6 (six) hours as needed for moderate pain.     Historical Provider, MD  vitamin C (ASCORBIC ACID) 500 MG tablet Take 500 mg by mouth daily.    Historical Provider, MD  warfarin (COUMADIN) 7.5 MG tablet Take as directed by anticoagulation clinic Patient taking differently: Take 7.5 mg by mouth daily at 6 PM. 7.5 mg Mo,Tu,We.Th,Sa,Su 3.75 on Fridays. 03/20/14   Micheline ChapmanMichael D Cooper, MD   BP 149/73 mmHg  Pulse 46  Temp(Src) 98.4 F (36.9 C) (Oral)  Resp 16  Ht 6\' 2"  (1.88 m)  Wt 330 lb (149.687 kg)  BMI 42.35 kg/m2  SpO2 96% Physical Exam  Constitutional: He is oriented to person, place, and time. He appears well-developed and well-nourished. No distress.  HENT:  Mouth/Throat: Oropharynx is clear and moist.  Eyes: Conjunctivae are normal. No scleral icterus.  Neck: Neck supple. No tracheal deviation present.  Cardiovascular: Normal rate, regular rhythm, normal heart sounds and intact distal pulses.  Exam reveals no gallop and no friction rub.   Mechanical s2. ?faint sys murmur  Pulmonary/Chest: Effort normal. No accessory muscle usage. No respiratory distress.  Abdominal: Soft. Bowel sounds are normal. He exhibits  no distension and no mass. There is no tenderness. There is no rebound and no guarding.  Obese. No incarc hernia.   Genitourinary:  No cva tenderness. Normal ext gen, no pain or tenderness.   Musculoskeletal: Normal range of motion.  Neurological: He is alert and oriented to person, place, and time.  Skin: Skin is warm and dry. No rash noted. He is not diaphoretic.  No shingles/rash in area right flank pain  Psychiatric: He has a normal mood and affect.  Nursing note and vitals reviewed.   ED Course  Procedures (including critical care time) Labs Review  Results for orders placed or performed during the hospital encounter of 05/25/14  CBC  Result Value Ref Range   WBC 6.5 4.0 - 10.5 K/uL  RBC 4.02 (L) 4.22 - 5.81 MIL/uL   Hemoglobin 11.6 (L) 13.0 - 17.0 g/dL   HCT 16.1 (L) 09.6 - 04.5 %   MCV 93.3 78.0 - 100.0 fL   MCH 28.9 26.0 - 34.0 pg   MCHC 30.9 30.0 - 36.0 g/dL   RDW 40.9 81.1 - 91.4 %   Platelets 212 150 - 400 K/uL  Comprehensive metabolic panel  Result Value Ref Range   Sodium 142 137 - 147 mEq/L   Potassium 4.0 3.7 - 5.3 mEq/L   Chloride 103 96 - 112 mEq/L   CO2 28 19 - 32 mEq/L   Glucose, Bld 106 (H) 70 - 99 mg/dL   BUN 17 6 - 23 mg/dL   Creatinine, Ser 7.82 0.50 - 1.35 mg/dL   Calcium 9.4 8.4 - 95.6 mg/dL   Total Protein 7.6 6.0 - 8.3 g/dL   Albumin 3.6 3.5 - 5.2 g/dL   AST 16 0 - 37 U/L   ALT 33 0 - 53 U/L   Alkaline Phosphatase 88 39 - 117 U/L   Total Bilirubin 0.3 0.3 - 1.2 mg/dL   GFR calc non Af Amer 60 (L) >90 mL/min   GFR calc Af Amer 69 (L) >90 mL/min   Anion gap 11 5 - 15  Protime-INR  Result Value Ref Range   Prothrombin Time 31.5 (H) 11.6 - 15.2 seconds   INR 3.02 (H) 0.00 - 1.49       MDM   Labs.  Pt reports having ct scan at his urologist office today, Dr Brunilda Payor, where he was evaluated several hours ago for the same symptoms - pt indicates he was told CT normal/negative.  No acute or abrupt change in symptoms since then. Pt has voided  in ED - essentially clear urine x very slight bloodish tinge.   Reviewed nursing notes and prior charts for additional history.   Pt requests something for pain for home.  Recheck pt resting comfortably.  Reviewed pacs -  Pt with CT today, per report, lung bases clear, no acute intra abd/pelvic process.    Pt has f/u w urology.   INr 3. Pt also to call his coumadin clinic in AM to discuss dosing, as held med for past day.  Pt appears stable for d/c.     Suzi Roots, MD 05/25/14 [phone]

## 2014-05-25 NOTE — ED Notes (Signed)
Pt presents with c/o blood in urine x 4 days with dysuria, pt also c/o R rib pain with breathing denies injury

## 2014-05-25 NOTE — Discharge Instructions (Signed)
It was our pleasure to provide your ER care today - we hope that you feel better.  Your recent CT was read as showing no acute process.  Today, your INR is 3.  Contact your couamdin clinic this morning, discuss this value and your recent hematuria, and discuss plan for your coumadin dosing.  Follow up with your urologist as planned in the next couple days.  You may take ultram as need for pain - no driving when taking.  Return to ER if worse, new symptoms, fevers, unable to void, severe abdominal or flank pain, chest pain, trouble breathing, other concern.      Hematuria Hematuria is blood in your urine. It can be caused by a bladder infection, kidney infection, prostate infection, kidney stone, or cancer of your urinary tract. Infections can usually be treated with medicine, and a kidney stone usually will pass through your urine. If neither of these is the cause of your hematuria, further workup to find out the reason may be needed. It is very important that you tell your health care provider about any blood you see in your urine, even if the blood stops without treatment or happens without causing pain. Blood in your urine that happens and then stops and then happens again can be a symptom of a very serious condition. Also, pain is not a symptom in the initial stages of many urinary cancers. HOME CARE INSTRUCTIONS   Drink lots of fluid, 3-4 quarts a day. If you have been diagnosed with an infection, cranberry juice is especially recommended, in addition to large amounts of water.  Avoid caffeine, tea, and carbonated beverages because they tend to irritate the bladder.  Avoid alcohol because it may irritate the prostate.  Take all medicines as directed by your health care provider.  If you were prescribed an antibiotic medicine, finish it all even if you start to feel better.  If you have been diagnosed with a kidney stone, follow your health care provider's instructions regarding  straining your urine to catch the stone.  Empty your bladder often. Avoid holding urine for long periods of time.  After a bowel movement, women should cleanse front to back. Use each tissue only once.  Empty your bladder before and after sexual intercourse if you are a male. SEEK MEDICAL CARE IF:  You develop back pain.  You have a fever.  You have a feeling of sickness in your stomach (nausea) or vomiting.  Your symptoms are not better in 3 days. Return sooner if you are getting worse. SEEK IMMEDIATE MEDICAL CARE IF:   You develop severe vomiting and are unable to keep the medicine down.  You develop severe back or abdominal pain despite taking your medicines.  You begin passing a large amount of blood or clots in your urine.  You feel extremely weak or faint, or you pass out. MAKE SURE YOU:   Understand these instructions.  Will watch your condition.  Will get help right away if you are not doing well or get worse. Document Released: 05/26/2005 Document Revised: 10/10/2013 Document Reviewed: 01/24/2013 [facility] Patient Information 2015 [place]Care, [place]. This information is not intended to replace advice given to you by your health care provider. Make sure you discuss any questions you have with your health care provider.    Flank Pain Flank pain refers to pain that is located on the side of the body between the upper abdomen and the back. The pain may occur over a short period of  time (acute) or may be long-term or reoccurring (chronic). It may be mild or severe. Flank pain can be caused by many things. CAUSES  Some of the more common causes of flank pain include:  Muscle strains.   Muscle spasms.   A disease of your spine (vertebral disk disease).   A lung infection (pneumonia).   Fluid around your lungs (pulmonary edema).   A kidney infection.   Kidney stones.   A very painful skin rash caused by the chickenpox virus (shingles).   Gallbladder  disease.  HOME CARE INSTRUCTIONS  Home care will depend on the cause of your pain. In general,  Rest as directed by your caregiver.  Drink enough fluids to keep your urine clear or pale yellow.  Only take over-the-counter or prescription medicines as directed by your caregiver. Some medicines may help relieve the pain.  Tell your caregiver about any changes in your pain.  Follow up with your caregiver as directed. SEEK IMMEDIATE MEDICAL CARE IF:   Your pain is not controlled with medicine.   You have new or worsening symptoms.  Your pain increases.   You have abdominal pain.   You have shortness of breath.   You have persistent nausea or vomiting.   You have swelling in your abdomen.   You feel faint or pass out.   You have blood in your urine.  You have a fever or persistent symptoms for more than 2-3 days.  You have a fever and your symptoms suddenly get worse. MAKE SURE YOU:   Understand these instructions.  Will watch your condition.  Will get help right away if you are not doing well or get worse. Document Released: 07/17/2005 Document Revised: 02/18/2012 Document Reviewed: 01/08/2012 [facility] Patient Information 2015 [place], [place]. This information is not intended to replace advice given to you by your health care provider. Make sure you discuss any questions you have with your health care provider.

## 2014-05-26 ENCOUNTER — Ambulatory Visit (INDEPENDENT_AMBULATORY_CARE_PROVIDER_SITE_OTHER): Payer: BC Managed Care – PPO | Admitting: Cardiology

## 2014-05-26 DIAGNOSIS — Z954 Presence of other heart-valve replacement: Secondary | ICD-10-CM

## 2014-05-26 DIAGNOSIS — I359 Nonrheumatic aortic valve disorder, unspecified: Secondary | ICD-10-CM

## 2014-05-26 DIAGNOSIS — Z5181 Encounter for therapeutic drug level monitoring: Secondary | ICD-10-CM

## 2014-05-26 DIAGNOSIS — Z952 Presence of prosthetic heart valve: Secondary | ICD-10-CM

## 2014-05-26 NOTE — Telephone Encounter (Signed)
Spoke with pt, he went to ER last night for hematuria and waking up with blood in his mouth. INR 3.02.  Was given Tramadol. He has seen Urology and not sure of reason for bleeding. Instructed pt to take 1/2 tablet today and tomorrow. Rescheduled him for 2 week appt.

## 2014-05-30 NOTE — Telephone Encounter (Signed)
Patient aware that he should be seen in the Clinic.

## 2014-06-08 ENCOUNTER — Ambulatory Visit (INDEPENDENT_AMBULATORY_CARE_PROVIDER_SITE_OTHER): Payer: BC Managed Care – PPO | Admitting: Pharmacist Clinician (PhC)/ Clinical Pharmacy Specialist

## 2014-06-08 DIAGNOSIS — Z952 Presence of prosthetic heart valve: Secondary | ICD-10-CM

## 2014-06-08 DIAGNOSIS — Z7901 Long term (current) use of anticoagulants: Secondary | ICD-10-CM

## 2014-06-08 DIAGNOSIS — I359 Nonrheumatic aortic valve disorder, unspecified: Secondary | ICD-10-CM

## 2014-06-08 DIAGNOSIS — Z5181 Encounter for therapeutic drug level monitoring: Secondary | ICD-10-CM

## 2014-06-08 DIAGNOSIS — Z954 Presence of other heart-valve replacement: Secondary | ICD-10-CM

## 2014-06-08 LAB — POCT INR: INR: 2.4

## 2014-07-07 ENCOUNTER — Ambulatory Visit (INDEPENDENT_AMBULATORY_CARE_PROVIDER_SITE_OTHER): Payer: BLUE CROSS/BLUE SHIELD | Admitting: *Deleted

## 2014-07-07 DIAGNOSIS — Z954 Presence of other heart-valve replacement: Secondary | ICD-10-CM

## 2014-07-07 DIAGNOSIS — I359 Nonrheumatic aortic valve disorder, unspecified: Secondary | ICD-10-CM

## 2014-07-07 DIAGNOSIS — Z7901 Long term (current) use of anticoagulants: Secondary | ICD-10-CM

## 2014-07-07 DIAGNOSIS — Z952 Presence of prosthetic heart valve: Secondary | ICD-10-CM

## 2014-07-07 DIAGNOSIS — Z5181 Encounter for therapeutic drug level monitoring: Secondary | ICD-10-CM

## 2014-07-07 LAB — POCT INR: INR: 2.8

## 2014-08-18 ENCOUNTER — Ambulatory Visit (INDEPENDENT_AMBULATORY_CARE_PROVIDER_SITE_OTHER): Payer: BLUE CROSS/BLUE SHIELD | Admitting: *Deleted

## 2014-08-18 DIAGNOSIS — I359 Nonrheumatic aortic valve disorder, unspecified: Secondary | ICD-10-CM

## 2014-08-18 DIAGNOSIS — Z954 Presence of other heart-valve replacement: Secondary | ICD-10-CM

## 2014-08-18 DIAGNOSIS — Z952 Presence of prosthetic heart valve: Secondary | ICD-10-CM

## 2014-08-18 DIAGNOSIS — Z5181 Encounter for therapeutic drug level monitoring: Secondary | ICD-10-CM

## 2014-08-18 DIAGNOSIS — Z7901 Long term (current) use of anticoagulants: Secondary | ICD-10-CM

## 2014-08-18 LAB — POCT INR: INR: 1.6

## 2014-08-27 ENCOUNTER — Other Ambulatory Visit: Payer: Self-pay | Admitting: Cardiovascular Disease

## 2014-09-08 ENCOUNTER — Ambulatory Visit (INDEPENDENT_AMBULATORY_CARE_PROVIDER_SITE_OTHER): Payer: BLUE CROSS/BLUE SHIELD | Admitting: *Deleted

## 2014-09-08 DIAGNOSIS — I359 Nonrheumatic aortic valve disorder, unspecified: Secondary | ICD-10-CM | POA: Diagnosis not present

## 2014-09-08 DIAGNOSIS — Z7901 Long term (current) use of anticoagulants: Secondary | ICD-10-CM

## 2014-09-08 DIAGNOSIS — Z954 Presence of other heart-valve replacement: Secondary | ICD-10-CM

## 2014-09-08 DIAGNOSIS — Z5181 Encounter for therapeutic drug level monitoring: Secondary | ICD-10-CM | POA: Diagnosis not present

## 2014-09-08 DIAGNOSIS — Z952 Presence of prosthetic heart valve: Secondary | ICD-10-CM

## 2014-09-08 LAB — POCT INR: INR: 2

## 2014-10-13 ENCOUNTER — Ambulatory Visit (INDEPENDENT_AMBULATORY_CARE_PROVIDER_SITE_OTHER): Payer: BLUE CROSS/BLUE SHIELD | Admitting: *Deleted

## 2014-10-13 DIAGNOSIS — Z954 Presence of other heart-valve replacement: Secondary | ICD-10-CM

## 2014-10-13 DIAGNOSIS — Z7901 Long term (current) use of anticoagulants: Secondary | ICD-10-CM | POA: Diagnosis not present

## 2014-10-13 DIAGNOSIS — I359 Nonrheumatic aortic valve disorder, unspecified: Secondary | ICD-10-CM | POA: Diagnosis not present

## 2014-10-13 DIAGNOSIS — Z5181 Encounter for therapeutic drug level monitoring: Secondary | ICD-10-CM

## 2014-10-13 DIAGNOSIS — Z952 Presence of prosthetic heart valve: Secondary | ICD-10-CM

## 2014-10-13 LAB — POCT INR: INR: 2.3

## 2014-11-24 ENCOUNTER — Ambulatory Visit (INDEPENDENT_AMBULATORY_CARE_PROVIDER_SITE_OTHER): Payer: BLUE CROSS/BLUE SHIELD | Admitting: *Deleted

## 2014-11-24 DIAGNOSIS — I359 Nonrheumatic aortic valve disorder, unspecified: Secondary | ICD-10-CM

## 2014-11-24 DIAGNOSIS — Z7901 Long term (current) use of anticoagulants: Secondary | ICD-10-CM

## 2014-11-24 DIAGNOSIS — Z5181 Encounter for therapeutic drug level monitoring: Secondary | ICD-10-CM | POA: Diagnosis not present

## 2014-11-24 DIAGNOSIS — Z954 Presence of other heart-valve replacement: Secondary | ICD-10-CM

## 2014-11-24 DIAGNOSIS — Z952 Presence of prosthetic heart valve: Secondary | ICD-10-CM

## 2014-11-24 LAB — POCT INR: INR: 2.1

## 2015-01-05 ENCOUNTER — Ambulatory Visit (INDEPENDENT_AMBULATORY_CARE_PROVIDER_SITE_OTHER): Payer: BLUE CROSS/BLUE SHIELD | Admitting: *Deleted

## 2015-01-05 DIAGNOSIS — Z5181 Encounter for therapeutic drug level monitoring: Secondary | ICD-10-CM | POA: Diagnosis not present

## 2015-01-05 DIAGNOSIS — Z7901 Long term (current) use of anticoagulants: Secondary | ICD-10-CM

## 2015-01-05 DIAGNOSIS — Z954 Presence of other heart-valve replacement: Secondary | ICD-10-CM | POA: Diagnosis not present

## 2015-01-05 DIAGNOSIS — Z952 Presence of prosthetic heart valve: Secondary | ICD-10-CM

## 2015-01-05 DIAGNOSIS — I359 Nonrheumatic aortic valve disorder, unspecified: Secondary | ICD-10-CM | POA: Diagnosis not present

## 2015-01-05 LAB — POCT INR: INR: 2.8

## 2015-01-05 MED ORDER — WARFARIN SODIUM 7.5 MG PO TABS: ORAL_TABLET | ORAL | Status: DC

## 2015-02-16 ENCOUNTER — Ambulatory Visit (INDEPENDENT_AMBULATORY_CARE_PROVIDER_SITE_OTHER): Payer: BLUE CROSS/BLUE SHIELD | Admitting: Pharmacist

## 2015-02-16 DIAGNOSIS — Z954 Presence of other heart-valve replacement: Secondary | ICD-10-CM | POA: Diagnosis not present

## 2015-02-16 DIAGNOSIS — Z5181 Encounter for therapeutic drug level monitoring: Secondary | ICD-10-CM | POA: Diagnosis not present

## 2015-02-16 DIAGNOSIS — Z952 Presence of prosthetic heart valve: Secondary | ICD-10-CM

## 2015-02-16 DIAGNOSIS — I359 Nonrheumatic aortic valve disorder, unspecified: Secondary | ICD-10-CM

## 2015-02-16 DIAGNOSIS — Z7901 Long term (current) use of anticoagulants: Secondary | ICD-10-CM | POA: Diagnosis not present

## 2015-02-16 LAB — POCT INR: INR: 2.4

## 2015-02-16 MED ORDER — WARFARIN SODIUM 7.5 MG PO TABS: ORAL_TABLET | ORAL | Status: DC

## 2015-02-20 ENCOUNTER — Encounter (HOSPITAL_COMMUNITY): Payer: Self-pay | Admitting: Emergency Medicine

## 2015-02-20 ENCOUNTER — Emergency Department (HOSPITAL_COMMUNITY)
Admission: EM | Admit: 2015-02-20 | Discharge: 2015-02-20 | Disposition: A | Discharge status: Home / Self Care | Payer: Worker's Compensation | Attending: Emergency Medicine | Admitting: Emergency Medicine

## 2015-02-20 DIAGNOSIS — E669 Obesity, unspecified: Secondary | ICD-10-CM | POA: Diagnosis not present

## 2015-02-20 DIAGNOSIS — J302 Other seasonal allergic rhinitis: Secondary | ICD-10-CM | POA: Insufficient documentation

## 2015-02-20 DIAGNOSIS — I10 Essential (primary) hypertension: Secondary | ICD-10-CM | POA: Insufficient documentation

## 2015-02-20 DIAGNOSIS — Z954 Presence of other heart-valve replacement: Secondary | ICD-10-CM | POA: Diagnosis not present

## 2015-02-20 DIAGNOSIS — Z9109 Other allergy status, other than to drugs and biological substances: Secondary | ICD-10-CM

## 2015-02-20 DIAGNOSIS — R067 Sneezing: Secondary | ICD-10-CM | POA: Diagnosis present

## 2015-02-20 DIAGNOSIS — Z79899 Other long term (current) drug therapy: Secondary | ICD-10-CM | POA: Diagnosis not present

## 2015-02-20 DIAGNOSIS — E785 Hyperlipidemia, unspecified: Secondary | ICD-10-CM | POA: Insufficient documentation

## 2015-02-20 DIAGNOSIS — Z87891 Personal history of nicotine dependence: Secondary | ICD-10-CM | POA: Diagnosis not present

## 2015-02-20 DIAGNOSIS — Z7901 Long term (current) use of anticoagulants: Secondary | ICD-10-CM | POA: Diagnosis not present

## 2015-02-20 DIAGNOSIS — Z8669 Personal history of other diseases of the nervous system and sense organs: Secondary | ICD-10-CM | POA: Diagnosis not present

## 2015-02-20 DIAGNOSIS — Z7982 Long term (current) use of aspirin: Secondary | ICD-10-CM | POA: Diagnosis not present

## 2015-02-20 MED ORDER — DIPHENHYDRAMINE HCL 25 MG PO CAPS
25.0000 mg | ORAL_CAPSULE | Freq: Once | ORAL | Status: AC
  Administered 2015-02-20: 25 mg via ORAL
  Filled 2015-02-20: qty 1

## 2015-02-20 MED ORDER — ALBUTEROL SULFATE HFA 108 (90 BASE) MCG/ACT IN AERS
1.0000 | INHALATION_SPRAY | RESPIRATORY_TRACT | Status: DC | PRN
  Administered 2015-02-20: 1 via RESPIRATORY_TRACT
  Filled 2015-02-20: qty 6.7

## 2015-02-20 NOTE — ED Notes (Signed)
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 Pt ambulated to room without distress.

## 2015-02-20 NOTE — ED Provider Notes (Signed)
CSN: [id number]     Arrival date & time 02/20/15  4098 History   First MD Initiated Contact with Patient 02/20/15 0735     Chief Complaint  Patient presents with  . Allergic Reaction     (Consider location/radiation/quality/duration/timing/severity/associated sxs/prior Treatment) The history is provided by the patient and medical records.    54 y.o. M with hx of aortic valve replacement , HTN, HLP, obesity, sleep apnea, presenting to the ED for possible allergic reaction.  Patient states his work office was recently found to have mold (not toxic mold) but ceiling tiles were removed last night and when he came in this morning there was dust everywhere.  He states his eyes and nose began to burn with sneezing, having dry cough and skin was itching.  He has no hx of asthma or other airway disease.  Patient denies chest pain or SOB.  No fever, chills, sweats.  No hx of allergies.  No intervention tried PTA.  Past Medical History  Diagnosis Date  . S/P aortic valve replacement     HX of St. Jude  . Chest pain, atypical   . Hypertension     Unspecified  . Hyperlipidemia     Mixed  . Obesity   . Sleep apnea, obstructive    Past Surgical History  Procedure Laterality Date  . Emergency median sternotomy  12/06/2004  . Extracorporeal circulation  12/06/2004  . Replacement of aortic valve  12/06/2004  . Ascending aortic dissection aneurysm  12/06/2004    Using a 27-mm [employer]. Jude mechanical valve conduit with reimplantation of both coronary arteries  . Intraoperative transesophageal echocardiogram  12/06/2004  . Knee surgery      R  . Corneal transplant      R    Family History  Problem Relation Age of Onset  . Cervical cancer Mother   . Coronary artery disease Mother   . Diabetes Mother   . Diabetes Father   . Cervical cancer Sister    Social History  Substance Use Topics  . Smoking status: Former Smoker -- 0.10 packs/day for 18 years    Types: Cigarettes    Quit date: 12/07/1989  .  Smokeless tobacco: Former Neurosurgeon  . Alcohol Use: No     Comment: Quit 1991    Review of Systems  HENT: Positive for sneezing.   Respiratory: Positive for cough.   All other systems reviewed and are negative.     Allergies  Review of patient's allergies indicates no known allergies.  Home Medications   Prior to Admission medications   Medication Sig Start Date End Date Taking? Authorizing Provider  acetaminophen (TYLENOL) 500 MG tablet Take 1,000 mg by mouth every 6 (six) hours as needed for moderate pain.    Historical Provider, MD  Apremilast (OTEZLA) 10 & 20 & 30 MG TBPK Take 30 mg by mouth 2 (two) times daily.     Historical Provider, MD  aspirin 81 MG EC tablet Take 81 mg by mouth daily.      Historical Provider, MD  cholecalciferol (VITAMIN D) 1000 UNITS tablet Take 2,000 Units by mouth 2 (two) times daily.     Historical Provider, MD  gabapentin (NEURONTIN) 100 MG capsule Take 500 mg by mouth at bedtime.  10/01/12   Historical Provider, MD  HYDROcodone-acetaminophen (NORCO/VICODIN) 5-325 MG per tablet Take 2 tablets by mouth every 6 (six) hours as needed for severe pain. Patient not taking: Reported on 05/14/2014 04/21/14   Maxine Glenn, MD  metoprolol succinate (TOPROL-XL) 50 MG 24 hr tablet Take 50 mg by mouth every evening. 08/29/13   Historical Provider, MD  Multiple Vitamin (MULTIVITAMIN WITH MINERALS) TABS tablet Take 1 tablet by mouth daily.    Historical Provider, MD  ondansetron (ZOFRAN) 4 MG tablet Take 1 tablet (4 mg total) by mouth every 6 (six) hours. 04/16/14   Harle Battiest, NP  oxyCODONE-acetaminophen (PERCOCET) 5-325 MG per tablet Take 1 tablet by mouth every 4 (four) hours as needed. Patient not taking: Reported on 05/25/2014 05/14/14   Mancel Bale, MD  rosuvastatin (CRESTOR) 10 MG tablet Take 10 mg by mouth every Monday, Wednesday, and Friday.     Historical Provider, MD  traMADol (ULTRAM) 50 MG tablet Take 50 mg by mouth every 6 (six) hours as needed for  moderate pain.     Historical Provider, MD  traMADol (ULTRAM) 50 MG tablet Take 1 tablet (50 mg total) by mouth every 6 (six) hours as needed. 05/25/14   Cathren Laine, MD  vitamin C (ASCORBIC ACID) 500 MG tablet Take 1,000 mg by mouth daily.     Historical Provider, MD  warfarin (COUMADIN) 7.5 MG tablet TAKE AS DIRECTED BY ANTICOAGULATION CLINIC 02/16/15   Tonny Bollman, MD   BP 157/76 mmHg  Pulse 61  Temp(Src) 97.8 F (36.6 C) (Oral)  Resp 17  SpO2 100%   Physical Exam  Constitutional: He is oriented to person, place, and time. He appears well-developed and well-nourished.  HENT:  Head: Normocephalic and atraumatic.  Right Ear: Tympanic membrane and ear canal normal.  Left Ear: Tympanic membrane and ear canal normal.  Nose: Nose normal.  Mouth/Throat: Oropharynx is clear and moist.  Eyes: Conjunctivae and EOM are normal. Pupils are equal, round, and reactive to light.  Neck: Normal range of motion.  Cardiovascular: Normal rate, regular rhythm and normal heart sounds.   Pulmonary/Chest: Effort normal and breath sounds normal. No respiratory distress. He has no wheezes.  Abdominal: Soft. Bowel sounds are normal.  Musculoskeletal: Normal range of motion.  Neurological: He is alert and oriented to person, place, and time.  Skin: Skin is warm and dry. No rash noted.  Psychiatric: He has a normal mood and affect.  Nursing note and vitals reviewed.   ED Course  Procedures (including critical care time) Labs Review Labs Reviewed - No data to display  Imaging Review No results found. I have personally reviewed and evaluated these images and lab results as part of my medical decision-making.   EKG Interpretation None      MDM   Final diagnoses:  Environmental allergies   54 year old male with what appears to be a reaction to environmental allergens. He had ceiling tiles removed from his office yesterday, when it this morning and there were particles in the air. He had  itchy/watery eyes, sneezing, and dry cough. Patient is afebrile and in no acute respiratory distress. His lungs are clear bilaterally. He has no airway compromise. No visible skin rashes.  VSS.  Patient treated with benadryl, albuterol inhaler given if needed.  Patient appears stable for discharge.  FU with PCP.  Discussed plan with patient, he/she acknowledged understanding and agreed with plan of care.  Return precautions given for new or worsening symptoms.  Garlon Hatchet, PA-C 02/20/15 [id number]  Lyndal Pulley, MD 02/20/15 [phone]

## 2015-02-20 NOTE — Discharge Instructions (Signed)
Continue benadryl as needed for itchy/watery eyes or skin. Use albuterol as needed for cough/sensation of shortness of breath. Return here for new concerns.

## 2015-03-13 ENCOUNTER — Emergency Department (HOSPITAL_COMMUNITY): Payer: Worker's Compensation

## 2015-03-13 ENCOUNTER — Emergency Department (HOSPITAL_COMMUNITY)
Admission: EM | Admit: 2015-03-13 | Discharge: 2015-03-13 | Disposition: A | Discharge status: Home / Self Care | Payer: Worker's Compensation | Attending: Emergency Medicine | Admitting: Emergency Medicine

## 2015-03-13 ENCOUNTER — Encounter (HOSPITAL_COMMUNITY): Payer: Self-pay | Admitting: Emergency Medicine

## 2015-03-13 DIAGNOSIS — Z87891 Personal history of nicotine dependence: Secondary | ICD-10-CM | POA: Diagnosis not present

## 2015-03-13 DIAGNOSIS — E669 Obesity, unspecified: Secondary | ICD-10-CM | POA: Diagnosis not present

## 2015-03-13 DIAGNOSIS — E785 Hyperlipidemia, unspecified: Secondary | ICD-10-CM | POA: Insufficient documentation

## 2015-03-13 DIAGNOSIS — I10 Essential (primary) hypertension: Secondary | ICD-10-CM | POA: Diagnosis not present

## 2015-03-13 DIAGNOSIS — Z79899 Other long term (current) drug therapy: Secondary | ICD-10-CM | POA: Diagnosis not present

## 2015-03-13 DIAGNOSIS — Y9289 Other specified places as the place of occurrence of the external cause: Secondary | ICD-10-CM | POA: Diagnosis not present

## 2015-03-13 DIAGNOSIS — S0031XA Abrasion of nose, initial encounter: Secondary | ICD-10-CM | POA: Diagnosis not present

## 2015-03-13 DIAGNOSIS — Z951 Presence of aortocoronary bypass graft: Secondary | ICD-10-CM | POA: Insufficient documentation

## 2015-03-13 DIAGNOSIS — Y990 Civilian activity done for income or pay: Secondary | ICD-10-CM | POA: Insufficient documentation

## 2015-03-13 DIAGNOSIS — S0990XA Unspecified injury of head, initial encounter: Secondary | ICD-10-CM | POA: Diagnosis present

## 2015-03-13 DIAGNOSIS — R55 Syncope and collapse: Secondary | ICD-10-CM | POA: Diagnosis not present

## 2015-03-13 DIAGNOSIS — G40909 Epilepsy, unspecified, not intractable, without status epilepticus: Secondary | ICD-10-CM | POA: Insufficient documentation

## 2015-03-13 DIAGNOSIS — Z7901 Long term (current) use of anticoagulants: Secondary | ICD-10-CM | POA: Insufficient documentation

## 2015-03-13 DIAGNOSIS — R569 Unspecified convulsions: Secondary | ICD-10-CM

## 2015-03-13 DIAGNOSIS — W1839XA Other fall on same level, initial encounter: Secondary | ICD-10-CM | POA: Insufficient documentation

## 2015-03-13 DIAGNOSIS — Z7982 Long term (current) use of aspirin: Secondary | ICD-10-CM | POA: Diagnosis not present

## 2015-03-13 DIAGNOSIS — S0083XA Contusion of other part of head, initial encounter: Secondary | ICD-10-CM | POA: Insufficient documentation

## 2015-03-13 DIAGNOSIS — Y9389 Activity, other specified: Secondary | ICD-10-CM | POA: Diagnosis not present

## 2015-03-13 HISTORY — DX: Presence of aortocoronary bypass graft: Z95.1

## 2015-03-13 LAB — BASIC METABOLIC PANEL
Anion gap: 11 (ref 5–15)
BUN: 9 mg/dL (ref 6–20)
CHLORIDE: 105 mmol/L (ref 101–111)
CO2: 24 mmol/L (ref 22–32)
Calcium: 9 mg/dL (ref 8.9–10.3)
Creatinine, Ser: 0.77 mg/dL (ref 0.61–1.24)
GFR calc Af Amer: >60 mL/min (ref >60)
GFR calc non Af Amer: >60 mL/min (ref >60)
Glucose, Bld: 79 mg/dL (ref 65–99)
POTASSIUM: 3.5 mmol/L (ref 3.5–5.1)
Sodium: 140 mmol/L (ref 135–145)

## 2015-03-13 LAB — PROTIME-INR
INR: 1.78 — ABNORMAL HIGH (ref 0.00–1.49)
PROTHROMBIN TIME: 20.6 s — AB (ref 11.6–15.2)

## 2015-03-13 LAB — URINALYSIS, ROUTINE W REFLEX MICROSCOPIC
BILIRUBIN URINE: NEGATIVE
GLUCOSE, UA: NEGATIVE mg/dL
Hgb urine dipstick: NEGATIVE
KETONES UR: 15 mg/dL — AB
Leukocytes, UA: NEGATIVE
Nitrite: NEGATIVE
PH: 8 (ref 5.0–8.0)
Protein, ur: NEGATIVE mg/dL
Specific Gravity, Urine: 1.013 (ref 1.005–1.030)
Urobilinogen, UA: 1 mg/dL (ref 0.0–1.0)

## 2015-03-13 LAB — CBG MONITORING, ED: Glucose-Capillary: 91 mg/dL (ref 65–99)

## 2015-03-13 LAB — CBC
HEMATOCRIT: 39.8 % (ref 39.0–52.0)
HEMOGLOBIN: 12.5 g/dL — AB (ref 13.0–17.0)
MCH: 28.5 pg (ref 26.0–34.0)
MCHC: 31.4 g/dL (ref 30.0–36.0)
MCV: 90.7 fL (ref 78.0–100.0)
Platelets: 189 10*3/uL (ref 150–400)
RBC: 4.39 MIL/uL (ref 4.22–5.81)
RDW: 13.9 % (ref 11.5–15.5)
WBC: 7.8 10*3/uL (ref 4.0–10.5)

## 2015-03-13 MED ORDER — OXYCODONE-ACETAMINOPHEN 5-325 MG PO TABS
2.0000 | ORAL_TABLET | Freq: Once | ORAL | Status: AC
  Administered 2015-03-13: 2 via ORAL
  Filled 2015-03-13: qty 2

## 2015-03-13 MED ORDER — OXYCODONE-ACETAMINOPHEN 5-325 MG PO TABS: 1.0000 | ORAL_TABLET | ORAL | Status: DC | PRN

## 2015-03-13 MED ORDER — FENTANYL CITRATE (PF) 100 MCG/2ML IJ SOLN
100.0000 ug | Freq: Once | INTRAMUSCULAR | Status: AC
  Administered 2015-03-13: 100 ug via INTRAVENOUS
  Filled 2015-03-13: qty 2

## 2015-03-13 MED ORDER — ONDANSETRON HCL 4 MG/2ML IJ SOLN
4.0000 mg | Freq: Once | INTRAMUSCULAR | Status: AC
  Administered 2015-03-13: 4 mg via INTRAVENOUS
  Filled 2015-03-13: qty 2

## 2015-03-13 MED ORDER — KETOROLAC TROMETHAMINE 30 MG/ML IJ SOLN
30.0000 mg | Freq: Once | INTRAMUSCULAR | Status: AC
  Administered 2015-03-13: 30 mg via INTRAVENOUS
  Filled 2015-03-13: qty 1

## 2015-03-13 NOTE — Discharge Instructions (Signed)
Contusion A contusion is a deep bruise. Contusions happen when an injury causes bleeding under the skin. Symptoms of bruising include pain, swelling, and discolored skin. The skin may turn blue, purple, or yellow. HOME CARE   Rest the injured area.  If told, put ice on the injured area.  Put ice in a plastic bag.  Place a towel between your skin and the bag.  Leave the ice on for 20 minutes, 2-3 times per day.  If told, put light pressure (compression) on the injured area using an elastic bandage. Make sure the bandage is not too tight. Remove it and put it back on as told by your doctor.  If possible, raise (elevate) the injured area above the level of your heart while you are sitting or lying down.  Take over-the-counter and prescription medicines only as told by your doctor. GET HELP IF:  Your symptoms do not get better after several days of treatment.  Your symptoms get worse.  You have trouble moving the injured area. GET HELP RIGHT AWAY IF:   You have very bad pain.  You have a loss of feeling (numbness) in a hand or foot.  Your hand or foot turns pale or cold.   This information is not intended to replace advice given to you by your health care provider. Make sure you discuss any questions you have with your health care provider.   Document Released: 11/12/2007 Document Revised: 02/14/2015 Document Reviewed: 10/11/2014 Elsevier Interactive Patient Education 2016 [employer]  Head Injury, Adult You have a head injury. Headaches and throwing up (vomiting) are common after a head injury. It should be easy to wake up from sleeping. Sometimes you must stay in the hospital. Most problems happen within the first 24 hours. Side effects may occur up to 7-10 days after the injury.  WHAT ARE THE TYPES OF HEAD INJURIES? Head injuries can be as minor as a bump. Some head injuries can be more severe. More severe head injuries include:  A jarring injury to the brain  (concussion).  A bruise of the brain (contusion). This mean there is bleeding in the brain that can cause swelling.  A cracked skull (skull fracture).  Bleeding in the brain that collects, clots, and forms a bump (hematoma). WHEN SHOULD I GET HELP RIGHT AWAY?   You are confused or sleepy.  You cannot be woken up.  You feel sick to your stomach (nauseous) or keep throwing up (vomiting).  Your dizziness or unsteadiness is getting worse.  You have very bad, lasting headaches that are not helped by medicine. Take medicines only as told by your doctor.  You cannot use your arms or legs like normal.  You cannot walk.  You notice changes in the black spots in the center of the colored part of your eye (pupil).  You have clear or bloody fluid coming from your nose or ears.  You have trouble seeing. During the next 24 hours after the injury, you must stay with someone who can watch you. This person should get help right away (call 911 in the [place] if you start to shake and are not able to control it (have seizures), you pass out, or you are unable to wake up. HOW CAN I PREVENT A HEAD INJURY IN THE FUTURE?  Wear seat belts.  Wear a helmet while bike riding and playing sports like football.  Stay away from dangerous activities around the house. WHEN CAN I RETURN TO NORMAL ACTIVITIES AND ATHLETICS?  See your doctor before doing these activities. You should not do normal activities or play contact sports until 1 week after the following symptoms have stopped:  Headache that does not go away.  Dizziness.  Poor attention.  Confusion.  Memory problems.  Sickness to your stomach or throwing up.  Tiredness.  Fussiness.  Bothered by bright lights or loud noises.  Anxiousness or depression.  Restless sleep. MAKE SURE YOU:   Understand these instructions.  Will watch your condition.  Will get help right away if you are not doing well or get worse.   This information is  not intended to replace advice given to you by your health care provider. Make sure you discuss any questions you have with your health care provider.   Document Released: 05/08/2008 Document Revised: 06/16/2014 Document Reviewed: 01/31/2013 Elsevier Interactive Patient Education [employer]  Seizure, Adult A seizure is abnormal electrical activity in the brain. Seizures usually last from 30 seconds to 2 minutes. There are various types of seizures. Before a seizure, you may have a warning sensation (aura) that a seizure is about to occur. An aura may include the following symptoms:   Fear or anxiety.  Nausea.  Feeling like the room is spinning (vertigo).  Vision changes, such as seeing flashing lights or spots. Common symptoms during a seizure include:  A change in attention or behavior (altered mental status).  Convulsions with rhythmic jerking movements.  Drooling.  Rapid eye movements.  Grunting.  Loss of bladder and bowel control.  Bitter taste in the mouth.  Tongue biting. After a seizure, you may feel confused and sleepy. You may also have an injury resulting from convulsions during the seizure. HOME CARE INSTRUCTIONS   If you are given medicines, take them exactly as prescribed by your health care provider.  Keep all follow-up appointments as directed by your health care provider.  Do not swim or drive or engage in risky activity during which a seizure could cause further injury to you or others until your health care provider says it is OK.  Get adequate rest.  Teach friends and family what to do if you have a seizure. They should:  Lay you on the ground to prevent a fall.  Put a cushion under your head.  Loosen any tight clothing around your neck.  Turn you on your side. If vomiting occurs, this helps keep your airway clear.  Stay with you until you recover.  Know whether or not you need emergency care. SEEK IMMEDIATE MEDICAL CARE IF:  The  seizure lasts longer than 5 minutes.  The seizure is severe or you do not wake up immediately after the seizure.  You have an altered mental status after the seizure.  You are having more frequent or worsening seizures. Someone should drive you to the emergency department or call local emergency services (911 in [place] MAKE SURE YOU:  Understand these instructions.  Will watch your condition.  Will get help right away if you are not doing well or get worse.   This information is not intended to replace advice given to you by your health care provider. Make sure you discuss any questions you have with your health care provider.   Document Released: 05/23/2000 Document Revised: 06/16/2014 Document Reviewed: 01/05/2013 Elsevier Interactive Patient Education [employer]

## 2015-03-13 NOTE — ED Notes (Signed)
Pt left will all belongings and was wheeled out of the treatment area.

## 2015-03-13 NOTE — ED Notes (Signed)
Pt from work via [employer] with c/o witnessed shaking, followed by "going stiff," and then having a syncopal episode.  Pt had positive LOC for approx 30 seconds to 1 minute.  Pt reports taking Topamax for the last 3 weeks for weight loss.  GCS 14, becoming more oriented.  CBG on scene was 68, given 1 amp D50 with increase in CBG to 111.  Small laceration to bridge of nose from glasses.  Pt in NAD, A&O.

## 2015-03-13 NOTE — ED Notes (Signed)
Pt unable to remember year after reorientation.  Pt able to calculate year from DOB and knowing his age.

## 2015-03-13 NOTE — ED Provider Notes (Signed)
CSN: [id number]     Arrival date & time 03/13/15  1639 History   First MD Initiated Contact with Patient 03/13/15 1658     Chief Complaint  Patient presents with  . Loss of Consciousness  . Head Injury  . New onset seizure?      (Consider location/radiation/quality/duration/timing/severity/associated sxs/prior Treatment) HPI   Patient was at work, working in a small room when he had sudden onset of shaking, followed by a fall with loss of consciousness for 5 minutes. He was transferred here by EMS for evaluation. On arrival, he complains of headache and nausea. Recently, the patient has been using Topamax, to help him lose weight. The patient. CBG was somewhat low at the scene, 68, and he was treated with IV D50. He was in the emergency department about 3 weeks ago with an allergic reaction.  Past Medical History  Diagnosis Date  . S/P aortic valve replacement     HX of St. Jude  . Chest pain, atypical   . Hypertension     Unspecified  . Hyperlipidemia     Mixed  . Obesity   . Sleep apnea, obstructive   . Hx of CABG    Past Surgical History  Procedure Laterality Date  . Emergency median sternotomy  12/06/2004  . Extracorporeal circulation  12/06/2004  . Replacement of aortic valve  12/06/2004  . Ascending aortic dissection aneurysm  12/06/2004    Using a 27-mm St. Jude mechanical valve conduit with reimplantation of both coronary arteries  . Intraoperative transesophageal echocardiogram  12/06/2004  . Knee surgery      R  . Corneal transplant      R    Family History  Problem Relation Age of Onset  . Cervical cancer Mother   . Coronary artery disease Mother   . Diabetes Mother   . Diabetes Father   . Cervical cancer Sister    Social History  Substance Use Topics  . Smoking status: Former Smoker -- 0.10 packs/day for 18 years    Types: Cigarettes    Quit date: 12/07/1989  . Smokeless tobacco: Former Neurosurgeon  . Alcohol Use: No     Comment: Quit 1991    Review of  Systems    Allergies  Review of patient's allergies indicates no known allergies.  Home Medications   Prior to Admission medications   Medication Sig Start Date End Date Taking? Authorizing Provider  acetaminophen (TYLENOL) 500 MG tablet Take 1,000 mg by mouth every 6 (six) hours as needed for mild pain or moderate pain.    Yes Historical Provider, MD  aspirin 81 MG EC tablet Take 81 mg by mouth daily.     Yes Historical Provider, MD  cholecalciferol (VITAMIN D) 1000 UNITS tablet Take 2,000 Units by mouth 2 (two) times daily.    Yes Historical Provider, MD  Liraglutide (VICTOZA [place]) Inject 12 Units into the skin daily.    Yes Historical Provider, MD  metoprolol succinate (TOPROL-XL) 50 MG 24 hr tablet Take 50 mg by mouth 2 (two) times daily.  08/29/13  Yes Historical Provider, MD  Multiple Vitamin (MULTIVITAMIN WITH MINERALS) TABS tablet Take 1 tablet by mouth daily.   Yes Historical Provider, MD  rosuvastatin (CRESTOR) 10 MG tablet Take 10 mg by mouth daily.    Yes Historical Provider, MD  traMADol (ULTRAM) 50 MG tablet Take 50 mg by mouth every 6 (six) hours as needed for moderate pain.    Yes Historical Provider, MD  traMADol (  ULTRAM) 50 MG tablet Take 1 tablet (50 mg total) by mouth every 6 (six) hours as needed. Patient taking differently: Take 50 mg by mouth every 6 (six) hours as needed for moderate pain.  05/25/14  Yes Cathren Laine, MD  warfarin (COUMADIN) 7.5 MG tablet TAKE AS DIRECTED BY ANTICOAGULATION CLINIC Patient taking differently: Take 7.5 mg by mouth See admin instructions. TAKE AS DIRECTED BY ANTICOAGULATION CLINIC 02/16/15  Yes Tonny Bollman, MD  HYDROcodone-acetaminophen (NORCO/VICODIN) 5-325 MG per tablet Take 2 tablets by mouth every 6 (six) hours as needed for severe pain. Patient not taking: Reported on 05/14/2014 04/21/14   Maxine Glenn, MD  ondansetron (ZOFRAN) 4 MG tablet Take 1 tablet (4 mg total) by mouth every 6 (six) hours. Patient not taking: Reported on  03/13/2015 04/16/14   Harle Battiest, NP  oxyCODONE-acetaminophen (PERCOCET) 5-325 MG tablet Take 1 tablet by mouth every 4 (four) hours as needed for severe pain. 03/13/15   Mancel Bale, MD  vitamin C (ASCORBIC ACID) 500 MG tablet Take 1,000 mg by mouth daily.     Historical Provider, MD   BP 122/57 mmHg  Pulse 105  Temp(Src) 98.5 F (36.9 C) (Oral)  Resp 25  SpO2 96% Physical Exam  Constitutional: He is oriented to person, place, and time. He appears well-developed and well-nourished.  HENT:  Head: Normocephalic.  Right Ear: External ear normal.  Left Ear: External ear normal.  Contusion, left for head. Abrasion bridge of nose. No tongue abrasion  Eyes: Conjunctivae and EOM are normal. Pupils are equal, round, and reactive to light.  Neck: Normal range of motion and phonation normal. Neck supple.  Cardiovascular: Normal rate, regular rhythm and normal heart sounds.   Pulmonary/Chest: Effort normal and breath sounds normal. He exhibits no bony tenderness.  Abdominal: Soft. There is no tenderness.  Musculoskeletal: Normal range of motion.  Moves arms and legs normally.  Neurological: He is alert and oriented to person, place, and time. No cranial nerve deficit or sensory deficit. He exhibits normal muscle tone. Coordination normal.  No dysarthria or aphasia  Skin: Skin is warm, dry and intact.  Psychiatric: He has a normal mood and affect. His behavior is normal. Judgment and thought content normal.  Nursing note and vitals reviewed.   ED Course  Procedures (including critical care time)  20:25 - Note- cardiopulmonary monitoring indicates periods of low pulse, which actually reflects irregular sampling by pulse oxygenation, and not an abnormal peripheral pulse. Also, the respiratory rate elevation, around the time 18:22, appears to be erroneous. Patient has not had any episodes of bradycardia, apnea, respiratory distress while in the emergency department.  Medications   ondansetron (ZOFRAN) injection 4 mg (4 mg Intravenous Given 03/13/15 1819)  fentaNYL (SUBLIMAZE) injection 100 mcg (100 mcg Intravenous Given 03/13/15 1820)  oxyCODONE-acetaminophen (PERCOCET/ROXICET) 5-325 MG per tablet 2 tablet (2 tablets Oral Given 03/13/15 2056)  ketorolac (TORADOL) 30 MG/ML injection 30 mg (30 mg Intravenous Given 03/13/15 2213)      8:29 PM Reevaluation with update and discussion. After initial assessment and treatment, an updated evaluation reveals he remains alert. He complains of pain in his head, and low back. Low back examined- is able to roll over and does not have any palpable tenderness of thoracic or lumbar spines. Additional pain medication ordered. Brooklee Michelin L   10:41 PM Reevaluation with update and discussion. After initial assessment and treatment, an updated evaluation reveals he was able to handle. He easily. No additional complaints. Findings discussed with patient and  family members, all questions were answered. Neal Oshea L      Labs Review Labs Reviewed  CBC - Abnormal; Notable for the following:    Hemoglobin 12.5 (*)    All other components within normal limits  URINALYSIS, ROUTINE W REFLEX MICROSCOPIC (NOT AT [facility]) - Abnormal; Notable for the following:    Ketones, ur 15 (*)    All other components within normal limits  PROTIME-INR - Abnormal; Notable for the following:    Prothrombin Time 20.6 (*)    INR 1.78 (*)    All other components within normal limits  BASIC METABOLIC PANEL  CBG MONITORING, ED    Imaging Review Ct Head Wo Contrast  03/13/2015   CLINICAL DATA:  Head and neck pain.  EXAM: CT HEAD WITHOUT CONTRAST  CT CERVICAL SPINE WITHOUT CONTRAST  TECHNIQUE: Multidetector CT imaging of the head and cervical spine was performed following the standard protocol without intravenous contrast. Multiplanar CT image reconstructions of the cervical spine were also generated.  COMPARISON:  CT scan of head of May 30, 2014.  FINDINGS:  CT HEAD FINDINGS  Mild right frontal scalp hematoma is noted. Bony calvarium appears intact. No mass effect or midline shift is noted. Ventricular size is within normal limits. There is no evidence of mass lesion, hemorrhage or acute infarction.  CT CERVICAL SPINE FINDINGS  No fracture or spondylolisthesis is noted. Moderate degenerative disc disease is noted at C5-6 and C6-7 with anterior osteophyte formation. Posterior facet joints appear intact. Visualized lung apices appear normal.  IMPRESSION: Mild right frontal scalp hematoma. No acute intracranial abnormality seen.  Moderate degenerative disc disease is noted at C5-6 and C6-7. No acute abnormality seen in the cervical spine.   Electronically Signed   By: Lupita Raider, M.D.   On: 03/13/2015 18:18   Ct Cervical Spine Wo Contrast  03/13/2015   CLINICAL DATA:  Head and neck pain.  EXAM: CT HEAD WITHOUT CONTRAST  CT CERVICAL SPINE WITHOUT CONTRAST  TECHNIQUE: Multidetector CT imaging of the head and cervical spine was performed following the standard protocol without intravenous contrast. Multiplanar CT image reconstructions of the cervical spine were also generated.  COMPARISON:  CT scan of head of May 30, 2014.  FINDINGS: CT HEAD FINDINGS  Mild right frontal scalp hematoma is noted. Bony calvarium appears intact. No mass effect or midline shift is noted. Ventricular size is within normal limits. There is no evidence of mass lesion, hemorrhage or acute infarction.  CT CERVICAL SPINE FINDINGS  No fracture or spondylolisthesis is noted. Moderate degenerative disc disease is noted at C5-6 and C6-7 with anterior osteophyte formation. Posterior facet joints appear intact. Visualized lung apices appear normal.  IMPRESSION: Mild right frontal scalp hematoma. No acute intracranial abnormality seen.  Moderate degenerative disc disease is noted at C5-6 and C6-7. No acute abnormality seen in the cervical spine.   Electronically Signed   By: Lupita Raider, M.D.    On: 03/13/2015 18:18    I have personally reviewed and evaluated these images and lab results as part of my medical decision-making.   EKG Interpretation   Date/Time:  Tuesday March 13 2015 17:02:17 EDT Ventricular Rate:  79 PR Interval:  201 QRS Duration: 99 QT Interval:  396 QTC Calculation: 454 R Axis:   3 Text Interpretation:  Sinus rhythm Borderline repolarization abnormality  Since last tracing rate faster Confirmed by Effie Shy  MD, Hulan Szumski ([id number]) on  03/13/2015 7:51:07 PM      MDM  Final diagnoses:  Seizure (HCC)  Head injury, initial encounter  Contusion of face, initial encounter    Reported seizure with altered mental status, possibly loss of consciousness, and head injury. No history of seizure disorder. No metabolic abnormalities typical of seizure etiology. No evidence for serious acute intracranial injury.  Doubt serous bacterial infection or impending vascular collapse.  Nursing Notes Reviewed/ Care Coordinated Applicable Imaging Reviewed Interpretation of Laboratory Data incorporated into ED treatment  The patient appears reasonably screened and/or stabilized for discharge and I doubt any other medical condition or other [facility] requiring further screening, evaluation, or treatment in the ED at this time prior to discharge.  Plan: Home Medications- usual; Home Treatments-Driving precaution s given,  rest; return here if the recommended treatment, does not improve the symptoms; Recommended follow up- Neurology 1-2 weeks. Referral made. Work release 5 days   Mancel Bale, MD 03/13/15 2243

## 2015-03-16 ENCOUNTER — Encounter (HOSPITAL_COMMUNITY): Payer: Self-pay | Admitting: Nurse Practitioner

## 2015-03-16 ENCOUNTER — Inpatient Hospital Stay (HOSPITAL_COMMUNITY)
Admission: EM | Admit: 2015-03-16 | Discharge: 2015-03-28 | DRG: 083 | Disposition: A | Discharge status: Home Health | Payer: BLUE CROSS/BLUE SHIELD | Attending: Internal Medicine | Admitting: Internal Medicine

## 2015-03-16 ENCOUNTER — Emergency Department (HOSPITAL_COMMUNITY): Payer: BLUE CROSS/BLUE SHIELD

## 2015-03-16 DIAGNOSIS — R200 Anesthesia of skin: Secondary | ICD-10-CM | POA: Diagnosis not present

## 2015-03-16 DIAGNOSIS — G44309 Post-traumatic headache, unspecified, not intractable: Secondary | ICD-10-CM | POA: Diagnosis present

## 2015-03-16 DIAGNOSIS — Z66 Do not resuscitate: Secondary | ICD-10-CM | POA: Diagnosis present

## 2015-03-16 DIAGNOSIS — E162 Hypoglycemia, unspecified: Secondary | ICD-10-CM | POA: Diagnosis present

## 2015-03-16 DIAGNOSIS — G4733 Obstructive sleep apnea (adult) (pediatric): Secondary | ICD-10-CM | POA: Diagnosis present

## 2015-03-16 DIAGNOSIS — R791 Abnormal coagulation profile: Secondary | ICD-10-CM | POA: Diagnosis present

## 2015-03-16 DIAGNOSIS — I2510 Atherosclerotic heart disease of native coronary artery without angina pectoris: Secondary | ICD-10-CM | POA: Diagnosis present

## 2015-03-16 DIAGNOSIS — Z886 Allergy status to analgesic agent status: Secondary | ICD-10-CM

## 2015-03-16 DIAGNOSIS — Z7901 Long term (current) use of anticoagulants: Secondary | ICD-10-CM

## 2015-03-16 DIAGNOSIS — S065X9A Traumatic subdural hemorrhage with loss of consciousness of unspecified duration, initial encounter: Secondary | ICD-10-CM | POA: Diagnosis not present

## 2015-03-16 DIAGNOSIS — S065XAA Traumatic subdural hemorrhage with loss of consciousness status unknown, initial encounter: Secondary | ICD-10-CM | POA: Diagnosis present

## 2015-03-16 DIAGNOSIS — Z951 Presence of aortocoronary bypass graft: Secondary | ICD-10-CM

## 2015-03-16 DIAGNOSIS — Z6841 Body Mass Index (BMI) 40.0 and over, adult: Secondary | ICD-10-CM

## 2015-03-16 DIAGNOSIS — I10 Essential (primary) hypertension: Secondary | ICD-10-CM | POA: Diagnosis present

## 2015-03-16 DIAGNOSIS — E785 Hyperlipidemia, unspecified: Secondary | ICD-10-CM | POA: Diagnosis present

## 2015-03-16 DIAGNOSIS — R29898 Other symptoms and signs involving the musculoskeletal system: Secondary | ICD-10-CM | POA: Insufficient documentation

## 2015-03-16 DIAGNOSIS — G4089 Other seizures: Secondary | ICD-10-CM | POA: Diagnosis present

## 2015-03-16 DIAGNOSIS — H5340 Unspecified visual field defects: Secondary | ICD-10-CM | POA: Diagnosis present

## 2015-03-16 DIAGNOSIS — Z7982 Long term (current) use of aspirin: Secondary | ICD-10-CM

## 2015-03-16 DIAGNOSIS — Z952 Presence of prosthetic heart valve: Secondary | ICD-10-CM

## 2015-03-16 DIAGNOSIS — Z87891 Personal history of nicotine dependence: Secondary | ICD-10-CM

## 2015-03-16 DIAGNOSIS — R0789 Other chest pain: Secondary | ICD-10-CM | POA: Diagnosis not present

## 2015-03-16 DIAGNOSIS — M79605 Pain in left leg: Secondary | ICD-10-CM

## 2015-03-16 DIAGNOSIS — Z79891 Long term (current) use of opiate analgesic: Secondary | ICD-10-CM

## 2015-03-16 DIAGNOSIS — R55 Syncope and collapse: Secondary | ICD-10-CM | POA: Insufficient documentation

## 2015-03-16 DIAGNOSIS — I6200 Nontraumatic subdural hemorrhage, unspecified: Secondary | ICD-10-CM | POA: Diagnosis not present

## 2015-03-16 HISTORY — DX: Personal history of other diseases of the circulatory system: Z86.79

## 2015-03-16 LAB — CBC WITH DIFFERENTIAL/PLATELET
Basophils Absolute: 0 10*3/uL (ref 0.0–0.1)
Basophils Relative: 1 %
Eosinophils Absolute: 0.1 10*3/uL (ref 0.0–0.7)
Eosinophils Relative: 2 %
HCT: 39.6 % (ref 39.0–52.0)
HEMOGLOBIN: 12.3 g/dL — AB (ref 13.0–17.0)
LYMPHS ABS: 1.5 10*3/uL (ref 0.7–4.0)
LYMPHS PCT: 22 %
MCH: 28.5 pg (ref 26.0–34.0)
MCHC: 31.1 g/dL (ref 30.0–36.0)
MCV: 91.9 fL (ref 78.0–100.0)
Monocytes Absolute: 1 10*3/uL (ref 0.1–1.0)
Monocytes Relative: 14 %
NEUTROS PCT: 61 %
Neutro Abs: 4.2 10*3/uL (ref 1.7–7.7)
Platelets: 197 10*3/uL (ref 150–400)
RBC: 4.31 MIL/uL (ref 4.22–5.81)
RDW: 14.2 % (ref 11.5–15.5)
WBC: 6.8 10*3/uL (ref 4.0–10.5)

## 2015-03-16 LAB — BASIC METABOLIC PANEL
Anion gap: 7 (ref 5–15)
BUN: 9 mg/dL (ref 6–20)
CHLORIDE: 103 mmol/L (ref 101–111)
CO2: 28 mmol/L (ref 22–32)
Calcium: 8.8 mg/dL — ABNORMAL LOW (ref 8.9–10.3)
Creatinine, Ser: 0.85 mg/dL (ref 0.61–1.24)
GFR calc Af Amer: >60 mL/min (ref >60)
GFR calc non Af Amer: >60 mL/min (ref >60)
GLUCOSE: 85 mg/dL (ref 65–99)
POTASSIUM: 3.8 mmol/L (ref 3.5–5.1)
Sodium: 138 mmol/L (ref 135–145)

## 2015-03-16 LAB — PROTIME-INR
INR: 1.83 — ABNORMAL HIGH (ref 0.00–1.49)
Prothrombin Time: 21.1 seconds — ABNORMAL HIGH (ref 11.6–15.2)

## 2015-03-16 MED ORDER — METOCLOPRAMIDE HCL 5 MG/ML IJ SOLN
10.0000 mg | Freq: Once | INTRAMUSCULAR | Status: AC
  Administered 2015-03-16: 10 mg via INTRAVENOUS
  Filled 2015-03-16: qty 2

## 2015-03-16 MED ORDER — SODIUM CHLORIDE 0.9 % IV SOLN
1000.0000 mg | Freq: Once | INTRAVENOUS | Status: AC
  Administered 2015-03-17: 1000 mg via INTRAVENOUS
  Filled 2015-03-16: qty 10

## 2015-03-16 MED ORDER — DIPHENHYDRAMINE HCL 50 MG/ML IJ SOLN
25.0000 mg | Freq: Once | INTRAMUSCULAR | Status: AC
  Administered 2015-03-16: 25 mg via INTRAVENOUS
  Filled 2015-03-16: qty 1

## 2015-03-16 NOTE — ED Provider Notes (Signed)
CSN: [id number]     Arrival date & time 03/16/15  1530 History   First MD Initiated Contact with Patient 03/16/15 1613     Chief Complaint  Patient presents with  . Headache     (Consider location/radiation/quality/duration/timing/severity/associated sxs/prior Treatment) HPI Comments: Patient seen on 03/13/15 after reported seizure, with associated head injury. No acute intracranial injury. Patient discharged home with neurology referral.  Patient has continued to have headaches since discharge, along with periods of brief confusion and short-term memory loss. Wife reports initial seizure activity appeared to be tonic-clonic by description. Today, patient may have had another seizure, but entailed patient just staring off into space.  Patient is a 54 y.o. male presenting with headaches. The history is provided by the patient and the spouse. No language interpreter was used.  Headache Pain location:  Generalized Duration:  3 days Timing:  Intermittent Progression:  Waxing and waning Similar to prior headaches: no   Associated symptoms: visual change   Visual Change:    Location:  R eye   Quality: blind spots     Chronicity:  New   Past Medical History  Diagnosis Date  . S/P aortic valve replacement     HX of St. Jude  . Chest pain, atypical   . Hypertension     Unspecified  . Hyperlipidemia     Mixed  . Obesity   . Sleep apnea, obstructive   . Hx of CABG    Past Surgical History  Procedure Laterality Date  . Emergency median sternotomy  12/06/2004  . Extracorporeal circulation  12/06/2004  . Replacement of aortic valve  12/06/2004  . Ascending aortic dissection aneurysm  12/06/2004    ok for MRI 1.5 or 3T Using a 27-mm St. Jude mechanical valve conduit with reimplantation of both coronary arteries  . Intraoperative transesophageal echocardiogram  12/06/2004  . Knee surgery      R  . Corneal transplant      R    Family History  Problem Relation Age of Onset  . Cervical  cancer Mother   . Coronary artery disease Mother   . Diabetes Mother   . Diabetes Father   . Cervical cancer Sister    Social History  Substance Use Topics  . Smoking status: Former Smoker -- 0.10 packs/day for 18 years    Types: Cigarettes    Quit date: 12/07/1989  . Smokeless tobacco: Former Neurosurgeon  . Alcohol Use: No     Comment: Quit 1991    Review of Systems  Eyes: Positive for visual disturbance.  Neurological: Positive for headaches. Negative for tremors and speech difficulty.  Psychiatric/Behavioral: Positive for confusion and decreased concentration.  All other systems reviewed and are negative.     Allergies  Nsaids  Home Medications   Prior to Admission medications   Medication Sig Start Date End Date Taking? Authorizing Provider  aspirin 81 MG EC tablet Take 81 mg by mouth daily.     Yes Historical Provider, MD  cholecalciferol (VITAMIN D) 1000 UNITS tablet Take 2,000 Units by mouth 2 (two) times daily.    Yes Historical Provider, MD  Ixekizumab (TALTZ [place]) Inject 1 application into the skin every 30 (thirty) days.   Yes Historical Provider, MD  Liraglutide (VICTOZA [place]) Inject 1.2 mg into the skin daily.    Yes Historical Provider, MD  metoprolol succinate (TOPROL-XL) 50 MG 24 hr tablet Take 50 mg by mouth daily.  08/29/13  Yes Historical Provider, MD  Multiple Vitamin (  MULTIVITAMIN WITH MINERALS) TABS tablet Take 1 tablet by mouth daily.   Yes Historical Provider, MD  rosuvastatin (CRESTOR) 10 MG tablet Take 10 mg by mouth daily.    Yes Historical Provider, MD  traMADol (ULTRAM) 50 MG tablet Take 1 tablet (50 mg total) by mouth every 6 (six) hours as needed. Patient taking differently: Take 50 mg by mouth every 6 (six) hours as needed for moderate pain.  05/25/14  Yes Cathren Laine, MD  warfarin (COUMADIN) 7.5 MG tablet TAKE AS DIRECTED BY ANTICOAGULATION CLINIC Patient taking differently: Take 3.75-7.5 mg by mouth daily at 6 PM. Takes 3.75mg  on Fri only Takes 7.5mg   all other days 02/16/15  Yes Tonny Bollman, MD  HYDROcodone-acetaminophen (NORCO/VICODIN) 5-325 MG per tablet Take 2 tablets by mouth every 6 (six) hours as needed for severe pain. Patient not taking: Reported on 05/14/2014 04/21/14   Maxine Glenn, MD  ondansetron (ZOFRAN) 4 MG tablet Take 1 tablet (4 mg total) by mouth every 6 (six) hours. Patient not taking: Reported on 03/13/2015 04/16/14   Harle Battiest, NP  oxyCODONE-acetaminophen (PERCOCET) 5-325 MG tablet Take 1 tablet by mouth every 4 (four) hours as needed for severe pain. 03/13/15   Mancel Bale, MD   BP 137/76 mmHg  Pulse 54  Temp(Src) 98.2 F (36.8 C) (Oral)  Resp 12  Ht  (1.88 m)  Wt 321 lb (145.605 kg)  BMI 41.20 kg/m2  SpO2 95% Physical Exam  Constitutional: He is oriented to person, place, and time. He appears well-developed and well-nourished.  HENT:  Head: Normocephalic.  Neck: Normal range of motion. Neck supple.  Cardiovascular: Normal rate.   Aortic valve "click" noted.  Pulmonary/Chest: Effort normal and breath sounds normal.  Abdominal: Soft. Bowel sounds are normal.  Musculoskeletal: He exhibits edema. He exhibits no tenderness.  Neurological: He is alert and oriented to person, place, and time. He has normal strength. A cranial nerve deficit is present.  Patient with vision deficit in right eye, unable to see peripherally until stimulus is 12-15 inches anteriorly.  Patient also unable to successfully perform finger to nose test.  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  Skin: Skin is warm and dry.  Psychiatric: He has a normal mood and affect.  Nursing note and vitals reviewed.   ED Course  Procedures (including critical care time) Labs Review Labs Reviewed  PROTIME-INR - Abnormal; Notable for the following:    Prothrombin Time 21.1 (*)    INR 1.83 (*)    All other components within normal limits  CBC WITH DIFFERENTIAL/PLATELET - Abnormal; Notable for the following:     Hemoglobin 12.3 (*)    All other components within normal limits  BASIC METABOLIC PANEL - Abnormal; Notable for the following:    Calcium 8.8 (*)    All other components within normal limits    Imaging Review Mr Brain Wo Contrast  03/16/2015   CLINICAL DATA:  Newly diagnosed seizures after a syncopal episode with fall. Headache. Visual field defect.  EXAM: MRI HEAD WITHOUT CONTRAST  TECHNIQUE: Multiplanar, multiecho pulse sequences of the brain and surrounding structures were obtained without intravenous contrast.  COMPARISON:  Head CT in 03/13/2015  FINDINGS: There is a small volume, likely subacute subdural hematoma over the left cerebral convexity, largest in the frontal region where it measures up to 6 mm in thickness. There is mild left cerebral hemispheric sulcal effacement, and there is 4 mm of rightward midline shift. A small amount  of subdural hemorrhage is also present in the interhemispheric fissure along the left aspect of the falx. There is no hydrocephalus. No acute infarct, parenchymal hemorrhage, or mass is seen.  The hippocampi are grossly symmetric in size and signal, however detailed evaluation is precluded by prominent motion artifact on thin section images through the temporal lobes. No significant white matter disease is seen. Cerebral volume is normal for age.  Prior right cataract extraction is noted. There is mild left maxillary sinus mucosal thickening. Mastoid air cells are clear. Major intracranial vascular flow voids are preserved.  IMPRESSION: Small subdural hematoma over the left cerebral convexity and along the falx. 4 mm rightward midline shift.  Critical Value/emergent results were called by telephone at the time of interpretation on 03/16/2015 at 9:17 pm to Dr. Madilyn Hook, who verbally acknowledged these results.   Electronically Signed   By: Sebastian Ache M.D.   On: 03/16/2015 21:21   I have personally reviewed and evaluated these images and lab results as part of my medical  decision-making.   EKG Interpretation   Date/Time:  Friday March 16 2015 17:42:06 EDT Ventricular Rate:  110 PR Interval:  222 QRS Duration: 94 QT Interval:  410 QTC Calculation: 555 R Axis:   16 Text Interpretation:  Sinus or ectopic atrial tachycardia Supraventricular  bigeminy Prolonged PR interval Probable anteroseptal infarct, old  Prolonged QT interval Confirmed by Lincoln Brigham [phone]) on 03/16/2015 5:57:15  PM     Patient discussed with Dr. Madilyn Hook. Spoke with Dr. Conchita Paris with neurosurgery--he reviewed MRI from today and CT from 03/13/15.  Subdural appears sub-acute, is small, and will not require surgical intervention. Spoke with Dr. Roseanne Reno with neurology--he recommends giving patient a loading dose of keppra (1 gram IV), with 500 mg BID upon discharge. Other symptomology may be due to post-concussive syndrome. Current concern is patient having mechanical aortic valve and being sub-therapeutic on his coumadin. Will ask medicine to admit to monitor and potentially bridge with lovenox.  Discussed patient with cardiology Sherren Kerns).  Risk of emboli with aortic valve replacement is lower than mitral valve, thus INR is appropriate at the lower end of the 2-3 range.  His recommendation is to hold his anticoagulation overnight, with consultation with his primary cardiologist, neurology and neurosurgery to determine the appropriate time to restart coagulation. MDM   Final diagnoses:  None    Subdural hematoma. Post-concussion headache.    Felicie Morn, NP 03/17/15 0022  Tilden Fossa, MD 03/17/15 1257

## 2015-03-16 NOTE — ED Notes (Signed)
Updated family that we are waiting on results - notified Felicie Morn, PA of pt c/o continued HA.

## 2015-03-16 NOTE — ED Notes (Signed)
Pt requesting an update and also requesting pain medication. Onalee Hua, NP made aware

## 2015-03-16 NOTE — ED Notes (Signed)
Pt concerned about MRI d/t mechanical valve, MRI confirmed pt can have MRI.  Pt aware.

## 2015-03-16 NOTE — ED Notes (Signed)
NP at bedside.

## 2015-03-16 NOTE — ED Notes (Signed)
Patient transported to MRI 

## 2015-03-16 NOTE — ED Notes (Signed)
Pt reports he was here Tuesday and diagnosed with new seizures after a syncopal episode with fall. He was told to f/u with neurology but no one can see hiim until novemeber and he continues to have headaches. He was given pain medication which he tried for hte heaches but it only makes him sleepy but headache persists. He is A&Ox4,resp e/u

## 2015-03-16 NOTE — ED Notes (Signed)
Pt given update on admission. Requesting food and Coumadin.

## 2015-03-17 ENCOUNTER — Encounter (HOSPITAL_COMMUNITY): Payer: Self-pay | Admitting: Internal Medicine

## 2015-03-17 ENCOUNTER — Observation Stay (HOSPITAL_COMMUNITY)
Admit: 2015-03-17 | Discharge: 2015-03-17 | Disposition: A | Discharge status: Home / Self Care | Payer: BLUE CROSS/BLUE SHIELD | Attending: Internal Medicine | Admitting: Internal Medicine

## 2015-03-17 DIAGNOSIS — S065X9A Traumatic subdural hemorrhage with loss of consciousness of unspecified duration, initial encounter: Secondary | ICD-10-CM | POA: Diagnosis present

## 2015-03-17 DIAGNOSIS — G4089 Other seizures: Secondary | ICD-10-CM | POA: Diagnosis present

## 2015-03-17 DIAGNOSIS — Z7901 Long term (current) use of anticoagulants: Secondary | ICD-10-CM | POA: Diagnosis not present

## 2015-03-17 DIAGNOSIS — G4733 Obstructive sleep apnea (adult) (pediatric): Secondary | ICD-10-CM | POA: Diagnosis present

## 2015-03-17 DIAGNOSIS — R55 Syncope and collapse: Secondary | ICD-10-CM | POA: Diagnosis not present

## 2015-03-17 DIAGNOSIS — R791 Abnormal coagulation profile: Secondary | ICD-10-CM | POA: Diagnosis present

## 2015-03-17 DIAGNOSIS — R200 Anesthesia of skin: Secondary | ICD-10-CM | POA: Diagnosis not present

## 2015-03-17 DIAGNOSIS — Z6841 Body Mass Index (BMI) 40.0 and over, adult: Secondary | ICD-10-CM | POA: Diagnosis not present

## 2015-03-17 DIAGNOSIS — Z87891 Personal history of nicotine dependence: Secondary | ICD-10-CM | POA: Diagnosis not present

## 2015-03-17 DIAGNOSIS — G44309 Post-traumatic headache, unspecified, not intractable: Secondary | ICD-10-CM | POA: Diagnosis present

## 2015-03-17 DIAGNOSIS — S065XAA Traumatic subdural hemorrhage with loss of consciousness status unknown, initial encounter: Secondary | ICD-10-CM | POA: Diagnosis present

## 2015-03-17 DIAGNOSIS — R29898 Other symptoms and signs involving the musculoskeletal system: Secondary | ICD-10-CM | POA: Diagnosis not present

## 2015-03-17 DIAGNOSIS — R0789 Other chest pain: Secondary | ICD-10-CM | POA: Diagnosis not present

## 2015-03-17 DIAGNOSIS — R569 Unspecified convulsions: Secondary | ICD-10-CM

## 2015-03-17 DIAGNOSIS — Z954 Presence of other heart-valve replacement: Secondary | ICD-10-CM | POA: Diagnosis not present

## 2015-03-17 DIAGNOSIS — Z951 Presence of aortocoronary bypass graft: Secondary | ICD-10-CM | POA: Diagnosis not present

## 2015-03-17 DIAGNOSIS — I6200 Nontraumatic subdural hemorrhage, unspecified: Secondary | ICD-10-CM | POA: Diagnosis present

## 2015-03-17 DIAGNOSIS — Z952 Presence of prosthetic heart valve: Secondary | ICD-10-CM | POA: Diagnosis not present

## 2015-03-17 DIAGNOSIS — H5340 Unspecified visual field defects: Secondary | ICD-10-CM | POA: Diagnosis present

## 2015-03-17 DIAGNOSIS — E162 Hypoglycemia, unspecified: Secondary | ICD-10-CM | POA: Diagnosis present

## 2015-03-17 DIAGNOSIS — I2510 Atherosclerotic heart disease of native coronary artery without angina pectoris: Secondary | ICD-10-CM | POA: Diagnosis present

## 2015-03-17 DIAGNOSIS — Z66 Do not resuscitate: Secondary | ICD-10-CM | POA: Diagnosis present

## 2015-03-17 DIAGNOSIS — I10 Essential (primary) hypertension: Secondary | ICD-10-CM | POA: Diagnosis present

## 2015-03-17 DIAGNOSIS — E785 Hyperlipidemia, unspecified: Secondary | ICD-10-CM | POA: Diagnosis present

## 2015-03-17 LAB — GLUCOSE, CAPILLARY
Glucose-Capillary: 93 mg/dL (ref 65–99)
Glucose-Capillary: 82 mg/dL (ref 65–99)
Glucose-Capillary: 92 mg/dL (ref 65–99)

## 2015-03-17 LAB — HEPARIN LEVEL (UNFRACTIONATED): HEPARIN UNFRACTIONATED: 0.18 [IU]/mL — AB (ref 0.30–0.70)

## 2015-03-17 LAB — PROTIME-INR
INR: 1.86 — AB (ref 0.00–1.49)
PROTHROMBIN TIME: 21.3 s — AB (ref 11.6–15.2)

## 2015-03-17 MED ORDER — LIRAGLUTIDE 18 MG/3ML ~~LOC~~ SOPN: 1.2000 mg | PEN_INJECTOR | Freq: Every day | SUBCUTANEOUS | Status: DC

## 2015-03-17 MED ORDER — RESOURCE THICKENUP CLEAR PO POWD
ORAL | Status: DC | PRN
  Filled 2015-03-17: qty 125

## 2015-03-17 MED ORDER — LEVETIRACETAM 500 MG PO TABS
500.0000 mg | ORAL_TABLET | Freq: Two times a day (BID) | ORAL | Status: DC
  Administered 2015-03-17 – 2015-03-28 (×23): 500 mg via ORAL
  Filled 2015-03-17 (×23): qty 1

## 2015-03-17 MED ORDER — INSULIN ASPART 100 UNIT/ML ~~LOC~~ SOLN: 0.0000 [IU] | Freq: Three times a day (TID) | SUBCUTANEOUS | Status: DC

## 2015-03-17 MED ORDER — METOPROLOL SUCCINATE ER 25 MG PO TB24: 50.0000 mg | ORAL_TABLET | Freq: Every day | ORAL | Status: DC

## 2015-03-17 MED ORDER — ROSUVASTATIN CALCIUM 10 MG PO TABS
10.0000 mg | ORAL_TABLET | Freq: Every day | ORAL | Status: DC
  Administered 2015-03-17 – 2015-03-28 (×11): 10 mg via ORAL
  Filled 2015-03-17 (×17): qty 1

## 2015-03-17 MED ORDER — HEPARIN (PORCINE) IN NACL 100-0.45 UNIT/ML-% IJ SOLN
1600.0000 [IU]/h | INTRAMUSCULAR | Status: DC
  Administered 2015-03-17: 1650 [IU]/h via INTRAVENOUS
  Administered 2015-03-18: 1950 [IU]/h via INTRAVENOUS
  Administered 2015-03-18: 1600 [IU]/h via INTRAVENOUS
  Administered 2015-03-19 – 2015-03-22 (×5): 1500 [IU]/h via INTRAVENOUS
  Administered 2015-03-23: 1600 [IU]/h via INTRAVENOUS
  Administered 2015-03-23: 1500 [IU]/h via INTRAVENOUS
  Administered 2015-03-24 – 2015-03-28 (×7): 1600 [IU]/h via INTRAVENOUS
  Filled 2015-03-17 (×18): qty 250

## 2015-03-17 MED ORDER — ACETAMINOPHEN 500 MG PO TABS
1000.0000 mg | ORAL_TABLET | Freq: Three times a day (TID) | ORAL | Status: DC | PRN
  Administered 2015-03-17 – 2015-03-18 (×3): 1000 mg via ORAL
  Filled 2015-03-17 (×3): qty 2

## 2015-03-17 MED ORDER — METOPROLOL SUCCINATE ER 25 MG PO TB24
50.0000 mg | ORAL_TABLET | Freq: Every day | ORAL | Status: DC
  Administered 2015-03-17: 50 mg via ORAL
  Filled 2015-03-17: qty 2

## 2015-03-17 NOTE — Progress Notes (Signed)
Pts history reviewed. Apparently had SZ, fell a few days ago. Upon close review, CTH from 10/4 may also demonstrate left frontal SDH. MRI does show subacute left frontal SDH without significant mass effect, minimal MLS.  Pt does have hx of aortic valve replacement with St. Jude mechanical valve for ascending aortic dissection, currently on coumadin.  Would recommend discontinuation of coumadin for at least a few weeks, repeat CTH in 3 weeks and if this looks stable to improved can restart anticoagulation.

## 2015-03-17 NOTE — Progress Notes (Signed)
EEG completed; results pending.    

## 2015-03-17 NOTE — Progress Notes (Signed)
Pt setup on CPAP 10cm H20 w/ nasal mask. No O2 bled in. Water added to humidity chamber. Pt is resting at this time.

## 2015-03-17 NOTE — Consult Note (Addendum)
NEURO HOSPITALIST CONSULT NOTE   Referring physician: Dr Nena Alexander Reason for Consult: new onset seizure, SDH  HPI:                                                                                                                                          [name] is an 54 y.o. male, right handed, with a past medical history significant for HTN, hyperlipidemia, obesity, CAD s/p CABG, aortic valve replacement, and OSA, admitted to [facility] due to HA x 3 days after sustaining a GTC at work and MRI brain disclosing a small acute SDH over the left convexity and falx. Patient denies prior seizures in life. He was at work, standing up, when suddenly and without any warning fell to the floor and had a witnessed GTC seizure that lasted for couple of minutes and afterwards he was confused and amnestic for the episode. [name] indicated that since that seizure he has been experiencing daily right sided HA without associated nausea, vomiting, slurred speech, confusion, focal weakness/numbness, language or vision impairment. His wife is at the bedside and said that she is positive he had " a partial seizure yesterday" because he was staring off and poorly responsive for several seconds. Patient loaded with 1 gram IV keppra. MRI brain was personally reviewed and showed a small subdural hematoma over the left cerebral convexity and along the falx. 4 mm rightward midline shift. INR today pending but was sub therapeutic 1.8 on admission.   Past Medical History  Diagnosis Date  . S/P aortic valve replacement     HX of St. Jude  . Chest pain, atypical   . Hypertension     Unspecified  . Hyperlipidemia     Mixed  . Obesity   . Sleep apnea, obstructive   . Hx of CABG     Past Surgical History  Procedure Laterality Date  . Emergency median sternotomy  12/06/2004  . Extracorporeal circulation  12/06/2004  . Replacement of aortic valve  12/06/2004  . Ascending aortic dissection  aneurysm  12/06/2004    ok for MRI 1.5 or 3T Using a 27-mm St. Jude mechanical valve conduit with reimplantation of both coronary arteries  . Intraoperative transesophageal echocardiogram  12/06/2004  . Knee surgery      R  . Corneal transplant      R     Family History  Problem Relation Age of Onset  . Cervical cancer Mother   . Coronary artery disease Mother   . Diabetes Mother   . Diabetes Father   . Cervical cancer Sister     Family History: son with epilepsy. No MS, brain tumor, or brain aneurysm   Social History:  reports that he quit smoking about 25 years ago. His smoking  use included Cigarettes. He has a 1.8 pack-year smoking history. He has quit using smokeless tobacco. He reports that he does not drink alcohol or use illicit drugs.  Allergies  Allergen Reactions  . Nsaids Other (See Comments)    Told not to take meds-per patient    MEDICATIONS:                                                                                                                     Scheduled: . metoprolol succinate  50 mg Oral Daily  . rosuvastatin  10 mg Oral Daily     ROS:                                                                                                                                       History obtained from chart review and the patient  General ROS: negative for - chills, fatigue, fever, night sweats, or weight loss Psychological ROS: negative for - behavioral disorder, hallucinations, memory difficulties, mood swings or suicidal ideation Ophthalmic ROS: negative for - blurry vision, double vision, eye pain or loss of vision ENT ROS: negative for - epistaxis, nasal discharge, oral lesions, sore throat, tinnitus or vertigo Allergy and Immunology ROS: negative for - hives or itchy/watery eyes Hematological and Lymphatic ROS: negative for - bleeding problems, bruising or swollen lymph nodes Endocrine ROS: negative for - galactorrhea, hair pattern changes,  polydipsia/polyuria or temperature intolerance Respiratory ROS: negative for - cough, hemoptysis, shortness of breath or wheezing Cardiovascular ROS: negative for - chest pain, dyspnea on exertion, edema or irregular heartbeat Gastrointestinal ROS: negative for - abdominal pain, diarrhea, hematemesis, nausea/vomiting or stool incontinence Genito-Urinary ROS: negative for - dysuria, hematuria, incontinence or urinary frequency/urgency Musculoskeletal ROS: negative for - joint swelling or muscular weakness Neurological ROS: as noted in HPI Dermatological ROS: negative for rash and skin lesion changes    Physical exam:  Constitutional: well developed, pleasant male in no apparent distress. Blood pressure 120/62, pulse 48, temperature 97.5 F (36.4 C), temperature source Oral, resp. rate 18, height 6' 2" (1.88 m), weight 145.605 kg (321 lb), SpO2 96 %. Eyes: no jaundice or exophthalmos.  Head: normocephalic. Neck: supple, no bruits, no JVD. Cardiac: no murmurs. Lungs: clear. Abdomen: soft, no tender, no mass. Extremities: no edema, clubbing, or cyanosis.  Skin: no rash  Neurologic Examination:  General: NAD Mental Status: Alert, oriented, thought content appropriate.  Speech fluent without evidence of aphasia.  Able to follow 3 step commands without difficulty. Cranial Nerves: II: Discs flat bilaterally; Visual fields grossly normal, pupils equal, round, reactive to light and accommodation III,IV, VI: ptosis not present, extra-ocular motions intact bilaterally V,VII: smile symmetric, facial light touch sensation normal bilaterally VIII: hearing normal bilaterally IX,X: uvula rises symmetrically XI: bilateral shoulder shrug XII: midline tongue extension without atrophy or fasciculations  Motor: Right : Upper extremity   5/5    Left:     Upper extremity   5/5  Lower extremity    5/5     Lower extremity   5/5 Tone and bulk:normal tone throughout; no atrophy noted Sensory: Pinprick and light touch intact throughout, bilaterally Deep Tendon Reflexes:  Right: Upper Extremity   Left: Upper extremity   biceps (C-5 to C-6) 2/4   biceps (C-5 to C-6) 2/4 tricep (C7) 2/4    triceps (C7) 2/4 Brachioradialis (C6) 2/4  Brachioradialis (C6) 2/4  Lower Extremity Lower Extremity  quadriceps (L-2 to L-4) 2/4   quadriceps (L-2 to L-4) 2/4 Achilles (S1) 2/4   Achilles (S1) 2/4  Plantars: Right: downgoing   Left: downgoing Cerebellar: normal finger-to-nose,  normal heel-to-shin test Gait:  No tested due to multiple leads    No results found for: CHOL  Results for orders placed or performed during the hospital encounter of 03/16/15 (from the past 48 hour(s))  Protime-INR     Status: Abnormal   Collection Time: 03/16/15  6:25 PM  Result Value Ref Range   Prothrombin Time 21.1 (H) 11.6 - 15.2 seconds   INR 1.83 (H) 0.00 - 1.49  CBC with Differential/Platelet     Status: Abnormal   Collection Time: 03/16/15  6:25 PM  Result Value Ref Range   WBC 6.8 4.0 - 10.5 K/uL   RBC 4.31 4.22 - 5.81 MIL/uL   Hemoglobin 12.3 (L) 13.0 - 17.0 g/dL   HCT 39.6 39.0 - 52.0 %   MCV 91.9 78.0 - 100.0 fL   MCH 28.5 26.0 - 34.0 pg   MCHC 31.1 30.0 - 36.0 g/dL   RDW 14.2 11.5 - 15.5 %   Platelets 197 150 - 400 K/uL   Neutrophils Relative % 61 %   Neutro Abs 4.2 1.7 - 7.7 K/uL   Lymphocytes Relative 22 %   Lymphs Abs 1.5 0.7 - 4.0 K/uL   Monocytes Relative 14 %   Monocytes Absolute 1.0 0.1 - 1.0 K/uL   Eosinophils Relative 2 %   Eosinophils Absolute 0.1 0.0 - 0.7 K/uL   Basophils Relative 1 %   Basophils Absolute 0.0 0.0 - 0.1 K/uL  Basic metabolic panel     Status: Abnormal   Collection Time: 03/16/15  6:25 PM  Result Value Ref Range   Sodium 138 135 - 145 mmol/L   Potassium 3.8 3.5 - 5.1 mmol/L   Chloride 103 101 - 111 mmol/L   CO2 28 22 - 32 mmol/L   Glucose, Bld 85 65 - 99  mg/dL   BUN 9 6 - 20 mg/dL   Creatinine, Ser 0.85 0.61 - 1.24 mg/dL   Calcium 8.8 (L) 8.9 - 10.3 mg/dL   GFR calc non Af Amer >60 >60 mL/min   GFR calc Af Amer >60 >60 mL/min    Comment: (NOTE) The eGFR has been calculated using the CKD EPI equation. This calculation has not been validated in all clinical situations. eGFR's persistently <  60 mL/min signify possible Chronic Kidney Disease.    Anion gap 7 5 - 15  Glucose, capillary     Status: None   Collection Time: 03/17/15  6:22 AM  Result Value Ref Range   Glucose-Capillary 93 65 - 99 mg/dL   Comment 1 Notify RN    Comment 2 Document in Chart     Mr Brain Wo Contrast  03/16/2015   CLINICAL DATA:  Newly diagnosed seizures after a syncopal episode with fall. Headache. Visual field defect.  EXAM: MRI HEAD WITHOUT CONTRAST  TECHNIQUE: Multiplanar, multiecho pulse sequences of the brain and surrounding structures were obtained without intravenous contrast.  COMPARISON:  Head CT in 03/13/2015  FINDINGS: There is a small volume, likely subacute subdural hematoma over the left cerebral convexity, largest in the frontal region where it measures up to 6 mm in thickness. There is mild left cerebral hemispheric sulcal effacement, and there is 4 mm of rightward midline shift. A small amount of subdural hemorrhage is also present in the interhemispheric fissure along the left aspect of the falx. There is no hydrocephalus. No acute infarct, parenchymal hemorrhage, or mass is seen.  The hippocampi are grossly symmetric in size and signal, however detailed evaluation is precluded by prominent motion artifact on thin section images through the temporal lobes. No significant white matter disease is seen. Cerebral volume is normal for age.  Prior right cataract extraction is noted. There is mild left maxillary sinus mucosal thickening. Mastoid air cells are clear. Major intracranial vascular flow voids are preserved.  IMPRESSION: Small subdural hematoma over the  left cerebral convexity and along the falx. 4 mm rightward midline shift.  Critical Value/emergent results were called by telephone at the time of interpretation on 03/16/2015 at 9:17 pm to Dr. Ralene Bathe, who verbally acknowledged these results.   Electronically Signed   By: Logan Bores M.D.   On: 03/16/2015 21:21   Assessment/Plan: 54 y/o with new onset isolated symptomatic GTC seizure. Non focal neuro-exam. MRI brain reveals a small acute left convexity SDH with 4 mm shift. EEG just completed, will review. Recommend; Continue keppra 500 mg BID. Hold coumadin. Will ask for stroke team opinion regarding timing of re initiating anticoagulation in this patient with a mechanical valve with acute SDH but certainly at high risk of ischemic stroke off anticoagulation. Discussed at length with patient and wife.  Dorian Pod, MD 03/17/2015, 9:13 AM  [facility]  Addendum: I had a long conversation with patient, wife, and other family members present in the room in regards to anticoagulation in the setting of mechanical valve and an acute subdural hematoma. I conveyed to them my concern rgarding the fact that patient is at significant risk of having a disabling ischemic stroke from being off coumadin for 3 weeks due to his SDH, but at the same time they need to know that utilizing IV heparin to confer stroke protection in this scenario also poses a risk for worsening of the left SDH. This is a significant dilemma, but in this circumstance I quite frankly favor utilizing low dose heparin without a bolus, with follow up CT brain and close neurological surveillance. Patient and family told me that they will think about it and let me know their final decision today.  Dorian Pod, MD

## 2015-03-17 NOTE — Progress Notes (Addendum)
PATIENT DETAILS Name: [name] Age: 54 y.o. Sex: male Date of Birth: Nov 18, 1960 Admit Date: 03/16/2015 Admitting Physician Hillary Bow, DO ZOX:[id number] N, MD  Subjective: Continues to have headaches. Patient had a "syncopal spell"on 10/4 while at work-without any prodromal symptoms. However had some seizure-like activity noted.  Assessment/Plan: Principal Problem: SDH (subdural hematoma): Suspect secondary to syncope/seizures- on Coumadin. Recommendations from neurosurgery and neurology are to hold Coumadin for now-neurosurgery recommending to hold Coumadin until a repeat CT head can be done in 3 weeks. Nonfocal exam   Active Problems: Syncope: Sustained on 10/4-without any prodromal symptoms. Patient was at work doing inventory-next thing he remembers is being surrounded by EMS. Per spouse Coworkers described seizure-like activity. Since MRI brain shows subdural hematoma-which could certainly be a foci for seizures-continue Keppra. Furthermore-on Victoza-Claims mostly for weight loss and denies history of diabetes-not sure whether hypoglycemia was playing any role. Also telemetry shows intermittent pauses with a lot of PVCs-not sure whether this is a junctional rhythm/AV block. Will discontinue beta blocker for now and have consulted cardiology (spoke with Dr Wyline Mood).Repeat 12 lead EKG today  S/P aortic valve replacement: With mechanical valve. On long-term Coumadin treatment-currently on hold-see above.  Headache:? Postconcussion headache. Avoid NSAIDs-as INR still 1.8-Will try and manage with acetaminophen.  Morbid obesity: Discontinue Victoza-further workup/treatment deferred to the outpatient setting  Dyslipidemia: Continue statin  Disposition: Remain inpatient-await input from Cards/Neurosurgery  Antimicrobial agents  See below  Anti-infectives    None      DVT Prophylaxis:  SCD's  Code Status:  DNR  Family Communication Spouse  at bedside  Procedures: None  CONSULTS:  cardiology, neurology and Neurosurgery  Time spent 30 minutes-Greater than 50% of this time was spent in counseling, explanation of diagnosis, planning of further management, and coordination of care.  MEDICATIONS: Scheduled Meds: . levETIRAcetam  500 mg Oral BID  . rosuvastatin  10 mg Oral Daily   Continuous Infusions:  PRN Meds:.acetaminophen    PHYSICAL EXAM: Vital signs in last 24 hours: Filed Vitals:   03/17/15 0400 03/17/15 0539 03/17/15 1000 03/17/15 1031  BP: 122/68 120/62 121/57 118/55  Pulse: 48 48 34 52  Temp: 97.5 F (36.4 C) 97.5 F (36.4 C) 98.2 F (36.8 C) 97.5 F (36.4 C)  TempSrc: Oral Oral Oral Oral  Resp: Height:      Weight:      SpO2: 96% 96% 98% 98%    Weight change:  Filed Weights   03/16/15 1536  Weight: 145.605 kg (321 lb)   Body mass index is 41.2 kg/(m^2).   Gen Exam: Awake and alert with clear speech. Neck: Supple, No JVD.   Chest: B/L Clear.   CVS: S1 S2 Regular, no murmurs.  Abdomen: soft, BS +, non tender, non distended.  Extremities: no edema, lower extremities warm to touch. Neurologic: Non Focal.   Skin: No Rash.   Wounds: N/A.   Intake/Output from previous day: No intake or output data in the 24 hours ending 03/17/15 1133   LAB RESULTS: CBC  Recent Labs Lab 03/13/15 1743 03/16/15 1825  WBC 7.8 6.8  HGB 12.5* 12.3*  HCT 39.8 39.6  PLT 189 197  MCV 90.7 91.9  MCH 28.5 28.5  MCHC 31.4 31.1  RDW 13.9 14.2  LYMPHSABS  --  1.5  MONOABS  --  1.0  EOSABS  --  0.1  BASOSABS  --  0.0    Chemistries   Recent Labs Lab 03/13/15 1743 03/16/15 1825  NA 140 138  K 3.5 3.8  CL 105 103  CO2 24 28  GLUCOSE 79 85  BUN 9 9  CREATININE 0.77 0.85  CALCIUM 9.0 8.8*    CBG:  Recent Labs Lab 03/13/15 2056 03/17/15 0622  GLUCAP 91 93    GFR Estimated Creatinine Clearance: 147.7 mL/min (by C-G formula based on Cr of 0.85).  Coagulation  profile  Recent Labs Lab 03/13/15 2051 03/16/15 1825 03/17/15 0835  INR 1.78* 1.83* 1.86*    Cardiac Enzymes No results for input(s): CKMB, TROPONINI, MYOGLOBIN in the last 168 hours.  Invalid input(s): CK  Invalid input(s): POCBNP No results for input(s): DDIMER in the last 72 hours. No results for input(s): HGBA1C in the last 72 hours. No results for input(s): CHOL, HDL, LDLCALC, TRIG, CHOLHDL, LDLDIRECT in the last 72 hours. No results for input(s): TSH, T4TOTAL, T3FREE, THYROIDAB in the last 72 hours.  Invalid input(s): FREET3 No results for input(s): VITAMINB12, FOLATE, FERRITIN, TIBC, IRON, RETICCTPCT in the last 72 hours. No results for input(s): LIPASE, AMYLASE in the last 72 hours.  Urine Studies No results for input(s): UHGB, CRYS in the last 72 hours.  Invalid input(s): UACOL, UAPR, USPG, UPH, UTP, UGL, UKET, UBIL, UNIT, UROB, ULEU, UEPI, UWBC, URBC, UBAC, CAST, UCOM, BILUA  MICROBIOLOGY: No results found for this or any previous visit (from the past 240 hour(s)).  RADIOLOGY STUDIES/RESULTS: Ct Head Wo Contrast  03/13/2015   CLINICAL DATA:  Head and neck pain.  EXAM: CT HEAD WITHOUT CONTRAST  CT CERVICAL SPINE WITHOUT CONTRAST  TECHNIQUE: Multidetector CT imaging of the head and cervical spine was performed following the standard protocol without intravenous contrast. Multiplanar CT image reconstructions of the cervical spine were also generated.  COMPARISON:  CT scan of head of May 30, 2014.  FINDINGS: CT HEAD FINDINGS  Mild right frontal scalp hematoma is noted. Bony calvarium appears intact. No mass effect or midline shift is noted. Ventricular size is within normal limits. There is no evidence of mass lesion, hemorrhage or acute infarction.  CT CERVICAL SPINE FINDINGS  No fracture or spondylolisthesis is noted. Moderate degenerative disc disease is noted at C5-6 and C6-7 with anterior osteophyte formation. Posterior facet joints appear intact. Visualized lung  apices appear normal.  IMPRESSION: Mild right frontal scalp hematoma. No acute intracranial abnormality seen.  Moderate degenerative disc disease is noted at C5-6 and C6-7. No acute abnormality seen in the cervical spine.   Electronically Signed   By: Lupita Raider, M.D.   On: 03/13/2015 18:18   Ct Cervical Spine Wo Contrast  03/13/2015   CLINICAL DATA:  Head and neck pain.  EXAM: CT HEAD WITHOUT CONTRAST  CT CERVICAL SPINE WITHOUT CONTRAST  TECHNIQUE: Multidetector CT imaging of the head and cervical spine was performed following the standard protocol without intravenous contrast. Multiplanar CT image reconstructions of the cervical spine were also generated.  COMPARISON:  CT scan of head of May 30, 2014.  FINDINGS: CT HEAD FINDINGS  Mild right frontal scalp hematoma is noted. Bony calvarium appears intact. No mass effect or midline shift is noted. Ventricular size is within normal limits. There is no evidence of mass lesion, hemorrhage or acute infarction.  CT CERVICAL SPINE FINDINGS  No fracture or spondylolisthesis is noted. Moderate degenerative disc disease is noted at C5-6 and C6-7 with anterior osteophyte formation. Posterior facet joints appear intact. Visualized lung apices appear  normal.  IMPRESSION: Mild right frontal scalp hematoma. No acute intracranial abnormality seen.  Moderate degenerative disc disease is noted at C5-6 and C6-7. No acute abnormality seen in the cervical spine.   Electronically Signed   By: Lupita Raider, M.D.   On: 03/13/2015 18:18   Mr Brain Wo Contrast  03/16/2015   CLINICAL DATA:  Newly diagnosed seizures after a syncopal episode with fall. Headache. Visual field defect.  EXAM: MRI HEAD WITHOUT CONTRAST  TECHNIQUE: Multiplanar, multiecho pulse sequences of the brain and surrounding structures were obtained without intravenous contrast.  COMPARISON:  Head CT in 03/13/2015  FINDINGS: There is a small volume, likely subacute subdural hematoma over the left cerebral  convexity, largest in the frontal region where it measures up to 6 mm in thickness. There is mild left cerebral hemispheric sulcal effacement, and there is 4 mm of rightward midline shift. A small amount of subdural hemorrhage is also present in the interhemispheric fissure along the left aspect of the falx. There is no hydrocephalus. No acute infarct, parenchymal hemorrhage, or mass is seen.  The hippocampi are grossly symmetric in size and signal, however detailed evaluation is precluded by prominent motion artifact on thin section images through the temporal lobes. No significant white matter disease is seen. Cerebral volume is normal for age.  Prior right cataract extraction is noted. There is mild left maxillary sinus mucosal thickening. Mastoid air cells are clear. Major intracranial vascular flow voids are preserved.  IMPRESSION: Small subdural hematoma over the left cerebral convexity and along the falx. 4 mm rightward midline shift.  Critical Value/emergent results were called by telephone at the time of interpretation on 03/16/2015 at 9:17 pm to Dr. Madilyn Hook, who verbally acknowledged these results.   Electronically Signed   By: Sebastian Ache M.D.   On: 03/16/2015 21:21    Jeoffrey Massed, MD  [facility] Pager:[phone]  If 7PM-7AM, please contact night-coverage [email or web address] Password [email or web address] 03/17/2015, 11:33 AM

## 2015-03-17 NOTE — Progress Notes (Addendum)
ANTICOAGULATION CONSULT NOTE - Initial Consult  Pharmacy Consult for heparin Indication: mechanical AVR  Allergies  Allergen Reactions  . Nsaids Other (See Comments)    Told not to take meds-per patient    Patient Measurements: Height:  (188 cm) Weight: (!) 321 lb (145.605 kg) IBW/kg (Calculated) : 82.2 Heparin Dosing Weight: 116 kg  Vital Signs: Temp: 97.5 F (36.4 C) (10/08 1031) Temp Source: Oral (10/08 1031) BP: 118/55 mmHg (10/08 1031) Pulse Rate: 52 (10/08 1031)  Labs:  Recent Labs  03/16/15 1825 03/17/15 0835  HGB 12.3*  --   HCT 39.6  --   PLT 197  --   LABPROT 21.1* 21.3*  INR 1.83* 1.86*  CREATININE 0.85  --     Estimated Creatinine Clearance: 147.7 mL/min (by C-G formula based on Cr of 0.85).   Medical History: Past Medical History  Diagnosis Date  . S/P aortic valve replacement     HX of St. Jude  . Chest pain, atypical   . Hypertension     Unspecified  . Hyperlipidemia     Mixed  . Obesity   . Sleep apnea, obstructive   . Hx of CABG     Medications:  Prescriptions prior to admission  Medication Sig Dispense Refill Last Dose  . aspirin 81 MG EC tablet Take 81 mg by mouth daily.     03/16/2015 at Unknown time  . cholecalciferol (VITAMIN D) 1000 UNITS tablet Take 2,000 Units by mouth 2 (two) times daily.    03/16/2015 at Unknown time  . Ixekizumab (TALTZ [place]) Inject 1 application into the skin every 30 (thirty) days.   Past Month at Unknown time  . Liraglutide (VICTOZA [place]) Inject 1.2 mg into the skin daily.    03/16/2015 at Unknown time  . metoprolol succinate (TOPROL-XL) 50 MG 24 hr tablet Take 50 mg by mouth daily.    03/15/2015 at 2130  . Multiple Vitamin (MULTIVITAMIN WITH MINERALS) TABS tablet Take 1 tablet by mouth daily.   03/16/2015 at Unknown time  . rosuvastatin (CRESTOR) 10 MG tablet Take 10 mg by mouth daily.    03/15/2015 at Unknown time  . traMADol (ULTRAM) 50 MG tablet Take 1 tablet (50 mg total) by mouth every 6 (six) hours as  needed. (Patient taking differently: Take 50 mg by mouth every 6 (six) hours as needed for moderate pain. ) 20 tablet 0 03/16/2015 at Unknown time  . warfarin (COUMADIN) 7.5 MG tablet TAKE AS DIRECTED BY ANTICOAGULATION CLINIC (Patient taking differently: Take 3.75-7.5 mg by mouth daily at 6 PM. Takes 3.75mg  on Fri only Takes 7.5mg  all other days) 35 tablet 3 03/15/2015 at Unknown time    Assessment: 54 y/o male on chronic warfarin for mechanical AVR with seizure activity and fall on 10/4. CT head revealed subacute SDH. Neurosurgery recommending to hold warfarin until repeat CT head in 3 weeks however neurology concerned for risk of stroke being off warfarin for that long with mechanical AVR. Neurology discussed risk/benefit with patient and wife. Pharmacy consulted to begin IV heparin with NO BOLUS and with lower goal range. No other bleeding noted, CBC is stable. INR is 1.86.  Warfarin PTA: 7.5 mg daily EXCEPT 3.75 mg Fri  Goal of Therapy:  Heparin level 0.3-0.5 units/ml Monitor platelets by anticoagulation protocol: Yes   Plan:  - No heparin boluses with SDH - Begin heparin drip at 1650 units/hr IV - 6 hr heparin level - Daily heparin level and CBC - Monitor for s/sx  of bleeding  [facility], [address].D., BCPS Clinical Pharmacist Pager: [phone] 03/17/2015 2:28 PM   Addendum: Heparin level is SUBtherapeutic at 0.18 on 1650 units/hr. No bleeding noted.  Increase heparin drip to 1950 units/hr 6 he heparin level  [place], [address].D., BCPS Clinical Pharmacist Pager: [phone] 03/17/2015 9:39 PM

## 2015-03-17 NOTE — Procedures (Signed)
EEG report.  Brief clinical history:  54 y/o with new onset isolated symptomatic post traumatic GTC seizure. Non focal neuro-exam. MRI brain reveals a small acute left convexity SDH with 4 mm shift.  Technique: this is a 17 channel routine scalp EEG performed at the bedside with bipolar and monopolar montages arranged in accordance to the international 10/20 system of electrode placement. One channel was dedicated to EKG recording.  No sleep was recorded. No activating procedures performed.  Description:In the wakeful state, the best background consisted of a medium amplitude, posterior dominant, well sustained, symmetric and reactive 11 Hz rhythm. No focal or generalized epileptiform discharges noted.  No pathologic areas of slowing seen.  EKG showed sinus rhythm.  Impression: this is a normal awake EEG. Please, be aware that a normal EEG does not exclude the possibility of epilepsy.  Clinical correlation is advised.   Wyatt Portela, MD [facility]

## 2015-03-17 NOTE — H&P (Signed)
[facility] History and Physical  [name] ZOX:[id number] DOB: 1960-06-16 DOA: 03/16/2015  Referring physician: EDP PCP: Gwynneth Aliment, MD   Chief Complaint: Headache   HPI: [name] is a 54 y.o. male with h/o AVR with mechanical valve on chronic coumadin.  Patient presents to ED with 3 day history of headaches after a seizure with associated head injury.  CT scan at the time was read as normal (though Dr. Conchita Paris says he now sees what he belives to be a SDH on that CT).  Patient comes in to ED today where MRI shows sub-acute SDH.  His INR is subtheraputic at 1.8.  Review of Systems: Systems reviewed.  As above, otherwise negative  Past Medical History  Diagnosis Date  . S/P aortic valve replacement     HX of St. Jude  . Chest pain, atypical   . Hypertension     Unspecified  . Hyperlipidemia     Mixed  . Obesity   . Sleep apnea, obstructive   . Hx of CABG    Past Surgical History  Procedure Laterality Date  . Emergency median sternotomy  12/06/2004  . Extracorporeal circulation  12/06/2004  . Replacement of aortic valve  12/06/2004  . Ascending aortic dissection aneurysm  12/06/2004    ok for MRI 1.5 or 3T Using a 27-mm St. Jude mechanical valve conduit with reimplantation of both coronary arteries  . Intraoperative transesophageal echocardiogram  12/06/2004  . Knee surgery      R  . Corneal transplant      R    Social History:  reports that he quit smoking about 25 years ago. His smoking use included Cigarettes. He has a 1.8 pack-year smoking history. He has quit using smokeless tobacco. He reports that he does not drink alcohol or use illicit drugs.  Allergies  Allergen Reactions  . Nsaids Other (See Comments)    Told not to take meds-per patient    Family History  Problem Relation Age of Onset  . Cervical cancer Mother   . Coronary artery disease Mother   . Diabetes Mother   . Diabetes Father   . Cervical cancer Sister      Prior  to Admission medications   Medication Sig Start Date End Date Taking? Authorizing Provider  aspirin 81 MG EC tablet Take 81 mg by mouth daily.     Yes Historical Provider, MD  cholecalciferol (VITAMIN D) 1000 UNITS tablet Take 2,000 Units by mouth 2 (two) times daily.    Yes Historical Provider, MD  Ixekizumab (TALTZ [place]) Inject 1 application into the skin every 30 (thirty) days.   Yes Historical Provider, MD  Liraglutide (VICTOZA [place]) Inject 1.2 mg into the skin daily.    Yes Historical Provider, MD  metoprolol succinate (TOPROL-XL) 50 MG 24 hr tablet Take 50 mg by mouth daily.  08/29/13  Yes Historical Provider, MD  Multiple Vitamin (MULTIVITAMIN WITH MINERALS) TABS tablet Take 1 tablet by mouth daily.   Yes Historical Provider, MD  rosuvastatin (CRESTOR) 10 MG tablet Take 10 mg by mouth daily.    Yes Historical Provider, MD  traMADol (ULTRAM) 50 MG tablet Take 1 tablet (50 mg total) by mouth every 6 (six) hours as needed. Patient taking differently: Take 50 mg by mouth every 6 (six) hours as needed for moderate pain.  05/25/14  Yes Cathren Laine, MD  warfarin (COUMADIN) 7.5 MG tablet TAKE AS DIRECTED BY ANTICOAGULATION CLINIC Patient taking differently: Take 3.75-7.5 mg by mouth  daily at 6 PM. Takes 3.75mg  on Fri only Takes 7.5mg  all other days 02/16/15  Yes Tonny Bollman, MD   Physical Exam: Filed Vitals:   03/16/15 2245  BP: 140/99  Pulse: 105  Temp:   Resp:     BP 140/99 mmHg  Pulse 105  Temp(Src) 98.2 F (36.8 C) (Oral)  Resp 16  Ht 6\' 2"  (1.88 m)  Wt 145.605 kg (321 lb)  BMI 41.20 kg/m2  SpO2 100%  General Appearance:    Alert, oriented, no distress, appears stated age  Head:    Normocephalic, atraumatic  Eyes:    PERRL, EOMI, sclera non-icteric        Nose:   Nares without drainage or epistaxis. Mucosa, turbinates normal  Throat:   Moist mucous membranes. Oropharynx without erythema or exudate.  Neck:   Supple. No carotid bruits.  No thyromegaly.  No lymphadenopathy.    Back:     No CVA tenderness, no spinal tenderness  Lungs:     Clear to auscultation bilaterally, without wheezes, rhonchi or rales  Chest wall:    No tenderness to palpitation  Heart:    Regular rate and rhythm without murmurs, gallops, rubs  Abdomen:     Soft, non-tender, nondistended, normal bowel sounds, no organomegaly  Genitalia:    deferred  Rectal:    deferred  Extremities:   No clubbing, cyanosis or edema.  Pulses:   2+ and symmetric all extremities  Skin:   Skin color, texture, turgor normal, no rashes or lesions  Lymph nodes:   Cervical, supraclavicular, and axillary nodes normal  Neurologic:   CNII-XII intact. Normal strength, sensation and reflexes      throughout    Labs on Admission:  Basic Metabolic Panel:  Recent Labs Lab 03/13/15 1743 03/16/15 1825  NA 140 138  K 3.5 3.8  CL 105 103  CO2 24 28  GLUCOSE 79 85  BUN 9 9  CREATININE 0.77 0.85  CALCIUM 9.0 8.8*   Liver Function Tests: No results for input(s): AST, ALT, ALKPHOS, BILITOT, PROT, ALBUMIN in the last 168 hours. No results for input(s): LIPASE, AMYLASE in the last 168 hours. No results for input(s): AMMONIA in the last 168 hours. CBC:  Recent Labs Lab 03/13/15 1743 03/16/15 1825  WBC 7.8 6.8  NEUTROABS  --  4.2  HGB 12.5* 12.3*  HCT 39.8 39.6  MCV 90.7 91.9  PLT 189 197   Cardiac Enzymes: No results for input(s): CKTOTAL, CKMB, CKMBINDEX, TROPONINI in the last 168 hours.  BNP (last 3 results)  Recent Labs  04/21/14 1437  PROBNP 40.3   CBG:  Recent Labs Lab 03/13/15 2056  GLUCAP 91    Radiological Exams on Admission: Mr Brain Wo Contrast  03/16/2015   CLINICAL DATA:  Newly diagnosed seizures after a syncopal episode with fall. Headache. Visual field defect.  EXAM: MRI HEAD WITHOUT CONTRAST  TECHNIQUE: Multiplanar, multiecho pulse sequences of the brain and surrounding structures were obtained without intravenous contrast.  COMPARISON:  Head CT in 03/13/2015  FINDINGS: There  is a small volume, likely subacute subdural hematoma over the left cerebral convexity, largest in the frontal region where it measures up to 6 mm in thickness. There is mild left cerebral hemispheric sulcal effacement, and there is 4 mm of rightward midline shift. A small amount of subdural hemorrhage is also present in the interhemispheric fissure along the left aspect of the falx. There is no hydrocephalus. No acute infarct, parenchymal hemorrhage, or mass is  seen.  The hippocampi are grossly symmetric in size and signal, however detailed evaluation is precluded by prominent motion artifact on thin section images through the temporal lobes. No significant white matter disease is seen. Cerebral volume is normal for age.  Prior right cataract extraction is noted. There is mild left maxillary sinus mucosal thickening. Mastoid air cells are clear. Major intracranial vascular flow voids are preserved.  IMPRESSION: Small subdural hematoma over the left cerebral convexity and along the falx. 4 mm rightward midline shift.  Critical Value/emergent results were called by telephone at the time of interpretation on 03/16/2015 at 9:17 pm to Dr. Madilyn Hook, who verbally acknowledged these results.   Electronically Signed   By: Sebastian Ache M.D.   On: 03/16/2015 21:21    EKG: Independently reviewed.  Assessment/Plan Principal Problem:   SDH (subdural hematoma) (HCC) Active Problems:   S/P aortic valve replacement   Long term current use of anticoagulant   1. SDH - 1. Per cardiology Dr. Sherren Kerns: okay to hold coumadin / ASA for now 2. Need to talk to neurology / NS in AM to find out when this can be restarted. 3. SDH is small and will not likely require surgical intervention per Dr. Conchita Paris 4. Keeping patient on keppra  BID per Dr. Roseanne Reno 2. S/P aortic valve replacement - holding anticoagulation as above, there is increased risk of stroke with this so will need to resume as soon as reasonably safe to do  so.    Code Status: Full Code  Family Communication: Wife at bedside Disposition Plan: admit to obs   Time spent: 50 min  Krystal Delduca M. [facility] Pager [phone]  If 7AM-7PM, please contact the day team taking care of the patient [email or web address] Password [email or web address] 03/17/2015, 12:15 AM

## 2015-03-17 NOTE — Progress Notes (Signed)
Patient admitted to 5M10 alert and oriented.  No complaints at this time. Wife at bedside.

## 2015-03-17 NOTE — ED Notes (Signed)
Dr. Gardner at bedside 

## 2015-03-17 NOTE — ED Notes (Signed)
Pt given turkey sandwich and apple juice

## 2015-03-17 NOTE — Consult Note (Signed)
ELECTROPHYSIOLOGY CONSULT NOTE  Patient ID: [name], MRN: [id number], DOB/AGE: 56-17-1962 56 y.o. Admit date: 03/16/2015 Date of Consult: 03/17/2015  Primary Physician: Gwynneth Aliment, MD Primary Cardiologist: [facility]  Chief Complaint: syncope   HPI [name] is a 54 y.o. male  seen because of an episode of loss of consciousness.  The history is obtained from the chartt as well as the patient. The patient describes the event as having been with his colleague. His colleague turned away and turned back and saw that he was standing and jerking with his eyes glazed. He fell to the ground as the colleague ran out to call 911. I further told that blood sugar assessment showed his glucose to be 40. Details regarding intervention are not available. There are comments attributed to his wife wherein he was staring off suggesting a partial complex seizure occurring a day or so before.  He has no history of syncope. He has no history of heart rhythm abnormalities.  He has a history of ascending aortic aneurysm with a type I dissection for which he underwent emergency valve conduit and reimplantation 2006.  As best as he knows no cause the aortic dissection was ever uncovered. He does not recall testing  He is unaware of family history of aortic root dissection.     Past Medical History  Diagnosis Date  . S/P aortic valve replacement     HX of St. Jude  . Chest pain, atypical   . Hypertension     Unspecified  . Hyperlipidemia     Mixed  . Obesity   . Sleep apnea, obstructive   . Hx of CABG       Surgical History:  Past Surgical History  Procedure Laterality Date  . Emergency median sternotomy  12/06/2004  . Extracorporeal circulation  12/06/2004  . Replacement of aortic valve  12/06/2004  . Ascending aortic dissection aneurysm  12/06/2004    ok for MRI 1.5 or 3T Using a 27-mm St. Jude mechanical valve conduit with reimplantation of both coronary arteries  .  Intraoperative transesophageal echocardiogram  12/06/2004  . Knee surgery      R  . Corneal transplant      R      Home Meds: Prior to Admission medications   Medication Sig Start Date End Date Taking? Authorizing Provider  aspirin 81 MG EC tablet Take 81 mg by mouth daily.     Yes Historical Provider, MD  cholecalciferol (VITAMIN D) 1000 UNITS tablet Take 2,000 Units by mouth 2 (two) times daily.    Yes Historical Provider, MD  Ixekizumab (TALTZ [place]) Inject 1 application into the skin every 30 (thirty) days.   Yes Historical Provider, MD  Liraglutide (VICTOZA [place]) Inject 1.2 mg into the skin daily.    Yes Historical Provider, MD  metoprolol succinate (TOPROL-XL) 50 MG 24 hr tablet Take 50 mg by mouth daily.  08/29/13  Yes Historical Provider, MD  Multiple Vitamin (MULTIVITAMIN WITH MINERALS) TABS tablet Take 1 tablet by mouth daily.   Yes Historical Provider, MD  rosuvastatin (CRESTOR) 10 MG tablet Take 10 mg by mouth daily.    Yes Historical Provider, MD  traMADol (ULTRAM) 50 MG tablet Take 1 tablet (50 mg total) by mouth every 6 (six) hours as needed. Patient taking differently: Take 50 mg by mouth every 6 (six) hours as needed for moderate pain.  05/25/14  Yes Cathren Laine, MD  warfarin (COUMADIN) 7.5 MG tablet TAKE AS DIRECTED  BY ANTICOAGULATION CLINIC Patient taking differently: Take 3.75-7.5 mg by mouth daily at 6 PM. Takes 3.75mg  on Fri only Takes 7.5mg  all other days 02/16/15  Yes Tonny Bollman, MD    Inpatient Medications:  . levETIRAcetam  500 mg Oral BID  . rosuvastatin  10 mg Oral Daily      Allergies:  Allergies  Allergen Reactions  . Nsaids Other (See Comments)    Told not to take meds-per patient    Social History   Social History  . Marital Status: Married    Spouse Name: N/A  . Number of Children: 2  . Years of Education: N/A   Occupational History  . 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 Smoking  status: Former Smoker -- 0.10 packs/day for 18 years    Types: Cigarettes    Quit date: 12/07/1989  . Smokeless tobacco: Former Neurosurgeon  . Alcohol Use: No     Comment: Quit 1991  . Drug Use: No  . Sexual Activity: Not on file   Other Topics Concern  . Not on file   Social History Narrative   Married   No regular exercise     Family History  Problem Relation Age of Onset  . Cervical cancer Mother   . Coronary artery disease Mother   . Diabetes Mother   . Diabetes Father   . Cervical cancer Sister      ROS:  Please see the history of present illness.     All other systems reviewed and negative.    Physical Exam:   Blood pressure 109/61, pulse 58, temperature 97 F (36.1 C), temperature source Oral, resp. rate 18, height 6\' 2"  (1.88 m), weight 321 lb (145.605 kg), SpO2 98 %. General: Well developed, well nourished male in no acute distress. Head: Normocephalic, atraumatic, sclera non-icteric, no xanthomas, nares are without discharge. EENT: normal Lymph Nodes:  none Back: without scoliosis/kyphosis  no CVA tendersness Neck: Negative for carotid bruits. JVD not elevated. Lungs: Clear bilaterally to auscultation without wheezes, rales, or rhonchi. Breathing is unlabored. Heart: Irregular rate and rhythm  with S1 mechanical S2.  2/6 systolic murmur  no rubs, or gallops appreciated. Abdomen: Soft, non-tender, non-distended with normoactive bowel sounds. No hepatomegaly. No rebound/guarding. No obvious abdominal masses. Msk:  Strength and tone appear normal for age. Extremities: No clubbing or cyanosis. No*  edema.  Distal pedal pulses are 2+ and equal bilaterally. Skin: Warm and Dry Neuro: Alert and oriented X 3. CN III-XII intact Grossly normal sensory and motor function . Psych:  Responds to questions appropriately with a normal affect.      Labs: Cardiac Enzymes No results for input(s): CKTOTAL, CKMB, TROPONINI in the last 72 hours. CBC Lab Results  Component Value Date    WBC 6.8 03/16/2015   HGB 12.3* 03/16/2015   HCT 39.6 03/16/2015   MCV 91.9 03/16/2015   PLT 197 03/16/2015   PROTIME:  Recent Labs  03/16/15 1825 03/17/15 0835  LABPROT 21.1* 21.3*  INR 1.83* 1.86*   Chemistry   Recent Labs Lab 03/16/15 1825  NA 138  K 3.8  CL 103  CO2 28  BUN 9  CREATININE 0.85  CALCIUM 8.8*  GLUCOSE 85   Lipids No results found for: CHOL, HDL, LDLCALC, TRIG BNP PRO B NATRIURETIC PEPTIDE (BNP)  Date/Time Value Ref Range Status  04/21/2014 02:37 PM 40.3 0 - 125 pg/mL Final  11/08/2013 04:37  PM 21.9 0 - 125 pg/mL Final   Thyroid Function Tests: No results for input(s): TSH, T4TOTAL, T3FREE, THYROIDAB in the last 72 hours.  Invalid input(s): FREET3    Miscellaneous No results found for: DDIMER  Radiology/Studies:  Ct Head Wo Contrast  03/13/2015   CLINICAL DATA:  Head and neck pain.  EXAM: CT HEAD WITHOUT CONTRAST  CT CERVICAL SPINE WITHOUT CONTRAST  TECHNIQUE: Multidetector CT imaging of the head and cervical spine was performed following the standard protocol without intravenous contrast. Multiplanar CT image reconstructions of the cervical spine were also generated.  COMPARISON:  CT scan of head of May 30, 2014.  FINDINGS: CT HEAD FINDINGS  Mild right frontal scalp hematoma is noted. Bony calvarium appears intact. No mass effect or midline shift is noted. Ventricular size is within normal limits. There is no evidence of mass lesion, hemorrhage or acute infarction.  CT CERVICAL SPINE FINDINGS  No fracture or spondylolisthesis is noted. Moderate degenerative disc disease is noted at C5-6 and C6-7 with anterior osteophyte formation. Posterior facet joints appear intact. Visualized lung apices appear normal.  IMPRESSION: Mild right frontal scalp hematoma. No acute intracranial abnormality seen.  Moderate degenerative disc disease is noted at C5-6 and C6-7. No acute abnormality seen in the cervical spine.   Electronically Signed   By: Lupita Raider, M.D.   On: 03/13/2015 18:18   Ct Cervical Spine Wo Contrast  03/13/2015   CLINICAL DATA:  Head and neck pain.  EXAM: CT HEAD WITHOUT CONTRAST  CT CERVICAL SPINE WITHOUT CONTRAST  TECHNIQUE: Multidetector CT imaging of the head and cervical spine was performed following the standard protocol without intravenous contrast. Multiplanar CT image reconstructions of the cervical spine were also generated.  COMPARISON:  CT scan of head of May 30, 2014.  FINDINGS: CT HEAD FINDINGS  Mild right frontal scalp hematoma is noted. Bony calvarium appears intact. No mass effect or midline shift is noted. Ventricular size is within normal limits. There is no evidence of mass lesion, hemorrhage or acute infarction.  CT CERVICAL SPINE FINDINGS  No fracture or spondylolisthesis is noted. Moderate degenerative disc disease is noted at C5-6 and C6-7 with anterior osteophyte formation. Posterior facet joints appear intact. Visualized lung apices appear normal.  IMPRESSION: Mild right frontal scalp hematoma. No acute intracranial abnormality seen.  Moderate degenerative disc disease is noted at C5-6 and C6-7. No acute abnormality seen in the cervical spine.   Electronically Signed   By: Lupita Raider, M.D.   On: 03/13/2015 18:18   Mr Brain Wo Contrast  03/16/2015   CLINICAL DATA:  Newly diagnosed seizures after a syncopal episode with fall. Headache. Visual field defect.  EXAM: MRI HEAD WITHOUT CONTRAST  TECHNIQUE: Multiplanar, multiecho pulse sequences of the brain and surrounding structures were obtained without intravenous contrast.  COMPARISON:  Head CT in 03/13/2015  FINDINGS: There is a small volume, likely subacute subdural hematoma over the left cerebral convexity, largest in the frontal region where it measures up to 6 mm in thickness. There is mild left cerebral hemispheric sulcal effacement, and there is 4 mm of rightward midline shift. A small amount of subdural hemorrhage is also present in the interhemispheric  fissure along the left aspect of the falx. There is no hydrocephalus. No acute infarct, parenchymal hemorrhage, or mass is seen.  The hippocampi are grossly symmetric in size and signal, however detailed evaluation is precluded by prominent motion artifact on thin section images through the temporal lobes. No  significant white matter disease is seen. Cerebral volume is normal for age.  Prior right cataract extraction is noted. There is mild left maxillary sinus mucosal thickening. Mastoid air cells are clear. Major intracranial vascular flow voids are preserved.  IMPRESSION: Small subdural hematoma over the left cerebral convexity and along the falx. 4 mm rightward midline shift.  Critical Value/emergent results were called by telephone at the time of interpretation on 03/16/2015 at 9:17 pm to Dr. Madilyn Hook, who verbally acknowledged these results.   Electronically Signed   By: Sebastian Ache M.D.   On: 03/16/2015 21:21    EKG:  Sinus w variable rates 18/11/41 freq atrial ectopy Telemetry no pauses but freq atrial bigeminy   Assessment and Plan:  Aortic dissection wth aortic valve replacement  SDH  Fall  Hypoglycemia  The histories are discrepant. I have been told he was hypoglycemic to the tune of 40. The patient tells me that it was described to him that he started having tonic jerking activity while he was standing as opposed after having fallen to the ground. This strongly speaks against a cardiovascular cause wherein we would've anticipated loss of consciousness and loss of postural tone prior to the onset of seizure activity.  At this point I would get an echocardiogram to look for LV function. Mild widening of his QRS is a little bit concerning as in patients who have QRS is greater than 100 ms implantable monitoring following "recurrent syncope "there is a 50% likelihood of seen in arrhythmic cause.  The other major issue is assessment of his aortic dissection from 10 years ago. Genetic testing  is far advanced at this juncture and this should be pursued. I will forward message to Dr. Tonny Bollman his primary cardiologist regarding this. I reviewed with the patient and his daughter the potential familial nature of aortic enlargement and aortic dissection  The patient does not have features of arachnodactyly to suggest Marfan he does wear glasses.   For now would agree with the resumption of anticoagulation if okay with neurology and neurosurgery Obtain 2-D echo Further evaluation of aortic dissection-remote as an outpatient  Thank you for the consultation  Sherryl Manges

## 2015-03-18 ENCOUNTER — Inpatient Hospital Stay (HOSPITAL_COMMUNITY): Payer: BLUE CROSS/BLUE SHIELD

## 2015-03-18 DIAGNOSIS — R55 Syncope and collapse: Secondary | ICD-10-CM

## 2015-03-18 DIAGNOSIS — G44309 Post-traumatic headache, unspecified, not intractable: Secondary | ICD-10-CM

## 2015-03-18 LAB — PROTIME-INR
INR: 1.64 — ABNORMAL HIGH (ref 0.00–1.49)
PROTHROMBIN TIME: 19.5 s — AB (ref 11.6–15.2)

## 2015-03-18 LAB — CBC
HCT: 40 % (ref 39.0–52.0)
HEMOGLOBIN: 12.6 g/dL — AB (ref 13.0–17.0)
MCH: 29 pg (ref 26.0–34.0)
MCHC: 31.5 g/dL (ref 30.0–36.0)
MCV: 92 fL (ref 78.0–100.0)
PLATELETS: 176 10*3/uL (ref 150–400)
RBC: 4.35 MIL/uL (ref 4.22–5.81)
RDW: 14.3 % (ref 11.5–15.5)
WBC: 5.4 10*3/uL (ref 4.0–10.5)

## 2015-03-18 LAB — GLUCOSE, CAPILLARY
GLUCOSE-CAPILLARY: 96 mg/dL (ref 65–99)
GLUCOSE-CAPILLARY: 74 mg/dL (ref 65–99)
Glucose-Capillary: 109 mg/dL — ABNORMAL HIGH (ref 65–99)

## 2015-03-18 LAB — BASIC METABOLIC PANEL
ANION GAP: 9 (ref 5–15)
BUN: 8 mg/dL (ref 6–20)
CHLORIDE: 105 mmol/L (ref 101–111)
CO2: 30 mmol/L (ref 22–32)
Calcium: 8.8 mg/dL — ABNORMAL LOW (ref 8.9–10.3)
Creatinine, Ser: 0.69 mg/dL (ref 0.61–1.24)
GFR calc Af Amer: >60 mL/min (ref >60)
GLUCOSE: 105 mg/dL — AB (ref 65–99)
POTASSIUM: 3.6 mmol/L (ref 3.5–5.1)
Sodium: 144 mmol/L (ref 135–145)

## 2015-03-18 LAB — HEPARIN LEVEL (UNFRACTIONATED)
HEPARIN UNFRACTIONATED: 0.61 [IU]/mL (ref 0.30–0.70)
Heparin Unfractionated: 0.64 IU/mL (ref 0.30–0.70)
Heparin Unfractionated: 0.59 IU/mL (ref 0.30–0.70)

## 2015-03-18 LAB — MAGNESIUM: MAGNESIUM: 1.9 mg/dL (ref 1.7–2.4)

## 2015-03-18 MED ORDER — TRAMADOL HCL 50 MG PO TABS
50.0000 mg | ORAL_TABLET | Freq: Four times a day (QID) | ORAL | Status: DC | PRN
  Administered 2015-03-18 – 2015-03-24 (×16): 50 mg via ORAL
  Filled 2015-03-18 (×16): qty 1

## 2015-03-18 MED ORDER — ACETAMINOPHEN 500 MG PO TABS
1000.0000 mg | ORAL_TABLET | Freq: Three times a day (TID) | ORAL | Status: DC | PRN
  Administered 2015-03-18 – 2015-03-21 (×2): 1000 mg via ORAL
  Filled 2015-03-18 (×2): qty 2

## 2015-03-18 MED ORDER — METOPROLOL SUCCINATE ER 25 MG PO TB24
50.0000 mg | ORAL_TABLET | Freq: Every day | ORAL | Status: DC
  Administered 2015-03-18 – 2015-03-28 (×10): 50 mg via ORAL
  Filled 2015-03-18 (×11): qty 2

## 2015-03-18 NOTE — Progress Notes (Signed)
PATIENT DETAILS Name: [name] Age: 54 y.o. Sex: male Date of Birth: Jan 16, 1961 Admit Date: 03/16/2015 Admitting Physician Hillary Bow, DO ZOX:[id number],[id number] N, MD  Subjective: Continues to have mild headaches.   Assessment/Plan: Principal Problem: SDH (subdural hematoma): Suspect secondary to syncope/seizures- while being on Coumadin. Although recommendations from neurosurgery are to hold Coumadin for 3 weeks, after discussion with cardiology-if needed could hold for 1 week but not longer. Currently on IV heparin-seems to be tolerating it well-nonfocal exam. We will repeat CT head tomorrow morning, if no major findings-since tolerating heparin infusion well-suspect because restart Coumadin-and in keep INR close to 2 as much as possible area patient and spouse at bedside agreeable with this plan.   Active Problems: Syncope: Sustained on 10/4-without any prodromal symptoms. Patient was at work doing inventory-next thing he remembers is being surrounded by EMS. Per spouse Coworkers described seizure-like activity. Some question of hypoglycemia remains-on Victoza for weight loss without history of diabetes. Although could have seizures from SDH but suspect SDH was a result of syncope. Seen by cardiology, recommendations are to await a echocardiogram, defer to cardiology if patient needs a implantable loop recorder. Telemetry continues to show some pauses and couplets of PACs. Okay to resume a beta blocker per cardiology.  S/P aortic valve replacement: With mechanical valve. On long-term Coumadin treatment-currently on hold (10/6) last dose on-see above.  Headache:? Postconcussion headache. Avoid NSAIDs-as on anticoagulation-Will try and manage with acetaminophen and prn Ultram  Morbid obesity: Discontinue Victoza given question of hypoglycemia-further workup/treatment deferred to the outpatient setting  Dyslipidemia: Continue statin  Disposition: Remain  inpatient  Antimicrobial agents  See below  Anti-infectives    None      DVT Prophylaxis:  SCD's  Code Status:  DNR  Family Communication Spouse at bedside  Procedures: None  CONSULTS:  cardiology, neurology and Neurosurgery  Time spent 30 minutes-Greater than 50% of this time was spent in counseling, explanation of diagnosis, planning of further management, and coordination of care.  MEDICATIONS: Scheduled Meds: . levETIRAcetam  500 mg Oral BID  . metoprolol succinate  50 mg Oral Daily  . rosuvastatin  10 mg Oral Daily   Continuous Infusions: . heparin 1,750 Units/hr (03/18/15 0801)   PRN Meds:.acetaminophen, traMADol    PHYSICAL EXAM: Vital signs in last 24 hours: Filed Vitals:   03/18/15 0009 03/18/15 0133 03/18/15 0523 03/18/15 1012  BP:  123/45 126/74 127/77  Pulse: 60 95 63 60  Temp:  97.1 F (36.2 C) 97.5 F (36.4 C) 98.4 F (36.9 C)  TempSrc:  Oral Oral Oral  Resp: 16 18 20 18   Height:      Weight:      SpO2:  92% 92% 98%    Weight change:  Filed Weights   03/16/15 1536  Weight: 145.605 kg (321 lb)   Body mass index is 41.2 kg/(m^2).   Gen Exam: Awake and alert with clear speech. Neck: Supple, No JVD.   Chest: B/L Clear.   CVS: S1 S2 Regular, no murmurs.  Abdomen: soft, BS +, non tender, non distended.  Extremities: no edema, lower extremities warm to touch. Neurologic: Non Focal.   Skin: No Rash.   Wounds: N/A.   Intake/Output from previous day: No intake or output data in the 24 hours ending 03/18/15 1104   LAB RESULTS: CBC  Recent Labs Lab 03/13/15 1743 03/16/15 1825 03/18/15 0644  WBC 7.8 6.8  5.4  HGB 12.5* 12.3* 12.6*  HCT 39.8 39.6 40.0  PLT 189 197 176  MCV 90.7 91.9 92.0  MCH 28.5 28.5 29.0  MCHC 31.4 31.1 31.5  RDW 13.9 14.2 14.3  LYMPHSABS  --  1.5  --   MONOABS  --  1.0  --   EOSABS  --  0.1  --   BASOSABS  --  0.0  --     Chemistries   Recent Labs Lab 03/13/15 1743 03/16/15 1825  03/18/15 0644  NA 140 138 144  K 3.5 3.8 3.6  CL 105 103 105  CO2 GLUCOSE 79 85 105*  BUN CREATININE 0.77 0.85 0.69  CALCIUM 9.0 8.8* 8.8*  MG  --   --  1.9    CBG:  Recent Labs Lab 03/13/15 2056 03/17/15 0622 03/17/15 1609 03/17/15 2220 03/18/15 0659  GLUCAP 91 93 82 92 109*    GFR Estimated Creatinine Clearance: 156.9 mL/min (by C-G formula based on Cr of 0.69).  Coagulation profile  Recent Labs Lab 03/13/15 2051 03/16/15 1825 03/17/15 0835 03/18/15 0644  INR 1.78* 1.83* 1.86* 1.64*    Cardiac Enzymes No results for input(s): CKMB, TROPONINI, MYOGLOBIN in the last 168 hours.  Invalid input(s): CK  Invalid input(s): POCBNP No results for input(s): DDIMER in the last 72 hours. No results for input(s): HGBA1C in the last 72 hours. No results for input(s): CHOL, HDL, LDLCALC, TRIG, CHOLHDL, LDLDIRECT in the last 72 hours. No results for input(s): TSH, T4TOTAL, T3FREE, THYROIDAB in the last 72 hours.  Invalid input(s): FREET3 No results for input(s): VITAMINB12, FOLATE, FERRITIN, TIBC, IRON, RETICCTPCT in the last 72 hours. No results for input(s): LIPASE, AMYLASE in the last 72 hours.  Urine Studies No results for input(s): UHGB, CRYS in the last 72 hours.  Invalid input(s): UACOL, UAPR, USPG, UPH, UTP, UGL, UKET, UBIL, UNIT, UROB, ULEU, UEPI, UWBC, URBC, UBAC, CAST, UCOM, BILUA  MICROBIOLOGY: No results found for this or any previous visit (from the past 240 hour(s)).  RADIOLOGY STUDIES/RESULTS: Ct Head Wo Contrast  03/13/2015   CLINICAL DATA:  Head and neck pain.  EXAM: CT HEAD WITHOUT CONTRAST  CT CERVICAL SPINE WITHOUT CONTRAST  TECHNIQUE: Multidetector CT imaging of the head and cervical spine was performed following the standard protocol without intravenous contrast. Multiplanar CT image reconstructions of the cervical spine were also generated.  COMPARISON:  CT scan of head of May 30, 2014.  FINDINGS: CT HEAD FINDINGS  Mild  right frontal scalp hematoma is noted. Bony calvarium appears intact. No mass effect or midline shift is noted. Ventricular size is within normal limits. There is no evidence of mass lesion, hemorrhage or acute infarction.  CT CERVICAL SPINE FINDINGS  No fracture or spondylolisthesis is noted. Moderate degenerative disc disease is noted at C5-6 and C6-7 with anterior osteophyte formation. Posterior facet joints appear intact. Visualized lung apices appear normal.  IMPRESSION: Mild right frontal scalp hematoma. No acute intracranial abnormality seen.  Moderate degenerative disc disease is noted at C5-6 and C6-7. No acute abnormality seen in the cervical spine.   Electronically Signed   By: Lupita Raider, M.D.   On: 03/13/2015 18:18   Ct Cervical Spine Wo Contrast  03/13/2015   CLINICAL DATA:  Head and neck pain.  EXAM: CT HEAD WITHOUT CONTRAST  CT CERVICAL SPINE WITHOUT CONTRAST  TECHNIQUE: Multidetector CT imaging of the head and cervical spine was performed following the standard protocol  without intravenous contrast. Multiplanar CT image reconstructions of the cervical spine were also generated.  COMPARISON:  CT scan of head of May 30, 2014.  FINDINGS: CT HEAD FINDINGS  Mild right frontal scalp hematoma is noted. Bony calvarium appears intact. No mass effect or midline shift is noted. Ventricular size is within normal limits. There is no evidence of mass lesion, hemorrhage or acute infarction.  CT CERVICAL SPINE FINDINGS  No fracture or spondylolisthesis is noted. Moderate degenerative disc disease is noted at C5-6 and C6-7 with anterior osteophyte formation. Posterior facet joints appear intact. Visualized lung apices appear normal.  IMPRESSION: Mild right frontal scalp hematoma. No acute intracranial abnormality seen.  Moderate degenerative disc disease is noted at C5-6 and C6-7. No acute abnormality seen in the cervical spine.   Electronically Signed   By: Lupita Raider, M.D.   On: 03/13/2015 18:18    Mr Brain Wo Contrast  03/16/2015   CLINICAL DATA:  Newly diagnosed seizures after a syncopal episode with fall. Headache. Visual field defect.  EXAM: MRI HEAD WITHOUT CONTRAST  TECHNIQUE: Multiplanar, multiecho pulse sequences of the brain and surrounding structures were obtained without intravenous contrast.  COMPARISON:  Head CT in 03/13/2015  FINDINGS: There is a small volume, likely subacute subdural hematoma over the left cerebral convexity, largest in the frontal region where it measures up to 6 mm in thickness. There is mild left cerebral hemispheric sulcal effacement, and there is 4 mm of rightward midline shift. A small amount of subdural hemorrhage is also present in the interhemispheric fissure along the left aspect of the falx. There is no hydrocephalus. No acute infarct, parenchymal hemorrhage, or mass is seen.  The hippocampi are grossly symmetric in size and signal, however detailed evaluation is precluded by prominent motion artifact on thin section images through the temporal lobes. No significant white matter disease is seen. Cerebral volume is normal for age.  Prior right cataract extraction is noted. There is mild left maxillary sinus mucosal thickening. Mastoid air cells are clear. Major intracranial vascular flow voids are preserved.  IMPRESSION: Small subdural hematoma over the left cerebral convexity and along the falx. 4 mm rightward midline shift.  Critical Value/emergent results were called by telephone at the time of interpretation on 03/16/2015 at 9:17 pm to Dr. Madilyn Hook, who verbally acknowledged these results.   Electronically Signed   By: Sebastian Ache M.D.   On: 03/16/2015 21:21    Jeoffrey Massed, MD  [facility] Pager:[phone]  If 7PM-7AM, please contact night-coverage [email or web address] Password [email or web address] 03/18/2015, 11:04 AM   LOS: 1 day

## 2015-03-18 NOTE — Progress Notes (Addendum)
NEURO HOSPITALIST PROGRESS NOTE   SUBJECTIVE:                                                                                                                        No further seizures noted. Complains of a mild HA which is unchanged since admission, but otherwise has no new neurological complains. EEG normal. Tolerating keppra well. IV heparin started yesterday.  OBJECTIVE:                                                                                                                           Vital signs in last 24 hours: Temp:  [97 F (36.1 C)-98.8 F (37.1 C)] 97.5 F (36.4 C) (10/09 0523) Pulse Rate:  [34-95] 63 (10/09 0523) Resp:  [16-20] 20 (10/09 0523) BP: (109-126)/(45-74) 126/74 mmHg (10/09 0523) SpO2:  [92 %-98 %] 92 % (10/09 0523)  Intake/Output from previous day:   Intake/Output this shift:   Nutritional status: Diet Carb Modified Fluid consistency:: Thin; Room service appropriate?: Yes  Past Medical History  Diagnosis Date  . S/P aortic valve and root replacement     HX of St. Jude  . Chest pain, atypical   . Hypertension     Unspecified  . Hyperlipidemia     Mixed  . Obesity   . Sleep apnea, obstructive   . Coronary artery reimplantation   . Ascending aortic aneurysm and dissection     2006 repaired  7 cm aneurysm   Physical exam:  Constitutional: well developed, pleasant male in no apparent distress.  Eyes: no jaundice or exophthalmos.  Head: normocephalic. Neck: supple, no bruits, no JVD. Cardiac: no murmurs. Lungs: clear. Abdomen: soft, no tender, no mass. Extremities: no edema, clubbing, or cyanosis.  Skin: no rash  Neurologic Exam:  General: NAD Mental Status: Alert, oriented, thought content appropriate. Speech fluent without evidence of aphasia. Able to follow 3 step commands without difficulty. Cranial Nerves: II: Discs flat bilaterally; Visual fields grossly normal, pupils equal, round,  reactive to light and accommodation III,IV, VI: ptosis not present, extra-ocular motions intact bilaterally V,VII: smile symmetric, facial light touch sensation normal bilaterally VIII: hearing normal bilaterally IX,X: uvula rises symmetrically XI: bilateral shoulder shrug XII: midline tongue extension without atrophy  or fasciculations  Motor: Right :Upper extremity 5/5Left: Upper extremity 5/5 Lower extremity 5/5Lower extremity 5/5 Tone and bulk:normal tone throughout; no atrophy noted Sensory: Pinprick and light touch intact throughout, bilaterally Deep Tendon Reflexes:  Right: Upper Extremity Left: Upper extremity   biceps (C-5 to C-6) 2/4 biceps (C-5 to C-6) 2/4 tricep (C7) 2/4triceps (C7) 2/4 Brachioradialis (C6) 2/4Brachioradialis (C6) 2/4  Lower Extremity Lower Extremity  quadriceps (L-2 to L-4) 2/4 quadriceps (L-2 to L-4) 2/4 Achilles (S1) 2/4Achilles (S1) 2/4  Plantars: Right: downgoingLeft: downgoing Cerebellar: normal finger-to-nose, normal heel-to-shin test Gait:  No tested due to multiple leads  Lab Results: No results found for: CHOL Lipid Panel No results for input(s): CHOL, TRIG, HDL, CHOLHDL, VLDL, LDLCALC in the last 72 hours.  Studies/Results: Mr Brain Wo Contrast  03/16/2015   CLINICAL DATA:  Newly diagnosed seizures after a syncopal episode with fall. Headache. Visual field defect.  EXAM: MRI HEAD WITHOUT CONTRAST  TECHNIQUE: Multiplanar, multiecho pulse sequences of the brain and surrounding structures were obtained without intravenous contrast.  COMPARISON:  Head CT in 03/13/2015  FINDINGS: There is a small volume, likely subacute subdural hematoma over the  left cerebral convexity, largest in the frontal region where it measures up to 6 mm in thickness. There is mild left cerebral hemispheric sulcal effacement, and there is 4 mm of rightward midline shift. A small amount of subdural hemorrhage is also present in the interhemispheric fissure along the left aspect of the falx. There is no hydrocephalus. No acute infarct, parenchymal hemorrhage, or mass is seen.  The hippocampi are grossly symmetric in size and signal, however detailed evaluation is precluded by prominent motion artifact on thin section images through the temporal lobes. No significant white matter disease is seen. Cerebral volume is normal for age.  Prior right cataract extraction is noted. There is mild left maxillary sinus mucosal thickening. Mastoid air cells are clear. Major intracranial vascular flow voids are preserved.  IMPRESSION: Small subdural hematoma over the left cerebral convexity and along the falx. 4 mm rightward midline shift.  Critical Value/emergent results were called by telephone at the time of interpretation on 03/16/2015 at 9:17 pm to Dr. Madilyn Hook, who verbally acknowledged these results.   Electronically Signed   By: Sebastian Ache M.D.   On: 03/16/2015 21:21    MEDICATIONS                                                                                                                        Scheduled: . levETIRAcetam  500 mg Oral BID  . rosuvastatin  10 mg Oral Daily    ASSESSMENT/PLAN:  54 y/o with new onset isolated GTC seizure. Non focal neuro-exam. MRI brain reveals a small acute left convexity SDH with 4 mm shift. EEG without seizures Recommend; Continue keppra 500 mg BID. Hold coumadin.  Has a mechanical valve and is off coumadin due to small acute SDH: IV heparin for stroke prevention as per pharmacy. Follow up CT brain tomorrow.  Wyatt Portela, MD [facility] [phone]  03/18/2015, 8:56 AM

## 2015-03-18 NOTE — Progress Notes (Signed)
ANTICOAGULATION CONSULT NOTE - Follow Up Consult  Pharmacy Consult for heparin Indication: mechanical AVR  Allergies  Allergen Reactions  . Nsaids Other (See Comments)    Told not to take meds-per patient    Patient Measurements: Height:  (188 cm) Weight: (!) 321 lb (145.605 kg) IBW/kg (Calculated) : 82.2 Heparin Dosing Weight: 116 kg  Vital Signs: Temp: 98.3 F (36.8 C) (10/09 2204) Temp Source: Oral (10/09 2204) BP: 129/83 mmHg (10/09 2204) Pulse Rate: 53 (10/09 2204)  Labs:  Recent Labs  03/16/15 1825 03/17/15 0835  03/18/15 0644 03/18/15 1410 03/18/15 2154  HGB 12.3*  --   --  12.6*  --   --   HCT 39.6  --   --  40.0  --   --   PLT 197  --   --  176  --   --   LABPROT 21.1* 21.3*  --  19.5*  --   --   INR 1.83* 1.86*  --  1.64*  --   --   HEPARINUNFRC  --   --   < > 0.64 0.59 0.61  CREATININE 0.85  --   --  0.69  --   --   < > = values in this interval not displayed.  Estimated Creatinine Clearance: 156.9 mL/min (by C-G formula based on Cr of 0.69).   Medications:  Infusions:  . heparin 1,600 Units/hr (03/18/15 2135)    Assessment: 54 y/o male on chronic warfarin for mechanical AVR with seizure activity and fall on 10/4. CT head revealed subacute SDH. Neurosurgery recommending to hold warfarin until repeat CT head in 3 weeks however neurology concerned for risk of stroke being off warfarin for that long with mechanical AVR. Neurology discussed risk/benefit with patient and wife. Pharmacy consulted to begin IV heparin with NO BOLUS and with lower goal range.   Heparin level remains supratherapeutic at 0.61 on 1600 units/hr. No other bleeding noted, CBC is stable.   Warfarin PTA: 7.5 mg daily EXCEPT 3.75 mg Fri  Goal of Therapy:  Heparin level 0.3-0.5 units/ml Monitor platelets by anticoagulation protocol: Yes   Plan:  - No heparin boluses with SDH - Decrease heparin drip to 1400 units/hr IV - Check an 8 hour heparin level - Daily heparin level  and CBC  Lysle Pearl, PharmD, BCPS Pager # [phone] 03/18/2015 10:30 PM

## 2015-03-18 NOTE — Progress Notes (Signed)
  Echocardiogram 2D Echocardiogram has been performed.  Arvil Chaco 03/18/2015, 12:22 PM

## 2015-03-18 NOTE — Progress Notes (Signed)
Patient reports last blood draw was taken from his right arm today; his current IV access is in his left hand infusing heparin; he reports only a ten minute time loss last night from his IV in the right arm to replacement of IV access into his left hand. Pharmacy continues to monitor and adjust Heparin drip; no loss of IV access or interuption of Heparin drip this shift; site infusing with no leakage.

## 2015-03-18 NOTE — Progress Notes (Signed)
ANTICOAGULATION CONSULT NOTE - Follow Up Consult  Pharmacy Consult for heparin Indication: mechanical AVR  Allergies  Allergen Reactions  . Nsaids Other (See Comments)    Told not to take meds-per patient    Patient Measurements: Height:  (188 cm) Weight: (!) 321 lb (145.605 kg) IBW/kg (Calculated) : 82.2 Heparin Dosing Weight: 116 kg  Vital Signs: Temp: 98.3 F (36.8 C) (10/09 1351) Temp Source: Oral (10/09 1351) BP: 121/68 mmHg (10/09 1351) Pulse Rate: 61 (10/09 1351)  Labs:  Recent Labs  03/16/15 1825 03/17/15 0835 03/17/15 2030 03/18/15 0644 03/18/15 1410  HGB 12.3*  --   --  12.6*  --   HCT 39.6  --   --  40.0  --   PLT 197  --   --  176  --   LABPROT 21.1* 21.3*  --  19.5*  --   INR 1.83* 1.86*  --  1.64*  --   HEPARINUNFRC  --   --  0.18* 0.64 0.59  CREATININE 0.85  --   --  0.69  --     Estimated Creatinine Clearance: 156.9 mL/min (by C-G formula based on Cr of 0.69).   Medications:  Infusions:  . heparin 1,750 Units/hr (03/18/15 0801)    Assessment: 54 y/o male on chronic warfarin for mechanical AVR with seizure activity and fall on 10/4. CT head revealed subacute SDH. Neurosurgery recommending to hold warfarin until repeat CT head in 3 weeks however neurology concerned for risk of stroke being off warfarin for that long with mechanical AVR. Neurology discussed risk/benefit with patient and wife. Pharmacy consulted to begin IV heparin with NO BOLUS and with lower goal range.   Heparin level remains supratherapeutic at 0.59 on 1750 units/hr. No other bleeding noted, CBC is stable. INR is 1.64. Spoke with RN, patient had IV site change last night so wonder if initial heparin level was reflective of the previous IV site not working. She reports no infusion issues today.  Warfarin PTA: 7.5 mg daily EXCEPT 3.75 mg Fri  Goal of Therapy:  Heparin level 0.3-0.5 units/ml Monitor platelets by anticoagulation protocol: Yes   Plan:  - No heparin boluses  with SDH - Decrease heparin drip to 1600 units/hr IV - 6 hr heparin level - Daily heparin level and CBC - Monitor for s/sx of bleeding   [facility], [place].D., BCPS Clinical Pharmacist Pager: [phone] 03/18/2015 3:15 PM

## 2015-03-18 NOTE — Progress Notes (Signed)
ANTICOAGULATION CONSULT NOTE - Initial Consult  Pharmacy Consult for heparin Indication: mechanical AVR  Allergies  Allergen Reactions  . Nsaids Other (See Comments)    Told not to take meds-per patient    Patient Measurements: Height:  (188 cm) Weight: (!) 321 lb (145.605 kg) IBW/kg (Calculated) : 82.2 Heparin Dosing Weight: 116 kg  Vital Signs: Temp: 97.5 F (36.4 C) (10/09 0523) Temp Source: Oral (10/09 0523) BP: 126/74 mmHg (10/09 0523) Pulse Rate: 63 (10/09 0523)  Labs:  Recent Labs  03/16/15 1825 03/17/15 0835 03/17/15 2030 03/18/15 0644  HGB 12.3*  --   --  12.6*  HCT 39.6  --   --  40.0  PLT 197  --   --  176  LABPROT 21.1* 21.3*  --  19.5*  INR 1.83* 1.86*  --  1.64*  HEPARINUNFRC  --   --  0.18* 0.64  CREATININE 0.85  --   --   --     Estimated Creatinine Clearance: 147.7 mL/min (by C-G formula based on Cr of 0.85).   Medical History: Past Medical History  Diagnosis Date  . S/P aortic valve and root replacement     HX of St. Jude  . Chest pain, atypical   . Hypertension     Unspecified  . Hyperlipidemia     Mixed  . Obesity   . Sleep apnea, obstructive   . Coronary artery reimplantation   . Ascending aortic aneurysm and dissection     2006 repaired  7 cm aneurysm    Medications:  Prescriptions prior to admission  Medication Sig Dispense Refill Last Dose  . aspirin 81 MG EC tablet Take 81 mg by mouth daily.     03/16/2015 at Unknown time  . cholecalciferol (VITAMIN D) 1000 UNITS tablet Take 2,000 Units by mouth 2 (two) times daily.    03/16/2015 at Unknown time  . Ixekizumab (TALTZ [place]) Inject 1 application into the skin every 30 (thirty) days.   Past Month at Unknown time  . Liraglutide (VICTOZA [place]) Inject 1.2 mg into the skin daily.    03/16/2015 at Unknown time  . metoprolol succinate (TOPROL-XL) 50 MG 24 hr tablet Take 50 mg by mouth daily.    03/15/2015 at 2130  . Multiple Vitamin (MULTIVITAMIN WITH MINERALS) TABS tablet Take 1 tablet  by mouth daily.   03/16/2015 at Unknown time  . rosuvastatin (CRESTOR) 10 MG tablet Take 10 mg by mouth daily.    03/15/2015 at Unknown time  . traMADol (ULTRAM) 50 MG tablet Take 1 tablet (50 mg total) by mouth every 6 (six) hours as needed. (Patient taking differently: Take 50 mg by mouth every 6 (six) hours as needed for moderate pain. ) 20 tablet 0 03/16/2015 at Unknown time  . warfarin (COUMADIN) 7.5 MG tablet TAKE AS DIRECTED BY ANTICOAGULATION CLINIC (Patient taking differently: Take 3.75-7.5 mg by mouth daily at 6 PM. Takes 3.75mg  on Fri only Takes 7.5mg  all other days) 35 tablet 3 03/15/2015 at Unknown time    Assessment: 54 y/o male on chronic warfarin for mechanical AVR with seizure activity and fall on 10/4. CT head revealed subacute SDH. Neurosurgery recommending to hold warfarin until repeat CT head in 3 weeks however neurology concerned for risk of stroke being off warfarin for that long with mechanical AVR.  Pharmacy consulted to begin IV heparin with NO BOLUS and with lower goal range. Supratherapeutic HL 0.64. Heparin drip running at 1950 units/hr. No other bleeding noted, CBC is  stable. INR is 1.64.  Warfarin PTA: 7.5 mg daily EXCEPT 3.75 mg Fri  Goal of Therapy:  Heparin level 0.3-0.5 units/ml Monitor platelets by anticoagulation protocol: Yes   Plan:  - No heparin boluses with SDH - Decrease heparin drip to 1750 units/hr IV - 6 hr heparin level - Daily heparin level and CBC - Monitor for s/sx of bleeding   Sherron Monday, PharmD Clinical Pharmacy Resident Pager: [phone] 03/18/2015 8:00 AM

## 2015-03-18 NOTE — Progress Notes (Addendum)
DAILY PROGRESS NOTE  Subjective:  No events overnight. Telemetry shows grouped beating of PAC couplets. B-blocker has been stopped. Warfarin on hold for SDH secondary to trauma/fall. Awaiting echocardiogram - I will review later today.  Objective:  Temp:  [97 F (36.1 C)-98.8 F (37.1 C)] 97.5 F (36.4 C) (10/09 0523) Pulse Rate:  [34-95] 63 (10/09 0523) Resp:  [16-20] 20 (10/09 0523) BP: (109-126)/(45-74) 126/74 mmHg (10/09 0523) SpO2:  [92 %-98 %] 92 % (10/09 0523) Weight change:   Intake/Output from previous day:    Intake/Output from this shift:    Medications: Current Facility-Administered Medications  Medication Dose Route Frequency Provider Last Rate Last Dose  . acetaminophen (TYLENOL) tablet 1,000 mg  1,000 mg Oral Q8H PRN Jonetta Osgood, MD   1,000 mg at 03/18/15 0703  . heparin ADULT infusion 100 units/mL (25000 units/250 mL)  1,750 Units/hr Intravenous Continuous Jonetta Osgood, MD 17.5 mL/hr at 03/18/15 0801 1,750 Units/hr at 03/18/15 0801  . levETIRAcetam (KEPPRA) tablet 500 mg  500 mg Oral BID Amie Portland, MD   500 mg at 03/17/15 2128  . rosuvastatin (CRESTOR) tablet 10 mg  10 mg Oral Daily Etta Quill, DO   10 mg at 03/17/15 1058    Physical Exam: General appearance: alert and no distress Lungs: clear to auscultation bilaterally Heart: regularly irregular rhythm and sharp mechanical valve sounds Extremities: extremities normal, atraumatic, no cyanosis or edema Neurologic: Mental status: Alert, oriented, thought content appropriate  Lab Results: Results for orders placed or performed during the hospital encounter of 03/16/15 (from the past 48 hour(s))  Protime-INR     Status: Abnormal   Collection Time: 03/16/15  6:25 PM  Result Value Ref Range   Prothrombin Time 21.1 (H) 11.6 - 15.2 seconds   INR 1.83 (H) 0.00 - 1.49  CBC with Differential/Platelet     Status: Abnormal   Collection Time: 03/16/15  6:25 PM  Result Value Ref Range   WBC 6.8 4.0 - 10.5 K/uL   RBC 4.31 4.22 - 5.81 MIL/uL   Hemoglobin 12.3 (L) 13.0 - 17.0 g/dL   HCT 39.6 39.0 - 52.0 %   MCV 91.9 78.0 - 100.0 fL   MCH 28.5 26.0 - 34.0 pg   MCHC 31.1 30.0 - 36.0 g/dL   RDW 14.2 11.5 - 15.5 %   Platelets 197 150 - 400 K/uL   Neutrophils Relative % 61 %   Neutro Abs 4.2 1.7 - 7.7 K/uL   Lymphocytes Relative 22 %   Lymphs Abs 1.5 0.7 - 4.0 K/uL   Monocytes Relative 14 %   Monocytes Absolute 1.0 0.1 - 1.0 K/uL   Eosinophils Relative 2 %   Eosinophils Absolute 0.1 0.0 - 0.7 K/uL   Basophils Relative 1 %   Basophils Absolute 0.0 0.0 - 0.1 K/uL  Basic metabolic panel     Status: Abnormal   Collection Time: 03/16/15  6:25 PM  Result Value Ref Range   Sodium 138 135 - 145 mmol/L   Potassium 3.8 3.5 - 5.1 mmol/L   Chloride 103 101 - 111 mmol/L   CO2 28 22 - 32 mmol/L   Glucose, Bld 85 65 - 99 mg/dL   BUN 9 6 - 20 mg/dL   Creatinine, Ser 0.85 0.61 - 1.24 mg/dL   Calcium 8.8 (L) 8.9 - 10.3 mg/dL   GFR calc non Af Amer >60 >60 mL/min   GFR calc Af Amer >60 >60 mL/min    Comment: (  NOTE) The eGFR has been calculated using the CKD EPI equation. This calculation has not been validated in all clinical situations. eGFR's persistently <60 mL/min signify possible Chronic Kidney Disease.    Anion gap 7 5 - 15  Glucose, capillary     Status: None   Collection Time: 03/17/15  6:22 AM  Result Value Ref Range   Glucose-Capillary 93 65 - 99 mg/dL   Comment 1 Notify RN    Comment 2 Document in Chart   Protime-INR     Status: Abnormal   Collection Time: 03/17/15  8:35 AM  Result Value Ref Range   Prothrombin Time 21.3 (H) 11.6 - 15.2 seconds   INR 1.86 (H) 0.00 - 1.49  Glucose, capillary     Status: None   Collection Time: 03/17/15  4:09 PM  Result Value Ref Range   Glucose-Capillary 82 65 - 99 mg/dL  Heparin level (unfractionated)     Status: Abnormal   Collection Time: 03/17/15  8:30 PM  Result Value Ref Range   Heparin Unfractionated 0.18 (L) 0.30 -  0.70 IU/mL    Comment:        IF HEPARIN RESULTS ARE BELOW EXPECTED VALUES, AND PATIENT DOSAGE HAS BEEN CONFIRMED, SUGGEST FOLLOW UP TESTING OF ANTITHROMBIN III LEVELS.   Glucose, capillary     Status: None   Collection Time: 03/17/15 10:20 PM  Result Value Ref Range   Glucose-Capillary 92 65 - 99 mg/dL   Comment 1 Notify RN    Comment 2 Document in Chart   Basic metabolic panel     Status: Abnormal   Collection Time: 03/18/15  6:44 AM  Result Value Ref Range   Sodium 144 135 - 145 mmol/L   Potassium 3.6 3.5 - 5.1 mmol/L   Chloride 105 101 - 111 mmol/L   CO2 30 22 - 32 mmol/L   Glucose, Bld 105 (H) 65 - 99 mg/dL   BUN 8 6 - 20 mg/dL   Creatinine, Ser 0.69 0.61 - 1.24 mg/dL   Calcium 8.8 (L) 8.9 - 10.3 mg/dL   GFR calc non Af Amer >60 >60 mL/min   GFR calc Af Amer >60 >60 mL/min    Comment: (NOTE) The eGFR has been calculated using the CKD EPI equation. This calculation has not been validated in all clinical situations. eGFR's persistently <60 mL/min signify possible Chronic Kidney Disease.    Anion gap 9 5 - 15  Magnesium     Status: None   Collection Time: 03/18/15  6:44 AM  Result Value Ref Range   Magnesium 1.9 1.7 - 2.4 mg/dL  Heparin level (unfractionated)     Status: None   Collection Time: 03/18/15  6:44 AM  Result Value Ref Range   Heparin Unfractionated 0.64 0.30 - 0.70 IU/mL    Comment:        IF HEPARIN RESULTS ARE BELOW EXPECTED VALUES, AND PATIENT DOSAGE HAS BEEN CONFIRMED, SUGGEST FOLLOW UP TESTING OF ANTITHROMBIN III LEVELS.   CBC     Status: Abnormal   Collection Time: 03/18/15  6:44 AM  Result Value Ref Range   WBC 5.4 4.0 - 10.5 K/uL   RBC 4.35 4.22 - 5.81 MIL/uL   Hemoglobin 12.6 (L) 13.0 - 17.0 g/dL   HCT 40.0 39.0 - 52.0 %   MCV 92.0 78.0 - 100.0 fL   MCH 29.0 26.0 - 34.0 pg   MCHC 31.5 30.0 - 36.0 g/dL   RDW 14.3 11.5 - 15.5 %   Platelets  176 150 - 400 K/uL  Protime-INR     Status: Abnormal   Collection Time: 03/18/15  6:44 AM    Result Value Ref Range   Prothrombin Time 19.5 (H) 11.6 - 15.2 seconds   INR 1.64 (H) 0.00 - 1.49  Glucose, capillary     Status: Abnormal   Collection Time: 03/18/15  6:59 AM  Result Value Ref Range   Glucose-Capillary 109 (H) 65 - 99 mg/dL    Imaging: Mr Brain Wo Contrast  03/16/2015   CLINICAL DATA:  Newly diagnosed seizures after a syncopal episode with fall. Headache. Visual field defect.  EXAM: MRI HEAD WITHOUT CONTRAST  TECHNIQUE: Multiplanar, multiecho pulse sequences of the brain and surrounding structures were obtained without intravenous contrast.  COMPARISON:  Head CT in 03/13/2015  FINDINGS: There is a small volume, likely subacute subdural hematoma over the left cerebral convexity, largest in the frontal region where it measures up to 6 mm in thickness. There is mild left cerebral hemispheric sulcal effacement, and there is 4 mm of rightward midline shift. A small amount of subdural hemorrhage is also present in the interhemispheric fissure along the left aspect of the falx. There is no hydrocephalus. No acute infarct, parenchymal hemorrhage, or mass is seen.  The hippocampi are grossly symmetric in size and signal, however detailed evaluation is precluded by prominent motion artifact on thin section images through the temporal lobes. No significant white matter disease is seen. Cerebral volume is normal for age.  Prior right cataract extraction is noted. There is mild left maxillary sinus mucosal thickening. Mastoid air cells are clear. Major intracranial vascular flow voids are preserved.  IMPRESSION: Small subdural hematoma over the left cerebral convexity and along the falx. 4 mm rightward midline shift.  Critical Value/emergent results were called by telephone at the time of interpretation on 03/16/2015 at 9:17 pm to Dr. Ralene Bathe, who verbally acknowledged these results.   Electronically Signed   By: Logan Bores M.D.   On: 03/16/2015 21:21    Assessment:  1. Principal Problem: 2.    SDH (subdural hematoma) (HCC) 3. Active Problems: 4.   S/P aortic valve replacement 5.   Long term current use of anticoagulant 6.   Post-concussion headache 7.   Faintness 8.   Plan:  1. Mr. Joiner seems to be doing better, but still has headache. b-blocker has been stopped, today, there is grouped beating with PAC's in a regular pattern. I would recommend restarting b-blocker. With regards to anticoagulation, he could be safely off of all anticoagulation (heparin and warfarin) for up to 7 days with a mechanical aortic valve, but not 3 weeks. Restarting after this time period, would require bridging, typically with lovenox back to warfarin. Agree with repeat CT to look for stability of SDH. Scheduled for echo today which I will review and comment on. As per Dr. Olin Pia note, may be a candidate for an ILR.  Time Spent Directly with Patient:  15 minutes  Length of Stay:  LOS: 1 day   Pixie Casino, MD, [facility] Attending Cardiologist Mark 03/18/2015, 9:04 AM

## 2015-03-19 ENCOUNTER — Encounter (HOSPITAL_COMMUNITY): Payer: Self-pay | Admitting: Radiology

## 2015-03-19 ENCOUNTER — Inpatient Hospital Stay (HOSPITAL_COMMUNITY): Payer: BLUE CROSS/BLUE SHIELD

## 2015-03-19 LAB — HEPARIN LEVEL (UNFRACTIONATED)
HEPARIN UNFRACTIONATED: 0.31 [IU]/mL (ref 0.30–0.70)
Heparin Unfractionated: 0.27 IU/mL — ABNORMAL LOW (ref 0.30–0.70)

## 2015-03-19 LAB — CBC
HCT: 40.1 % (ref 39.0–52.0)
HEMOGLOBIN: 12.3 g/dL — AB (ref 13.0–17.0)
MCH: 28.1 pg (ref 26.0–34.0)
MCHC: 30.7 g/dL (ref 30.0–36.0)
MCV: 91.8 fL (ref 78.0–100.0)
PLATELETS: 192 10*3/uL (ref 150–400)
RBC: 4.37 MIL/uL (ref 4.22–5.81)
RDW: 14.1 % (ref 11.5–15.5)
WBC: 6 10*3/uL (ref 4.0–10.5)

## 2015-03-19 LAB — GLUCOSE, CAPILLARY
GLUCOSE-CAPILLARY: 83 mg/dL (ref 65–99)
GLUCOSE-CAPILLARY: 108 mg/dL — AB (ref 65–99)
GLUCOSE-CAPILLARY: 75 mg/dL (ref 65–99)
Glucose-Capillary: 72 mg/dL (ref 65–99)
Glucose-Capillary: 88 mg/dL (ref 65–99)

## 2015-03-19 LAB — PROTIME-INR
INR: 1.45 (ref 0.00–1.49)
Prothrombin Time: 17.8 seconds — ABNORMAL HIGH (ref 11.6–15.2)

## 2015-03-19 MED ORDER — WARFARIN - PHARMACIST DOSING INPATIENT
Freq: Every day | Status: DC
  Administered 2015-03-20: 17:00:00
  Administered 2015-03-23: 1
  Administered 2015-03-24: 18:00:00

## 2015-03-19 MED ORDER — GABAPENTIN 300 MG PO CAPS
300.0000 mg | ORAL_CAPSULE | Freq: Two times a day (BID) | ORAL | Status: DC
  Administered 2015-03-19 – 2015-03-20 (×3): 300 mg via ORAL
  Filled 2015-03-19 (×3): qty 1

## 2015-03-19 MED ORDER — WARFARIN SODIUM 4 MG PO TABS
4.0000 mg | ORAL_TABLET | Freq: Once | ORAL | Status: AC
  Administered 2015-03-19: 4 mg via ORAL
  Filled 2015-03-19: qty 1

## 2015-03-19 NOTE — Progress Notes (Signed)
Pt places self on/off cpap.  Rt will continue to monitor. 

## 2015-03-19 NOTE — Progress Notes (Signed)
PATIENT DETAILS Name: [name] Age: 54 y.o. Sex: male Date of Birth: 1961-05-14 Admit Date: 03/16/2015 Admitting Physician Hillary Bow, DO ZOX:[id number],UJWJX N, MD  Subjective: Continues to have mild headaches. No other complaints-nonfocal exam.  Assessment/Plan: Principal Problem: SDH (subdural hematoma): Suspect secondary to syncope/seizures- while being on Coumadin. Although recommendations from neurosurgery are to hold Coumadin for 3 weeks, after discussion with cardiology-if needed could hold for 1 week but not longer. Currently on IV heparin-seems to be tolerating it well-nonfocal exam. Repeat CT head 10/10-no change in SDH-spoke with neurology-okay to restart Coumadin-have talked with pharmacy-Will slowly increase INR 2 keeping around 2. Will need very close monitoring in the outpatient setting. Spouse and patient agreeable with this plan, they're aware of this difficult situation and risks vs benefits, and elect on starting anticoagulation and continuing inpatient monitoring for the next few days. .   Active Problems: Syncope: Sustained on 10/4-without any prodromal symptoms. Patient was at work doing inventory-next thing he remembers is being surrounded by EMS. Per spouse Coworkers described seizure-like activity. Some question of hypoglycemia remains-on Victoza for weight loss without history of diabetes. Although could have seizures from SDH but suspect SDH was a result of syncope. Seen by cardiology,  no further recommendations-echocardiogram showed preserved ejection fraction. Suspect might need a outpatient implantable loop recorder-would defer to cardiology. a Telemetry continues to show some pauses and couplets of PACs. Okay to resume a beta blocker per cardiology.  S/P aortic valve replacement: With mechanical valve. See above.  Headache:? Postconcussion headache. Avoid NSAIDs-as on anticoagulation.spoke with neurology, recommendations are to start  Neurontin. Continue with as needed Tylenol.   Morbid obesity: Discontinue Victoza given question of hypoglycemia-further workup/treatment deferred to the outpatient setting  Dyslipidemia: Continue statin  Disposition: Remain inpatient-suspect will require several more days of hospitalization prior to discharge.  Antimicrobial agents  See below  Anti-infectives    None      DVT Prophylaxis:  SCD's  Code Status:  DNR  Family Communication Spouse at bedside  Procedures: None  CONSULTS:  cardiology, neurology and Neurosurgery  Time spent 25 minutes-Greater than 50% of this time was spent in counseling, explanation of diagnosis, planning of further management, and coordination of care.  MEDICATIONS: Scheduled Meds: . levETIRAcetam  500 mg Oral BID  . metoprolol succinate  50 mg Oral Daily  . rosuvastatin  10 mg Oral Daily   Continuous Infusions: . heparin 1,500 Units/hr (03/19/15 0840)   PRN Meds:.acetaminophen, traMADol    PHYSICAL EXAM: Vital signs in last 24 hours: Filed Vitals:   03/18/15 2204 03/19/15 0159 03/19/15 0531 03/19/15 0953  BP: 129/83 130/67 131/87 102/60  Pulse: 53 60 76 61  Temp: 98.3 F (36.8 C) 97.8 F (36.6 C) 96.6 F (35.9 C) 97.9 F (36.6 C)  TempSrc: Oral Oral Oral Oral  Resp: 16 18 20 20   Height:      Weight:      SpO2: 97% 96% 98% 100%    Weight change:  Filed Weights   03/16/15 1536  Weight: 145.605 kg (321 lb)   Body mass index is 41.2 kg/(m^2).   Gen Exam: Awake and alert with clear speech. Neck: Supple, No JVD.   Chest: B/L Clear.   CVS: S1 S2 Regular, no murmurs.  Abdomen: soft, BS +, non tender, non distended.  Extremities: no edema, lower extremities warm to touch. Neurologic: Non Focal.   Skin: No Rash.  Wounds: N/A.   Intake/Output from previous day:  Intake/Output Summary (Last 24 hours) at 03/19/15 1104 Last data filed at 03/19/15 0749  Gross per 24 hour  Intake    360 ml  Output      0 ml  Net     360 ml     LAB RESULTS: CBC  Recent Labs Lab 03/13/15 1743 03/16/15 1825 03/18/15 0644 03/19/15 0552  WBC 7.8 6.8 5.4 6.0  HGB 12.5* 12.3* 12.6* 12.3*  HCT 39.8 39.6 40.0 40.1  PLT 189 197 176 192  MCV 90.7 91.9 92.0 91.8  MCH 28.5 28.5 29.0 28.1  MCHC 31.4 31.1 31.5 30.7  RDW 13.9 14.2 14.3 14.1  LYMPHSABS  --  1.5  --   --   MONOABS  --  1.0  --   --   EOSABS  --  0.1  --   --   BASOSABS  --  0.0  --   --     Chemistries   Recent Labs Lab 03/13/15 1743 03/16/15 1825 03/18/15 0644  NA 140 138 144  K 3.5 3.8 3.6  CL 105 103 105  CO2 GLUCOSE 79 85 105*  BUN CREATININE 0.77 0.85 0.69  CALCIUM 9.0 8.8* 8.8*  MG  --   --  1.9    CBG:  Recent Labs Lab 03/18/15 0659 03/18/15 1116 03/18/15 1607 03/18/15 2201 03/19/15 0643  GLUCAP 109* 96 74 83 108*    GFR Estimated Creatinine Clearance: 156.9 mL/min (by C-G formula based on Cr of 0.69).  Coagulation profile  Recent Labs Lab 03/13/15 2051 03/16/15 1825 03/17/15 0835 03/18/15 0644 03/19/15 0552  INR 1.78* 1.83* 1.86* 1.64* 1.45    Cardiac Enzymes No results for input(s): CKMB, TROPONINI, MYOGLOBIN in the last 168 hours.  Invalid input(s): CK  Invalid input(s): POCBNP No results for input(s): DDIMER in the last 72 hours. No results for input(s): HGBA1C in the last 72 hours. No results for input(s): CHOL, HDL, LDLCALC, TRIG, CHOLHDL, LDLDIRECT in the last 72 hours. No results for input(s): TSH, T4TOTAL, T3FREE, THYROIDAB in the last 72 hours.  Invalid input(s): FREET3 No results for input(s): VITAMINB12, FOLATE, FERRITIN, TIBC, IRON, RETICCTPCT in the last 72 hours. No results for input(s): LIPASE, AMYLASE in the last 72 hours.  Urine Studies No results for input(s): UHGB, CRYS in the last 72 hours.  Invalid input(s): UACOL, UAPR, USPG, UPH, UTP, UGL, UKET, UBIL, UNIT, UROB, ULEU, UEPI, UWBC, URBC, UBAC, CAST, UCOM, BILUA  MICROBIOLOGY: No results found for this or  any previous visit (from the past 240 hour(s)).  RADIOLOGY STUDIES/RESULTS: Ct Head Wo Contrast  03/19/2015   CLINICAL DATA:  Subdural hematoma  EXAM: CT HEAD WITHOUT CONTRAST  TECHNIQUE: Contiguous axial images were obtained from the base of the skull through the vertex without intravenous contrast.  COMPARISON:  03/13/2015  FINDINGS: Skull and Sinuses:Diminished right frontal scalp swelling. No acute osseous finding.  Orbits: Right cataract resection.  No traumatic finding.  Brain: Isodense subdural hematoma along the left cerebral convexity, maximal at the high left frontal level is stable in size with maximal thickness of 7 mm. Midline shift is stable at 4 mm to the right. No ischemic changes. No new focus of intracranial hemorrhage. No hydrocephalus.  IMPRESSION: Unchanged isodense subdural hematoma around the left cerebral convexity with 4 mm midline shift.   Electronically Signed   By: Marnee Spring M.D.   On: 03/19/2015 07:07  Ct Head Wo Contrast  03/13/2015   CLINICAL DATA:  Head and neck pain.  EXAM: CT HEAD WITHOUT CONTRAST  CT CERVICAL SPINE WITHOUT CONTRAST  TECHNIQUE: Multidetector CT imaging of the head and cervical spine was performed following the standard protocol without intravenous contrast. Multiplanar CT image reconstructions of the cervical spine were also generated.  COMPARISON:  CT scan of head of May 30, 2014.  FINDINGS: CT HEAD FINDINGS  Mild right frontal scalp hematoma is noted. Bony calvarium appears intact. No mass effect or midline shift is noted. Ventricular size is within normal limits. There is no evidence of mass lesion, hemorrhage or acute infarction.  CT CERVICAL SPINE FINDINGS  No fracture or spondylolisthesis is noted. Moderate degenerative disc disease is noted at C5-6 and C6-7 with anterior osteophyte formation. Posterior facet joints appear intact. Visualized lung apices appear normal.  IMPRESSION: Mild right frontal scalp hematoma. No acute intracranial  abnormality seen.  Moderate degenerative disc disease is noted at C5-6 and C6-7. No acute abnormality seen in the cervical spine.   Electronically Signed   By: Lupita Raider, M.D.   On: 03/13/2015 18:18   Ct Cervical Spine Wo Contrast  03/13/2015   CLINICAL DATA:  Head and neck pain.  EXAM: CT HEAD WITHOUT CONTRAST  CT CERVICAL SPINE WITHOUT CONTRAST  TECHNIQUE: Multidetector CT imaging of the head and cervical spine was performed following the standard protocol without intravenous contrast. Multiplanar CT image reconstructions of the cervical spine were also generated.  COMPARISON:  CT scan of head of May 30, 2014.  FINDINGS: CT HEAD FINDINGS  Mild right frontal scalp hematoma is noted. Bony calvarium appears intact. No mass effect or midline shift is noted. Ventricular size is within normal limits. There is no evidence of mass lesion, hemorrhage or acute infarction.  CT CERVICAL SPINE FINDINGS  No fracture or spondylolisthesis is noted. Moderate degenerative disc disease is noted at C5-6 and C6-7 with anterior osteophyte formation. Posterior facet joints appear intact. Visualized lung apices appear normal.  IMPRESSION: Mild right frontal scalp hematoma. No acute intracranial abnormality seen.  Moderate degenerative disc disease is noted at C5-6 and C6-7. No acute abnormality seen in the cervical spine.   Electronically Signed   By: Lupita Raider, M.D.   On: 03/13/2015 18:18   Mr Brain Wo Contrast  03/16/2015   CLINICAL DATA:  Newly diagnosed seizures after a syncopal episode with fall. Headache. Visual field defect.  EXAM: MRI HEAD WITHOUT CONTRAST  TECHNIQUE: Multiplanar, multiecho pulse sequences of the brain and surrounding structures were obtained without intravenous contrast.  COMPARISON:  Head CT in 03/13/2015  FINDINGS: There is a small volume, likely subacute subdural hematoma over the left cerebral convexity, largest in the frontal region where it measures up to 6 mm in thickness. There is  mild left cerebral hemispheric sulcal effacement, and there is 4 mm of rightward midline shift. A small amount of subdural hemorrhage is also present in the interhemispheric fissure along the left aspect of the falx. There is no hydrocephalus. No acute infarct, parenchymal hemorrhage, or mass is seen.  The hippocampi are grossly symmetric in size and signal, however detailed evaluation is precluded by prominent motion artifact on thin section images through the temporal lobes. No significant white matter disease is seen. Cerebral volume is normal for age.  Prior right cataract extraction is noted. There is mild left maxillary sinus mucosal thickening. Mastoid air cells are clear. Major intracranial vascular flow voids are preserved.  IMPRESSION:  Small subdural hematoma over the left cerebral convexity and along the falx. 4 mm rightward midline shift.  Critical Value/emergent results were called by telephone at the time of interpretation on 03/16/2015 at 9:17 pm to Dr. Madilyn Hook, who verbally acknowledged these results.   Electronically Signed   By: Sebastian Ache M.D.   On: 03/16/2015 21:21    Jeoffrey Massed, MD  [facility] Pager:[phone]  If 7PM-7AM, please contact night-coverage [email or web address] Password [email or web address] 03/19/2015, 11:04 AM   LOS: 2 days

## 2015-03-19 NOTE — Progress Notes (Addendum)
   Some headache.  Rhythm is stable, NSR.  Echo with no evidence of valve thrombus or other problems  Valve sounds are crisp.  Agree with the plan as outlined by Dr. Jerral Ralph.  We will follow and advise as needed.

## 2015-03-19 NOTE — Progress Notes (Signed)
Subjective: No further seizures. No SE on Keppra. No HA today. Follow up CT showed no change. Up brushing teeth and preparing for the day  Objective: Current vital signs: BP 131/87 mmHg  Pulse 76  Temp(Src) 96.6 F (35.9 C) (Oral)  Resp 20  Ht  (1.88 m)  Wt 145.605 kg (321 lb)  BMI 41.20 kg/m2  SpO2 98% Vital signs in last 24 hours: Temp:  [96.6 F (35.9 C)-98.4 F (36.9 C)] 96.6 F (35.9 C) (10/10 0531) Pulse Rate:  [53-76] 76 (10/10 0531) Resp:  [16-20] 20 (10/10 0531) BP: (121-131)/(67-87) 131/87 mmHg (10/10 0531) SpO2:  [96 %-100 %] 98 % (10/10 0531)  Intake/Output from previous day:   Intake/Output this shift: Total I/O In: 360 [P.O.:360] Out: -  Nutritional status: Diet Carb Modified Fluid consistency:: Thin; Room service appropriate?: Yes  Neurologic Exam: General: NAD Mental Status: Alert, oriented, thought content appropriate.  Speech fluent without evidence of aphasia.  Able to follow 3 step commands without difficulty. Cranial Nerves: II:  Visual fields grossly normal, pupils equal, round, reactive to light and accommodation III,IV, VI: ptosis not present, extra-ocular motions intact bilaterally V,VII: smile symmetric, facial light touch sensation normal bilaterally VIII: hearing normal bilaterally IX,X: uvula rises symmetrically XI: bilateral shoulder shrug XII: midline tongue extension without atrophy or fasciculations  Motor: Right : Upper extremity   5/5    Left:     Upper extremity   5/5  Lower extremity   5/5     Lower extremity   5/5 Tone and bulk:normal tone throughout; no atrophy noted Sensory: Pinprick and light touch intact throughout, bilaterally Deep Tendon Reflexes:  Right: Upper Extremity   Left: Upper extremity   biceps (C-5 to C-6) 2/4   biceps (C-5 to C-6) 2/4 tricep (C7) 2/4    triceps (C7) 2/4 Brachioradialis (C6) 2/4  Brachioradialis (C6) 2/4  Lower Extremity Lower Extremity  quadriceps (L-2 to L-4) 2/4   quadriceps (L-2  to L-4) 2/4 Achilles (S1) 1/4   Achilles (S1) 1/4  Plantars: Right: downgoing   Left: downgoing    Lab Results: Basic Metabolic Panel:  Recent Labs Lab 03/13/15 1743 03/16/15 1825 03/18/15 0644  NA 140 138 144  K 3.5 3.8 3.6  CL 105 103 105  CO2 GLUCOSE 79 85 105*  BUN CREATININE 0.77 0.85 0.69  CALCIUM 9.0 8.8* 8.8*  MG  --   --  1.9    Liver Function Tests: No results for input(s): AST, ALT, ALKPHOS, BILITOT, PROT, ALBUMIN in the last 168 hours. No results for input(s): LIPASE, AMYLASE in the last 168 hours. No results for input(s): AMMONIA in the last 168 hours.  CBC:  Recent Labs Lab 03/13/15 1743 03/16/15 1825 03/18/15 0644 03/19/15 0552  WBC 7.8 6.8 5.4 6.0  NEUTROABS  --  4.2  --   --   HGB 12.5* 12.3* 12.6* 12.3*  HCT 39.8 39.6 40.0 40.1  MCV 90.7 91.9 92.0 91.8  PLT 189 197 176 192    Cardiac Enzymes: No results for input(s): CKTOTAL, CKMB, CKMBINDEX, TROPONINI in the last 168 hours.  Lipid Panel: No results for input(s): CHOL, TRIG, HDL, CHOLHDL, VLDL, LDLCALC in the last 168 hours.  CBG:  Recent Labs Lab 03/17/15 2220 03/18/15 0659 03/18/15 1116 03/18/15 1607 03/19/15 0643  GLUCAP 92 109* 96 74 108*    Microbiology: Results for orders placed or performed during the hospital encounter of 05/14/14  Urine culture  Status: None   Collection Time: 05/14/14  9:43 AM  Result Value Ref Range Status   Specimen Description URINE, RANDOM  Final   Special Requests NONE  Final   Culture  Setup Time   Final    05/14/2014 17:59 Performed at [facility] Count   Final    50,000 COLONIES/ML Performed at [facility]    Culture   Final    Multiple bacterial morphotypes present, none predominant. Suggest appropriate recollection if clinically indicated. Performed at [facility]    Report Status 05/15/2014 FINAL  Final    Coagulation Studies:  Recent Labs  03/16/15 1825  03/17/15 0835 03/18/15 0644 03/19/15 0552  LABPROT 21.1* 21.3* 19.5* 17.8*  INR 1.83* 1.86* 1.64* 1.45    Imaging: Ct Head Wo Contrast  03/19/2015   CLINICAL DATA:  Subdural hematoma  EXAM: CT HEAD WITHOUT CONTRAST  TECHNIQUE: Contiguous axial images were obtained from the base of the skull through the vertex without intravenous contrast.  COMPARISON:  03/13/2015  FINDINGS: Skull and Sinuses:Diminished right frontal scalp swelling. No acute osseous finding.  Orbits: Right cataract resection.  No traumatic finding.  Brain: Isodense subdural hematoma along the left cerebral convexity, maximal at the high left frontal level is stable in size with maximal thickness of 7 mm. Midline shift is stable at 4 mm to the right. No ischemic changes. No new focus of intracranial hemorrhage. No hydrocephalus.  IMPRESSION: Unchanged isodense subdural hematoma around the left cerebral convexity with 4 mm midline shift.   Electronically Signed   By: Marnee Spring M.D.   On: 03/19/2015 07:07    Medications:  Scheduled: . levETIRAcetam  500 mg Oral BID  . metoprolol succinate  50 mg Oral Daily  . rosuvastatin  10 mg Oral Daily    Assessment/Plan: 54 YO male with new onset GTC seizure. No further seizures while on Keppra. CT head today shows no interval change in SDH.    Recommend 1) as repeat head CT stable agree with plan to slowly increase INR to goal 2 2) Continue Keppra at current dose  3) Follow up with neurology as out patient.    Neurology will S/O  Felicie Morn PA-C [facility] [phone]  03/19/2015, 9:19 AM    Patient seen and evaluated. Agree with above assessment and plan. Agree with slowly increasing INR to goal of 2 due to risk of embolic event with mechanical heart valve.   Elspeth Cho, DO [facility] [phone]  If 7pm- 7am, please page neurology on call as listed in AMION.

## 2015-03-19 NOTE — Progress Notes (Addendum)
ANTICOAGULATION CONSULT NOTE - Follow Up Consult  Pharmacy Consult for Heparin Indication: mechanical AVR  Allergies  Allergen Reactions  . Nsaids Other (See Comments)    Told not to take meds-per patient    Patient Measurements: Height:  (188 cm) Weight: (!) 321 lb (145.605 kg) IBW/kg (Calculated) : 82.2 Heparin Dosing Weight: 116 kg  Vital Signs: Temp: 96.6 F (35.9 C) (10/10 0531) Temp Source: Oral (10/10 0531) BP: 131/87 mmHg (10/10 0531) Pulse Rate: 76 (10/10 0531)  Labs:  Recent Labs  03/16/15 1825 03/17/15 0835  03/18/15 0644 03/18/15 1410 03/18/15 2154 03/19/15 0552  HGB 12.3*  --   --  12.6*  --   --  12.3*  HCT 39.6  --   --  40.0  --   --  40.1  PLT 197  --   --  176  --   --  192  LABPROT 21.1* 21.3*  --  19.5*  --   --  17.8*  INR 1.83* 1.86*  --  1.64*  --   --  1.45  HEPARINUNFRC  --   --   < > 0.64 0.59 0.61 0.27*  CREATININE 0.85  --   --  0.69  --   --   --   < > = values in this interval not displayed.  Estimated Creatinine Clearance: 156.9 mL/min (by C-G formula based on Cr of 0.69).   Assessment: 54 y/o male on chronic warfarin for mechanical AVR with seizure activity and fall on 10/4. CT head revealed subacute SDH. Neurosurgery recommended holding Coumadin until repeat CT head in 3 weeks however Neurology concerned for risk of stroke being off warfarin for that long with mechanical AVR. Neurology discussed risk/benefit with patient and wife. Pharmacy consulted to manage IV heparin with NO BOLUS and low-therapeutic goal range.   Heparin level is now subtherapeutic (0.27) on 1400 units/hr.No infusion problems. Head CT unchanged this morning. CBC stable. INR down to 1.45.  Last Coumadin dose 03/15/15.  Home Coumadin regimen: 7.5 mg daily except 3.75 mg on Fridays.  Goal of Therapy:  Heparin level 0.3-0.5 units/ml Monitor platelets by anticoagulation protocol: Yes   Plan:    No heparin boluses with SDH   Increase heparin drip to 1500  units/hr   Heparin level ~6 hrs after rate change   Daily heparin level and CBC  Dennie Fetters, [place] Pager: [phone] 03/19/2015 8:46 AM   Addendum:   Heparin level this afternoon is 0.31 in 1500 units/hr.  At goal.   Coumadin to resume for target INR ~2.0.   INR 1.45 today, down from 1.83 on admit 10/7.  Last Coumadin dose 10/6 at home (7.5 mg).   Will resume conservatively with Coumadin 4 mg x 1 today.   Next heparin level, PT/INR and CBC in am.   Discussed with patient.  Nicolette Bang, RPh 03/19/2015 5:09 PM

## 2015-03-20 LAB — CBC
HEMATOCRIT: 38.9 % — AB (ref 39.0–52.0)
Hemoglobin: 12.2 g/dL — ABNORMAL LOW (ref 13.0–17.0)
MCH: 29 pg (ref 26.0–34.0)
MCHC: 31.4 g/dL (ref 30.0–36.0)
MCV: 92.6 fL (ref 78.0–100.0)
Platelets: 188 10*3/uL (ref 150–400)
RBC: 4.2 MIL/uL — AB (ref 4.22–5.81)
RDW: 14.3 % (ref 11.5–15.5)
WBC: 6 10*3/uL (ref 4.0–10.5)

## 2015-03-20 LAB — HEPARIN LEVEL (UNFRACTIONATED): Heparin Unfractionated: 0.39 IU/mL (ref 0.30–0.70)

## 2015-03-20 LAB — GLUCOSE, CAPILLARY
GLUCOSE-CAPILLARY: 88 mg/dL (ref 65–99)
Glucose-Capillary: 105 mg/dL — ABNORMAL HIGH (ref 65–99)
Glucose-Capillary: 103 mg/dL — ABNORMAL HIGH (ref 65–99)

## 2015-03-20 LAB — PROTIME-INR
INR: 1.32 (ref 0.00–1.49)
Prothrombin Time: 16.5 seconds — ABNORMAL HIGH (ref 11.6–15.2)

## 2015-03-20 MED ORDER — GABAPENTIN 300 MG PO CAPS
300.0000 mg | ORAL_CAPSULE | Freq: Three times a day (TID) | ORAL | Status: DC
  Administered 2015-03-20 – 2015-03-22 (×6): 300 mg via ORAL
  Filled 2015-03-20 (×6): qty 1

## 2015-03-20 MED ORDER — WARFARIN SODIUM 6 MG PO TABS
6.0000 mg | ORAL_TABLET | Freq: Once | ORAL | Status: AC
  Administered 2015-03-20: 6 mg via ORAL
  Filled 2015-03-20: qty 1

## 2015-03-20 NOTE — Progress Notes (Addendum)
   No loading/restarted Coumadin  Shooting for an INR at 2 or above before discontinuation of heparin.

## 2015-03-20 NOTE — Progress Notes (Signed)
ANTICOAGULATION CONSULT NOTE - Follow Up Consult  Pharmacy Consult for Heparin and Coumadin Indication: mechanical AVR  Allergies  Allergen Reactions  . Nsaids Other (See Comments)    Told not to take meds-per patient    Patient Measurements: Height:  (188 cm) Weight: (!) 321 lb (145.605 kg) IBW/kg (Calculated) : 82.2 Heparin Dosing Weight: 116 kg  Vital Signs: Temp: 98.2 F (36.8 C) (10/11 1154) Temp Source: Oral (10/11 1154) BP: 130/63 mmHg (10/11 1154) Pulse Rate: 48 (10/11 1154)  Labs:  Recent Labs  03/18/15 0644  03/19/15 0552 03/19/15 1441 03/20/15 0522  HGB 12.6*  --  12.3*  --  12.2*  HCT 40.0  --  40.1  --  38.9*  PLT 176  --  192  --  188  LABPROT 19.5*  --  17.8*  --  16.5*  INR 1.64*  --  1.45  --  1.32  HEPARINUNFRC 0.64  < > 0.27* 0.31 0.39  CREATININE 0.69  --   --   --   --   < > = values in this interval not displayed.  Estimated Creatinine Clearance: 156.9 mL/min (by C-G formula based on Cr of 0.69).   Assessment: 54 y/o male on chronic warfarin for mechanical AVR with seizure activity and fall on 10/4. CT head revealed subacute SDH. Neurosurgery recommended holding Coumadin until repeat CT head in 3 weeks however Neurology concerned for risk of stroke being off warfarin for that long with mechanical AVR. Neurology discussed risk/benefit with patient and wife. Pharmacy consulted to manage IV heparin with NO BOLUS and low-therapeutic goal range, and Coumadin also at low-therapeutic range.  Heparin level remains therapeutic (0.39) on 1500 units/hr. CBC stable. INR 1.83 on admit, down to 1.45 on 10/10, and Coumadin resumed conservatively with 4 mg on 03/19/15. INR down to 1.32.  Drop in INR not surprising, but hope for trend up by am.  Home Coumadin regimen: 7.5 mg daily except 3.75 mg on Fridays.   Last Coumadin dose prior to admission on 03/15/15.  Goal of Therapy:  Heparin level 0.3-0.5 units/ml INR ~2.0 Monitor platelets by  anticoagulation protocol: Yes   Plan:    No heparin boluses with SDH.   Continue heparin drip at 1500 units/hr.   Coumadin 6 mg x 1 today.   Coumadin to resume for target INR ~2.0.   Next heparin level, PT/INR and CBC in am.   Discussed with patient.  Dennie Fetters, [place] Pager: [phone] 03/20/2015 1:14 PM

## 2015-03-20 NOTE — Care Management Note (Signed)
Case Management Note  Patient Details  Name: [name] MRN: [id number] Date of Birth: December 20, 1960  Subjective/Objective:                    Action/Plan: Patient admitted with subdural hematoma. Pt lives at home with his wife. Pt is on chronic coumadin and will need follow up at discharge. Dr Jerral Ralph asked for patient to have an appointment at the end of the week at the [facility]. CM spoke with the patient and his wife to make sure they agreed with setting an appointment. CM arranged an appointment for Friday at 12:15 pm. CM informed the patient and his wife of the appointment time and date. Appointment also added to the AVS. CM will continue to follow for further discharge needs.   Expected Discharge Date:                  Expected Discharge Plan:  Home/Self Care  In-House Referral:     Discharge planning Services     Post Acute Care Choice:    Choice offered to:     DME Arranged:    DME Agency:     HH Arranged:    HH Agency:     Status of Service:  In process, will continue to follow  Medicare Important Message Given:    Date Medicare IM Given:    Medicare IM give by:    Date Additional Medicare IM Given:    Additional Medicare Important Message give by:     If discussed at Long Length of Stay Meetings, dates discussed:    Additional Comments:  Kermit Balo, RN 03/20/2015, 3:34 PM

## 2015-03-20 NOTE — Progress Notes (Addendum)
PATIENT DETAILS Name: [name] Age: 54 y.o. Sex: male Date of Birth: [birth year] Admit Date: 03/16/2015 Admitting Physician Hillary Bow, DO ZOX:[id number],UJWJX N, MD  Subjective: No major complaints-doing well-mild headaches continue.  Assessment/Plan: Principal Problem: SDH (subdural hematoma): Suspect secondary to syncope/seizures- while being on Coumadin. Although recommendations from neurosurgery were to hold Coumadin for 3 weeks, after discussion with cardiology-if needed could hold for 1 week but not longer. Since tolerating IV heparin infusion, and the repeat CT head on 10/10 without major changes-Coumadin restarted on 10/11. Plans are to dose Coumadin conservatively, and allow INR to slowly increase to 2 while inpatient. Will need close monitoring in the outpatient setting. Risks vs benefits of this plan was discussed with patient and spouse, was agreeable with the above noted plan.  Active Problems: Syncope: Sustained on 10/4-without any prodromal symptoms. Patient was at work doing inventory-next thing he remembers is being surrounded by EMS. Per spouse Coworkers described seizure-like activity. Some question of hypoglycemia remains-on Victoza for weight loss without history of diabetes. Although could have seizures from SDH but suspect SDH was a result of syncope. Seen by cardiology,  no further recommendations-echocardiogram showed preserved ejection fraction. Suspect might need a outpatient implantable loop recorder-would defer to cardiology. a Telemetry continues to show some pauses and couplets of PACs. Okay to resume a beta blocker per cardiology.  S/P aortic valve replacement: With mechanical valve. See above.  Headache:? Postconcussion headache. Avoid NSAIDs-as on anticoagulation.spoke with neurology, recommendations are to start Neurontin-continue to titrate dose.. Continue with as needed Tylenol.   Morbid obesity: Discontinue Victoza given  question of hypoglycemia-further workup/treatment deferred to the outpatient setting  Dyslipidemia: Continue statin  Disposition: Remain inpatient-suspect will require several more days of hospitalization prior to discharge.  Antimicrobial agents  See below  Anti-infectives    None      DVT Prophylaxis:  SCD's  Code Status:  DNR  Family Communication Spouse at bedside  Procedures: None  CONSULTS:  cardiology, neurology and Neurosurgery  Time spent 25 minutes-Greater than 50% of this time was spent in counseling, explanation of diagnosis, planning of further management, and coordination of care.  MEDICATIONS: Scheduled Meds: . gabapentin  300 mg Oral BID  . levETIRAcetam  500 mg Oral BID  . metoprolol succinate  50 mg Oral Daily  . rosuvastatin  10 mg Oral Daily  . Warfarin - Pharmacist Dosing Inpatient   Does not apply q1800   Continuous Infusions: . heparin 1,500 Units/hr (03/19/15 1557)   PRN Meds:.acetaminophen, traMADol    PHYSICAL EXAM: Vital signs in last 24 hours: Filed Vitals:   03/19/15 2350 03/20/15 0106 03/20/15 0530 03/20/15 0854  BP:  114/59 110/62 113/66  Pulse:  87 109 56  Temp:  98 F (36.7 C) 97.9 F (36.6 C) 98.4 F (36.9 C)  TempSrc:  Oral Oral Oral  Resp: 16 18 18    Height:      Weight:      SpO2:  94% 95% 98%    Weight change:  Filed Weights   03/16/15 1536  Weight: 145.605 kg (321 lb)   Body mass index is 41.2 kg/(m^2).   Gen Exam: Awake and alert with clear speech. Neck: Supple, No JVD.   Chest: B/L Clear.   CVS: S1 S2 Regular, no murmurs.  Abdomen: soft, BS +, non tender, non distended.  Extremities: no edema, lower extremities warm to touch. Neurologic: Non Focal.  Skin: No Rash.   Wounds: N/A.   Intake/Output from previous day:  Intake/Output Summary (Last 24 hours) at 03/20/15 0933 Last data filed at 03/19/15 1844  Gross per 24 hour  Intake    240 ml  Output      0 ml  Net    240 ml     LAB  RESULTS: CBC  Recent Labs Lab 03/13/15 1743 03/16/15 1825 03/18/15 0644 03/19/15 0552 03/20/15 0522  WBC 7.8 6.8 5.4 6.0 6.0  HGB 12.5* 12.3* 12.6* 12.3* 12.2*  HCT 39.8 39.6 40.0 40.1 38.9*  PLT 189 197 176 192 188  MCV 90.7 91.9 92.0 91.8 92.6  MCH 28.5 28.5 29.0 28.1 29.0  MCHC 31.4 31.1 31.5 30.7 31.4  RDW 13.9 14.2 14.3 14.1 14.3  LYMPHSABS  --  1.5  --   --   --   MONOABS  --  1.0  --   --   --   EOSABS  --  0.1  --   --   --   BASOSABS  --  0.0  --   --   --     Chemistries   Recent Labs Lab 03/13/15 1743 03/16/15 1825 03/18/15 0644  NA 140 138 144  K 3.5 3.8 3.6  CL 105 103 105  CO2 GLUCOSE 79 85 105*  BUN CREATININE 0.77 0.85 0.69  CALCIUM 9.0 8.8* 8.8*  MG  --   --  1.9    CBG:  Recent Labs Lab 03/19/15 0643 03/19/15 1133 03/19/15 1629 03/19/15 2127 03/20/15 0652  GLUCAP 108* 75 72 88 88    GFR Estimated Creatinine Clearance: 156.9 mL/min (by C-G formula based on Cr of 0.69).  Coagulation profile  Recent Labs Lab 03/16/15 1825 03/17/15 0835 03/18/15 0644 03/19/15 0552 03/20/15 0522  INR 1.83* 1.86* 1.64* 1.45 1.32    Cardiac Enzymes No results for input(s): CKMB, TROPONINI, MYOGLOBIN in the last 168 hours.  Invalid input(s): CK  Invalid input(s): POCBNP No results for input(s): DDIMER in the last 72 hours. No results for input(s): HGBA1C in the last 72 hours. No results for input(s): CHOL, HDL, LDLCALC, TRIG, CHOLHDL, LDLDIRECT in the last 72 hours. No results for input(s): TSH, T4TOTAL, T3FREE, THYROIDAB in the last 72 hours.  Invalid input(s): FREET3 No results for input(s): VITAMINB12, FOLATE, FERRITIN, TIBC, IRON, RETICCTPCT in the last 72 hours. No results for input(s): LIPASE, AMYLASE in the last 72 hours.  Urine Studies No results for input(s): UHGB, CRYS in the last 72 hours.  Invalid input(s): UACOL, UAPR, USPG, UPH, UTP, UGL, UKET, UBIL, UNIT, UROB, ULEU, UEPI, UWBC, URBC, UBAC, CAST, UCOM,  BILUA  MICROBIOLOGY: No results found for this or any previous visit (from the past 240 hour(s)).  RADIOLOGY STUDIES/RESULTS: Ct Head Wo Contrast  03/19/2015   CLINICAL DATA:  Subdural hematoma  EXAM: CT HEAD WITHOUT CONTRAST  TECHNIQUE: Contiguous axial images were obtained from the base of the skull through the vertex without intravenous contrast.  COMPARISON:  03/13/2015  FINDINGS: Skull and Sinuses:Diminished right frontal scalp swelling. No acute osseous finding.  Orbits: Right cataract resection.  No traumatic finding.  Brain: Isodense subdural hematoma along the left cerebral convexity, maximal at the high left frontal level is stable in size with maximal thickness of 7 mm. Midline shift is stable at 4 mm to the right. No ischemic changes. No new focus of intracranial hemorrhage. No hydrocephalus.  IMPRESSION: Unchanged isodense subdural hematoma  around the left cerebral convexity with 4 mm midline shift.   Electronically Signed   By: Marnee Spring M.D.   On: 03/19/2015 07:07   Ct Head Wo Contrast  03/13/2015   CLINICAL DATA:  Head and neck pain.  EXAM: CT HEAD WITHOUT CONTRAST  CT CERVICAL SPINE WITHOUT CONTRAST  TECHNIQUE: Multidetector CT imaging of the head and cervical spine was performed following the standard protocol without intravenous contrast. Multiplanar CT image reconstructions of the cervical spine were also generated.  COMPARISON:  CT scan of head of May 30, 2014.  FINDINGS: CT HEAD FINDINGS  Mild right frontal scalp hematoma is noted. Bony calvarium appears intact. No mass effect or midline shift is noted. Ventricular size is within normal limits. There is no evidence of mass lesion, hemorrhage or acute infarction.  CT CERVICAL SPINE FINDINGS  No fracture or spondylolisthesis is noted. Moderate degenerative disc disease is noted at C5-6 and C6-7 with anterior osteophyte formation. Posterior facet joints appear intact. Visualized lung apices appear normal.  IMPRESSION: Mild  right frontal scalp hematoma. No acute intracranial abnormality seen.  Moderate degenerative disc disease is noted at C5-6 and C6-7. No acute abnormality seen in the cervical spine.   Electronically Signed   By: Lupita Raider, M.D.   On: 03/13/2015 18:18   Ct Cervical Spine Wo Contrast  03/13/2015   CLINICAL DATA:  Head and neck pain.  EXAM: CT HEAD WITHOUT CONTRAST  CT CERVICAL SPINE WITHOUT CONTRAST  TECHNIQUE: Multidetector CT imaging of the head and cervical spine was performed following the standard protocol without intravenous contrast. Multiplanar CT image reconstructions of the cervical spine were also generated.  COMPARISON:  CT scan of head of May 30, 2014.  FINDINGS: CT HEAD FINDINGS  Mild right frontal scalp hematoma is noted. Bony calvarium appears intact. No mass effect or midline shift is noted. Ventricular size is within normal limits. There is no evidence of mass lesion, hemorrhage or acute infarction.  CT CERVICAL SPINE FINDINGS  No fracture or spondylolisthesis is noted. Moderate degenerative disc disease is noted at C5-6 and C6-7 with anterior osteophyte formation. Posterior facet joints appear intact. Visualized lung apices appear normal.  IMPRESSION: Mild right frontal scalp hematoma. No acute intracranial abnormality seen.  Moderate degenerative disc disease is noted at C5-6 and C6-7. No acute abnormality seen in the cervical spine.   Electronically Signed   By: Lupita Raider, M.D.   On: 03/13/2015 18:18   Mr Brain Wo Contrast  03/16/2015   CLINICAL DATA:  Newly diagnosed seizures after a syncopal episode with fall. Headache. Visual field defect.  EXAM: MRI HEAD WITHOUT CONTRAST  TECHNIQUE: Multiplanar, multiecho pulse sequences of the brain and surrounding structures were obtained without intravenous contrast.  COMPARISON:  Head CT in 03/13/2015  FINDINGS: There is a small volume, likely subacute subdural hematoma over the left cerebral convexity, largest in the frontal region  where it measures up to 6 mm in thickness. There is mild left cerebral hemispheric sulcal effacement, and there is 4 mm of rightward midline shift. A small amount of subdural hemorrhage is also present in the interhemispheric fissure along the left aspect of the falx. There is no hydrocephalus. No acute infarct, parenchymal hemorrhage, or mass is seen.  The hippocampi are grossly symmetric in size and signal, however detailed evaluation is precluded by prominent motion artifact on thin section images through the temporal lobes. No significant white matter disease is seen. Cerebral volume is normal for age.  Prior right cataract extraction is noted. There is mild left maxillary sinus mucosal thickening. Mastoid air cells are clear. Major intracranial vascular flow voids are preserved.  IMPRESSION: Small subdural hematoma over the left cerebral convexity and along the falx. 4 mm rightward midline shift.  Critical Value/emergent results were called by telephone at the time of interpretation on 03/16/2015 at 9:17 pm to Dr. Madilyn Hook, who verbally acknowledged these results.   Electronically Signed   By: Sebastian Ache M.D.   On: 03/16/2015 21:21    Jeoffrey Massed, MD  [facility] Pager:[phone]  If 7PM-7AM, please contact night-coverage [email or web address] Password [email or web address] 03/20/2015, 9:33 AM   LOS: 3 days

## 2015-03-21 LAB — GLUCOSE, CAPILLARY
GLUCOSE-CAPILLARY: 98 mg/dL (ref 65–99)
GLUCOSE-CAPILLARY: 108 mg/dL — AB (ref 65–99)
GLUCOSE-CAPILLARY: 104 mg/dL — AB (ref 65–99)
GLUCOSE-CAPILLARY: 84 mg/dL (ref 65–99)
Glucose-Capillary: 91 mg/dL (ref 65–99)
Glucose-Capillary: 99 mg/dL (ref 65–99)

## 2015-03-21 LAB — CBC
HEMATOCRIT: 38.2 % — AB (ref 39.0–52.0)
HEMOGLOBIN: 12 g/dL — AB (ref 13.0–17.0)
MCH: 29.2 pg (ref 26.0–34.0)
MCHC: 31.4 g/dL (ref 30.0–36.0)
MCV: 92.9 fL (ref 78.0–100.0)
Platelets: 176 10*3/uL (ref 150–400)
RBC: 4.11 MIL/uL — ABNORMAL LOW (ref 4.22–5.81)
RDW: 14.3 % (ref 11.5–15.5)
WBC: 5.9 10*3/uL (ref 4.0–10.5)

## 2015-03-21 LAB — HEPARIN LEVEL (UNFRACTIONATED): Heparin Unfractionated: 0.36 IU/mL (ref 0.30–0.70)

## 2015-03-21 LAB — PROTIME-INR
INR: 1.21 (ref 0.00–1.49)
PROTHROMBIN TIME: 15.5 s — AB (ref 11.6–15.2)

## 2015-03-21 MED ORDER — WARFARIN SODIUM 7.5 MG PO TABS
7.5000 mg | ORAL_TABLET | Freq: Once | ORAL | Status: AC
  Administered 2015-03-21: 7.5 mg via ORAL
  Filled 2015-03-21: qty 1

## 2015-03-21 NOTE — Progress Notes (Signed)
ANTICOAGULATION CONSULT NOTE - Follow Up Consult  Pharmacy Consult for Heparin and Coumadin Indication: mechanical AVR  Allergies  Allergen Reactions  . Nsaids Other (See Comments)    Told not to take meds-per patient    Patient Measurements: Height: 6\' 2"  (188 cm) Weight: (!) 321 lb (145.605 kg) IBW/kg (Calculated) : 82.2 Heparin Dosing Weight: 116 kg  Vital Signs: Temp: 98 F (36.7 C) (10/12 1424) Temp Source: Oral (10/12 1424) BP: 136/72 mmHg (10/12 1424) Pulse Rate: 68 (10/12 1424)  Labs:  Recent Labs  03/19/15 0552 03/19/15 1441 03/20/15 0522 03/21/15 0710  HGB 12.3*  --  12.2* 12.0*  HCT 40.1  --  38.9* 38.2*  PLT 192  --  188 176  LABPROT 17.8*  --  16.5* 15.5*  INR 1.45  --  1.32 1.21  HEPARINUNFRC 0.27* 0.31 0.39 0.36    Estimated Creatinine Clearance: 156.9 mL/min (by C-G formula based on Cr of 0.69).   Assessment: 54 y/o male on chronic warfarin for mechanical AVR with seizure activity and fall on 10/4. CT head revealed subacute SDH. Neurosurgery recommended holding Coumadin until repeat CT head in 3 weeks however Neurology concerned for risk of stroke being off warfarin for that long with mechanical AVR. Neurology discussed risk/benefit with patient and wife. Pharmacy consulted to manage IV heparin with NO BOLUS and low-therapeutic goal range, and Coumadin also at low-therapeutic range.  Heparin level remains therapeutic (0.36) on 1500 units/hr. CBC stable. INR 1.83 on admit, down to 1.45 on 10/10, and Coumadin resumed conservatively with 4 mg on 10/10 then 6 mg on 10/11. INR has continued to trend down, 1.21 today.  Home Coumadin regimen: 7.5 mg daily except 3.75 mg on Fridays.   Last Coumadin dose prior to admission on 03/15/15.  Goal of Therapy:  Heparin level 0.3-0.5 units/ml INR ~2.0 Monitor platelets by anticoagulation protocol: Yes   Plan:    No heparin boluses with SDH.   Continue heparin drip at 1500 units/hr.   Increase Coumadin to 7.5  mg today.   Coumadin to resume for target INR ~2.0.   Next heparin level, PT/INR and CBC in am.   Discussed with patient and wife.  Dennie Fettersgan, Jazariah Teall [place], [place] Pager: [phone] 03/21/2015 2:52 PM

## 2015-03-21 NOTE — Progress Notes (Signed)
PATIENT DETAILS Name: [name] Age: 54 y.o. Sex: male Date of Birth: 10-10-1960 Admit Date: 03/16/2015 Admitting Physician Hillary Bow, DO ZOX:[id number],UJWJX N, MD  Subjective: No major complaints  Assessment/Plan: Principal Problem: SDH (subdural hematoma): Suspect secondary to syncope/seizures- while being on Coumadin. Although recommendations from neurosurgery were to hold Coumadin for 3 weeks, after discussion with cardiology-if needed could hold for 1 week but not longer. Since tolerating IV heparin infusion, and the repeat CT head on 10/10 without major changes-Coumadin restarted on 10/11. Plans are to dose Coumadin conservatively, and allow INR to slowly increase to 2 while inpatient. INR still subtherapeutic-continue inpatient monitoring. Will need close monitoring in the outpatient setting. Risks vs benefits of this plan was discussed with patient and spouse, was agreeable with the above noted plan.  Active Problems: Syncope: Sustained on 10/4-without any prodromal symptoms. Patient was at work doing inventory-next thing he remembers is being surrounded by EMS. Per spouse Coworkers described seizure-like activity. Some question of hypoglycemia remains-on Victoza for weight loss without history of diabetes. Although could have seizures from SDH but suspect SDH was a result of syncope. Seen by cardiology,  no further recommendations-echocardiogram showed preserved ejection fraction. Suspect might need a outpatient implantable loop recorder-would defer to cardiology.    S/P aortic valve replacement: With mechanical valve. See above.  Headache:? Postconcussion headache. Avoid NSAIDs-as on anticoagulation.spoke with neurology, recommendations are to start Neurontin-continue to titrate dose.. Continue with as needed Tylenol.   Morbid obesity: Discontinue Victoza given question of hypoglycemia-further workup/treatment deferred to the outpatient  setting  Dyslipidemia: Continue statin  Disposition: Remain inpatient-suspect will require several more days of hospitalization prior to discharge.  Antimicrobial agents  See below  Anti-infectives    None      DVT Prophylaxis:  SCD's  Code Status:  DNR  Family Communication Spouse at bedside  Procedures: None  CONSULTS:  cardiology, neurology and Neurosurgery  Time spent 25 minutes-Greater than 50% of this time was spent in counseling, explanation of diagnosis, planning of further management, and coordination of care.  MEDICATIONS: Scheduled Meds: . gabapentin  300 mg Oral TID  . levETIRAcetam  500 mg Oral BID  . metoprolol succinate  50 mg Oral Daily  . rosuvastatin  10 mg Oral Daily  . Warfarin - Pharmacist Dosing Inpatient   Does not apply q1800   Continuous Infusions: . heparin 1,500 Units/hr (03/21/15 0445)   PRN Meds:.acetaminophen, traMADol    PHYSICAL EXAM: Vital signs in last 24 hours: Filed Vitals:   03/21/15 0320 03/21/15 0354 03/21/15 0632 03/21/15 1017  BP: 119/62 143/90 117/66 114/61  Pulse: 79 85 86 93  Temp: 98.1 F (36.7 C)  98 F (36.7 C) 97.9 F (36.6 C)  TempSrc: Oral  Oral Oral  Resp: 20  20 17   Height:      Weight:      SpO2: 96% 98% 96% 99%    Weight change:  Filed Weights   03/16/15 1536  Weight: 145.605 kg (321 lb)   Body mass index is 41.2 kg/(m^2).   Gen Exam: Awake and alert with clear speech. Neck: Supple, No JVD.   Chest: B/L Clear.   CVS: S1 S2 Regular, no murmurs.  Abdomen: soft, BS +, non tender, non distended.  Extremities: no edema, lower extremities warm to touch. Neurologic: Non Focal.   Skin: No Rash.   Wounds: N/A.   Intake/Output from previous day:  No intake or output data in the 24 hours ending 03/21/15 1337   LAB RESULTS: CBC  Recent Labs Lab 03/16/15 1825 03/18/15 0644 03/19/15 0552 03/20/15 0522 03/21/15 0710  WBC 6.8 5.4 6.0 6.0 5.9  HGB 12.3* 12.6* 12.3* 12.2* 12.0*  HCT  39.6 40.0 40.1 38.9* 38.2*  PLT 197 176 192 188 176  MCV 91.9 92.0 91.8 92.6 92.9  MCH 28.5 29.0 28.1 29.0 29.2  MCHC 31.1 31.5 30.7 31.4 31.4  RDW 14.2 14.3 14.1 14.3 14.3  LYMPHSABS 1.5  --   --   --   --   MONOABS 1.0  --   --   --   --   EOSABS 0.1  --   --   --   --   BASOSABS 0.0  --   --   --   --     Chemistries   Recent Labs Lab 03/16/15 1825 03/18/15 0644  NA 138 144  K 3.8 3.6  CL 103 105  CO2 28 30  GLUCOSE 85 105*  BUN 9 8  CREATININE 0.85 0.69  CALCIUM 8.8* 8.8*  MG  --  1.9    CBG:  Recent Labs Lab 03/20/15 1628 03/20/15 2143 03/21/15 0354 03/21/15 0628 03/21/15 1153  GLUCAP 105* 103* 98 108* 99    GFR Estimated Creatinine Clearance: 156.9 mL/min (by C-G formula based on Cr of 0.69).  Coagulation profile  Recent Labs Lab 03/17/15 0835 03/18/15 0644 03/19/15 0552 03/20/15 0522 03/21/15 0710  INR 1.86* 1.64* 1.45 1.32 1.21    Cardiac Enzymes No results for input(s): CKMB, TROPONINI, MYOGLOBIN in the last 168 hours.  Invalid input(s): CK  Invalid input(s): POCBNP No results for input(s): DDIMER in the last 72 hours. No results for input(s): HGBA1C in the last 72 hours. No results for input(s): CHOL, HDL, LDLCALC, TRIG, CHOLHDL, LDLDIRECT in the last 72 hours. No results for input(s): TSH, T4TOTAL, T3FREE, THYROIDAB in the last 72 hours.  Invalid input(s): FREET3 No results for input(s): VITAMINB12, FOLATE, FERRITIN, TIBC, IRON, RETICCTPCT in the last 72 hours. No results for input(s): LIPASE, AMYLASE in the last 72 hours.  Urine Studies No results for input(s): UHGB, CRYS in the last 72 hours.  Invalid input(s): UACOL, UAPR, USPG, UPH, UTP, UGL, UKET, UBIL, UNIT, UROB, ULEU, UEPI, UWBC, URBC, UBAC, CAST, UCOM, BILUA  MICROBIOLOGY: No results found for this or any previous visit (from the past 240 hour(s)).  RADIOLOGY STUDIES/RESULTS: Ct Head Wo Contrast  03/19/2015  CLINICAL DATA:  Subdural hematoma EXAM: CT HEAD WITHOUT  CONTRAST TECHNIQUE: Contiguous axial images were obtained from the base of the skull through the vertex without intravenous contrast. COMPARISON:  03/13/2015 FINDINGS: Skull and Sinuses:Diminished right frontal scalp swelling. No acute osseous finding. Orbits: Right cataract resection.  No traumatic finding. Brain: Isodense subdural hematoma along the left cerebral convexity, maximal at the high left frontal level is stable in size with maximal thickness of 7 mm. Midline shift is stable at 4 mm to the right. No ischemic changes. No new focus of intracranial hemorrhage. No hydrocephalus. IMPRESSION: Unchanged isodense subdural hematoma around the left cerebral convexity with 4 mm midline shift. Electronically Signed   By: Marnee SpringJonathon  Watts M.D.   On: 03/19/2015 07:07   Ct Head Wo Contrast  03/13/2015  CLINICAL DATA:  Head and neck pain. EXAM: CT HEAD WITHOUT CONTRAST CT CERVICAL SPINE WITHOUT CONTRAST TECHNIQUE: Multidetector CT imaging of the head and cervical spine was performed following the standard protocol without  intravenous contrast. Multiplanar CT image reconstructions of the cervical spine were also generated. COMPARISON:  CT scan of head of May 30, 2014. FINDINGS: CT HEAD FINDINGS Mild right frontal scalp hematoma is noted. Bony calvarium appears intact. No mass effect or midline shift is noted. Ventricular size is within normal limits. There is no evidence of mass lesion, hemorrhage or acute infarction. CT CERVICAL SPINE FINDINGS No fracture or spondylolisthesis is noted. Moderate degenerative disc disease is noted at C5-6 and C6-7 with anterior osteophyte formation. Posterior facet joints appear intact. Visualized lung apices appear normal. IMPRESSION: Mild right frontal scalp hematoma. No acute intracranial abnormality seen. Moderate degenerative disc disease is noted at C5-6 and C6-7. No acute abnormality seen in the cervical spine. Electronically Signed   By: Lupita Raider, M.D.   On:  03/13/2015 18:18   Ct Cervical Spine Wo Contrast  03/13/2015  CLINICAL DATA:  Head and neck pain. EXAM: CT HEAD WITHOUT CONTRAST CT CERVICAL SPINE WITHOUT CONTRAST TECHNIQUE: Multidetector CT imaging of the head and cervical spine was performed following the standard protocol without intravenous contrast. Multiplanar CT image reconstructions of the cervical spine were also generated. COMPARISON:  CT scan of head of May 30, 2014. FINDINGS: CT HEAD FINDINGS Mild right frontal scalp hematoma is noted. Bony calvarium appears intact. No mass effect or midline shift is noted. Ventricular size is within normal limits. There is no evidence of mass lesion, hemorrhage or acute infarction. CT CERVICAL SPINE FINDINGS No fracture or spondylolisthesis is noted. Moderate degenerative disc disease is noted at C5-6 and C6-7 with anterior osteophyte formation. Posterior facet joints appear intact. Visualized lung apices appear normal. IMPRESSION: Mild right frontal scalp hematoma. No acute intracranial abnormality seen. Moderate degenerative disc disease is noted at C5-6 and C6-7. No acute abnormality seen in the cervical spine. Electronically Signed   By: Lupita Raider, M.D.   On: 03/13/2015 18:18   Mr Brain Wo Contrast  03/16/2015  CLINICAL DATA:  Newly diagnosed seizures after a syncopal episode with fall. Headache. Visual field defect. EXAM: MRI HEAD WITHOUT CONTRAST TECHNIQUE: Multiplanar, multiecho pulse sequences of the brain and surrounding structures were obtained without intravenous contrast. COMPARISON:  Head CT in 03/13/2015 FINDINGS: There is a small volume, likely subacute subdural hematoma over the left cerebral convexity, largest in the frontal region where it measures up to 6 mm in thickness. There is mild left cerebral hemispheric sulcal effacement, and there is 4 mm of rightward midline shift. A small amount of subdural hemorrhage is also present in the interhemispheric fissure along the left aspect of  the falx. There is no hydrocephalus. No acute infarct, parenchymal hemorrhage, or mass is seen. The hippocampi are grossly symmetric in size and signal, however detailed evaluation is precluded by prominent motion artifact on thin section images through the temporal lobes. No significant white matter disease is seen. Cerebral volume is normal for age. Prior right cataract extraction is noted. There is mild left maxillary sinus mucosal thickening. Mastoid air cells are clear. Major intracranial vascular flow voids are preserved. IMPRESSION: Small subdural hematoma over the left cerebral convexity and along the falx. 4 mm rightward midline shift. Critical Value/emergent results were called by telephone at the time of interpretation on 03/16/2015 at 9:17 pm to Dr. Madilyn Hook, who verbally acknowledged these results. Electronically Signed   By: Sebastian Ache M.D.   On: 03/16/2015 21:21    Jeoffrey Massed, MD  [facility] Pager:[phone]  If 7PM-7AM, please contact night-coverage [email or web address] Password [email or web address]  03/21/2015, 1:37 PM   LOS: 4 days

## 2015-03-21 NOTE — Progress Notes (Signed)
   INR still low.

## 2015-03-21 NOTE — Progress Notes (Signed)
Patient CPAP set up on 8. NO O2 bleed in is needed. Patient states he does not need any assistance in putting on mask. RT will continue to monitor as needed.

## 2015-03-21 NOTE — Progress Notes (Signed)
While ambulating to the bathroom patient stated that he felt "woozy" so he was assisted to the chair.  Vitals and cbg obtained, see flowsheet. After resting and a snack pt felt that he felt much better.  Assisted back to bed. Will continue to monitor.

## 2015-03-21 NOTE — Progress Notes (Signed)
Patient places self on and off CPAP. Water chamber filled. RT told patient to call if needing any assistance or help

## 2015-03-21 NOTE — Care Management Note (Signed)
Case Management Note  Patient Details  Name: [name] MRN: [id number] Date of Birth: 04/14/1961  Subjective/Objective:    54 y.o. [name] admitted with SDH, continues on IV Heparin as well as Coumadin. INR continues below goal of (2.0)  1.32/1.21 which is desired as pt has hx of Chronic Coumadin use for AVR. Some dizziness early am 03/21/15. Will continue to follow.                Action/Plan:Follow up appt with Coumadin clinic for Friday 03/23/15. (see AVS)   Expected Discharge Date:                  Expected Discharge Plan:  Home/Self Care  In-House Referral:     Discharge planning Services  Follow-up appt scheduled  Post Acute Care Choice:    Choice offered to:     DME Arranged:    DME Agency:     HH Arranged:    HH Agency:     Status of Service:  In process, will continue to follow  Medicare Important Message Given:    Date Medicare IM Given:    Medicare IM give by:    Date Additional Medicare IM Given:    Additional Medicare Important Message give by:     If discussed at Long Length of Stay Meetings, dates discussed:    Additional Comments:  [name], Lance Yeatts M, RN 03/21/2015, 3:16 PM

## 2015-03-22 ENCOUNTER — Inpatient Hospital Stay (HOSPITAL_COMMUNITY): Payer: BLUE CROSS/BLUE SHIELD

## 2015-03-22 DIAGNOSIS — Z7901 Long term (current) use of anticoagulants: Secondary | ICD-10-CM

## 2015-03-22 DIAGNOSIS — I6200 Nontraumatic subdural hemorrhage, unspecified: Secondary | ICD-10-CM

## 2015-03-22 DIAGNOSIS — Z954 Presence of other heart-valve replacement: Secondary | ICD-10-CM

## 2015-03-22 LAB — GLUCOSE, CAPILLARY
GLUCOSE-CAPILLARY: 91 mg/dL (ref 65–99)
GLUCOSE-CAPILLARY: 85 mg/dL (ref 65–99)
Glucose-Capillary: 89 mg/dL (ref 65–99)
Glucose-Capillary: 121 mg/dL — ABNORMAL HIGH (ref 65–99)

## 2015-03-22 LAB — HEPARIN LEVEL (UNFRACTIONATED): HEPARIN UNFRACTIONATED: 0.3 [IU]/mL (ref 0.30–0.70)

## 2015-03-22 LAB — CBC
HCT: 39.4 % (ref 39.0–52.0)
Hemoglobin: 11.9 g/dL — ABNORMAL LOW (ref 13.0–17.0)
MCH: 28 pg (ref 26.0–34.0)
MCHC: 30.2 g/dL (ref 30.0–36.0)
MCV: 92.7 fL (ref 78.0–100.0)
Platelets: 178 10*3/uL (ref 150–400)
RBC: 4.25 MIL/uL (ref 4.22–5.81)
RDW: 14.4 % (ref 11.5–15.5)
WBC: 6.1 10*3/uL (ref 4.0–10.5)

## 2015-03-22 LAB — PROTIME-INR
INR: 1.19 (ref 0.00–1.49)
PROTHROMBIN TIME: 15.3 s — AB (ref 11.6–15.2)

## 2015-03-22 MED ORDER — GABAPENTIN 400 MG PO CAPS
400.0000 mg | ORAL_CAPSULE | Freq: Three times a day (TID) | ORAL | Status: DC
  Administered 2015-03-22 – 2015-03-28 (×18): 400 mg via ORAL
  Filled 2015-03-22 (×18): qty 1

## 2015-03-22 MED ORDER — ACETAMINOPHEN 500 MG PO TABS
1000.0000 mg | ORAL_TABLET | Freq: Three times a day (TID) | ORAL | Status: DC
  Administered 2015-03-22 – 2015-03-28 (×17): 1000 mg via ORAL
  Filled 2015-03-22 (×17): qty 2

## 2015-03-22 MED ORDER — WARFARIN SODIUM 7.5 MG PO TABS
7.5000 mg | ORAL_TABLET | Freq: Once | ORAL | Status: AC
  Administered 2015-03-22: 7.5 mg via ORAL
  Filled 2015-03-22: qty 1

## 2015-03-22 NOTE — Progress Notes (Signed)
PATIENT DETAILS Name: [name] Age: 54 y.o. Sex: male Date of Birth: [birth year] Admit Date: 03/16/2015 Admitting Physician Hillary Bow, DO ZOX:[id number],UJWJX N, MD  Brief Narrative Patient is a 54 y.o. male with h/o AVR with mechanical valve on chronic coumadin-who presented with worsening headaches and ?seizure like activity. Further evaluation revealed small SDH. Coumadin initially held-Neurology, Neurosurgery and Cardiology were consulted. Started on Keppra-although Neurosurgery recommended holding coumadin for 3 weeks-after d/w Cards/Neurology-this was felt to be to high of a risk due to mech aortic valve. Once repeat CT head demonstrated no worsening in SDH-Heparin infusion and overlapping coumadin was started, with plans to conservatively dose coumadin to allow for a gradual rise in INR. See below for further details.  Subjective: Continues to have headaches. Complaining of tingling/numbness mostly on left side (note SDH left side)  Assessment/Plan: Principal Problem: SDH (subdural hematoma): Suspect secondary to syncope/seizures- while being on Coumadin. Although recommendations from neurosurgery were to hold Coumadin for 3 weeks, after discussion with cardiology-if needed could hold for 1 week but not longer. Since tolerating IV heparin infusion, and the repeat CT head on 10/10 without major changes-Coumadin restarted on 10/11.Doing well-repeat CT head on 10/13 done on 10/3 due to left sided tingling numbess-shows no major changes. Continue with plans to dose Coumadin conservatively, and allow INR to slowly increase to 2 while inpatient.  Will need close monitoring in the outpatient setting. Risks vs benefits of this plan was discussed with patient and spouse, was agreeable with the above noted plan.  Active Problems: Syncope: Sustained on 10/4-without any prodromal symptoms. 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 Per spouse Coworkers described seizure-like activity. Some question of hypoglycemia remains-on Victoza for weight loss without history of diabetes. Although could have seizures from SDH but suspect SDH was a result of syncope. Seen by cardiology,  no further recommendations-echocardiogram showed preserved ejection fraction. Suspect might need a outpatient implantable loop recorder-would defer to cardiology.    S/P aortic valve replacement: With mechanical valve. See above.  Headache:? Postconcussion headache. Avoid NSAIDs-as on anticoagulation.spoke with neurology, recommendations are to start Neurontin-continue to titrate dose.Start scheduled Tylenol. Prn Ultram  Morbid obesity: Discontinue Victoza given question of hypoglycemia-further workup/treatment deferred to the outpatient setting  Dyslipidemia: Continue statin  Disposition: Remain inpatient-suspect will require several more days of hospitalization prior to discharge.  Antimicrobial agents  See below  Anti-infectives    None      DVT Prophylaxis:  SCD's  Code Status:  DNR  Family Communication Spouse at bedside  Procedures: None  CONSULTS:  cardiology, neurology and Neurosurgery  Time spent 25 minutes-Greater than 50% of this time was spent in counseling, explanation of diagnosis, planning of further management, and coordination of care.  MEDICATIONS: Scheduled Meds: . gabapentin  300 mg Oral TID  . levETIRAcetam  500 mg Oral BID  . metoprolol succinate  50 mg Oral Daily  . rosuvastatin  10 mg Oral Daily  . Warfarin - Pharmacist Dosing Inpatient   Does not apply q1800   Continuous Infusions: . heparin 1,500 Units/hr (03/21/15 2248)   PRN Meds:.acetaminophen, traMADol    PHYSICAL EXAM: Vital signs in last 24 hours: Filed Vitals:   03/21/15 2129 03/22/15 0128 03/22/15 0545 03/22/15 0800  BP: 141/77 116/44 120/61 122/63  Pulse: 87 61 87 56  Temp: 97.5 F (36.4 C) 97.7 F (36.5 C) 97.7  F (36.5  C) 98.1 F (36.7 C)  TempSrc: Oral Oral Oral Oral  Resp: 20 16 18 20   Height:      Weight:      SpO2: 99% 97% 95% 100%    Weight change:  Filed Weights   03/16/15 1536  Weight: 145.605 kg (321 lb)   Body mass index is 41.2 kg/(m^2).   Gen Exam: Awake and alert with clear speech. Neck: Supple, No JVD.   Chest: B/L Clear.   CVS: S1 S2 Regular, no murmurs.  Abdomen: soft, BS +, non tender, non distended.  Extremities: no edema, lower extremities warm to touch. Neurologic: Non Focal.   Skin: No Rash.   Wounds: N/A.   Intake/Output from previous day: No intake or output data in the 24 hours ending 03/22/15 1321   LAB RESULTS: CBC  Recent Labs Lab 03/16/15 1825 03/18/15 0644 03/19/15 0552 03/20/15 0522 03/21/15 0710 03/22/15 0537  WBC 6.8 5.4 6.0 6.0 5.9 6.1  HGB 12.3* 12.6* 12.3* 12.2* 12.0* 11.9*  HCT 39.6 40.0 40.1 38.9* 38.2* 39.4  PLT 197 176 192 188 176 178  MCV 91.9 92.0 91.8 92.6 92.9 92.7  MCH 28.5 29.0 28.1 29.0 29.2 28.0  MCHC 31.1 31.5 30.7 31.4 31.4 30.2  RDW 14.2 14.3 14.1 14.3 14.3 14.4  LYMPHSABS 1.5  --   --   --   --   --   MONOABS 1.0  --   --   --   --   --   EOSABS 0.1  --   --   --   --   --   BASOSABS 0.0  --   --   --   --   --     Chemistries   Recent Labs Lab 03/16/15 1825 03/18/15 0644  NA 138 144  K 3.8 3.6  CL 103 105  CO2 28 30  GLUCOSE 85 105*  BUN 9 8  CREATININE 0.85 0.69  CALCIUM 8.8* 8.8*  MG  --  1.9    CBG:  Recent Labs Lab 03/21/15 1153 03/21/15 1649 03/21/15 2128 03/22/15 0642 03/22/15 1131  GLUCAP 99 104* 84 89 91    GFR Estimated Creatinine Clearance: 156.9 mL/min (by C-G formula based on Cr of 0.69).  Coagulation profile  Recent Labs Lab 03/18/15 0644 03/19/15 0552 03/20/15 0522 03/21/15 0710 03/22/15 0537  INR 1.64* 1.45 1.32 1.21 1.19    Cardiac Enzymes No results for input(s): CKMB, TROPONINI, MYOGLOBIN in the last 168 hours.  Invalid input(s): CK  Invalid input(s):  POCBNP No results for input(s): DDIMER in the last 72 hours. No results for input(s): HGBA1C in the last 72 hours. No results for input(s): CHOL, HDL, LDLCALC, TRIG, CHOLHDL, LDLDIRECT in the last 72 hours. No results for input(s): TSH, T4TOTAL, T3FREE, THYROIDAB in the last 72 hours.  Invalid input(s): FREET3 No results for input(s): VITAMINB12, FOLATE, FERRITIN, TIBC, IRON, RETICCTPCT in the last 72 hours. No results for input(s): LIPASE, AMYLASE in the last 72 hours.  Urine Studies No results for input(s): UHGB, CRYS in the last 72 hours.  Invalid input(s): UACOL, UAPR, USPG, UPH, UTP, UGL, UKET, UBIL, UNIT, UROB, ULEU, UEPI, UWBC, URBC, UBAC, CAST, UCOM, BILUA  MICROBIOLOGY: No results found for this or any previous visit (from the past 240 hour(s)).  RADIOLOGY STUDIES/RESULTS: Ct Head Wo Contrast  03/22/2015  CLINICAL DATA:  Numbness on the left side of body with generalized headache. Subdural hematoma. EXAM: CT HEAD WITHOUT CONTRAST TECHNIQUE: Contiguous  axial images were obtained from the base of the skull through the vertex without intravenous contrast. COMPARISON:  Three days ago FINDINGS: Skull and Sinuses:Mild mucosal edema in the paranasal sinuses. Orbits: Right cataract resection. Brain: The isodense left subdural hematoma is difficult to visualize. No progressive mass effect or new high density to suggest interval hemorrhage. No evidence of infarct, hydrocephalus, mass lesion. IMPRESSION: Known small left cerebral convexity subdural hematoma is isodense and difficult visualize. No interval increase or new abnormality. Electronically Signed   By: Marnee SpringJonathon  Watts M.D.   On: 03/22/2015 10:00   Ct Head Wo Contrast  03/19/2015  CLINICAL DATA:  Subdural hematoma EXAM: CT HEAD WITHOUT CONTRAST TECHNIQUE: Contiguous axial images were obtained from the base of the skull through the vertex without intravenous contrast. COMPARISON:  03/13/2015 FINDINGS: Skull and Sinuses:Diminished right  frontal scalp swelling. No acute osseous finding. Orbits: Right cataract resection.  No traumatic finding. Brain: Isodense subdural hematoma along the left cerebral convexity, maximal at the high left frontal level is stable in size with maximal thickness of 7 mm. Midline shift is stable at 4 mm to the right. No ischemic changes. No new focus of intracranial hemorrhage. No hydrocephalus. IMPRESSION: Unchanged isodense subdural hematoma around the left cerebral convexity with 4 mm midline shift. Electronically Signed   By: Marnee SpringJonathon  Watts M.D.   On: 03/19/2015 07:07   Ct Head Wo Contrast  03/13/2015  CLINICAL DATA:  Head and neck pain. EXAM: CT HEAD WITHOUT CONTRAST CT CERVICAL SPINE WITHOUT CONTRAST TECHNIQUE: Multidetector CT imaging of the head and cervical spine was performed following the standard protocol without intravenous contrast. Multiplanar CT image reconstructions of the cervical spine were also generated. COMPARISON:  CT scan of head of May 30, 2014. FINDINGS: CT HEAD FINDINGS Mild right frontal scalp hematoma is noted. Bony calvarium appears intact. No mass effect or midline shift is noted. Ventricular size is within normal limits. There is no evidence of mass lesion, hemorrhage or acute infarction. CT CERVICAL SPINE FINDINGS No fracture or spondylolisthesis is noted. Moderate degenerative disc disease is noted at C5-6 and C6-7 with anterior osteophyte formation. Posterior facet joints appear intact. Visualized lung apices appear normal. IMPRESSION: Mild right frontal scalp hematoma. No acute intracranial abnormality seen. Moderate degenerative disc disease is noted at C5-6 and C6-7. No acute abnormality seen in the cervical spine. Electronically Signed   By: Lupita RaiderJames  Green Jr, M.D.   On: 03/13/2015 18:18   Ct Cervical Spine Wo Contrast  03/13/2015  CLINICAL DATA:  Head and neck pain. EXAM: CT HEAD WITHOUT CONTRAST CT CERVICAL SPINE WITHOUT CONTRAST TECHNIQUE: Multidetector CT imaging of the  head and cervical spine was performed following the standard protocol without intravenous contrast. Multiplanar CT image reconstructions of the cervical spine were also generated. COMPARISON:  CT scan of head of May 30, 2014. FINDINGS: CT HEAD FINDINGS Mild right frontal scalp hematoma is noted. Bony calvarium appears intact. No mass effect or midline shift is noted. Ventricular size is within normal limits. There is no evidence of mass lesion, hemorrhage or acute infarction. CT CERVICAL SPINE FINDINGS No fracture or spondylolisthesis is noted. Moderate degenerative disc disease is noted at C5-6 and C6-7 with anterior osteophyte formation. Posterior facet joints appear intact. Visualized lung apices appear normal. IMPRESSION: Mild right frontal scalp hematoma. No acute intracranial abnormality seen. Moderate degenerative disc disease is noted at C5-6 and C6-7. No acute abnormality seen in the cervical spine. Electronically Signed   By: Lupita RaiderJames  Green Jr, M.D.  On: 03/13/2015 18:18   Mr Brain Wo Contrast  03/16/2015  CLINICAL DATA:  Newly diagnosed seizures after a syncopal episode with fall. Headache. Visual field defect. EXAM: MRI HEAD WITHOUT CONTRAST TECHNIQUE: Multiplanar, multiecho pulse sequences of the brain and surrounding structures were obtained without intravenous contrast. COMPARISON:  Head CT in 03/13/2015 FINDINGS: There is a small volume, likely subacute subdural hematoma over the left cerebral convexity, largest in the frontal region where it measures up to 6 mm in thickness. There is mild left cerebral hemispheric sulcal effacement, and there is 4 mm of rightward midline shift. A small amount of subdural hemorrhage is also present in the interhemispheric fissure along the left aspect of the falx. There is no hydrocephalus. No acute infarct, parenchymal hemorrhage, or mass is seen. The hippocampi are grossly symmetric in size and signal, however detailed evaluation is precluded by prominent  motion artifact on thin section images through the temporal lobes. No significant white matter disease is seen. Cerebral volume is normal for age. Prior right cataract extraction is noted. There is mild left maxillary sinus mucosal thickening. Mastoid air cells are clear. Major intracranial vascular flow voids are preserved. IMPRESSION: Small subdural hematoma over the left cerebral convexity and along the falx. 4 mm rightward midline shift. Critical Value/emergent results were called by telephone at the time of interpretation on 03/16/2015 at 9:17 pm to Dr. Madilyn Hook, who verbally acknowledged these results. Electronically Signed   By: Sebastian Ache M.D.   On: 03/16/2015 21:21    Jeoffrey Massed, MD  [facility] Pager:[phone]  If 7PM-7AM, please contact night-coverage [email or web address] Password [email or web address] 03/22/2015, 1:21 PM   LOS: 5 days

## 2015-03-22 NOTE — Progress Notes (Signed)
ANTICOAGULATION CONSULT NOTE - Follow Up Consult  Pharmacy Consult for Heparin and Coumadin Indication: mechanical AVR  Allergies  Allergen Reactions  . Nsaids Other (See Comments)    Told not to take meds-per patient    Patient Measurements: Height: 6\' 2"  (188 cm) Weight: (!) 321 lb (145.605 kg) IBW/kg (Calculated) : 82.2 Heparin Dosing Weight: 116 kg  Vital Signs: Temp: 98.1 F (36.7 C) (10/13 1450) Temp Source: Oral (10/13 1450) BP: 127/66 mmHg (10/13 1450) Pulse Rate: 54 (10/13 1450)  Labs:  Recent Labs  03/20/15 0522 03/21/15 0710 03/22/15 0537  HGB 12.2* 12.0* 11.9*  HCT 38.9* 38.2* 39.4  PLT 188 176 178  LABPROT 16.5* 15.5* 15.3*  INR 1.32 1.21 1.19  HEPARINUNFRC 0.39 0.36 0.30    Estimated Creatinine Clearance: 156.9 mL/min (by C-G formula based on Cr of 0.69).   Assessment: 54 y/o male on chronic warfarin for mechanical AVR with seizure activity and fall on 10/4. CT head revealed subacute SDH. Neurosurgery recommended holding Coumadin until repeat CT head in 3 weeks however Neurology concerned for risk of stroke being off warfarin for that long with mechanical AVR. Neurology discussed risk/benefit with patient and wife. Pharmacy consulted to manage IV heparin with NO BOLUS and low-therapeutic goal range, and Coumadin also at low-therapeutic range.  Heparin level remains therapeutic (0.30) on 1500 units/hr. CBC stable. INR 1.83 on admit, down to 1.45 on 10/10, and Coumadin resumed conservatively. INR 1.21 today; has continued to trend down.  Has had 4 mg, 6 mg, then 7.5 mg of Coumadin.  Dr. Katrinka BlazingSmith would like more aggressive Coumadin dosing. Discussed with Dr. Jerral RalphGhimire.    Home Coumadin regimen: 7.5 mg daily except 3.75 mg on Fridays.   Last Coumadin dose prior to admission on 03/15/15.  Goal of Therapy:  Heparin level 0.3-0.5 units/ml INR ~2.0 Monitor platelets by anticoagulation protocol: Yes   Plan:    No heparin boluses with SDH. Coumadin resumed  conservatively on 10/10.   Continue heparin drip at 1500 units/hr.   Repeat Coumadin 7.5 mg today.    Will increase to Coumadin 10 mg on 10/14 if INR not increasing, per d/w Dr. Jerral RalphGhimire.   Target INR ~2.0.   Next heparin level, PT/INR and CBC in am.   Discussed with patient and wife.  Dennie Fettersgan, Allex Madia [place], [place] Pager: [phone] 03/22/2015 2:52 PM

## 2015-03-22 NOTE — Progress Notes (Addendum)
   Valve sounds are crisp.  Had neurological complaints last evening involving his left arm  INR still dropping, this morning 1.19.  Plan: Head scan to follow-up on last evening symptoms. More aggressive Coumadin dosing if CT okay.

## 2015-03-23 LAB — CBC
HCT: 40.6 % (ref 39.0–52.0)
HEMOGLOBIN: 12.7 g/dL — AB (ref 13.0–17.0)
MCH: 29.1 pg (ref 26.0–34.0)
MCHC: 31.3 g/dL (ref 30.0–36.0)
MCV: 93.1 fL (ref 78.0–100.0)
PLATELETS: 189 10*3/uL (ref 150–400)
RBC: 4.36 MIL/uL (ref 4.22–5.81)
RDW: 14.5 % (ref 11.5–15.5)
WBC: 5.8 10*3/uL (ref 4.0–10.5)

## 2015-03-23 LAB — GLUCOSE, CAPILLARY
GLUCOSE-CAPILLARY: 92 mg/dL (ref 65–99)
GLUCOSE-CAPILLARY: 83 mg/dL (ref 65–99)
GLUCOSE-CAPILLARY: 126 mg/dL — AB (ref 65–99)
Glucose-Capillary: 82 mg/dL (ref 65–99)
Glucose-Capillary: 85 mg/dL (ref 65–99)

## 2015-03-23 LAB — PROTIME-INR
INR: 1.31 (ref 0.00–1.49)
PROTHROMBIN TIME: 16.4 s — AB (ref 11.6–15.2)

## 2015-03-23 LAB — HEPARIN LEVEL (UNFRACTIONATED)
Heparin Unfractionated: 0.21 IU/mL — ABNORMAL LOW (ref 0.30–0.70)
Heparin Unfractionated: 0.33 IU/mL (ref 0.30–0.70)

## 2015-03-23 MED ORDER — WARFARIN SODIUM 7.5 MG PO TABS
7.5000 mg | ORAL_TABLET | Freq: Once | ORAL | Status: AC
  Administered 2015-03-23: 7.5 mg via ORAL
  Filled 2015-03-23: qty 1

## 2015-03-23 NOTE — Progress Notes (Signed)
ANTICOAGULATION CONSULT NOTE - Follow Up Consult  Pharmacy Consult for Heparin and Coumadin Indication: mechanical AVR  Allergies  Allergen Reactions  . Nsaids Other (See Comments)    Told not to take meds-per patient    Patient Measurements: Height: 6\' 2"  (188 cm) Weight: (!) 321 lb (145.605 kg) IBW/kg (Calculated) : 82.2 Heparin Dosing Weight: 116 kg  Vital Signs: Temp: 98.1 F (36.7 C) (10/14 0954) Temp Source: Oral (10/14 0954) BP: 98/52 mmHg (10/14 0954) Pulse Rate: 57 (10/14 0954)  Labs:  Recent Labs  03/21/15 0710 03/22/15 0537 03/23/15 1029  HGB 12.0* 11.9* 12.7*  HCT 38.2* 39.4 40.6  PLT 176 178 189  LABPROT 15.5* 15.3* 16.4*  INR 1.21 1.19 1.31  HEPARINUNFRC 0.36 0.30 0.21*    Estimated Creatinine Clearance: 156.9 mL/min (by C-G formula based on Cr of 0.69).   Assessment: 54 y/o male on chronic warfarin for mechanical AVR with seizure activity and fall on 10/4. CT head revealed subacute SDH. Neurosurgery recommended holding Coumadin until repeat CT head in 3 weeks however Neurology concerned for risk of stroke being off warfarin for that long with mechanical AVR. Neurology discussed risk/benefit with patient and wife. Pharmacy consulted to manage IV heparin with NO BOLUS and low-therapeutic goal range, and Coumadin also at low-therapeutic range.  Heparin level is subtherapeutic today (0.21) on 1500 units/hr. Had been therapeutic on this rate for several days.  IV in right hand infiltrated ~midnight; off heparin for ~1 hr 15 min.  INR 1.83 on admit, down to 1.45 on 10/10, and Coumadin resumed conservatively. INR has finally trended up some to 1.31, after Coumadin resumed conservatively on 10/10. Has had 4 mg, 6 mg, then 7.5 mg x 2 days.  Hesitant to increase Coumadin dose with INR now trending up some.  Home Coumadin regimen: 7.5 mg daily except 3.75 mg on Fridays.   Last Coumadin dose prior to admission on 03/15/15.  Goal of Therapy:  Heparin level 0.3-0.5  units/ml INR ~2.0 Monitor platelets by anticoagulation protocol: Yes   Plan:    No heparin boluses with SDH. Coumadin resumed conservatively on 10/10.   Increase heparin drip to 1600 units/hr.   Heparin level ~ 6 hrs after rate change.   Repeat Coumadin 7.5 mg today.    Target INR ~2.0.   Daily heparin level, PT/INR and CBC in am.   Discussed with patient.  Dennie Fettersgan, Shelah Heatley [place], [place] Pager: [phone] 03/23/2015 11:58 AM

## 2015-03-23 NOTE — Progress Notes (Addendum)
[facility] Progress Note   Lance BongoFrederick G Napolitano  JXB:[id number]  DOB: [birth year]  DOA: 03/16/2015 PCP: Gwynneth AlimentSANDERS,ROBYN N, MD  Brief narrative: [name] is a 54 y.o. male with history of hypertension, CAD status post CABG, a mechanical aortic valve on Coumadin presents to the hospital for headaches. According to the history, he reported a seizure (vs syncope on 10/4) a few days prior to admission. He was seen in the ER and was discharged home with an outpatient neurology referral however continued to have headaches. On the day he was admitted, according to history obtained from his wife, he had an episode where he was "staring off" and poorly responsive. Imaging in the ER revealed a subdural hematoma. He was started on IV Keppra in the ER. INR on admission was 1.8- Coumadin was held and the patient was placed on a heparin infusion EEG performed on 10/8 did not show any seizure activity or predisposition for seizure.   Subjective: Continues to have a frontal headache which is relieved with Tylenol. No nausea or vomiting. No focal numbness or weakness. No sinus drainage.  Assessment/Plan: Principal Problem:   SDH (subdural hematoma)  -In setting of Coumadin use-Coumadin initially held & patient placed on heparin infusion - Due to mechanical valve, Coumadin subsequently resumed on 10/11 with a goal INR of 2.0 per neurology  Active Problems: Syncope versus seizure 10/4 -  questionable hypoglycemia during this episode and apparently coworkers described seizure-like activity - Continue Keppra per neurology- DC Ultram, which he takes at home, as it can decrease seizure threshold -Has been evaluated by cardiology as well- 2-D echo is unrevealing-no further recommendations per cardiology    S/P aortic valve replacement -Resume Coumadin with heparin bridge for goal INR of 2.0 per neurology and cardiology    Post-concussion headache -PRN Tylenol  CAD status post CABG -Hold  aspirin  Essential hypertension -Continue metoprolol  Morbid obesity Body mass index is 41.2 kg/(m^2).  Hyperlipidemia Continue Crestor   Code Status:     Code Status Orders        Start     Ordered   03/17/15 0015  Full code   Continuous     03/17/15 0015     Family Communication:  Disposition Plan: Home when INR greater than 2  DVT prophylaxis: Heparin infusion  Consultants:Neurology, cardiology, neurosurgery   Antibiotics: Anti-infectives    None      Objective: Filed Weights   03/16/15 1536  Weight: 145.605 kg (321 lb)    Intake/Output Summary (Last 24 hours) at 03/23/15 1504 Last data filed at 03/23/15 1439  Gross per 24 hour  Intake    600 ml  Output      0 ml  Net    600 ml     Vitals Filed Vitals:   03/23/15 0615 03/23/15 0948 03/23/15 0954 03/23/15 1431  BP: 98/53 98/52 98/52  110/75  Pulse: 84 58 57 54  Temp: 98.1 F (36.7 C)  98.1 F (36.7 C) 98.1 F (36.7 C)  TempSrc: Oral  Oral Oral  Resp: 16  20 20   Height:      Weight:      SpO2: 98%  96% 99%    Exam:  General:  Pt is alert, not in acute distress  HEENT: No icterus, No thrush, oral mucosa moist  Cardiovascular: regular rate and rhythm, S1/S2 No murmur + mechanical click  Respiratory: clear to auscultation bilaterally   Abdomen: Soft, +Bowel sounds, non tender, non distended, no guarding  MSK: No LE edema, cyanosis or clubbing  Data Reviewed: Basic Metabolic Panel:  Recent Labs Lab 03/16/15 1825 03/18/15 0644  NA 138 144  K 3.8 3.6  CL 103 105  CO2 28 30  GLUCOSE 85 105*  BUN 9 8  CREATININE 0.85 0.69  CALCIUM 8.8* 8.8*  MG  --  1.9   Liver Function Tests: No results for input(s): AST, ALT, ALKPHOS, BILITOT, PROT, ALBUMIN in the last 168 hours. No results for input(s): LIPASE, AMYLASE in the last 168 hours. No results for input(s): AMMONIA in the last 168 hours. CBC:  Recent Labs Lab 03/16/15 1825  03/19/15 0552 03/20/15 0522 03/21/15 0710  03/22/15 0537 03/23/15 1029  WBC 6.8  < > 6.0 6.0 5.9 6.1 5.8  NEUTROABS 4.2  --   --   --   --   --   --   HGB 12.3*  < > 12.3* 12.2* 12.0* 11.9* 12.7*  HCT 39.6  < > 40.1 38.9* 38.2* 39.4 40.6  MCV 91.9  < > 91.8 92.6 92.9 92.7 93.1  PLT 197  < > 192 188 176 178 189  < > = values in this interval not displayed. Cardiac Enzymes: No results for input(s): CKTOTAL, CKMB, CKMBINDEX, TROPONINI in the last 168 hours. BNP (last 3 results) No results for input(s): BNP in the last 8760 hours.  ProBNP (last 3 results)  Recent Labs  04/21/14 1437  PROBNP 40.3    CBG:  Recent Labs Lab 03/22/15 1640 03/22/15 2117 03/23/15 0647 03/23/15 0804 03/23/15 1114  GLUCAP 121* 85 82 85 92    No results found for this or any previous visit (from the past 240 hour(s)).   Studies: Ct Head Wo Contrast  03/22/2015  CLINICAL DATA:  Numbness on the left side of body with generalized headache. Subdural hematoma. EXAM: CT HEAD WITHOUT CONTRAST TECHNIQUE: Contiguous axial images were obtained from the base of the skull through the vertex without intravenous contrast. COMPARISON:  Three days ago FINDINGS: Skull and Sinuses:Mild mucosal edema in the paranasal sinuses. Orbits: Right cataract resection. Brain: The isodense left subdural hematoma is difficult to visualize. No progressive mass effect or new high density to suggest interval hemorrhage. No evidence of infarct, hydrocephalus, mass lesion. IMPRESSION: Known small left cerebral convexity subdural hematoma is isodense and difficult visualize. No interval increase or new abnormality. Electronically Signed   By: Marnee Spring M.D.   On: 03/22/2015 10:00    Scheduled Meds:  Scheduled Meds: . acetaminophen  1,000 mg Oral 3 times per day  . gabapentin  400 mg Oral TID  . levETIRAcetam  500 mg Oral BID  . metoprolol succinate  50 mg Oral Daily  . rosuvastatin  10 mg Oral Daily  . warfarin  7.5 mg Oral ONCE-1800  . Warfarin - Pharmacist Dosing  Inpatient   Does not apply q1800   Continuous Infusions: . heparin 1,600 Units/hr (03/23/15 1157)    Time spent on care of this patient: 35  min   Tykeisha Peer, MD 03/23/2015, 3:04 PM  LOS: 6 days   [facility] Office  [phone] Pager - Text Page per [email or web address] If 7PM-7AM, please contact night-coverage [email or web address]

## 2015-03-23 NOTE — Progress Notes (Signed)
ANTICOAGULATION CONSULT NOTE - Follow Up Consult  Pharmacy Consult for Heparin Indication: mechanical AVR  Allergies  Allergen Reactions  . Nsaids Other (See Comments)    Told not to take meds-per patient    Patient Measurements: Height: 6\' 2"  (188 cm) Weight: (!) 321 lb (145.605 kg) IBW/kg (Calculated) : 82.2 Heparin Dosing Weight: 116 kg  Vital Signs: Temp: 97.5 F (36.4 C) (10/14 1826) Temp Source: Oral (10/14 1826) BP: 148/79 mmHg (10/14 1826) Pulse Rate: 60 (10/14 1826)  Labs:  Recent Labs  03/21/15 0710 03/22/15 0537 03/23/15 1029 03/23/15 1917  HGB 12.0* 11.9* 12.7*  --   HCT 38.2* 39.4 40.6  --   PLT 176 178 189  --   LABPROT 15.5* 15.3* 16.4*  --   INR 1.21 1.19 1.31  --   HEPARINUNFRC 0.36 0.30 0.21* 0.33    Estimated Creatinine Clearance: 156.9 mL/min (by C-G formula based on Cr of 0.69).   Assessment: 54 y/o male on chronic warfarin for mechanical AVR with seizure activity and fall on 10/4. CT head revealed subacute SDH. Neurosurgery recommended holding Coumadin until repeat CT head in 3 weeks however Neurology concerned for risk of stroke being off warfarin for that long with mechanical AVR. Neurology discussed risk/benefit with patient and wife. Pharmacy consulted to manage IV heparin with NO BOLUS and low-therapeutic goal range, and Coumadin also at low-therapeutic range.  Heparin level was low earlier today and rate was increased. Now a repeat heparin level is therapeutic at 0.33 units/mL.   Goal of Therapy:  Heparin level 0.3-0.5 units/ml INR ~2.0 Monitor platelets by anticoagulation protocol: Yes   Plan:  -continue heparin at 1600 units/hr -next HL with AM labs -daily HL and CBC -follow closely for s/s bleeding  Marian Meneely D. Emaley Applin, PharmD, BCPS Clinical Pharmacist Pager: [phone] 03/23/2015 8:10 PM

## 2015-03-23 NOTE — Progress Notes (Signed)
Stopped by pt's room at requested time to place pt on CPAP for the night. Pt stated he wasn't ready to go on CPAP at that time due to visitor in room. Pt stated he knew how to place mask on and start machine, and said he would place himself on when ready. RT reassured pt that if he had any questions, to please notify us. Pt agrees. RT will continue to monitor.

## 2015-03-23 NOTE — Evaluation (Signed)
Physical Therapy Evaluation Patient Details Name: [name] MRN: [id number] DOB: 10-May-1961 Today's Date: 03/23/2015   History of Present Illness  54 yo male with SDH after a fall and seizure on 03/13/15, now continual HA and has significant history aortic valve replacement, CABG, CAD  Clinical Impression  Pt was able to walk with PT and demonstrates significant changes in L side function.  Given pt's objectives to go back to work and his PLOF, PT requestign to send to [facility] for rehab to maximize his recovery and to take advantage of wife's presence at home to follow up.    Follow Up Recommendations [facility]    Equipment Recommendations  None recommended by PT (await rehab discharge)    Recommendations for Other Services Rehab consult     Precautions / Restrictions Precautions Precautions: Fall (telemetry) Precaution Comments: O2 sats drop with gait Restrictions Weight Bearing Restrictions: No      Mobility  Bed Mobility Overal bed mobility: Needs Assistance Bed Mobility: Supine to Sit;Sit to Supine     Supine to sit: Min guard;Min assist Sit to supine: Min guard;Min assist   General bed mobility comments: difficulty moving LLE and needs help to get on and off bed  Transfers Overall transfer level: Needs assistance Equipment used: 1 person hand held assist (IV pole) Transfers: Sit to/from [employer] Sit to Stand: Min guard;Min assist Stand pivot transfers: Min guard;Min assist (lists to the right)       General transfer comment: reminders for safety and reaching  Ambulation/Gait Ambulation/Gait assistance: Min guard;Min assist Ambulation Distance (Feet): 200 Feet Assistive device: 1 person hand held assist (IV pole) Gait Pattern/deviations: Step-through pattern;Step-to pattern;Decreased step length - right;Decreased dorsiflexion - left;Shuffle;Trunk flexed;Wide base of support Gait velocity: reduced Gait velocity interpretation: Below normal  speed for age/gender General Gait Details: very slow to move LLE and and has to place carefully over the weakness  Stairs            Wheelchair Mobility    Modified Rankin (Stroke Patients Only)       Balance Overall balance assessment: Needs assistance Sitting-balance support: Feet supported Sitting balance-Leahy Scale: Good     Standing balance support: Single extremity supported Standing balance-Leahy Scale: Poor                               Pertinent Vitals/Pain Pain Assessment: 0-10 Pain Score: 8  Pain Location: R side HA Pain Descriptors / Indicators: Headache Pain Intervention(s): Monitored during session;Repositioned;Premedicated before session    Home Living Family/patient expects to be discharged to:: Private residence Living Arrangements: Spouse/significant other Available Help at Discharge: Family;Available 24 hours/day Type of Home: House Home Access: Level entry     Home Layout: Two level;Able to live on main level with bedroom/bathroom Home Equipment: Bedside commode;Shower seat (equipment for his wife and handicapped child)      Prior Function Level of Independence: Independent         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   Dominant Hand: Right    Extremity/Trunk Assessment   Upper Extremity Assessment: LUE deficits/detail       LUE Deficits / Details: weak and neglected LUE   Lower Extremity Assessment: LLE deficits/detail   LLE Deficits / Details: L knee 3- to 3+ strength  Cervical / Trunk Assessment: Normal  Communication   Communication: No difficulties  Cognition Arousal/Alertness: Awake/alert Behavior During Therapy:  WFL for tasks assessed/performed Overall Cognitive Status: Within Functional Limits for tasks assessed                      General Comments General comments (skin integrity, edema, etc.): Has some difficulty seeing his deficits for balance as he is accustomed  to going to BR with minor help.  Pt is actually unsafe without continual supervision and contact when up esp with dynamic movement    Exercises        Assessment/Plan    PT Assessment Patient needs continued PT services  PT Diagnosis Difficulty walking;Hemiplegia non-dominant side   PT Problem List Decreased strength;Decreased range of motion;Decreased activity tolerance;Decreased balance;Decreased mobility;Decreased coordination;Decreased knowledge of use of DME;Decreased safety awareness;Decreased knowledge of precautions;Cardiopulmonary status limiting activity;Obesity;Pain  PT Treatment Interventions DME instruction;Gait training;Functional mobility training;Therapeutic activities;Therapeutic exercise;Balance training;Neuromuscular re-education;Patient/family education   PT Goals (Current goals can be found in the Care Plan section) Acute Rehab PT Goals Patient Stated Goal: to get back to work PT Goal Formulation: With patient/family Time For Goal Achievement: 04/06/15 Potential to Achieve Goals: Good    Frequency Min 5X/week   Barriers to discharge Decreased caregiver support (wife on a walker) wife cannot assist pt    Co-evaluation               End of Session   Activity Tolerance: Patient tolerated treatment well;Patient limited by fatigue;Treatment limited secondary to medical complications (Comment) (O2 sats drop with gait) Patient left: in bed;with call bell/phone within reach;with nursing/sitter in room;with family/visitor present Nurse Communication: Mobility status         Time: [id number] PT Time Calculation (min) (ACUTE ONLY): 41 min   Charges:   PT Evaluation $Initial PT Evaluation Tier I: 1 Procedure PT Treatments $Gait Training: 8-22 mins $Therapeutic Exercise: 8-22 mins   PT G Codes:        Ivar DrapeStout, Kima Malenfant E 03/23/2015, 6:48 PM   Samul Dadauth Kenn Rekowski, PT MS Acute Rehab Dept. Number: [facility] [phone] and [facility] [phone]

## 2015-03-23 NOTE — Progress Notes (Signed)
Pt tolerated CPAP last night. No noted distress. Pt ambulated with this nurse standby assist to the bathroom. At this time sitting up on the side of the bed to eat breakfast. Wife at bedside. Call bell within reach. Will continue to monitor.

## 2015-03-23 NOTE — Procedures (Signed)
Pt placed on CPAP with prior setting of 8.  Pt is resting comfortably and tolerating well.

## 2015-03-24 LAB — CBC
HCT: 39 % (ref 39.0–52.0)
Hemoglobin: 12 g/dL — ABNORMAL LOW (ref 13.0–17.0)
MCH: 28.4 pg (ref 26.0–34.0)
MCHC: 30.8 g/dL (ref 30.0–36.0)
MCV: 92.4 fL (ref 78.0–100.0)
PLATELETS: 176 10*3/uL (ref 150–400)
RBC: 4.22 MIL/uL (ref 4.22–5.81)
RDW: 14.8 % (ref 11.5–15.5)
WBC: 6 10*3/uL (ref 4.0–10.5)

## 2015-03-24 LAB — TROPONIN I
TROPONIN I: 0.03 ng/mL (ref <0.031)
TROPONIN I: 0.03 ng/mL (ref <0.031)

## 2015-03-24 LAB — GLUCOSE, CAPILLARY
GLUCOSE-CAPILLARY: 93 mg/dL (ref 65–99)
GLUCOSE-CAPILLARY: 91 mg/dL (ref 65–99)
Glucose-Capillary: 101 mg/dL — ABNORMAL HIGH (ref 65–99)
Glucose-Capillary: 126 mg/dL — ABNORMAL HIGH (ref 65–99)

## 2015-03-24 LAB — PROTIME-INR
INR: 1.35 (ref 0.00–1.49)
Prothrombin Time: 16.8 seconds — ABNORMAL HIGH (ref 11.6–15.2)

## 2015-03-24 LAB — HEPARIN LEVEL (UNFRACTIONATED): HEPARIN UNFRACTIONATED: 0.44 [IU]/mL (ref 0.30–0.70)

## 2015-03-24 MED ORDER — WARFARIN SODIUM 5 MG PO TABS
10.0000 mg | ORAL_TABLET | Freq: Once | ORAL | Status: AC
  Administered 2015-03-24: 10 mg via ORAL
  Filled 2015-03-24: qty 2

## 2015-03-24 MED ORDER — WARFARIN SODIUM 5 MG PO TABS: 10.0000 mg | ORAL_TABLET | Freq: Once | ORAL | Status: DC

## 2015-03-24 NOTE — Progress Notes (Signed)
[facility] Progress Note   BARNEY RUSSOMANNO  ZOX:[id number]  DOB: 04-08-1961  DOA: 03/16/2015 PCP: Gwynneth Aliment, MD  Brief narrative: [name] is a 54 y.o. male with history of hypertension, CAD status post CABG, a mechanical aortic valve on Coumadin presents to the hospital for headaches. According to the history, he reported a seizure (vs syncope on 10/4) a few days prior to admission. He was seen in the ER and was discharged home with an outpatient neurology referral however continued to have headaches. On the day he was admitted, according to history obtained from his wife, he had an episode where he was "staring off" and poorly responsive. Imaging in the ER revealed a subdural hematoma. He was started on IV Keppra in the ER. INR on admission was 1.8- Coumadin was held and the patient was placed on a heparin infusion EEG performed on 10/8 did not show any seizure activity or predisposition for seizure.   Subjective: No complaints today. Call by RN later that the patient complained of chest tightness. He was able to get up out of his chair and wash up at the sink without worsening of discomfort. Discomfort began to fade off in about 15 minutes. No complaints of radiation of pain shortness of breath nausea or diaphoresis.  Assessment/Plan: Principal Problem:   SDH (subdural hematoma)  -In setting of Coumadin use-Coumadin initially held & patient placed on heparin infusion - Due to mechanical valve, Coumadin subsequently resumed on 10/11 with a goal INR of 2.0 per neurology  Active Problems: Syncope versus seizure 10/4 -Or some questionable hypoglycemia during this episode and Apparently coworkers described seizure-like activity - Continue Keppra per neurology- DC Ultram, which he takes at home, as it can decrease seizure threshold -Has been evaluated by cardiology as well- 2-D echo is unrevealing-no further recommendations per cardiology  Chest  tightness -Episode lasted for about 15 minutes-will obtain EKG and check troponin    S/P aortic valve replacement -Resume Coumadin with heparin bridge for goal INR of 2.0 per neurology and cardiology    Post-concussion headache -PRN Tylenol  CAD status post CABG -Hold aspirin  Essential hypertension -Continue metoprolol  Morbid obesity Body mass index is 41.2 kg/(m^2).  Hyperlipidemia Continue Crestor   Code Status:     Code Status Orders        Start     Ordered   03/17/15 0015  Full code   Continuous     03/17/15 0015     Family Communication: Wife at bedside Disposition Plan: Home when INR greater than 2  DVT prophylaxis: Heparin infusion  Consultants:Neurology, cardiology, neurosurgery   Antibiotics: Anti-infectives    None      Objective: Filed Weights   03/16/15 1536  Weight: 145.605 kg (321 lb)    Intake/Output Summary (Last 24 hours) at 03/24/15 1352 Last data filed at 03/24/15 1340  Gross per 24 hour  Intake    960 ml  Output      0 ml  Net    960 ml     Vitals Filed Vitals:   03/24/15 0630 03/24/15 0915 03/24/15 1206 03/24/15 1340  BP: 107/57 127/59 112/60 113/47  Pulse: 92 91 54 79  Temp: 98.7 F (37.1 C) 97.9 F (36.6 C)  98.4 F (36.9 C)  TempSrc: Axillary Oral  Oral  Resp: Height:      Weight:      SpO2: 97% 99% 94% 96%    Exam:  General:  Pt is alert, not in acute distress  HEENT: No icterus, No thrush, oral mucosa moist  Cardiovascular: regular rate and rhythm, S1/S2 No murmur + mechanical click  Respiratory: clear to auscultation bilaterally   Abdomen: Soft, +Bowel sounds, non tender, non distended, no guarding  MSK: No LE edema, cyanosis or clubbing  Data Reviewed: Basic Metabolic Panel:  Recent Labs Lab 03/18/15 0644  NA 144  K 3.6  CL 105  CO2 30  GLUCOSE 105*  BUN 8  CREATININE 0.69  CALCIUM 8.8*  MG 1.9   Liver Function Tests: No results for input(s): AST, ALT, ALKPHOS, BILITOT,  PROT, ALBUMIN in the last 168 hours. No results for input(s): LIPASE, AMYLASE in the last 168 hours. No results for input(s): AMMONIA in the last 168 hours. CBC:  Recent Labs Lab 03/20/15 0522 03/21/15 0710 03/22/15 0537 03/23/15 1029 03/24/15 0720  WBC 6.0 5.9 6.1 5.8 6.0  HGB 12.2* 12.0* 11.9* 12.7* 12.0*  HCT 38.9* 38.2* 39.4 40.6 39.0  MCV 92.6 92.9 92.7 93.1 92.4  PLT 188 176 178 189 176   Cardiac Enzymes: No results for input(s): CKTOTAL, CKMB, CKMBINDEX, TROPONINI in the last 168 hours. BNP (last 3 results) No results for input(s): BNP in the last 8760 hours.  ProBNP (last 3 results)  Recent Labs  04/21/14 1437  PROBNP 40.3    CBG:  Recent Labs Lab 03/23/15 1114 03/23/15 1624 03/23/15 2125 03/24/15 0629 03/24/15 1140  GLUCAP 92 83 126* 93 101*    No results found for this or any previous visit (from the past 240 hour(s)).   Studies: No results found.  Scheduled Meds:  Scheduled Meds: . acetaminophen  1,000 mg Oral 3 times per day  . gabapentin  400 mg Oral TID  . levETIRAcetam  500 mg Oral BID  . metoprolol succinate  50 mg Oral Daily  . rosuvastatin  10 mg Oral Daily  . warfarin  10 mg Oral Once  . Warfarin - Pharmacist Dosing Inpatient   Does not apply q1800   Continuous Infusions: . heparin 1,600 Units/hr (03/24/15 [id number])    Time spent on care of this patient: 35  min   Jessyka Austria, MD 03/24/2015, 1:52 PM  LOS: 7 days   [facility] Office  [phone] Pager - Text Page per [email or web address] If 7PM-7AM, please contact night-coverage [email or web address]

## 2015-03-24 NOTE — Progress Notes (Signed)
Physical Therapy Treatment Patient Details Name: Lance BongoFrederick G [name] MRN: [id number] DOB: 1960/06/25 Today's Date: 03/24/2015    History of Present Illness 54 yo male with SDH after a fall and seizure on 03/13/15, now continual HA and has significant history aortic valve replacement, CABG, CAD    PT Comments    Pt with improved ambulation tolerance and ambulated without AD. Pt with increased falls risk as demo'd by score of 16 on DGI.  Pt to benefit from HHPT to address generalized weakness and balance deficits.   Follow Up Recommendations  Home health PT;Supervision - Intermittent     Equipment Recommendations  None recommended by PT    Recommendations for Other Services       Precautions / Restrictions Precautions Precautions: Fall Restrictions Weight Bearing Restrictions: No    Mobility  Bed Mobility               General bed mobility comments: pt up at bedside  Transfers Overall transfer level: Needs assistance Equipment used: None Transfers: Sit to/from Stand Sit to Stand: Min guard            Ambulation/Gait Ambulation/Gait assistance: Min guard Ambulation Distance (Feet): 200 Feet   Gait Pattern/deviations: Step-through pattern;Decreased stride length Gait velocity: decresaed   General Gait Details: scuffs both feet, decreased step height, no episodes of LOB   Stairs            Wheelchair Mobility    Modified Rankin (Stroke Patients Only)       Balance Overall balance assessment: Needs assistance        Pt able to stand on R LE for 5 second but unable to complete L single limb stance without assist.           Tandem Stance - Right Leg:  (pt with difficulty maintaining, requring minA) Tandem Stance - Left Leg:  (pt with difficulty maintaining, requring minA)            Cognition Arousal/Alertness: Awake/alert Behavior During Therapy: WFL for tasks assessed/performed Overall Cognitive Status: Within Functional Limits  for tasks assessed                      Exercises  able to complete wall push ups x 5 reps and 2 R handed wall push ups and 2 L handed wall push ups    General Comments General comments (skin integrity, edema, etc.): pt reports mild dizziness with turning      Pertinent Vitals/Pain Pain Assessment: No/denies pain    Home Living                      Prior Function            PT Goals (current goals can now be found in the care plan section) Acute Rehab PT Goals Patient Stated Goal: . Progress towards PT goals: Progressing toward goals    Frequency  Min 4X/week    PT Plan Discharge plan needs to be updated    Co-evaluation             End of Session Equipment Utilized During Treatment: Gait belt Activity Tolerance: Patient tolerated treatment well;Patient limited by fatigue;Treatment limited secondary to medical complications (Comment) Patient left:  (in bathroom with RN)     Time: 9562-1308: 1414-1438 PT Time Calculation (min) (ACUTE ONLY): 24 min  Charges:  $Gait Training: 23-37 mins  G CodesMarcene Brawn 03/24/2015, 4:29 PM   Lewis Shock, PT, DPT Pager #: [phone] Office #: [phone]

## 2015-03-24 NOTE — Progress Notes (Signed)
Patient places self on and off CPAP. Pt setting is 8, water chamber full. RT told patient to call if needing any assistance.

## 2015-03-24 NOTE — Progress Notes (Signed)
Called to the room by patient; he is complaining of chest discomfort; not radiating; patient is warm and dry; denies shortness of breath; denies any exertion in activity; other then being OOB at the sink and sat in the chair; telemetry is reading sinus brady; vital signs taken; Dr. Butler Denmarkizwan notified; orders received; continue to monitor.

## 2015-03-24 NOTE — Progress Notes (Signed)
ANTICOAGULATION CONSULT NOTE - Follow Up Consult  Pharmacy Consult for Heparin and Coumadin Indication: mechanical AVR  Allergies  Allergen Reactions  . Nsaids Other (See Comments)    Told not to take meds-per patient    Patient Measurements: Height: 6\' 2"  (188 cm) Weight: (!) 321 lb (145.605 kg) IBW/kg (Calculated) : 82.2 Heparin Dosing Weight: 116 kg  Vital Signs: Temp: 97.9 F (36.6 C) (10/15 0915) Temp Source: Oral (10/15 0915) BP: 127/59 mmHg (10/15 0915) Pulse Rate: 91 (10/15 0915)  Labs:  Recent Labs  03/22/15 0537 03/23/15 1029 03/23/15 1917 03/24/15 0720  HGB 11.9* 12.7*  --  12.0*  HCT 39.4 40.6  --  39.0  PLT 178 189  --  176  LABPROT 15.3* 16.4*  --  16.8*  INR 1.19 1.31  --  1.35  HEPARINUNFRC 0.30 0.21* 0.33 0.44    Estimated Creatinine Clearance: 156.9 mL/min (by C-G formula based on Cr of 0.69).   Assessment: 54 y/o male on chronic warfarin for mechanical AVR with seizure activity and fall on 10/4. CT head revealed subacute SDH. Neurosurgery recommended holding Coumadin until repeat CT head in 3 weeks however Neurology concerned for risk of stroke being off warfarin for that long with mechanical AVR. Neurology discussed risk/benefit with patient and wife. Pharmacy consulted to manage IV heparin with NO BOLUS and low-therapeutic goal range, and Coumadin also at low-therapeutic range.  Heparin level is therapeutic today (0.44) on 1600 units/hr.   INR 1.83 on admit and Coumadin resumed conservatively. INR 1.35 today after Coumadin resumed conservatively on 10/10. Has had 4 mg, 6 mg, then 7.5 mg x 3 days.  CBC stable, no bleeding reported.  Home Coumadin regimen: 7.5 mg daily except 3.75 mg on Fridays.   Last Coumadin dose prior to admission on 03/15/15.  Goal of Therapy:  Heparin level 0.3-0.5 units/ml INR ~2.0 Monitor platelets by anticoagulation protocol: Yes   Plan:   - No heparin boluses with SDH. Coumadin resumed conservatively on  10/10. -continue heparin drip to 1600 units/hr.   - Coumadin 10 mg today. - give early   Target INR ~2.0.   Daily heparin level, PT/INR and CBC in am. Herby AbrahamMichelle T. Jinger Middlesworth, Pharm.D. [phone] 03/24/2015 11:27 AM

## 2015-03-25 ENCOUNTER — Inpatient Hospital Stay (HOSPITAL_COMMUNITY): Payer: BLUE CROSS/BLUE SHIELD

## 2015-03-25 DIAGNOSIS — R29898 Other symptoms and signs involving the musculoskeletal system: Secondary | ICD-10-CM

## 2015-03-25 LAB — TROPONIN I: Troponin I: 0.03 ng/mL (ref <0.031)

## 2015-03-25 LAB — GLUCOSE, CAPILLARY
GLUCOSE-CAPILLARY: 84 mg/dL (ref 65–99)
GLUCOSE-CAPILLARY: 82 mg/dL (ref 65–99)
GLUCOSE-CAPILLARY: 127 mg/dL — AB (ref 65–99)
GLUCOSE-CAPILLARY: 100 mg/dL — AB (ref 65–99)

## 2015-03-25 LAB — CBC
HEMATOCRIT: 38.6 % — AB (ref 39.0–52.0)
Hemoglobin: 12 g/dL — ABNORMAL LOW (ref 13.0–17.0)
MCH: 28.8 pg (ref 26.0–34.0)
MCHC: 31.1 g/dL (ref 30.0–36.0)
MCV: 92.6 fL (ref 78.0–100.0)
Platelets: 189 10*3/uL (ref 150–400)
RBC: 4.17 MIL/uL — ABNORMAL LOW (ref 4.22–5.81)
RDW: 14.6 % (ref 11.5–15.5)
WBC: 6.1 10*3/uL (ref 4.0–10.5)

## 2015-03-25 LAB — PROTIME-INR
INR: 1.41 (ref 0.00–1.49)
Prothrombin Time: 17.4 seconds — ABNORMAL HIGH (ref 11.6–15.2)

## 2015-03-25 LAB — HEPARIN LEVEL (UNFRACTIONATED): Heparin Unfractionated: 0.47 IU/mL (ref 0.30–0.70)

## 2015-03-25 MED ORDER — HYDROCODONE-ACETAMINOPHEN 5-325 MG PO TABS
1.0000 | ORAL_TABLET | Freq: Four times a day (QID) | ORAL | Status: DC | PRN
  Administered 2015-03-25 – 2015-03-28 (×4): 1 via ORAL
  Filled 2015-03-25 (×5): qty 1

## 2015-03-25 MED ORDER — GADOBENATE DIMEGLUMINE 529 MG/ML IV SOLN
20.0000 mL | Freq: Once | INTRAVENOUS | Status: AC | PRN
  Administered 2015-03-25: 20 mL via INTRAVENOUS

## 2015-03-25 MED ORDER — WARFARIN SODIUM 5 MG PO TABS
10.0000 mg | ORAL_TABLET | Freq: Once | ORAL | Status: AC
  Administered 2015-03-25: 10 mg via ORAL
  Filled 2015-03-25: qty 2

## 2015-03-25 MED ORDER — HYDROCODONE-ACETAMINOPHEN 7.5-325 MG PO TABS
1.0000 | ORAL_TABLET | Freq: Once | ORAL | Status: AC
  Administered 2015-03-25: 1 via ORAL
  Filled 2015-03-25: qty 1

## 2015-03-25 MED ORDER — LORAZEPAM 2 MG/ML IJ SOLN
1.0000 mg | Freq: Once | INTRAMUSCULAR | Status: AC
  Administered 2015-03-25: 1 mg via INTRAVENOUS
  Filled 2015-03-25: qty 1

## 2015-03-25 NOTE — Progress Notes (Signed)
ANTICOAGULATION CONSULT NOTE - Follow Up Consult  Pharmacy Consult for Heparin and Coumadin Indication: mechanical AVR  Allergies  Allergen Reactions  . Nsaids Other (See Comments)    Told not to take meds-per patient    Patient Measurements: Height: 6\' 2"  (188 cm) Weight: (!) 321 lb (145.605 kg) IBW/kg (Calculated) : 82.2 Heparin Dosing Weight: 116 kg  Vital Signs: Temp: 97.9 F (36.6 C) (10/16 1044) Temp Source: Oral (10/16 1044) BP: 112/71 mmHg (10/16 1044) Pulse Rate: 96 (10/16 1044)  Labs:  Recent Labs  03/23/15 1029 03/23/15 1917 03/24/15 0720 03/24/15 1454 03/24/15 1913 03/25/15 0030  HGB 12.7*  --  12.0*  --   --  12.0*  HCT 40.6  --  39.0  --   --  38.6*  PLT 189  --  176  --   --  189  LABPROT 16.4*  --  16.8*  --   --  17.4*  INR 1.31  --  1.35  --   --  1.41  HEPARINUNFRC 0.21* 0.33 0.44  --   --  0.47  TROPONINI  --   --   --  0.03 0.03 0.03    Estimated Creatinine Clearance: 156.9 mL/min (by C-G formula based on Cr of 0.69).   Assessment: 54 y/o male on chronic warfarin for mechanical AVR with seizure activity and fall on 10/4. CT head revealed subacute SDH.  Heparin level is therapeutic today (0.47) on 1600 units/hr.   INR 1.83 on admit and Coumadin resumed conservatively. INR 1.41 today after Coumadin resumed conservatively on 10/10.  CBC stable, no bleeding reported. Heparin drip to be interrupted today for MRI - to be off for MRI then resumed at same rate after MRI.  Home Coumadin regimen: 7.5 mg daily except 3.75 mg on Fridays.   Last Coumadin dose prior to admission on 03/15/15.  Goal of Therapy:  Heparin level 0.3-0.5 units/ml INR ~2.0 Monitor platelets by anticoagulation protocol: Yes   Plan:   - No heparin boluses with SDH. Coumadin resumed conservatively on 10/10. -continue heparin drip to 1600 units/hr. - Coumadin 10 mg today.  -  Daily heparin level, PT/INR and CBC in am.  Herby AbrahamMichelle T. Colon Rueth, Pharm.D. [phone] 03/25/2015 11:36  AM

## 2015-03-25 NOTE — Progress Notes (Signed)
[facility] Progress Note   CLARNCE HOMAN  VHQ:[id number]  DOB: 1960-10-23  DOA: 03/16/2015 PCP: Gwynneth Aliment, MD  Brief narrative: [name] is a 54 y.o. male with history of hypertension, CAD status post CABG, a mechanical aortic valve on Coumadin presents to the hospital for headaches. According to the history, he reported a seizure (vs syncope on 10/4) a few days prior to admission. He was seen in the ER and was discharged home with an outpatient neurology referral however continued to have headaches. On the day he was admitted, according to history obtained from his wife, he had an episode where he was "staring off" and poorly responsive. Imaging in the ER revealed a subdural hematoma. He was started on IV Keppra in the ER. INR on admission was 1.8- Coumadin was held and the patient was placed on a heparin infusion EEG performed on 10/8 did not show any seizure activity or predisposition for seizure.   Subjective: Complains today of left leg being numb and headaches getting worse. His wife is demanding we give him stronger headache medication. He apparently received a one time order of Vicodin last night that helped him and he is requesting this.   Assessment/Plan: Principal Problem:   SDH (subdural hematoma)  -In setting of Coumadin use-Coumadin initially held & patient placed on heparin infusion - Due to mechanical valve, Coumadin subsequently resumed on 10/11 with a goal INR of 2.0 per neurology  Active Problems: Syncope versus seizure 10/4 -Or some questionable hypoglycemia during this episode and apparently coworkers described seizure-like activity - Continue Keppra per neurology- DC Ultram, which he takes at home, as it can decrease seizure threshold -Has been evaluated by cardiology as well- 2-D echo is unrevealing-no further recommendations per cardiology  Chest tightness -Episode lasted for about 15 minutes- EKG unrevealing and troponin x 3 sets  negative  Numbness in left leg - subjective- able to respond to pain quite well despite stating to me that he cannot feel a thing in the leg - will obtain MRI to ensure subdural is stable    S/P aortic valve replacement -Resumed Coumadin with heparin bridge for goal INR of 2.0 per neurology and cardiology    Post-concussion headache -PRN Tylenol and Tramadol is apparently ineffective- d/c Tramadol, start PRN Vicodin  CAD status post CABG -Hold aspirin  Essential hypertension -Continue metoprolol  Morbid obesity Body mass index is 41.2 kg/(m^2).  Hyperlipidemia - Continue Crestor   Code Status:     Code Status Orders        Start     Ordered   03/17/15 0015  Full code   Continuous     03/17/15 0015     Family Communication: Wife at bedside Disposition Plan: Home when INR greater than 2 - f/u MRI DVT prophylaxis: Heparin infusion  Consultants:Neurology, cardiology, neurosurgery   Antibiotics: Anti-infectives    None      Objective: Filed Weights   03/16/15 1536  Weight: 145.605 kg (321 lb)    Intake/Output Summary (Last 24 hours) at 03/25/15 1129 Last data filed at 03/24/15 1340  Gross per 24 hour  Intake    240 ml  Output      0 ml  Net    240 ml     Vitals Filed Vitals:   03/24/15 2140 03/25/15 0128 03/25/15 0513 03/25/15 1044  BP: 112/54 114/56 101/46 112/71  Pulse: 87 91 95 96  Temp: 98.8 F (37.1 C) 97.8 F (36.6 C) 97.7 F (36.5  C) 97.9 F (36.6 C)  TempSrc: Oral Oral Oral Oral  Resp: 20 18 16 20   Height:      Weight:      SpO2: 95% 95% 96% 98%    Exam:  General:  Pt is alert, not in acute distress  HEENT: No icterus, No thrush, oral mucosa moist  Cardiovascular: regular rate and rhythm, S1/S2 No murmur + mechanical click  Respiratory: clear to auscultation bilaterally   Abdomen: Soft, +Bowel sounds, non tender, non distended, no guarding  MSK: No LE edema, cyanosis or clubbing  Data Reviewed: Basic Metabolic Panel: No  results for input(s): NA, K, CL, CO2, GLUCOSE, BUN, CREATININE, CALCIUM, MG, PHOS in the last 168 hours. Liver Function Tests: No results for input(s): AST, ALT, ALKPHOS, BILITOT, PROT, ALBUMIN in the last 168 hours. No results for input(s): LIPASE, AMYLASE in the last 168 hours. No results for input(s): AMMONIA in the last 168 hours. CBC:  Recent Labs Lab 03/21/15 0710 03/22/15 0537 03/23/15 1029 03/24/15 0720 03/25/15 0030  WBC 5.9 6.1 5.8 6.0 6.1  HGB 12.0* 11.9* 12.7* 12.0* 12.0*  HCT 38.2* 39.4 40.6 39.0 38.6*  MCV 92.9 92.7 93.1 92.4 92.6  PLT 176 178 189 176 189   Cardiac Enzymes:  Recent Labs Lab 03/24/15 1454 03/24/15 1913 03/25/15 0030  TROPONINI 0.03 0.03 0.03   BNP (last 3 results) No results for input(s): BNP in the last 8760 hours.  ProBNP (last 3 results)  Recent Labs  04/21/14 1437  PROBNP 40.3    CBG:  Recent Labs Lab 03/24/15 1140 03/24/15 1622 03/24/15 2159 03/25/15 0632 03/25/15 1123  GLUCAP 101* 91 126* 84 82    No results found for this or any previous visit (from the past 240 hour(s)).   Studies: No results found.  Scheduled Meds:  Scheduled Meds: . acetaminophen  1,000 mg Oral 3 times per day  . gabapentin  400 mg Oral TID  . levETIRAcetam  500 mg Oral BID  . metoprolol succinate  50 mg Oral Daily  . rosuvastatin  10 mg Oral Daily  . Warfarin - Pharmacist Dosing Inpatient   Does not apply q1800   Continuous Infusions: . heparin 1,600 Units/hr (03/25/15 1114)    Time spent on care of this patient: 35  min   Temeca Somma, MD 03/25/2015, 11:29 AM  LOS: 8 days   [facility] Office  [phone] Pager - Text Page per [email or web address] If 7PM-7AM, please contact night-coverage [email or web address]

## 2015-03-26 LAB — CBC
HEMATOCRIT: 40.4 % (ref 39.0–52.0)
HEMOGLOBIN: 12.3 g/dL — AB (ref 13.0–17.0)
MCH: 28.2 pg (ref 26.0–34.0)
MCHC: 30.4 g/dL (ref 30.0–36.0)
MCV: 92.7 fL (ref 78.0–100.0)
PLATELETS: 180 10*3/uL (ref 150–400)
RBC: 4.36 MIL/uL (ref 4.22–5.81)
RDW: 14.7 % (ref 11.5–15.5)
WBC: 6.5 10*3/uL (ref 4.0–10.5)

## 2015-03-26 LAB — GLUCOSE, CAPILLARY
GLUCOSE-CAPILLARY: 89 mg/dL (ref 65–99)
GLUCOSE-CAPILLARY: 101 mg/dL — AB (ref 65–99)
Glucose-Capillary: 90 mg/dL (ref 65–99)
Glucose-Capillary: 125 mg/dL — ABNORMAL HIGH (ref 65–99)

## 2015-03-26 LAB — HEPARIN LEVEL (UNFRACTIONATED): Heparin Unfractionated: 0.46 IU/mL (ref 0.30–0.70)

## 2015-03-26 LAB — PROTIME-INR
INR: 1.53 — ABNORMAL HIGH (ref 0.00–1.49)
PROTHROMBIN TIME: 18.5 s — AB (ref 11.6–15.2)

## 2015-03-26 MED ORDER — WARFARIN SODIUM 7.5 MG PO TABS
15.0000 mg | ORAL_TABLET | Freq: Once | ORAL | Status: AC
  Administered 2015-03-26: 15 mg via ORAL
  Filled 2015-03-26: qty 2

## 2015-03-26 NOTE — Care Management Note (Signed)
Case Management Note  Patient Details  Name: [name] MRN: [id number] Date of Birth: Dec 06, 1960  Subjective/Objective:                    Action/Plan: Patient continues with his Heparin/ Coumadin bridge. INR today 1.53 and awaiting it to be 2.0. CM continuing to follow for discharge needs.   Expected Discharge Date:                  Expected Discharge Plan:  Home/Self Care  In-House Referral:     Discharge planning Services  Follow-up appt scheduled  Post Acute Care Choice:    Choice offered to:     DME Arranged:    DME Agency:     HH Arranged:    HH Agency:     Status of Service:  In process, will continue to follow  Medicare Important Message Given:    Date Medicare IM Given:    Medicare IM give by:    Date Additional Medicare IM Given:    Additional Medicare Important Message give by:     If discussed at Long Length of Stay Meetings, dates discussed:    Additional Comments:  Lance BaloKelli F Kawhi Diebold, RN 03/26/2015, 11:23 AM

## 2015-03-26 NOTE — Progress Notes (Signed)
Placed patient on CPAP for the night.  Patient tolerating well at this time. 

## 2015-03-26 NOTE — Progress Notes (Signed)
Physical Therapy Treatment Patient Details Name: Lance BongoFrederick G [name] MRN: [id number] DOB: 11-Jun-1960 Today's Date: 03/26/2015    History of Present Illness 54 yo male with SDH after a fall and seizure on 03/13/15, now continual HA and has significant history aortic valve replacement, CABG, CAD    PT Comments    Pt continues to progress towards goals. Pt required MinA during advanced gait training to avoid LOB. Pt presented with increased initiation time of LLE during grapevine and carioca stepping to the right. Pt required frequent UE support during single leg stance to avoid LOB. Pt will benefit from continued skilled acute care physical therapy to improve mobility and balance deficits. Pt will benefit from home health PT upon discharge to decrease functional deficits. Pt would benefit from continuing outpatient PT following HHPT due to need to improve function with work related tasks.  Follow Up Recommendations  Home health PT;Outpatient PT;Supervision - Intermittent (Home Health followed by Outpatient for RTW)     Equipment Recommendations  None recommended by PT    Recommendations for Other Services       Precautions / Restrictions Precautions Precautions: Fall Restrictions Weight Bearing Restrictions: No    Mobility  Bed Mobility Overal bed mobility: Needs Assistance Bed Mobility: Supine to Sit     Supine to sit: Modified independent (Device/Increase time)        Transfers Overall transfer level: Needs assistance Equipment used: None Transfers: Sit to/from Stand Sit to Stand: Supervision            Ambulation/Gait Ambulation/Gait assistance: Supervision;Min guard Ambulation Distance (Feet): 200 Feet Assistive device: None Gait Pattern/deviations: Step-through pattern;Wide base of support;Decreased stride length Gait velocity: decresaed   General Gait Details: poor foot clearance during swing phase   Stairs            Wheelchair Mobility     Modified Rankin (Stroke Patients Only)       Balance Overall balance assessment: Needs assistance Sitting-balance support: Feet supported;No upper extremity supported Sitting balance-Leahy Scale: Good       Standing balance-Leahy Scale: Fair   Single Leg Stance - Right Leg: 10 Single Leg Stance - Left Leg: 10 Tandem Stance - Right Leg: 30 Tandem Stance - Left Leg: 30   Rhomberg - Eyes Closed: 30 High level balance activites: Side stepping;Other (comment) (grape vine, carioca, head turns (horizontal/vertical), walking over and around obstacles, pivot, change in speed. ) High Level Balance Comments: Pt required constant vcs for sequencing during high level balance activities. Pt presented with increased time for initiation of LLE movement during grape vine and carioca to the R side    Cognition Arousal/Alertness: Awake/alert Behavior During Therapy: WFL for tasks assessed/performed Overall Cognitive Status: Within Functional Limits for tasks assessed                      Exercises      General Comments        Pertinent Vitals/Pain Pain Assessment: Faces Pain Score: 4  Pain Location: R hip Pain Descriptors / Indicators: Constant Pain Intervention(s): Monitored during session    Home Living                      Prior Function            PT Goals (current goals can now be found in the care plan section) Progress towards PT goals: Progressing toward goals    Frequency  Min 4X/week    PT  Plan Discharge plan needs to be updated    Co-evaluation             End of Session Equipment Utilized During Treatment: Gait belt Activity Tolerance: Patient tolerated treatment well;Patient limited by fatigue Patient left: in bed;with family/visitor present;with call bell/phone within reach     Time: 1440-1515 PT Time Calculation (min) (ACUTE ONLY): 35 min  Charges:  $Gait Training: 23-37 mins                    G Codes:       Ardean Simonich,CYNDI Apr 09, 2015, 3:32 PM  Sheran Lawless, PT [phone] 04/09/15  Note written in collaboration with Garner Nash, SPT

## 2015-03-26 NOTE — Progress Notes (Addendum)
Inpatient Rehabilitation  We received a prescreen request on 03/23/15 for consideration for IP Rehab.  Note pt. progressing well with PT yesterday and is now at min guard assist 200' without loss of balance and without assistive device.  IP Rehab consult not recommended due to pt. progressing well.  Please call if questions.  Weldon PickingSusan Vala Raffo PT Inpatient Rehab Admissions Coordinator Cell [phone] Office [phone]

## 2015-03-26 NOTE — Progress Notes (Signed)
ANTICOAGULATION CONSULT NOTE - Follow Up Consult  Pharmacy Consult for Heparin and Coumadin Indication: mechanical AVR  Allergies  Allergen Reactions  . Nsaids Other (See Comments)    Told not to take meds-per patient    Patient Measurements: Height: 6\' 2"  (188 cm) Weight: (!) 321 lb (145.605 kg) IBW/kg (Calculated) : 82.2 Heparin Dosing Weight: 116 kg  Vital Signs: Temp: 97.6 F (36.4 C) (10/17 0605) Temp Source: Oral (10/17 0605) BP: 112/77 mmHg (10/17 0605) Pulse Rate: 80 (10/17 0605)  Labs:  Recent Labs  03/24/15 0720 03/24/15 1454 03/24/15 1913 03/25/15 0030 03/26/15 0440  HGB 12.0*  --   --  12.0* 12.3*  HCT 39.0  --   --  38.6* 40.4  PLT 176  --   --  189 180  LABPROT 16.8*  --   --  17.4* 18.5*  INR 1.35  --   --  1.41 1.53*  HEPARINUNFRC 0.44  --   --  0.47 0.46  TROPONINI  --  0.03 0.03 0.03  --     Estimated Creatinine Clearance: 156.9 mL/min (by C-G formula based on Cr of 0.69).   Assessment: 54 y/o male on chronic warfarin for mechanical AVR with seizure activity and fall on 10/4. CT head revealed subacute SDH.   Heparin level is therapeutic today (0.46) on 1600 units/hr. INR is subtherapeutic at 1.53 but the plan has been to slowly increase INR to 2 and this INR is not reflective of the recent increase in dose to 10 mg warfarin. No reported or noted bleeding.   Home Coumadin regimen: 7.5 mg daily except 3.75 mg on Fridays.    Goal of Therapy:  Heparin level 0.3-0.5 units/ml INR ~2.0 Monitor platelets by anticoagulation protocol: Yes   Plan:  - No heparin boluses with SDH. Coumadin resumed conservatively on 10/10. - Continue heparin drip to 1600 units/hr. - Coumadin 15 mg x 1 ordered by the attending MD - Daily heparin level, PT/INR and CBC in am.    Juanita CraverStacey Karl, PharmD, BCPS Clinical Pharmacist [phone]

## 2015-03-26 NOTE — Progress Notes (Addendum)
[facility] Progress Note   [name]  [id number]:[id number]  DOB: [birth year]  DOA: 03/16/2015 PCP: Gwynneth Aliment, MD  Brief narrative: [name] is a 54 y.o. male with history of hypertension, CAD status post CABG, a mechanical aortic valve on Coumadin presents to the hospital for headaches. According to the history, he reported a seizure (vs syncope on 10/4) a few days prior to admission. He was seen in the ER and was discharged home with an outpatient neurology referral however continued to have headaches. On the day he was admitted, according to history obtained from his wife, he had an episode where he was "staring off" and poorly responsive. Imaging in the ER revealed a subdural hematoma. He was started on IV Keppra in the ER. INR on admission was 1.8- Coumadin was held and the patient was placed on a heparin infusion EEG performed on 10/8 did not show any seizure activity or predisposition for seizure.   Subjective: Complains today of left leg being numb above the knee but no difficulty ambulating , HA is better   Assessment/Plan: Principal Problem:   SDH (subdural hematoma) -getting smaller on repeat MRI  -In setting of Coumadin use-Coumadin initially held & patient placed on heparin infusion - Due to mechanical valve, Coumadin subsequently resumed on 10/11 with a goal INR of 2.0 per neurology    Syncope versus seizure 10/4   questionable  seizure-like activity - Continue Keppra per neurology- DC Ultram, which he takes at home, as it can decrease seizure threshold -Has been evaluated by cardiology as well- 2-D echo is unrevealing-no further recommendations per cardiology  Chest tightness -Episode lasted for about 15 minutes- EKG unrevealing and troponin x 3 sets negative  Numbness in left leg Will do MRI lumbar spine to further evaluate     S/P aortic valve replacement -Resumed Coumadin with heparin bridge for goal INR of 2.0 per neurology and  cardiology    Post-concussion headache -PRN Tylenol and Tramadol is apparently ineffective- d/c Tramadol, start PRN Vicodin  CAD status post CABG -Hold aspirin  Essential hypertension -Continue metoprolol  Morbid obesity Body mass index is 41.2 kg/(m^2).  Hyperlipidemia - Continue Crestor   Code Status:     Code Status Orders        Start     Ordered   03/17/15 0015  Full code   Continuous     03/17/15 0015     Family Communication: Wife at bedside Disposition Plan: Home when INR greater than 2 - f/u MRI DVT prophylaxis: Heparin infusion  Consultants:Neurology, cardiology, neurosurgery   Antibiotics: Anti-infectives    None      Objective: Filed Weights   03/16/15 1536  Weight: 145.605 kg (321 lb)   No intake or output data in the 24 hours ending 03/26/15 1444   Vitals Filed Vitals:   03/26/15 0220 03/26/15 0605 03/26/15 0900 03/26/15 1429  BP: 106/48 112/77 106/53 92/57  Pulse: 41 80 98 91  Temp: 98 F (36.7 C) 97.6 F (36.4 C) 98 F (36.7 C) 98.8 F (37.1 C)  TempSrc: Oral Oral Oral Oral  Resp: Height:      Weight:      SpO2: 96% 96% 96% 96%    Exam:  General:  Pt is alert, not in acute distress  HEENT: No icterus, No thrush, oral mucosa moist  Cardiovascular: regular rate and rhythm, S1/S2 No murmur + mechanical click  Respiratory: clear to auscultation bilaterally   Abdomen:  Soft, +Bowel sounds, non tender, non distended, no guarding  MSK: No LE edema, cyanosis or clubbing  Data Reviewed: Basic Metabolic Panel: No results for input(s): NA, K, CL, CO2, GLUCOSE, BUN, CREATININE, CALCIUM, MG, PHOS in the last 168 hours. Liver Function Tests: No results for input(s): AST, ALT, ALKPHOS, BILITOT, PROT, ALBUMIN in the last 168 hours. No results for input(s): LIPASE, AMYLASE in the last 168 hours. No results for input(s): AMMONIA in the last 168 hours. CBC:  Recent Labs Lab 03/22/15 0537 03/23/15 1029 03/24/15 0720  03/25/15 0030 03/26/15 0440  WBC 6.1 5.8 6.0 6.1 6.5  HGB 11.9* 12.7* 12.0* 12.0* 12.3*  HCT 39.4 40.6 39.0 38.6* 40.4  MCV 92.7 93.1 92.4 92.6 92.7  PLT 178 189 176 189 180   Cardiac Enzymes:  Recent Labs Lab 03/24/15 1454 03/24/15 1913 03/25/15 0030  TROPONINI 0.03 0.03 0.03   BNP (last 3 results) No results for input(s): BNP in the last 8760 hours.  ProBNP (last 3 results)  Recent Labs  04/21/14 1437  PROBNP 40.3    CBG:  Recent Labs Lab 03/25/15 1123 03/25/15 1704 03/25/15 2123 03/26/15 0647 03/26/15 1122  GLUCAP 82 127* 100* 90 89    No results found for this or any previous visit (from the past 240 hour(s)).   Studies: Mr Lodema PilotBrain W Wo Contrast  03/25/2015  CLINICAL DATA:  Left leg weakness. History of left subdural hematoma. On Coumadin EXAM: MRI HEAD WITHOUT AND WITH CONTRAST TECHNIQUE: Multiplanar, multiecho pulse sequences of the brain and surrounding structures were obtained without and with intravenous contrast. CONTRAST:  20mL MULTIHANCE GADOBENATE DIMEGLUMINE 529 MG/ML IV SOLN COMPARISON:  CT 03/22/2015.  MRI 03/16/2015 next FINDINGS: Subacute left-sided subdural hematoma shows improvement since the prior MRI. This now measures up to approximately 5 mm in thickness but most of it is 3 mm in thickness. On FLAIR imaging subdural fluid is also noted along the falx which was present previously as well. There is mild 3mm midline shift to the right which may be slightly improved. No new area of hemorrhage Negative for hydrocephalus Negative for acute ischemic infarction. No significant chronic infarct. Negative for mass or edema. Postcontrast imaging degraded by motion. There is dural thickening and enhancement over the left convexity and along the falx related to subacute subdural hematoma. IMPRESSION: Subacute left-sided subdural hematoma shows improvement since the prior MRI of 03/16/2015. There is 3 mm midline shift to the right which is slightly improved. No  new area of hemorrhage Negative for acute infarct. Electronically Signed   By: Marlan Palauharles  Clark M.D.   On: 03/25/2015 13:53    Scheduled Meds:  Scheduled Meds: . acetaminophen  1,000 mg Oral 3 times per day  . gabapentin  400 mg Oral TID  . levETIRAcetam  500 mg Oral BID  . metoprolol succinate  50 mg Oral Daily  . rosuvastatin  10 mg Oral Daily  . warfarin  15 mg Oral ONCE-1800  . Warfarin - Pharmacist Dosing Inpatient   Does not apply q1800   Continuous Infusions: . heparin 1,600 Units/hr (03/26/15 0306)    Time spent on care of this patient: 35  min   Nikki Glanzer, MD 03/26/2015, 2:44 PM  LOS: 9 days   [facility] Office  [phone] Pager - Text Page per [email or web address] If 7PM-7AM, please contact night-coverage [email or web address]

## 2015-03-26 NOTE — Progress Notes (Deleted)
Inpatient Rehabilitation  Patient was screened by Naksh Radi for appropriateness for an Inpatient Acute Rehab consult.  At this time, we are recommending Inpatient Rehab consult.  Please order when you feel appropriate.   Aras Albarran PT Inpatient Rehab Admissions Coordinator Cell [phone] Office [phone]   

## 2015-03-27 ENCOUNTER — Other Ambulatory Visit: Payer: Self-pay

## 2015-03-27 ENCOUNTER — Inpatient Hospital Stay (HOSPITAL_COMMUNITY): Payer: BLUE CROSS/BLUE SHIELD

## 2015-03-27 LAB — GLUCOSE, CAPILLARY
GLUCOSE-CAPILLARY: 95 mg/dL (ref 65–99)
GLUCOSE-CAPILLARY: 86 mg/dL (ref 65–99)
Glucose-Capillary: 96 mg/dL (ref 65–99)
Glucose-Capillary: 105 mg/dL — ABNORMAL HIGH (ref 65–99)

## 2015-03-27 LAB — PROTIME-INR
INR: 1.82 — ABNORMAL HIGH (ref 0.00–1.49)
Prothrombin Time: 21 seconds — ABNORMAL HIGH (ref 11.6–15.2)

## 2015-03-27 LAB — CBC
HCT: 41 % (ref 39.0–52.0)
HEMOGLOBIN: 12.8 g/dL — AB (ref 13.0–17.0)
MCH: 28.8 pg (ref 26.0–34.0)
MCHC: 31.2 g/dL (ref 30.0–36.0)
MCV: 92.3 fL (ref 78.0–100.0)
Platelets: 177 10*3/uL (ref 150–400)
RBC: 4.44 MIL/uL (ref 4.22–5.81)
RDW: 14.8 % (ref 11.5–15.5)
WBC: 6.4 10*3/uL (ref 4.0–10.5)

## 2015-03-27 LAB — HEPARIN LEVEL (UNFRACTIONATED): Heparin Unfractionated: 0.35 IU/mL (ref 0.30–0.70)

## 2015-03-27 LAB — TROPONIN I: Troponin I: 0.03 ng/mL (ref <0.031)

## 2015-03-27 MED ORDER — LORAZEPAM 2 MG/ML IJ SOLN
1.0000 mg | Freq: Once | INTRAMUSCULAR | Status: AC
  Administered 2015-03-27: 1 mg via INTRAVENOUS
  Filled 2015-03-27: qty 1

## 2015-03-27 MED ORDER — WARFARIN - PHYSICIAN DOSING INPATIENT: Freq: Every day | Status: DC

## 2015-03-27 MED ORDER — WARFARIN SODIUM 7.5 MG PO TABS
15.0000 mg | ORAL_TABLET | Freq: Once | ORAL | Status: AC
  Administered 2015-03-27: 15 mg via ORAL
  Filled 2015-03-27: qty 2

## 2015-03-27 NOTE — Progress Notes (Signed)
ANTICOAGULATION CONSULT NOTE - Follow Up Consult  Pharmacy Consult for Heparin Indication: mechanical AVR  Allergies  Allergen Reactions  . Nsaids Other (See Comments)    Told not to take meds-per patient    Patient Measurements: Height: 6\' 2"  (188 cm) Weight: (!) 321 lb (145.605 kg) IBW/kg (Calculated) : 82.2 Heparin Dosing Weight: 116 kg  Vital Signs: Temp: 97.8 F (36.6 C) (10/18 1005) Temp Source: Oral (10/18 1005) BP: 103/51 mmHg (10/18 1005) Pulse Rate: 68 (10/18 1005)  Labs:  Recent Labs  03/24/15 1913  03/25/15 0030 03/26/15 0440 03/27/15 0533 03/27/15 0535 03/27/15 0703  HGB  --   < > 12.0* 12.3*  --  12.8*  --   HCT  --   --  38.6* 40.4  --  41.0  --   PLT  --   --  189 180  --  177  --   LABPROT  --   --  17.4* 18.5* 21.0*  --   --   INR  --   --  1.41 1.53* 1.82*  --   --   HEPARINUNFRC  --   --  0.47 0.46 0.35  --   --   TROPONINI 0.03  --  0.03  --   --   --  0.03  < > = values in this interval not displayed.  Estimated Creatinine Clearance: 156.9 mL/min (by C-G formula based on Cr of 0.69).   Assessment: 54 y/o male on chronic warfarin for mechanical AVR with seizure activity and fall on 10/4. CT head revealed subacute SDH.   Heparin level is therapeutic today at 0.35 on 1600 units/hr. No bleeding noted, CBC is stable.  INR is subtherapeutic at 1.82 but trending up. MD has dosed warfarin the last 2 days so took out pharmacy dosing protocol. Further dosing per MD. I text paged Dr. Susie CassetteAbrol this info also.  Home Coumadin regimen: 7.5 mg daily except 3.75 mg on Fridays.   Goal of Therapy:  Heparin level 0.3-0.5 units/ml Monitor platelets by anticoagulation protocol: Yes   Plan:  - No heparin boluses with SDH - Continue heparin drip at 1600 units/hr - Coumadin per MD dosing - Daily heparin level, PT/INR and CBC in am.  Loura BackJennifer Wendell, Pharm.D., BCPS Clinical Pharmacist Pager: [phone] 03/27/2015 11:28 AM

## 2015-03-27 NOTE — Progress Notes (Addendum)
Pt complaining of right side chest pain and tightness. Vitals signs taken and were stable. Complains of no sob. EKG performed. Md notified and reviewed EKG. Order given for one time Troponin level. Pt reports that pain is resolving. RN will continue to monitor.

## 2015-03-27 NOTE — Progress Notes (Signed)
[facility] Progress Note   [name]  ONG:[id number]  DOB: Sep 06, 1960  DOA: 03/16/2015 PCP: Gwynneth Aliment, MD  Brief narrative: [name] is a 54 y.o. male with history of hypertension, CAD status post CABG, a mechanical aortic valve on Coumadin presents to the hospital for headaches. According to the history, he reported a seizure (vs syncope on 10/4) a few days prior to admission. He was seen in the ER and was discharged home with an outpatient neurology referral however continued to have headaches. On the day he was admitted, according to history obtained from his wife, he had an episode where he was "staring off" and poorly responsive. Imaging in the ER revealed a subdural hematoma. He was started on IV Keppra in the ER. INR on admission was 1.8- Coumadin was held and the patient was placed on a heparin infusion EEG performed on 10/8 did not show any seizure activity or predisposition for seizure.   Subjective: Complains today of left leg being numb above knee, patient ambulated 200 feet with physical therapy yesterday. Chest pain early this morning, EKG negative,  Assessment/Plan: Principal Problem:   SDH (subdural hematoma) -getting smaller on repeat MRI  -In setting of Coumadin use-Coumadin initially held & patient placed on heparin infusion - Due to mechanical valve, Coumadin subsequently resumed on 10/11 with a goal INR of 2.0 per neurology    Syncope versus seizure 10/4   questionable  seizure-like activity - Continue Keppra per neurology- DC Ultram, which he takes at home, as it can decrease seizure threshold -Has been evaluated by cardiology as well- 2-D echo is unrevealing-no further recommendations per cardiology, cardiac enzymes negative several times during this admission EKG unchanged   Chest tightness/chest pain -Episode lasted for about 15 minutes- EKG unrevealing and troponin x 3 sets negative,   Numbness in left leg MRI of the  lumbar spine:No finding to explain the patient's symptoms. The central canal and foramina appear open at all levels with very mild disc bulging from L3-4 to L5-S1 noted. Facet degenerative change appears worst on the right at L5-S1.    S/P aortic valve replacement -Resumed Coumadin with heparin bridge for goal INR of 2.0 per neurology and cardiology. INR 1.82    Post-concussion headache -PRN Tylenol and Tramadol is apparently ineffective- d/c Tramadol, start PRN Vicodin  CAD status post CABG -Hold aspirin  Essential hypertension -Continue metoprolol  Morbid obesity Body mass index is 41.2 kg/(m^2).  Hyperlipidemia - Continue Crestor   Code Status:     Code Status Orders        Start     Ordered   03/17/15 0015  Full code   Continuous     03/17/15 0015     Family Communication: Wife at bedside Disposition Plan: Home when INR greater than 2 - f/u MRI  DVT prophylaxis: Heparin infusion  Consultants:Neurology, cardiology, neurosurgery   Antibiotics: Anti-infectives    None      Objective: Filed Weights   03/16/15 1536  Weight: 145.605 kg (321 lb)    Intake/Output Summary (Last 24 hours) at 03/27/15 1306 Last data filed at 03/27/15 0900  Gross per 24 hour  Intake    360 ml  Output      0 ml  Net    360 ml     Vitals Filed Vitals:   03/26/15 2100 03/27/15 0101 03/27/15 0606 03/27/15 1005  BP: 103/55 109/41 108/55 103/51  Pulse: 51 54 66 68  Temp: 98.2 F (36.8 C)  98.3 F (36.8 C) 98 F (36.7 C) 97.8 F (36.6 C)  TempSrc: Oral Oral  Oral  Resp: 20 20 20 20   Height:      Weight:      SpO2: 95% 97% 97% 96%    Exam:  General:  Pt is alert, not in acute distress  HEENT: No icterus, No thrush, oral mucosa moist  Cardiovascular: regular rate and rhythm, S1/S2 No murmur + mechanical click  Respiratory: clear to auscultation bilaterally   Abdomen: Soft, +Bowel sounds, non tender, non distended, no guarding  MSK: No LE edema, cyanosis or  clubbing  Data Reviewed: Basic Metabolic Panel: No results for input(s): NA, K, CL, CO2, GLUCOSE, BUN, CREATININE, CALCIUM, MG, PHOS in the last 168 hours. Liver Function Tests: No results for input(s): AST, ALT, ALKPHOS, BILITOT, PROT, ALBUMIN in the last 168 hours. No results for input(s): LIPASE, AMYLASE in the last 168 hours. No results for input(s): AMMONIA in the last 168 hours. CBC:  Recent Labs Lab 03/23/15 1029 03/24/15 0720 03/25/15 0030 03/26/15 0440 03/27/15 0535  WBC 5.8 6.0 6.1 6.5 6.4  HGB 12.7* 12.0* 12.0* 12.3* 12.8*  HCT 40.6 39.0 38.6* 40.4 41.0  MCV 93.1 92.4 92.6 92.7 92.3  PLT 189 176 189 180 177   Cardiac Enzymes:  Recent Labs Lab 03/24/15 1454 03/24/15 1913 03/25/15 0030 03/27/15 0703  TROPONINI 0.03 0.03 0.03 0.03   BNP (last 3 results) No results for input(s): BNP in the last 8760 hours.  ProBNP (last 3 results)  Recent Labs  04/21/14 1437  PROBNP 40.3    CBG:  Recent Labs Lab 03/26/15 0647 03/26/15 1122 03/26/15 1624 03/26/15 2052 03/27/15 0648  GLUCAP 90 89 101* 125* 95    No results found for this or any previous visit (from the past 240 hour(s)).   Studies: Mr Lodema PilotBrain W Wo Contrast  03/25/2015  CLINICAL DATA:  Left leg weakness. History of left subdural hematoma. On Coumadin EXAM: MRI HEAD WITHOUT AND WITH CONTRAST TECHNIQUE: Multiplanar, multiecho pulse sequences of the brain and surrounding structures were obtained without and with intravenous contrast. CONTRAST:  20mL MULTIHANCE GADOBENATE DIMEGLUMINE 529 MG/ML IV SOLN COMPARISON:  CT 03/22/2015.  MRI 03/16/2015 next FINDINGS: Subacute left-sided subdural hematoma shows improvement since the prior MRI. This now measures up to approximately 5 mm in thickness but most of it is 3 mm in thickness. On FLAIR imaging subdural fluid is also noted along the falx which was present previously as well. There is mild 3mm midline shift to the right which may be slightly improved. No new  area of hemorrhage Negative for hydrocephalus Negative for acute ischemic infarction. No significant chronic infarct. Negative for mass or edema. Postcontrast imaging degraded by motion. There is dural thickening and enhancement over the left convexity and along the falx related to subacute subdural hematoma. IMPRESSION: Subacute left-sided subdural hematoma shows improvement since the prior MRI of 03/16/2015. There is 3 mm midline shift to the right which is slightly improved. No new area of hemorrhage Negative for acute infarct. Electronically Signed   By: Marlan Palauharles  Clark M.D.   On: 03/25/2015 13:53   Mr Lumbar Spine Wo Contrast  03/27/2015  CLINICAL DATA:  Lateral left leg numbness extending from the hip to the knee which has markedly worsened over the past 3 days. No known injury. Subsequent encounter. EXAM: MRI LUMBAR SPINE WITHOUT CONTRAST TECHNIQUE: Multiplanar, multisequence MR imaging of the lumbar spine was performed. No intravenous contrast was administered. COMPARISON:  Plain films  lumbar spine 05/30/2014. MRI lumbar spine 02/28/2005. FINDINGS: Vertebral body height, signal and alignment are maintained. The conus medullaris is normal in signal and position imaged intra-abdominal contents are unremarkable. The T10-11 and T11-12 levels are imaged in the sagittal plane only and negative. T12-L1:  Negative. L1-2:  Negative. L2-3: There is some facet degenerative disease. Otherwise negative. L3-4: Very shallow disc bulge and mild facet degenerative change without central canal or foraminal narrowing. L4-5: There is some facet degenerative disease and a shallow disc bulge. The central canal and foramina appear open. L5-S1: Facet arthropathy appears worse on the right and there is a shallow disc bulge without central canal or foraminal stenosis. IMPRESSION: No finding to explain the patient's symptoms. The central canal and foramina appear open at all levels with very mild disc bulging from L3-4 to L5-S1  noted. Facet degenerative change appears worst on the right at L5-S1. Electronically Signed   By: Drusilla Kanner M.D.   On: 03/27/2015 12:55    Scheduled Meds:  Scheduled Meds: . acetaminophen  1,000 mg Oral 3 times per day  . gabapentin  400 mg Oral TID  . levETIRAcetam  500 mg Oral BID  . metoprolol succinate  50 mg Oral Daily  . rosuvastatin  10 mg Oral Daily  . warfarin  15 mg Oral ONCE-1800  . Warfarin - Physician Dosing Inpatient   Does not apply q1800   Continuous Infusions: . heparin 1,600 Units/hr (03/26/15 2016)    Time spent on care of this patient: 35  min   Lance Lowe, MD 03/27/2015, 1:06 PM  LOS: 10 days   [facility] Office  [phone] Pager - Text Page per [email or web address] If 7PM-7AM, please contact night-coverage [email or web address]

## 2015-03-28 LAB — COMPREHENSIVE METABOLIC PANEL
ALT: 39 U/L (ref 17–63)
AST: 18 U/L (ref 15–41)
Albumin: 3.3 g/dL — ABNORMAL LOW (ref 3.5–5.0)
Alkaline Phosphatase: 73 U/L (ref 38–126)
Anion gap: 9 (ref 5–15)
BILIRUBIN TOTAL: 0.5 mg/dL (ref 0.3–1.2)
BUN: 15 mg/dL (ref 6–20)
CHLORIDE: 102 mmol/L (ref 101–111)
CO2: 26 mmol/L (ref 22–32)
CREATININE: 0.84 mg/dL (ref 0.61–1.24)
Calcium: 9 mg/dL (ref 8.9–10.3)
GFR calc Af Amer: >60 mL/min (ref >60)
GFR calc non Af Amer: >60 mL/min (ref >60)
Glucose, Bld: 99 mg/dL (ref 65–99)
Potassium: 4.2 mmol/L (ref 3.5–5.1)
Sodium: 137 mmol/L (ref 135–145)
Total Protein: 6.6 g/dL (ref 6.5–8.1)

## 2015-03-28 LAB — CBC
HCT: 39.8 % (ref 39.0–52.0)
HEMOGLOBIN: 12.2 g/dL — AB (ref 13.0–17.0)
MCH: 28.2 pg (ref 26.0–34.0)
MCHC: 30.7 g/dL (ref 30.0–36.0)
MCV: 92.1 fL (ref 78.0–100.0)
Platelets: 167 10*3/uL (ref 150–400)
RBC: 4.32 MIL/uL (ref 4.22–5.81)
RDW: 14.8 % (ref 11.5–15.5)
WBC: 6.9 10*3/uL (ref 4.0–10.5)

## 2015-03-28 LAB — PROTIME-INR
INR: 2.05 — ABNORMAL HIGH (ref 0.00–1.49)
PROTHROMBIN TIME: 23 s — AB (ref 11.6–15.2)

## 2015-03-28 LAB — GLUCOSE, CAPILLARY
Glucose-Capillary: 179 mg/dL — ABNORMAL HIGH (ref 65–99)
Glucose-Capillary: 151 mg/dL — ABNORMAL HIGH (ref 65–99)

## 2015-03-28 LAB — HEPARIN LEVEL (UNFRACTIONATED): Heparin Unfractionated: 0.28 IU/mL — ABNORMAL LOW (ref 0.30–0.70)

## 2015-03-28 MED ORDER — DIVALPROEX SODIUM 250 MG PO DR TAB: 250.0000 mg | DELAYED_RELEASE_TABLET | Freq: Two times a day (BID) | ORAL | Status: DC

## 2015-03-28 MED ORDER — GABAPENTIN 400 MG PO CAPS: 400.0000 mg | ORAL_CAPSULE | Freq: Three times a day (TID) | ORAL | Status: DC

## 2015-03-28 MED ORDER — HYDROCODONE-ACETAMINOPHEN 5-325 MG PO TABS
1.0000 | ORAL_TABLET | Freq: Once | ORAL | Status: AC
  Administered 2015-03-28: 1 via ORAL
  Filled 2015-03-28: qty 1

## 2015-03-28 MED ORDER — DEXAMETHASONE SODIUM PHOSPHATE 4 MG/ML IJ SOLN
4.0000 mg | Freq: Once | INTRAMUSCULAR | Status: AC
  Administered 2015-03-28: 4 mg via INTRAVENOUS
  Filled 2015-03-28: qty 1

## 2015-03-28 MED ORDER — OXYCODONE HCL 5 MG PO CAPS: 5.0000 mg | ORAL_CAPSULE | ORAL | Status: DC | PRN

## 2015-03-28 MED ORDER — HYDROCODONE-ACETAMINOPHEN 5-325 MG PO TABS: 2.0000 | ORAL_TABLET | Freq: Once | ORAL | Status: DC

## 2015-03-28 MED ORDER — VALPROATE SODIUM 500 MG/5ML IV SOLN
500.0000 mg | Freq: Once | INTRAVENOUS | Status: AC
  Administered 2015-03-28: 500 mg via INTRAVENOUS
  Filled 2015-03-28 (×2): qty 5

## 2015-03-28 MED ORDER — WARFARIN SODIUM 5 MG PO TABS: ORAL_TABLET | ORAL | Status: DC

## 2015-03-28 MED ORDER — LEVETIRACETAM 500 MG PO TABS: 500.0000 mg | ORAL_TABLET | Freq: Two times a day (BID) | ORAL | Status: DC

## 2015-03-28 NOTE — Care Management Note (Signed)
Case Management Note  Patient Details  Name: [name] MRN: [id number] Date of Birth: December 29, 1960  Subjective/Objective:                    Action/Plan: Patient being discharged today with home health services. CM met with the patient and his wife and provided them with a list of home health agencies in the [facility] area. They selected [place]. CM called Miranda with [facility] and she accepted the referral. Miranda did state that the patients insurance would not pay for an aide. CM will notify [name]. Bedside RN updated.   Expected Discharge Date:                  Expected Discharge Plan:  [place]  In-House Referral:     Discharge planning Services  Follow-up appt scheduled, CM Consult  Post Acute Care Choice:    Choice offered to:  Patient, Spouse  DME Arranged:    DME Agency:     HH Arranged:  PT, OT, Nurse's Aide [place] Agency:  [place]  Status of Service:  Completed, signed off  Medicare Important Message Given:    Date Medicare IM Given:    Medicare IM give by:    Date Additional Medicare IM Given:    Additional Medicare Important Message give by:     If discussed at [place] of Stay Meetings, dates discussed:    Additional Comments:  Pollie Friar, RN 03/28/2015, 10:58 AM

## 2015-03-28 NOTE — Progress Notes (Signed)
RN discussed discharge instructions with patient. Patient and family vocalized understanding of medications, follow up appointments. Prescriptions and discharge instructions given to patient. Patient stated his wife has already made follow up appointments as requested by MD. Patient is getting dressed, patient to be escorted by RN to car via wheelchair.

## 2015-03-28 NOTE — Discharge Instructions (Signed)
Seizure, Adult A seizure is abnormal electrical activity in the brain. Seizures usually last from 30 seconds to 2 minutes. There are various types of seizures. Before a seizure, you may have a warning sensation (aura) that a seizure is about to occur. An aura may include the following symptoms:   Fear or anxiety.  Nausea.  Feeling like the room is spinning (vertigo).  Vision changes, such as seeing flashing lights or spots. Common symptoms during a seizure include:  A change in attention or behavior (altered mental status).  Convulsions with rhythmic jerking movements.  Drooling.  Rapid eye movements.  Grunting.  Loss of bladder and bowel control.  Bitter taste in the mouth.  Tongue biting. After a seizure, you may feel confused and sleepy. You may also have an injury resulting from convulsions during the seizure. HOME CARE INSTRUCTIONS   If you are given medicines, take them exactly as prescribed by your health care provider.  Keep all follow-up appointments as directed by your health care provider.  Do not swim or drive or engage in risky activity during which a seizure could cause further injury to you or others until your health care provider says it is OK.  Get adequate rest.  Teach friends and family what to do if you have a seizure. They should:  Lay you on the ground to prevent a fall.  Put a cushion under your head.  Loosen any tight clothing around your neck.  Turn you on your side. If vomiting occurs, this helps keep your airway clear.  Stay with you until you recover.  Know whether or not you need emergency care. SEEK IMMEDIATE MEDICAL CARE IF:  The seizure lasts longer than 5 minutes.  The seizure is severe or you do not wake up immediately after the seizure.  You have an altered mental status after the seizure.  You are having more frequent or worsening seizures. Someone should drive you to the emergency department or call local emergency  services (911 in [place] MAKE SURE YOU:  Understand these instructions.  Will watch your condition.  Will get help right away if you are not doing well or get worse.   This information is not intended to replace advice given to you by your health care provider. Make sure you discuss any questions you have with your health care provider.   Document Released: 05/23/2000 Document Revised: 06/16/2014 Document Reviewed: 01/05/2013 Elsevier Interactive Patient Education 2016 [employer].   Subdural Hematoma A subdural hematoma is a collection of blood between the brain and its tough outermost membrane covering (the dura). Blood clots that form in this area push down on the brain and cause irritation. A subdural hematoma may cause parts of the brain to stop working and eventually cause death.  CAUSES A subdural hematoma is caused by bleeding from a ruptured blood vessel (hemorrhage). The bleeding results from trauma to the head, such as from a fall or motor vehicle accident. There are two types of subdural hemorrhages:  Acute. This type develops shortly after a serious blow to the head and causes blood to collect very quickly. If not diagnosed and treated promptly, severe brain injury or death can occur.  Chronic. This is when bleeding develops more slowly, over weeks or months. RISK FACTORS People at risk for subdural hematoma include older persons, infants, and alcoholics. SYMPTOMS An acute subdural hemorrhage develops over minutes to hours. Symptoms can include:  Temporary loss of consciousness.  Weakness of arms or legs on one  side of the body.  Changes in vision or speech.  A severe headache.  Seizures.  Nausea and vomiting.  Increased sleepiness. A chronic subdural hemorrhage develops over weeks to months. Symptoms may develop slowly and produce less noticeable problems or changes. Symptoms include:  A mild headache.  A change in personality.  Loss of balance or  difficulty walking.  Weakness, numbness, or tingling in the arms or legs.  Nausea or vomiting.  Memory loss.  Double vision.  Increased sleepiness. DIAGNOSIS Your health care provider will perform a thorough physical and neurological exam. A CT scan or MRI may also be done. If there is blood on the scan, its color will help your health care provider determine how long the hemorrhage has been there. TREATMENT If the cause is an acute subdural hemorrhage, immediate treatment is needed. In many cases an emergency surgery is performed to drain accumulated blood or to remove the blood clot. Sometimes steroid or diuretic medicines or controlled breathing through a ventilator is needed to decrease pressure in the brain. This is especially true if there is any swelling of the brain. If the cause is a chronic subdural hemorrhage, treatment depends on a variety of factors. Sometimes no treatment is needed. If the subdural hematoma is small and causes minimal or no symptoms, you may be treated with bed rest, medicines, and observation. If the hemorrhage is large or if you have neurological symptoms, an emergency surgery is usually needed to remove the blood clot. People who develop a subdural hemorrhage are at risk of developing seizures, even after the subdural hematoma has been treated. You may be prescribed an anti-seizure (anticonvulsant) medicine for a year or longer. HOME CARE INSTRUCTIONS  Only take medicines as directed by your health care provider.  Rest if directed by your health care provider.  Keep all follow-up appointments with your health care provider.  If you play a contact sport such as football, hockey or soccer and you experienced a significant head injury, allow enough time for healing (up to 15 days) before you start playing again. A repeated injury that occurs during this fragile repair period is likely to result in hemorrhage. This is called the second impact syndrome. SEEK  IMMEDIATE MEDICAL CARE IF:  You fall or experience minor trauma to your head and you are taking blood thinners. If you are on any blood thinners even a very small injury can cause a subdural hematoma. You should not hesitate to seek medical attention regardless of how minor you think your symptoms are.  You experience a head injury and have:  Drowsiness or a decrease in alertness.  Confusion or forgetfulness.  Slurred speech.  Irrational or aggressive behavior.  Numbness or paralysis in any part of the body.  A feeling of being sick to your stomach (nauseous) or you throw up (vomit).  Difficulty walking or poor coordination.  Double vision.  Seizures.  A bleeding disorder.  A history of heavy alcohol use.  Clear fluid draining from your nose or ears.  Personality changes.  Difficulty thinking.  Worsening symptoms. MAKE SURE YOU:  Understand these instructions.  Will watch your condition.  Will get help right away if you are not doing well or get worse. FOR MORE INFORMATION National Institute of Neurological Disorders and Stroke: [employer] American Association of Neurological Surgeons: www.neurosurgerytoday.org American Academy of Neurology (AAN): [employer] Brain Injury Association of America: www.biausa.org   This information is not intended to replace advice given to you by your health care  provider. Make sure you discuss any questions you have with your health care provider.   Document Released: 04/12/2004 Document Revised: 03/16/2013 Document Reviewed: 11/26/2012 Elsevier Interactive Patient Education 2016 Elsevier Inc.  Gabapentin capsules or tablets What is this medicine? GABAPENTIN (GA ba pen tin) is used to control partial seizures in adults with epilepsy. It is also used to treat certain types of nerve pain. This medicine may be used for other purposes; ask your health care provider or pharmacist if you have questions. What should I tell my  health care provider before I take this medicine? They need to know if you have any of these conditions: -kidney disease -suicidal thoughts, plans, or attempt; a previous suicide attempt by you or a family member -an unusual or allergic reaction to gabapentin, other medicines, foods, dyes, or preservatives -pregnant or trying to get pregnant -breast-feeding How should I use this medicine? Take this medicine by mouth with a glass of water. Follow the directions on the prescription label. You can take it with or without food. If it upsets your stomach, take it with food.Take your medicine at regular intervals. Do not take it more often than directed. Do not stop taking except on your doctor's advice. If you are directed to break the 600 or 800 mg tablets in half as part of your dose, the extra half tablet should be used for the next dose. If you have not used the extra half tablet within 28 days, it should be thrown away. A special MedGuide will be given to you by the pharmacist with each prescription and refill. Be sure to read this information carefully each time. Talk to your pediatrician regarding the use of this medicine in children. Special care may be needed. Overdosage: If you think you have taken too much of this medicine contact a poison control center or emergency room at once. NOTE: This medicine is only for you. Do not share this medicine with others. What if I miss a dose? If you miss a dose, take it as soon as you can. If it is almost time for your next dose, take only that dose. Do not take double or extra doses. What may interact with this medicine? Do not take this medicine with any of the following medications: -other gabapentin products This medicine may also interact with the following medications: -alcohol -antacids -antihistamines for allergy, cough and cold -certain medicines for anxiety or sleep -certain medicines for depression or psychotic disturbances -homatropine;  hydrocodone -naproxen -narcotic medicines (opiates) for pain -phenothiazines like chlorpromazine, mesoridazine, prochlorperazine, thioridazine This list may not describe all possible interactions. Give your health care provider a list of all the medicines, herbs, non-prescription drugs, or dietary supplements you use. Also tell them if you smoke, drink alcohol, or use illegal drugs. Some items may interact with your medicine. What should I watch for while using this medicine? Visit your doctor or health care professional for regular checks on your progress. You may want to keep a record at home of how you feel your condition is responding to treatment. You may want to share this information with your doctor or health care professional at each visit. You should contact your doctor or health care professional if your seizures get worse or if you have any new types of seizures. Do not stop taking this medicine or any of your seizure medicines unless instructed by your doctor or health care professional. Stopping your medicine suddenly can increase your seizures or their severity. Wear a medical  identification bracelet or chain if you are taking this medicine for seizures, and carry a card that lists all your medications. You may get drowsy, dizzy, or have blurred vision. Do not drive, use machinery, or do anything that needs mental alertness until you know how this medicine affects you. To reduce dizzy or fainting spells, do not sit or stand up quickly, especially if you are an older patient. Alcohol can increase drowsiness and dizziness. Avoid alcoholic drinks. Your mouth may get dry. Chewing sugarless gum or sucking hard candy, and drinking plenty of water will help. The use of this medicine may increase the chance of suicidal thoughts or actions. Pay special attention to how you are responding while on this medicine. Any worsening of mood, or thoughts of suicide or dying should be reported to your health  care professional right away. Women who become pregnant while using this medicine may enroll in the [place] Antiepileptic Drug Pregnancy Registry by calling [phone]. This registry collects information about the safety of antiepileptic drug use during pregnancy. What side effects may I notice from receiving this medicine? Side effects that you should report to your doctor or health care professional as soon as possible: -allergic reactions like skin rash, itching or hives, swelling of the face, lips, or tongue -worsening of mood, thoughts or actions of suicide or dying Side effects that usually do not require medical attention (report to your doctor or health care professional if they continue or are bothersome): -constipation -difficulty walking or controlling muscle movements -dizziness -nausea -slurred speech -tiredness -tremors -weight gain This list may not describe all possible side effects. Call your doctor for medical advice about side effects. You may report side effects to FDA at 1-800-FDA-1088. Where should I keep my medicine? Keep out of reach of children. This medicine may cause accidental overdose and death if it taken by other adults, children, or pets. Mix any unused medicine with a substance like cat litter or coffee grounds. Then throw the medicine away in a sealed container like a sealed bag or a coffee can with a lid. Do not use the medicine after the expiration date. Store at room temperature between 15 and 30 degrees C (59 and 86 degrees F). NOTE: This sheet is a summary. It may not cover all possible information. If you have questions about this medicine, talk to your doctor, pharmacist, or health care provider.    2016, Elsevier/Gold Standard. (2013-07-22 15:26:50)  Levetiracetam tablets What is this medicine? LEVETIRACETAM (lee ve tye RA se tam) is an antiepileptic drug. It is used with other medicines to treat certain types of seizures. This medicine  may be used for other purposes; ask your health care provider or pharmacist if you have questions. What should I tell my health care provider before I take this medicine? They need to know if you have any of these conditions: -kidney disease -suicidal thoughts, plans, or attempt; a previous suicide attempt by you or a family member -an unusual or allergic reaction to levetiracetam, other medicines, foods, dyes, or preservatives -pregnant or trying to get pregnant -breast-feeding How should I use this medicine? Take this medicine by mouth with a glass of water. Follow the directions on the prescription label. Swallow the tablets whole. Do not crush or chew this medicine. You may take this medicine with or without food. Take your doses at regular intervals. Do not take your medicine more often than directed. Do not stop taking this medicine or any of your seizure medicines  unless instructed by your doctor or health care professional. Stopping your medicine suddenly can increase your seizures or their severity. A special MedGuide will be given to you by the pharmacist with each prescription and refill. Be sure to read this information carefully each time. Contact your pediatrician or health care professional regarding the use of this medication in children. While this drug may be prescribed for children as young as 35 years of age for selected conditions, precautions do apply. Overdosage: If you think you have taken too much of this medicine contact a poison control center or emergency room at once. NOTE: This medicine is only for you. Do not share this medicine with others. What if I miss a dose? If you miss a dose, take it as soon as you can. If it is almost time for your next dose, take only that dose. Do not take double or extra doses. What may interact with this medicine? This medicine may interact with the following medications: -carbamazepine -colesevelam -probenecid -sevelamer This list may  not describe all possible interactions. Give your health care provider a list of all the medicines, herbs, non-prescription drugs, or dietary supplements you use. Also tell them if you smoke, drink alcohol, or use illegal drugs. Some items may interact with your medicine. What should I watch for while using this medicine? Visit your doctor or health care professional for a regular check on your progress. Wear a medical identification bracelet or chain to say you have epilepsy, and carry a card that lists all your medications. It is important to take this medicine exactly as instructed by your health care professional. When first starting treatment, your dose may need to be adjusted. It may take weeks or months before your dose is stable. You should contact your doctor or health care professional if your seizures get worse or if you have any new types of seizures. You may get drowsy or dizzy. Do not drive, use machinery, or do anything that needs mental alertness until you know how this medicine affects you. Do not stand or sit up quickly, especially if you are an older patient. This reduces the risk of dizzy or fainting spells. Alcohol may interfere with the effect of this medicine. Avoid alcoholic drinks. The use of this medicine may increase the chance of suicidal thoughts or actions. Pay special attention to how you are responding while on this medicine. Any worsening of mood, or thoughts of suicide or dying should be reported to your health care professional right away. Women who become pregnant while using this medicine may enroll in the [place] Antiepileptic Drug Pregnancy Registry by calling [phone]. This registry collects information about the safety of antiepileptic drug use during pregnancy. What side effects may I notice from receiving this medicine? Side effects you should report to your doctor or health care professional as soon as possible: -allergic reactions like skin rash,  itching or hives, swelling of the face, lips, or tongue -breathing problems -dark urine -general ill feeling or flu-like symptoms -problems with balance, talking, walking -unusually weak or tired -worsening of mood, thoughts or actions of suicide or dying -yellowing of the eyes or skin Side effects that usually do not require medical attention (report to your doctor or health care professional if they continue or are bothersome): -diarrhea -dizzy, drowsy -headache -loss of appetite This list may not describe all possible side effects. Call your doctor for medical advice about side effects. You may report side effects to FDA at 1-800-FDA-1088. Where  should I keep my medicine? Keep out of reach of children. Store at room temperature between 15 and 30 degrees C (59 and 86 degrees F). Throw away any unused medicine after the expiration date. NOTE: This sheet is a summary. It may not cover all possible information. If you have questions about this medicine, talk to your doctor, pharmacist, or health care provider.    2016, Elsevier/Gold Standard. (2013-04-19 08:42:48)

## 2015-03-28 NOTE — Progress Notes (Signed)
Pt states that his left leg pain is 10/10 with no relief from vicodin and tylenol.  Pt unable to walk to bathroom due to pain in his leg.  MD notified.  Will continue to monitor.   Estanislado EmmsAshley Schwarz, RN

## 2015-03-28 NOTE — Progress Notes (Signed)
Patient being escorted to car by RN and tech. Patient took all belongings including wife's jewelry. Patient taken via wheelchair.

## 2015-03-28 NOTE — Discharge Summary (Signed)
Physician Discharge Summary  [name] MRN: [id number] DOB/AGE: 56-Mar-1962 56 y.o.  PCP: Maximino Greenland, MD   Admit date: 03/16/2015 Discharge date: 03/28/2015  Discharge Diagnoses:     Principal Problem:   SDH (subdural hematoma) (HCC) Active Problems:   S/P aortic valve replacement   Long term current use of anticoagulant   Post-concussion headache   Faintness   Left leg weakness    Follow-up recommendations Follow-up with PCP in 3-5 days , including all  additional recommended appointments as below Follow-up CBC, CMP in 3-5 days Follow-up [facility] in 1-2 weeks     Medication List    STOP taking these medications        aspirin 81 MG EC tablet     cholecalciferol 1000 UNITS tablet  Commonly known as:  VITAMIN D     TALTZ [place]     traMADol 50 MG tablet  Commonly known as:  ULTRAM     VICTOZA [place]      TAKE these medications        divalproex 250 MG DR tablet  Commonly known as:  DEPAKOTE  Take 1 tablet (250 mg total) by mouth 2 (two) times daily.     gabapentin 400 MG capsule  Commonly known as:  NEURONTIN  Take 1 capsule (400 mg total) by mouth 3 (three) times daily.     levETIRAcetam 500 MG tablet  Commonly known as:  KEPPRA  Take 1 tablet (500 mg total) by mouth 2 (two) times daily.     metoprolol succinate 50 MG 24 hr tablet  Commonly known as:  TOPROL-XL  Take 50 mg by mouth daily.     multivitamin with minerals Tabs tablet  Take 1 tablet by mouth daily.     oxycodone 5 MG capsule  Commonly known as:  OXY-IR  Take 1 capsule (5 mg total) by mouth every 4 (four) hours as needed.     rosuvastatin 10 MG tablet  Commonly known as:  CRESTOR  Take 10 mg by mouth daily.     warfarin 5 MG tablet  Commonly known as:  COUMADIN  Monday Wednesday Friday Sunday 7.5 Tuesday Thursday Saturday-10 mg         Discharge Condition: Stable  Disposition: Home with home health   Consultants:Neurology, cardiology, neurosurgery    Significant Diagnostic Studies:  Ct Head Wo Contrast  03/22/2015  CLINICAL DATA:  Numbness on the left side of body with generalized headache. Subdural hematoma. EXAM: CT HEAD WITHOUT CONTRAST TECHNIQUE: Contiguous axial images were obtained from the base of the skull through the vertex without intravenous contrast. COMPARISON:  Three days ago FINDINGS: Skull and Sinuses:Mild mucosal edema in the paranasal sinuses. Orbits: Right cataract resection. Brain: The isodense left subdural hematoma is difficult to visualize. No progressive mass effect or new high density to suggest interval hemorrhage. No evidence of infarct, hydrocephalus, mass lesion. IMPRESSION: Known small left cerebral convexity subdural hematoma is isodense and difficult visualize. No interval increase or new abnormality. Electronically Signed   By: Monte Fantasia M.D.   On: 03/22/2015 10:00   Ct Head Wo Contrast  03/19/2015  CLINICAL DATA:  Subdural hematoma EXAM: CT HEAD WITHOUT CONTRAST TECHNIQUE: Contiguous axial images were obtained from the base of the skull through the vertex without intravenous contrast. COMPARISON:  03/13/2015 FINDINGS: Skull and Sinuses:Diminished right frontal scalp swelling. No acute osseous finding. Orbits: Right cataract resection.  No traumatic finding. Brain: Isodense subdural hematoma along the left cerebral convexity, maximal at  the high left frontal level is stable in size with maximal thickness of 7 mm. Midline shift is stable at 4 mm to the right. No ischemic changes. No new focus of intracranial hemorrhage. No hydrocephalus. IMPRESSION: Unchanged isodense subdural hematoma around the left cerebral convexity with 4 mm midline shift. Electronically Signed   By: Monte Fantasia M.D.   On: 03/19/2015 07:07   Ct Head Wo Contrast  03/13/2015  CLINICAL DATA:  Head and neck pain. EXAM: CT HEAD WITHOUT CONTRAST CT CERVICAL SPINE WITHOUT CONTRAST TECHNIQUE: Multidetector CT imaging of the head and cervical  spine was performed following the standard protocol without intravenous contrast. Multiplanar CT image reconstructions of the cervical spine were also generated. COMPARISON:  CT scan of head of May 30, 2014. FINDINGS: CT HEAD FINDINGS Mild right frontal scalp hematoma is noted. Bony calvarium appears intact. No mass effect or midline shift is noted. Ventricular size is within normal limits. There is no evidence of mass lesion, hemorrhage or acute infarction. CT CERVICAL SPINE FINDINGS No fracture or spondylolisthesis is noted. Moderate degenerative disc disease is noted at C5-6 and C6-7 with anterior osteophyte formation. Posterior facet joints appear intact. Visualized lung apices appear normal. IMPRESSION: Mild right frontal scalp hematoma. No acute intracranial abnormality seen. Moderate degenerative disc disease is noted at C5-6 and C6-7. No acute abnormality seen in the cervical spine. Electronically Signed   By: Marijo Conception, M.D.   On: 03/13/2015 18:18   Ct Cervical Spine Wo Contrast  03/13/2015  CLINICAL DATA:  Head and neck pain. EXAM: CT HEAD WITHOUT CONTRAST CT CERVICAL SPINE WITHOUT CONTRAST TECHNIQUE: Multidetector CT imaging of the head and cervical spine was performed following the standard protocol without intravenous contrast. Multiplanar CT image reconstructions of the cervical spine were also generated. COMPARISON:  CT scan of head of May 30, 2014. FINDINGS: CT HEAD FINDINGS Mild right frontal scalp hematoma is noted. Bony calvarium appears intact. No mass effect or midline shift is noted. Ventricular size is within normal limits. There is no evidence of mass lesion, hemorrhage or acute infarction. CT CERVICAL SPINE FINDINGS No fracture or spondylolisthesis is noted. Moderate degenerative disc disease is noted at C5-6 and C6-7 with anterior osteophyte formation. Posterior facet joints appear intact. Visualized lung apices appear normal. IMPRESSION: Mild right frontal scalp  hematoma. No acute intracranial abnormality seen. Moderate degenerative disc disease is noted at C5-6 and C6-7. No acute abnormality seen in the cervical spine. Electronically Signed   By: Marijo Conception, M.D.   On: 03/13/2015 18:18   Mr Brain Wo Contrast  03/16/2015  CLINICAL DATA:  Newly diagnosed seizures after a syncopal episode with fall. Headache. Visual field defect. EXAM: MRI HEAD WITHOUT CONTRAST TECHNIQUE: Multiplanar, multiecho pulse sequences of the brain and surrounding structures were obtained without intravenous contrast. COMPARISON:  Head CT in 03/13/2015 FINDINGS: There is a small volume, likely subacute subdural hematoma over the left cerebral convexity, largest in the frontal region where it measures up to 6 mm in thickness. There is mild left cerebral hemispheric sulcal effacement, and there is 4 mm of rightward midline shift. A small amount of subdural hemorrhage is also present in the interhemispheric fissure along the left aspect of the falx. There is no hydrocephalus. No acute infarct, parenchymal hemorrhage, or mass is seen. The hippocampi are grossly symmetric in size and signal, however detailed evaluation is precluded by prominent motion artifact on thin section images through the temporal lobes. No significant white matter disease is seen.  Cerebral volume is normal for age. Prior right cataract extraction is noted. There is mild left maxillary sinus mucosal thickening. Mastoid air cells are clear. Major intracranial vascular flow voids are preserved. IMPRESSION: Small subdural hematoma over the left cerebral convexity and along the falx. 4 mm rightward midline shift. Critical Value/emergent results were called by telephone at the time of interpretation on 03/16/2015 at 9:17 pm to Dr. Ralene Bathe, who verbally acknowledged these results. Electronically Signed   By: Logan Bores M.D.   On: 03/16/2015 21:21   Mr Jeri Cos WG Contrast  03/25/2015  CLINICAL DATA:  Left leg weakness. History of  left subdural hematoma. On Coumadin EXAM: MRI HEAD WITHOUT AND WITH CONTRAST TECHNIQUE: Multiplanar, multiecho pulse sequences of the brain and surrounding structures were obtained without and with intravenous contrast. CONTRAST:  68m MULTIHANCE GADOBENATE DIMEGLUMINE 529 MG/ML IV SOLN COMPARISON:  CT 03/22/2015.  MRI 03/16/2015 next FINDINGS: Subacute left-sided subdural hematoma shows improvement since the prior MRI. This now measures up to approximately 5 mm in thickness but most of it is 3 mm in thickness. On FLAIR imaging subdural fluid is also noted along the falx which was present previously as well. There is mild 361mmidline shift to the right which may be slightly improved. No new area of hemorrhage Negative for hydrocephalus Negative for acute ischemic infarction. No significant chronic infarct. Negative for mass or edema. Postcontrast imaging degraded by motion. There is dural thickening and enhancement over the left convexity and along the falx related to subacute subdural hematoma. IMPRESSION: Subacute left-sided subdural hematoma shows improvement since the prior MRI of 03/16/2015. There is 3 mm midline shift to the right which is slightly improved. No new area of hemorrhage Negative for acute infarct. Electronically Signed   By: ChFranchot Gallo.D.   On: 03/25/2015 13:53   Mr Lumbar Spine Wo Contrast  03/27/2015  CLINICAL DATA:  Lateral left leg numbness extending from the hip to the knee which has markedly worsened over the past 3 days. No known injury. Subsequent encounter. EXAM: MRI LUMBAR SPINE WITHOUT CONTRAST TECHNIQUE: Multiplanar, multisequence MR imaging of the lumbar spine was performed. No intravenous contrast was administered. COMPARISON:  Plain films lumbar spine 05/30/2014. MRI lumbar spine 02/28/2005. FINDINGS: Vertebral body height, signal and alignment are maintained. The conus medullaris is normal in signal and position imaged intra-abdominal contents are unremarkable. The  T10-11 and T11-12 levels are imaged in the sagittal plane only and negative. T12-L1:  Negative. L1-2:  Negative. L2-3: There is some facet degenerative disease. Otherwise negative. L3-4: Very shallow disc bulge and mild facet degenerative change without central canal or foraminal narrowing. L4-5: There is some facet degenerative disease and a shallow disc bulge. The central canal and foramina appear open. L5-S1: Facet arthropathy appears worse on the right and there is a shallow disc bulge without central canal or foraminal stenosis. IMPRESSION: No finding to explain the patient's symptoms. The central canal and foramina appear open at all levels with very mild disc bulging from L3-4 to L5-S1 noted. Facet degenerative change appears worst on the right at L5-S1. Electronically Signed   By: ThInge Rise.D.   On: 03/27/2015 12:55      Filed Weights   03/16/15 1536  Weight: 145.605 kg (321 lb)     Microbiology: No results found for this or any previous visit (from the past 240 hour(s)).     Blood Culture    Component Value Date/Time   SDES URINE, RANDOM 05/14/2014 099562  SPECREQUEST NONE 05/14/2014 0943   CULT  05/14/2014 0943    Multiple bacterial morphotypes present, none predominant. Suggest appropriate recollection if clinically indicated. Performed at [facility] 05/15/2014 FINAL 05/14/2014 0943      Labs: Results for orders placed or performed during the hospital encounter of 03/16/15 (from the past 48 hour(s))  Glucose, capillary     Status: Abnormal   Collection Time: 03/26/15  4:24 PM  Result Value Ref Range   Glucose-Capillary 101 (H) 65 - 99 mg/dL  Glucose, capillary     Status: Abnormal   Collection Time: 03/26/15  8:52 PM  Result Value Ref Range   Glucose-Capillary 125 (H) 65 - 99 mg/dL   Comment 1 Notify RN    Comment 2 Document in Chart   Heparin level (unfractionated)     Status: None   Collection Time: 03/27/15  5:33 AM  Result  Value Ref Range   Heparin Unfractionated 0.35 0.30 - 0.70 IU/mL    Comment:        IF HEPARIN RESULTS ARE BELOW EXPECTED VALUES, AND PATIENT DOSAGE HAS BEEN CONFIRMED, SUGGEST FOLLOW UP TESTING OF ANTITHROMBIN III LEVELS.   Protime-INR     Status: Abnormal   Collection Time: 03/27/15  5:33 AM  Result Value Ref Range   Prothrombin Time 21.0 (H) 11.6 - 15.2 seconds   INR 1.82 (H) 0.00 - 1.49  CBC     Status: Abnormal   Collection Time: 03/27/15  5:35 AM  Result Value Ref Range   WBC 6.4 4.0 - 10.5 K/uL   RBC 4.44 4.22 - 5.81 MIL/uL   Hemoglobin 12.8 (L) 13.0 - 17.0 g/dL   HCT 41.0 39.0 - 52.0 %   MCV 92.3 78.0 - 100.0 fL   MCH 28.8 26.0 - 34.0 pg   MCHC 31.2 30.0 - 36.0 g/dL   RDW 14.8 11.5 - 15.5 %   Platelets 177 150 - 400 K/uL  Glucose, capillary     Status: None   Collection Time: 03/27/15  6:48 AM  Result Value Ref Range   Glucose-Capillary 95 65 - 99 mg/dL   Comment 1 Notify RN    Comment 2 Document in Chart   Troponin I     Status: None   Collection Time: 03/27/15  7:03 AM  Result Value Ref Range   Troponin I 0.03 <0.031 ng/mL    Comment:        NO INDICATION OF MYOCARDIAL INJURY.   Glucose, capillary     Status: None   Collection Time: 03/27/15  1:07 PM  Result Value Ref Range   Glucose-Capillary 86 65 - 99 mg/dL  Glucose, capillary     Status: None   Collection Time: 03/27/15  4:15 PM  Result Value Ref Range   Glucose-Capillary 96 65 - 99 mg/dL   Comment 1 Notify RN    Comment 2 Document in Chart   Glucose, capillary     Status: Abnormal   Collection Time: 03/27/15  9:42 PM  Result Value Ref Range   Glucose-Capillary 105 (H) 65 - 99 mg/dL   Comment 1 Notify RN    Comment 2 Document in Chart   CBC     Status: Abnormal   Collection Time: 03/28/15  4:59 AM  Result Value Ref Range   WBC 6.9 4.0 - 10.5 K/uL   RBC 4.32 4.22 - 5.81 MIL/uL   Hemoglobin 12.2 (L) 13.0 - 17.0 g/dL   HCT 39.8 39.0 -  52.0 %   MCV 92.1 78.0 - 100.0 fL   MCH 28.2 26.0 - 34.0 pg    MCHC 30.7 30.0 - 36.0 g/dL   RDW 14.8 11.5 - 15.5 %   Platelets 167 150 - 400 K/uL  Comprehensive metabolic panel     Status: Abnormal   Collection Time: 03/28/15  4:59 AM  Result Value Ref Range   Sodium 137 135 - 145 mmol/L   Potassium 4.2 3.5 - 5.1 mmol/L   Chloride 102 101 - 111 mmol/L   CO2 26 22 - 32 mmol/L   Glucose, Bld 99 65 - 99 mg/dL   BUN 15 6 - 20 mg/dL   Creatinine, Ser 0.84 0.61 - 1.24 mg/dL   Calcium 9.0 8.9 - 10.3 mg/dL   Total Protein 6.6 6.5 - 8.1 g/dL   Albumin 3.3 (L) 3.5 - 5.0 g/dL   AST 18 15 - 41 U/L   ALT 39 17 - 63 U/L   Alkaline Phosphatase 73 38 - 126 U/L   Total Bilirubin 0.5 0.3 - 1.2 mg/dL   GFR calc non Af Amer >60 >60 mL/min   GFR calc Af Amer >60 >60 mL/min    Comment: (NOTE) The eGFR has been calculated using the CKD EPI equation. This calculation has not been validated in all clinical situations. eGFR's persistently <60 mL/min signify possible Chronic Kidney Disease.    Anion gap 9 5 - 15  Heparin level (unfractionated)     Status: Abnormal   Collection Time: 03/28/15  5:00 AM  Result Value Ref Range   Heparin Unfractionated 0.28 (L) 0.30 - 0.70 IU/mL    Comment:        IF HEPARIN RESULTS ARE BELOW EXPECTED VALUES, AND PATIENT DOSAGE HAS BEEN CONFIRMED, SUGGEST FOLLOW UP TESTING OF ANTITHROMBIN III LEVELS.   Protime-INR     Status: Abnormal   Collection Time: 03/28/15  5:00 AM  Result Value Ref Range   Prothrombin Time 23.0 (H) 11.6 - 15.2 seconds   INR 2.05 (H) 0.00 - 1.49  Glucose, capillary     Status: Abnormal   Collection Time: 03/28/15  6:38 AM  Result Value Ref Range   Glucose-Capillary 179 (H) 65 - 99 mg/dL   Comment 1 Notify RN    Comment 2 Document in Chart   Glucose, capillary     Status: Abnormal   Collection Time: 03/28/15 11:50 AM  Result Value Ref Range   Glucose-Capillary 151 (H) 65 - 99 mg/dL     Lipid Panel  No results found for: CHOL, TRIG, HDL, CHOLHDL, VLDL, LDLCALC, LDLDIRECT   No results found  for: HGBA1C   Lab Results  Component Value Date   CREATININE 0.84 03/28/2015     HPI :[name] is a 54 y.o. male with history of hypertension, CAD status post CABG, a mechanical aortic valve on Coumadin presents to the hospital for headaches. According to the history, he reported a seizure (vs syncope on 10/4) a few days prior to admission. He was seen in the ER and was discharged home with an outpatient neurology referral however continued to have headaches. On the day he was admitted, according to history obtained from his wife, he had an episode where he was "staring off" and poorly responsive. Imaging in the ER revealed a subdural hematoma. He was started on IV Keppra in the ER. INR on admission was 1.8- Coumadin was held and the patient was placed on a heparin infusion EEG performed on 10/8  did not show any seizure activity or predisposition for seizure  HOSPITAL COURSE:  SDH (subdural hematoma) -getting smaller on repeat MRI  -In setting of Coumadin use-Coumadin initially held & patient placed on heparin infusion - Due to mechanical valve, Coumadin subsequently resumed on 10/11 with a goal INR of 2.0 per neurology Patient did have some residual headache, discussed with Dr. Nicole Kindred, neurology and he recommended IV Depakene, for the headache the patient has been placed on Depakote 250 by mouth twice a day for headache prophylaxis   Syncope versus seizure 10/4  questionable seizure-like activity, patient placed on Keppra - Continue Keppra per neurology- DC Ultram, which he takes at home, as it can decrease seizure threshold -Has been evaluated by cardiology as well- 2-D echo is unrevealing-no further recommendations per cardiology, cardiac enzymes negative several times during this admission EKG unchanged   Chest tightness/chest pain -Episode lasted for about 15 minutes- EKG unrevealing and troponin x 3 sets negative,   Numbness in left leg MRI of the lumbar  spine:No finding to explain the patient's symptoms. The central canal and foramina appear open at all levels with very mild disc bulging from L3-4 to L5-S1 noted. Facet degenerative change appears worst on the right at L5-S1. May benefit from EMG/nerve conduction study Patient needs to follow-up with the neurologist   S/P aortic valve replacement -Resumed Coumadin with heparin bridge for goal INR of 2.0 per neurology and cardiology. INR 1.82   Post-concussion headache -PRN Tylenol and Tramadol is apparently ineffective- d/c Tramadol, start PRN Vicodin  CAD status post CABG -Hold aspirin  Essential hypertension -Continue metoprolol  Morbid obesity Body mass index is 41.2 kg/(m^2).  Hyperlipidemia - Continue Crestor     Discharge Exam: *   Blood pressure 122/71, pulse 93, temperature 98.2 F (36.8 C), temperature source Oral, resp. rate 20, height _0  (1.88 m), weight 145.605 kg (321 lb), SpO2 92 %.    General: Pt is alert, not in acute distress  HEENT: No icterus, No thrush, oral mucosa moist  Cardiovascular: regular rate and rhythm, S1/S2 No murmur + mechanical click  Respiratory: clear to auscultation bilaterally   Abdomen: Soft, +Bowel sounds, non tender, non distended, no guarding  MSK: No LE edema, cyanosis or clubbing      Discharge Instructions    Diet - low sodium heart healthy    Complete by:  As directed      Increase activity slowly    Complete by:  As directed            Follow-up Information    Follow up with [facility] On 03/23/2015.   Why:  Office is on [employer] time is 12:15      Follow up with Maximino Greenland, MD. Schedule an appointment as soon as possible for a visit in 3 days.   Specialty:  Internal Medicine   Why:  Apptointment was already made by Dr. Baird Cancer for Friday, 03/30/15 at 3:15pm   Contact information:   [address] [place] [address] [phone]       Follow up  with Neurology. Schedule an appointment as soon as possible for a visit in 1 week.   Why:  Appointmernt made for Wednesday, 04/04/15 at 8:30 am.  Patient must be there by 8am   Contact information:    [facility] Address: [address] [place], [place] [address]    Phone: [phone]      Schedule an appointment as soon  as possible for a visit with Maximino Greenland, MD.   Specialty:  Internal Medicine   Why:  Patient needs INR check on 10/21, 10/24   Contact information:   [address] [place] [address] [phone]       Signed: Reyne Dumas 03/28/2015, 12:34 PM        Time spent >45 mins

## 2015-03-30 ENCOUNTER — Ambulatory Visit (INDEPENDENT_AMBULATORY_CARE_PROVIDER_SITE_OTHER): Payer: BLUE CROSS/BLUE SHIELD | Admitting: *Deleted

## 2015-03-30 DIAGNOSIS — Z7901 Long term (current) use of anticoagulants: Secondary | ICD-10-CM | POA: Diagnosis not present

## 2015-03-30 DIAGNOSIS — I359 Nonrheumatic aortic valve disorder, unspecified: Secondary | ICD-10-CM | POA: Diagnosis not present

## 2015-03-30 DIAGNOSIS — Z5181 Encounter for therapeutic drug level monitoring: Secondary | ICD-10-CM | POA: Diagnosis not present

## 2015-03-30 DIAGNOSIS — Z952 Presence of prosthetic heart valve: Secondary | ICD-10-CM

## 2015-03-30 DIAGNOSIS — Z954 Presence of other heart-valve replacement: Secondary | ICD-10-CM

## 2015-03-30 LAB — POCT INR: INR: 3

## 2015-04-04 ENCOUNTER — Ambulatory Visit: Payer: BLUE CROSS/BLUE SHIELD | Admitting: Neurology

## 2015-04-04 ENCOUNTER — Ambulatory Visit (INDEPENDENT_AMBULATORY_CARE_PROVIDER_SITE_OTHER): Payer: BLUE CROSS/BLUE SHIELD | Admitting: *Deleted

## 2015-04-04 DIAGNOSIS — I359 Nonrheumatic aortic valve disorder, unspecified: Secondary | ICD-10-CM | POA: Diagnosis not present

## 2015-04-04 DIAGNOSIS — Z952 Presence of prosthetic heart valve: Secondary | ICD-10-CM

## 2015-04-04 DIAGNOSIS — Z954 Presence of other heart-valve replacement: Secondary | ICD-10-CM

## 2015-04-04 DIAGNOSIS — Z7901 Long term (current) use of anticoagulants: Secondary | ICD-10-CM | POA: Diagnosis not present

## 2015-04-04 DIAGNOSIS — Z5181 Encounter for therapeutic drug level monitoring: Secondary | ICD-10-CM

## 2015-04-04 LAB — POCT INR: INR: 2.4

## 2015-04-05 ENCOUNTER — Ambulatory Visit (INDEPENDENT_AMBULATORY_CARE_PROVIDER_SITE_OTHER): Payer: BLUE CROSS/BLUE SHIELD | Admitting: Neurology

## 2015-04-05 ENCOUNTER — Encounter (INDEPENDENT_AMBULATORY_CARE_PROVIDER_SITE_OTHER): Payer: Self-pay

## 2015-04-05 ENCOUNTER — Encounter: Payer: Self-pay | Admitting: Neurology

## 2015-04-05 VITALS — BP 123/69 | HR 63 | Ht 73.0 in | Wt 323.5 lb

## 2015-04-05 DIAGNOSIS — I6200 Nontraumatic subdural hemorrhage, unspecified: Secondary | ICD-10-CM

## 2015-04-05 DIAGNOSIS — S065X9A Traumatic subdural hemorrhage with loss of consciousness of unspecified duration, initial encounter: Secondary | ICD-10-CM

## 2015-04-05 DIAGNOSIS — S065XAA Traumatic subdural hemorrhage with loss of consciousness status unknown, initial encounter: Secondary | ICD-10-CM

## 2015-04-05 NOTE — Progress Notes (Signed)
Reason for visit: Seizure  Referring physician: [place]  [name] is a 54 y.o. male  History of present illness:  [name] is a 54 year old right-handed black male with a history of a blackout, and probable seizure at work. The patient had the event on 03/13/2015. The patient has no recollection of events for about an hour prior to the episode. The patient had been on Victoza prior to the event. The patient had a witnessed event where he suddenly vocalized, was noted to be clenching the arms in flexion, and then fell over, striking the right frontotemporal area of his head. The patient had a generalized seizure event. The patient was taken to the hospital, a CT scan of the brain initially was unremarkable. The patient has an aortic valve replacement, and he is on Coumadin for his mechanical heart valve. The patient went home, he reported headaches in the right frontotemporal region. His wife began noticing that he was acting in unusual ways, he left the burner on the stove, and eventually she began noticing brief episodes of staring off, with unresponsiveness. The patient was brought back to the hospital 3 or 4 days following initial seizure, and a CT scan of brain showed evidence of a small left subdural hematoma. The patient was admitted to the hospital, the patient was seen by neurosurgery, but surgery was not recommended. The subdural was followed over time, initially the Coumadin was held, but it was restarted prior to his discharge. The subdural hematoma appeared to be resolving by MRI prior to discharge. The patient apparently was placed on Depakote for his headaches, and Keppra for the seizures. The patient has not had any further staring episodes following discharge, but he continues to have headaches. He is sent to this office for further evaluation. He reports some change in balance, he is getting occupational and physical therapy at home currently. He has not returned to  work. The patient indicates that his work entails climbing heights. The patient has also noted an area of dysesthesias on the anterolateral aspect of the left thigh. MRI of the low back done in the hospital was unremarkable.  Past Medical History  Diagnosis Date  . S/P aortic valve and root replacement     HX of St. Jude  . Chest pain, atypical   . Hypertension     Unspecified  . Hyperlipidemia     Mixed  . Obesity   . Sleep apnea, obstructive   . Coronary artery reimplantation   . Ascending aortic aneurysm and dissection     2006 repaired  7 cm aneurysm    Past Surgical History  Procedure Laterality Date  . Replacement of aortic valve  12/06/2004  . Ascending aortic dissection aneurysm  12/06/2004    ok for MRI 1.5 or 3T Using a 27-mm St. Jude mechanical valve conduit with reimplantation of both coronary arteries  . Knee surgery      R  . Corneal transplant      R     Family History  Problem Relation Age of Onset  . Cervical cancer Mother   . Coronary artery disease Mother   . Diabetes Mother   . Diabetes Father   . Cervical cancer Sister     Social history:  reports that he quit smoking about 25 years ago. His smoking use included Cigarettes. He has a 1.8 pack-year smoking history. He has quit using smokeless tobacco. He reports that he does not drink alcohol or use  illicit drugs.  Medications:  Prior to Admission medications   Medication Sig Start Date End Date Taking? Authorizing Provider  acetaminophen (TYLENOL) 500 MG tablet Take 500 mg by mouth every 6 (six) hours as needed.    Historical Provider, MD  aspirin 81 MG tablet Take 81 mg by mouth daily.    Historical Provider, MD  cholecalciferol (VITAMIN D) 1000 UNITS tablet Take 1,000 Units by mouth daily.    Historical Provider, MD  divalproex (DEPAKOTE) 250 MG DR tablet Take 1 tablet (250 mg total) by mouth 2 (two) times daily. 03/28/15   Richarda OverlieNayana Abrol, MD  gabapentin (NEURONTIN) 400 MG capsule Take 1 capsule (400  mg total) by mouth 3 (three) times daily. 03/28/15   Richarda OverlieNayana Abrol, MD  HYDROcodone-acetaminophen (NORCO/VICODIN) 5-325 MG tablet Take 2 tablets by mouth every 6 (six) hours as needed for moderate pain.    Historical Provider, MD  levETIRAcetam (KEPPRA) 500 MG tablet Take 1 tablet (500 mg total) by mouth 2 (two) times daily. 03/28/15   Richarda OverlieNayana Abrol, MD  Liraglutide (VICTOZA [place]) Inject 12 Units into the skin daily.    Historical Provider, MD  metoprolol succinate (TOPROL-XL) 50 MG 24 hr tablet Take 50 mg by mouth daily.  08/29/13   Historical Provider, MD  Multiple Vitamin (MULTIVITAMIN WITH MINERALS) TABS tablet Take 1 tablet by mouth daily.    Historical Provider, MD  ondansetron (ZOFRAN-ODT) 4 MG disintegrating tablet Take 4 mg by mouth every 6 (six) hours as needed for nausea or vomiting.    Historical Provider, MD  oxycodone (OXY-IR) 5 MG capsule Take 1 capsule (5 mg total) by mouth every 4 (four) hours as needed. 03/28/15   Richarda OverlieNayana Abrol, MD  oxyCODONE-acetaminophen (PERCOCET/ROXICET) 5-325 MG tablet Take 1 tablet by mouth every 4 (four) hours as needed for severe pain.    Historical Provider, MD  rosuvastatin (CRESTOR) 10 MG tablet Take 10 mg by mouth daily.     Historical Provider, MD  traMADol (ULTRAM) 50 MG tablet Take 50 mg by mouth every 6 (six) hours as needed.    Historical Provider, MD  vitamin C (ASCORBIC ACID) 500 MG tablet Take 500 mg by mouth daily.    Historical Provider, MD  warfarin (COUMADIN) 5 MG tablet Monday Wednesday Friday Sunday 7.5 Tuesday Thursday Saturday-10 mg 03/28/15   Richarda OverlieNayana Abrol, MD      Allergies  Allergen Reactions  . Nsaids Other (See Comments)    Told not to take meds-per patient    ROS:  Out of a complete 14 system review of symptoms, the patient complains only of the following symptoms, and all other reviewed systems are negative.  Memory loss, confusion, headache, numbness, weakness, dizziness, seizure, blacking out Insomnia  Blood pressure  123/69, pulse 63, height 6\' 1"  (1.854 m), weight 323 lb 8 oz (146.739 kg).  Physical Exam  General: The patient is alert and cooperative at the time of the examination. The patient is moderately to markedly obese.  Eyes: Pupils are equal, round, and reactive to light. Discs are flat bilaterally.  Neck: The neck is supple, no carotid bruits are noted.  Respiratory: The respiratory examination is clear.  Cardiovascular: The cardiovascular examination reveals a regular rate and rhythm, no obvious murmurs or rubs are noted.  Skin: Extremities are with 2+ edema below the knees bilaterally.  Neurologic Exam  Mental status: The patient is alert and oriented x 3 at the time of the examination. The patient has apparent normal recent and remote memory, with  an apparently normal attention span and concentration ability.  Cranial nerves: Facial symmetry is present. There is good sensation of the face to pinprick and soft touch on the right, decreased on the left. The patient splits the midline of the forehead with vibration, decreased on the left. The strength of the facial muscles and the muscles to head turning and shoulder shrug are normal bilaterally. Speech is well enunciated, no aphasia or dysarthria is noted. Extraocular movements are full. Visual fields are full. The tongue is midline, and the patient has symmetric elevation of the soft palate. No obvious hearing deficits are noted.  Motor: The motor testing reveals 5 over 5 strength of all 4 extremities. Good symmetric motor tone is noted throughout.  Sensory: Sensory testing is notable for a decrease in pinprick, soft touch, vibration sensation, and position sensation on the left side, more normal on the right. No evidence of extinction is noted.  Coordination: Cerebellar testing reveals good finger-nose-finger and heel-to-shin bilaterally.  Gait and station: Gait is slightly wide-based, the patient normally uses a cane. Tandem gait is  unsteady. Romberg is negative. No drift is seen.  Reflexes: Deep tendon reflexes are symmetric, but are decreased bilaterally. Toes are downgoing bilaterally.   MRI brain 03/25/15:  IMPRESSION: Subacute left-sided subdural hematoma shows improvement since the prior MRI of 03/16/2015. There is 3 mm midline shift to the right which is slightly improved. No new area of hemorrhage  Negative for acute infarct.  * MRI scan images were reviewed online. I agree with the written report.   EEG 03/17/15:  Impression: this is a normal awake EEG. Please, be aware that a normal EEG does not exclude the possibility of epilepsy.  Clinical correlation is advised.     Assessment/Plan:  1. Seizure event  2. Left subdural hematoma  3. Chronic anticoagulation  4. Nonorganic neurologic examination  The patient has suffered a generalized seizure that was associated with head trauma that eventually led to a small left-sided subdural hematoma. The patient is now on Keppra for this. The patient was placed on Depakote, but I will ask the patient to stop this medication as this does have interaction with Coumadin, and has an antiplatelet effect that may also promote bleeding. The patient has a nonorganic clinical examination. The patient likely did suffer a generalized seizure, possibly related to hypoglycemia. EMS got a blood sugar of 68 initially, the patient was on Victoza prior to the seizure. The patient is not to take the Ultram at this time. He may take hydrocodone if needed for headache. He will follow-up in 3-4 months. I will plan on rechecking a CT scan of the head in about 3 weeks. At this point, the patient is not to drive for least 6 months, he is not to climb heights for this period of time as well. The patient may be going on short-term disability.  Marlan Palau MD 04/05/2015 8:37 PM  [facility] [address] [place], [place] [address]  Phone  [phone] Fax [phone]

## 2015-04-05 NOTE — Patient Instructions (Signed)
Stop valproic acid tablets.  We will recheck a CT of the head in 2 or three weeks.  Subdural Hematoma A subdural hematoma is a collection of blood between the brain and its tough outermost membrane covering (the dura). Blood clots that form in this area push down on the brain and cause irritation. A subdural hematoma may cause parts of the brain to stop working and eventually cause death.  CAUSES A subdural hematoma is caused by bleeding from a ruptured blood vessel (hemorrhage). The bleeding results from trauma to the head, such as from a fall or motor vehicle accident. There are two types of subdural hemorrhages:  Acute. This type develops shortly after a serious blow to the head and causes blood to collect very quickly. If not diagnosed and treated promptly, severe brain injury or death can occur.  Chronic. This is when bleeding develops more slowly, over weeks or months. RISK FACTORS People at risk for subdural hematoma include older persons, infants, and alcoholics. SYMPTOMS An acute subdural hemorrhage develops over minutes to hours. Symptoms can include:  Temporary loss of consciousness.  Weakness of arms or legs on one side of the body.  Changes in vision or speech.  A severe headache.  Seizures.  Nausea and vomiting.  Increased sleepiness. A chronic subdural hemorrhage develops over weeks to months. Symptoms may develop slowly and produce less noticeable problems or changes. Symptoms include:  A mild headache.  A change in personality.  Loss of balance or difficulty walking.  Weakness, numbness, or tingling in the arms or legs.  Nausea or vomiting.  Memory loss.  Double vision.  Increased sleepiness. DIAGNOSIS Your health care provider will perform a thorough physical and neurological exam. A CT scan or MRI may also be done. If there is blood on the scan, its color will help your health care provider determine how long the hemorrhage has been  there. TREATMENT If the cause is an acute subdural hemorrhage, immediate treatment is needed. In many cases an emergency surgery is performed to drain accumulated blood or to remove the blood clot. Sometimes steroid or diuretic medicines or controlled breathing through a ventilator is needed to decrease pressure in the brain. This is especially true if there is any swelling of the brain. If the cause is a chronic subdural hemorrhage, treatment depends on a variety of factors. Sometimes no treatment is needed. If the subdural hematoma is small and causes minimal or no symptoms, you may be treated with bed rest, medicines, and observation. If the hemorrhage is large or if you have neurological symptoms, an emergency surgery is usually needed to remove the blood clot. People who develop a subdural hemorrhage are at risk of developing seizures, even after the subdural hematoma has been treated. You may be prescribed an anti-seizure (anticonvulsant) medicine for a year or longer. HOME CARE INSTRUCTIONS  Only take medicines as directed by your health care provider.  Rest if directed by your health care provider.  Keep all follow-up appointments with your health care provider.  If you play a contact sport such as football, hockey or soccer and you experienced a significant head injury, allow enough time for healing (up to 15 days) before you start playing again. A repeated injury that occurs during this fragile repair period is likely to result in hemorrhage. This is called the second impact syndrome. SEEK IMMEDIATE MEDICAL CARE IF:  You fall or experience minor trauma to your head and you are taking blood thinners. If you are  on any blood thinners even a very small injury can cause a subdural hematoma. You should not hesitate to seek medical attention regardless of how minor you think your symptoms are.  You experience a head injury and have:  Drowsiness or a decrease in alertness.  Confusion or  forgetfulness.  Slurred speech.  Irrational or aggressive behavior.  Numbness or paralysis in any part of the body.  A feeling of being sick to your stomach (nauseous) or you throw up (vomit).  Difficulty walking or poor coordination.  Double vision.  Seizures.  A bleeding disorder.  A history of heavy alcohol use.  Clear fluid draining from your nose or ears.  Personality changes.  Difficulty thinking.  Worsening symptoms. MAKE SURE YOU:  Understand these instructions.  Will watch your condition.  Will get help right away if you are not doing well or get worse. FOR MORE INFORMATION National Institute of Neurological Disorders and Stroke: [place].[place] American Association of Neurological Surgeons: www.neurosurgerytoday.org American Academy of Neurology (AAN): ComparePet.cz Brain Injury Association of America: www.biausa.org   This information is not intended to replace advice given to you by your health care provider. Make sure you discuss any questions you have with your health care provider.   Document Released: 04/12/2004 Document Revised: 03/16/2013 Document Reviewed: 11/26/2012 Elsevier Interactive Patient Education [employer]

## 2015-04-06 ENCOUNTER — Telehealth: Payer: Self-pay | Admitting: *Deleted

## 2015-04-06 DIAGNOSIS — Z0289 Encounter for other administrative examinations: Secondary | ICD-10-CM

## 2015-04-06 NOTE — Telephone Encounter (Signed)
Form,[employer] received from [place],sent to [place] and Dr Anne HahnWillis 04/06/15.

## 2015-04-09 ENCOUNTER — Encounter: Payer: Self-pay | Admitting: Neurology

## 2015-04-09 ENCOUNTER — Telehealth: Payer: Self-pay | Admitting: Neurology

## 2015-04-09 NOTE — Telephone Encounter (Signed)
I called the patient. I explained that I started his short term disability paperwork today and as soon as Dr. Anne HahnWillis has a chance to look at it I will be able to tell him how long he will need to be out of work. He verbalized understanding and mentioned that for the past couple of days he has had black spots in his vision. These spots come and go. He states the norco does not help his headache at all. He c/o having a headache constantly. I advised that I would let Dr. Anne HahnWillis know this.

## 2015-04-09 NOTE — Telephone Encounter (Signed)
I called the patient. The patient needs a work note which I will dictate. I will keep him out of work probably for least 3 weeks, until a repeat CT scan of the head can be done. He will not be able to operate a motor vehicle or heavy equipment, he will not be able to climb to [place]. 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

## 2015-04-09 NOTE — Telephone Encounter (Signed)
Patient called to inquire when to return to work? Will it be after he has MRI/CT Scan? Please call patient to advise [phone]. Has been out of work since 03/13/15 and has been using his sick and personal days. Gave paperwork to Doctor while he was in the hospital but no one knows where the paperwork is. Was advised at work that he had 14 days to sign up (deadline was last Thursday 04/05/15). Patient only has a few days left and needs to get STD.

## 2015-04-09 NOTE — Telephone Encounter (Signed)
Letter is ready at the front desk.

## 2015-04-10 ENCOUNTER — Telehealth: Payer: Self-pay | Admitting: *Deleted

## 2015-04-10 NOTE — Telephone Encounter (Signed)
Form,[employer] received,completed by Dr Anne HahnWillis and Earney NavyKelby at front desk for patient 04/10/15.

## 2015-04-10 NOTE — Telephone Encounter (Signed)
Form completed, signed and returned to Donna. 

## 2015-04-18 ENCOUNTER — Ambulatory Visit (INDEPENDENT_AMBULATORY_CARE_PROVIDER_SITE_OTHER): Payer: BLUE CROSS/BLUE SHIELD | Admitting: *Deleted

## 2015-04-18 DIAGNOSIS — Z7901 Long term (current) use of anticoagulants: Secondary | ICD-10-CM

## 2015-04-18 DIAGNOSIS — Z952 Presence of prosthetic heart valve: Secondary | ICD-10-CM

## 2015-04-18 DIAGNOSIS — Z5181 Encounter for therapeutic drug level monitoring: Secondary | ICD-10-CM

## 2015-04-18 DIAGNOSIS — I359 Nonrheumatic aortic valve disorder, unspecified: Secondary | ICD-10-CM | POA: Diagnosis not present

## 2015-04-18 DIAGNOSIS — Z954 Presence of other heart-valve replacement: Secondary | ICD-10-CM

## 2015-04-18 LAB — POCT INR: INR: 2.1

## 2015-04-19 ENCOUNTER — Telehealth: Payer: Self-pay | Admitting: Neurology

## 2015-04-19 NOTE — Telephone Encounter (Signed)
I have plans to call him on November 17 to schedule the CT.

## 2015-04-19 NOTE — Telephone Encounter (Signed)
Pt called wanting to follow up on the CT HEAD that Dr. Anne HahnWillis wanted him to follow up on. I advised the patient that I would contact him once the order is placed in. Patient can be reached @ [phone]. Thanks!

## 2015-04-23 ENCOUNTER — Telehealth: Payer: Self-pay | Admitting: Neurology

## 2015-04-23 NOTE — Telephone Encounter (Signed)
Misty StanleyLisa with [employer] called and needs clarification on pts return to work date. The letter that was sent to them was 10/31 with limitations. They need to know exactly what he can cannot do. The pt is adding restrictions. The pt's employer is also concerned about pts cognitive reasoning. Please call [phone] option 1 , ext: [phone]

## 2015-04-23 NOTE — Telephone Encounter (Signed)
I called and left a message. 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 He cannot operate heavy equipment, and he is not to climb to [place].

## 2015-04-23 NOTE — Telephone Encounter (Signed)
Pt called and wanted to know if he was going to get a call back? He was advised that Dr. Anne HahnWillis has plans to call him on Nov. 17th as indicated in tele note. Pt expressed understanding.

## 2015-04-25 ENCOUNTER — Telehealth: Payer: Self-pay | Admitting: Neurology

## 2015-04-25 DIAGNOSIS — S065X9A Traumatic subdural hemorrhage with loss of consciousness of unspecified duration, initial encounter: Secondary | ICD-10-CM

## 2015-04-25 DIAGNOSIS — S065XAA Traumatic subdural hemorrhage with loss of consciousness status unknown, initial encounter: Secondary | ICD-10-CM

## 2015-04-25 MED ORDER — LEVETIRACETAM 500 MG PO TABS: 500.0000 mg | ORAL_TABLET | Freq: Two times a day (BID) | ORAL | Status: DC

## 2015-04-25 NOTE — Telephone Encounter (Signed)
-----   Message from York Spanielharles K Davis Ambrosini, MD sent at 04/05/2015  2:46 PM EDT ----- Recheck CT head, subdural hematoma

## 2015-04-25 NOTE — Telephone Encounter (Signed)
I called the patient to set up a CT scan of the head follow-up for the subdural hematoma. I also called in a refill for his Keppra. He is to stop the Depakote.

## 2015-04-26 ENCOUNTER — Telehealth: Payer: Self-pay | Admitting: *Deleted

## 2015-04-26 NOTE — Telephone Encounter (Signed)
Form.[employer] received from [place], sent to [place] and Dr Anne HahnWillis 04/26/15.

## 2015-04-30 ENCOUNTER — Telehealth: Payer: Self-pay | Admitting: *Deleted

## 2015-04-30 NOTE — Telephone Encounter (Signed)
Form,[name] received,completed by Dr Anne HahnWillis and Earney NavyKelby at front desk for patient 04/30/15.

## 2015-04-30 NOTE — Telephone Encounter (Signed)
Form completed, signed and returned to Donna. 

## 2015-05-07 ENCOUNTER — Ambulatory Visit
Admission: RE | Admit: 2015-05-07 | Discharge: 2015-05-07 | Disposition: A | Discharge status: Home / Self Care | Payer: BLUE CROSS/BLUE SHIELD | Source: Ambulatory Visit | Attending: Neurology | Admitting: Neurology

## 2015-05-07 DIAGNOSIS — S065XAA Traumatic subdural hemorrhage with loss of consciousness status unknown, initial encounter: Secondary | ICD-10-CM

## 2015-05-07 DIAGNOSIS — I6200 Nontraumatic subdural hemorrhage, unspecified: Secondary | ICD-10-CM | POA: Diagnosis not present

## 2015-05-07 DIAGNOSIS — S065X9A Traumatic subdural hemorrhage with loss of consciousness of unspecified duration, initial encounter: Secondary | ICD-10-CM

## 2015-05-08 ENCOUNTER — Telehealth: Payer: Self-pay | Admitting: Neurology

## 2015-05-08 ENCOUNTER — Encounter: Payer: Self-pay | Admitting: Neurology

## 2015-05-08 NOTE — Telephone Encounter (Signed)
  I have called the patient. The CT of the head is ok. The patient reports ongoing headaches, dizziness with stooping and ongoing right sided weakness. I will see him back in RV,. He is ok to return to work with restrictions, no driving and no climbing to heights.  I will write a letter for work.  CT head 05/08/15:  IMPRESSION:  Normal CT head (without). Previously visualized left brain subdural hematoma no longer seen.

## 2015-05-11 ENCOUNTER — Ambulatory Visit (INDEPENDENT_AMBULATORY_CARE_PROVIDER_SITE_OTHER): Payer: BLUE CROSS/BLUE SHIELD | Admitting: *Deleted

## 2015-05-11 DIAGNOSIS — Z952 Presence of prosthetic heart valve: Secondary | ICD-10-CM

## 2015-05-11 DIAGNOSIS — I359 Nonrheumatic aortic valve disorder, unspecified: Secondary | ICD-10-CM | POA: Diagnosis not present

## 2015-05-11 DIAGNOSIS — Z7901 Long term (current) use of anticoagulants: Secondary | ICD-10-CM | POA: Diagnosis not present

## 2015-05-11 DIAGNOSIS — Z954 Presence of other heart-valve replacement: Secondary | ICD-10-CM

## 2015-05-11 DIAGNOSIS — Z5181 Encounter for therapeutic drug level monitoring: Secondary | ICD-10-CM | POA: Diagnosis not present

## 2015-05-11 LAB — POCT INR: INR: 2.4

## 2015-05-22 ENCOUNTER — Telehealth: Payer: Self-pay | Admitting: Neurology

## 2015-05-22 MED ORDER — LEVETIRACETAM ER 500 MG PO TB24: 1000.0000 mg | ORAL_TABLET | Freq: Every day | ORAL | Status: DC

## 2015-05-22 NOTE — Telephone Encounter (Signed)
Patient called back for clarification. I advised that Dr. Anne HahnWillis called in an order for extended release Keppra instead of immediate release. I advised the patient is to take 2 tablets before bedtime. I also advised that he should let us know if he is still having drowsiness with this medication change.

## 2015-05-22 NOTE — Telephone Encounter (Signed)
Patient is calling and states the Rx Levetiracetam 500 mg is making him sleepy which makes it hard to work.  He needs a letter stating this for his employer or a different medication prescribed. Please call.

## 2015-05-22 NOTE — Telephone Encounter (Signed)
I called patient. The Keppra is making him drowsy, I will switch him to the extended release preparation, have him take 2 tablets before he goes to bed. If he is still having problems, he is to let us know.

## 2015-05-30 ENCOUNTER — Telehealth: Payer: Self-pay | Admitting: Cardiovascular Disease

## 2015-05-30 NOTE — Telephone Encounter (Signed)
Called pt and left message informing him that we needed to update his Fm Hx and if he could please call our office back with the information.

## 2015-06-14 ENCOUNTER — Encounter: Payer: Self-pay | Admitting: Cardiovascular Disease

## 2015-06-14 ENCOUNTER — Ambulatory Visit (INDEPENDENT_AMBULATORY_CARE_PROVIDER_SITE_OTHER): Payer: BLUE CROSS/BLUE SHIELD | Admitting: Cardiovascular Disease

## 2015-06-14 ENCOUNTER — Ambulatory Visit (INDEPENDENT_AMBULATORY_CARE_PROVIDER_SITE_OTHER): Payer: BLUE CROSS/BLUE SHIELD | Admitting: *Deleted

## 2015-06-14 VITALS — BP 132/70 | HR 59 | Ht 73.0 in | Wt 326.0 lb

## 2015-06-14 DIAGNOSIS — I359 Nonrheumatic aortic valve disorder, unspecified: Secondary | ICD-10-CM

## 2015-06-14 DIAGNOSIS — Z5181 Encounter for therapeutic drug level monitoring: Secondary | ICD-10-CM

## 2015-06-14 DIAGNOSIS — Z954 Presence of other heart-valve replacement: Secondary | ICD-10-CM | POA: Diagnosis not present

## 2015-06-14 DIAGNOSIS — Z7901 Long term (current) use of anticoagulants: Secondary | ICD-10-CM | POA: Diagnosis not present

## 2015-06-14 DIAGNOSIS — Z952 Presence of prosthetic heart valve: Secondary | ICD-10-CM

## 2015-06-14 LAB — POCT INR: INR: 2.1

## 2015-06-14 MED ORDER — WARFARIN SODIUM 5 MG PO TABS: ORAL_TABLET | ORAL | Status: DC

## 2015-06-14 NOTE — Progress Notes (Signed)
Cardiology Office Note Date:  06/14/2015   ID:  [name], DOB 1961-03-25, MRN [id number]  PCP:  Gwynneth Aliment, MD  Cardiologist:  Tonny Bollman, MD    Chief Complaint  Patient presents with  . Cardiac Valve Problem    History of Present Illness: [name] is a 55 y.o. male who presents for follow-up evaluation.  The patient has a history of ascending aortic aneurysm and dissection , undergoing surgical treatment with a 27 mm St. Jude mechanical valve conduit and reimplantation of the coronary arteries in 2006. He's also been followed for hypertension, hyperlipidemia, and obesity.  The patient had a seizure in October 2016 with traumatic head injury. He sustained a small subdural hematoma. Anticoagulation was temporarily held. He has recovered well and did not require surgical decompression.   He denies chest pain, shortness of breath, orthopnea, or PND. He has no heart palpitations. He does have leg swelling.  Past Medical History  Diagnosis Date  . S/P aortic valve and root replacement     HX of St. Jude  . Chest pain, atypical   . Hypertension     Unspecified  . Hyperlipidemia     Mixed  . Obesity   . Sleep apnea, obstructive   . Coronary artery reimplantation   . Ascending aortic aneurysm and dissection     2006 repaired  7 cm aneurysm    Past Surgical History  Procedure Laterality Date  . Replacement of aortic valve  12/06/2004  . Ascending aortic dissection aneurysm  12/06/2004    ok for MRI 1.5 or 3T Using a 27-mm St. Jude mechanical valve conduit with reimplantation of both coronary arteries  . Knee surgery      R  . Corneal transplant      R     Current Outpatient Prescriptions  Medication Sig Dispense Refill  . aspirin 81 MG tablet Take 81 mg by mouth daily.    Marland Kitchen gabapentin (NEURONTIN) 400 MG capsule Take 1 capsule (400 mg total) by mouth 3 (three) times daily. 90 capsule 0  . levETIRAcetam (KEPPRA XR) 500 MG 24 hr tablet Take 2 tablets  (1,000 mg total) by mouth daily. 60 tablet 3  . Liraglutide (VICTOZA [place]) Inject 12 Units into the skin daily.    . metoprolol succinate (TOPROL-XL) 50 MG 24 hr tablet Take 50 mg by mouth daily.     . Multiple Vitamin (MULTIVITAMIN WITH MINERALS) TABS tablet Take 1 tablet by mouth daily.    . rosuvastatin (CRESTOR) 10 MG tablet Take 10 mg by mouth daily.     . traMADol (ULTRAM) 50 MG tablet Take 50 mg by mouth every 6 (six) hours as needed.    . vitamin C (ASCORBIC ACID) 500 MG tablet Take 500 mg by mouth daily.    Marland Kitchen warfarin (COUMADIN) 5 MG tablet 7.5 mg daily except on fridays take 5 mg tablet 120 tablet 1   No current facility-administered medications for this visit.    Allergies:   Nsaids   Social History:  The patient  reports that he quit smoking about 25 years ago. His smoking use included Cigarettes. He has a 1.8 pack-year smoking history. He has quit using smokeless tobacco. He reports that he does not drink alcohol or use illicit drugs.   Family History:  The patient's  family history includes Cervical cancer in his mother and sister; Coronary artery disease in his mother; Diabetes in his father and mother.    ROS:  Please see the history of present illness.  All other systems are reviewed and negative.    PHYSICAL EXAM: VS:  BP 132/70 mmHg  Pulse 59  Ht 6\' 1"  (1.854 m)  Wt 326 lb (147.873 kg)  BMI 43.02 kg/m2  SpO2 96% , BMI Body mass index is 43.02 kg/(m^2). GEN: Well nourished, well developed, in no acute distress HEENT: normal Neck: no JVD, no masses. No carotid bruits Cardiac: RRR with normal mechanical A2 and 2/6 systolic murmur at the LSB                Respiratory:  clear to auscultation bilaterally, normal work of breathing GI: soft, nontender, nondistended, + BS MS: no deformity or atrophy Ext: no pretibial edema, pedal pulses 2+= bilaterally Skin: warm and dry, no rash Neuro:  Strength and sensation are intact Psych: euthymic mood, full affect  EKG:  EKG  is not ordered today.  Recent Labs: 03/18/2015: Magnesium 1.9 03/28/2015: ALT 39; BUN 15; Creatinine, Ser 0.84; Hemoglobin 12.2*; Platelets 167; Potassium 4.2; Sodium 137   Lipid Panel  No results found for: CHOL, TRIG, HDL, CHOLHDL, VLDL, LDLCALC, LDLDIRECT    Wt Readings from Last 3 Encounters:  06/14/15 326 lb (147.873 kg)  04/05/15 323 lb 8 oz (146.739 kg)  03/16/15 321 lb (145.605 kg)     Cardiac Studies Reviewed: 2D Echo 03/18/2015: Study Conclusions  - Left ventricle: The cavity size was normal. Wall thickness was increased in a pattern of severe LVH. Systolic function was normal. The estimated ejection fraction was in the range of 50% to 55%. Diastolic function is abnormal, indeterminate grade. Wall motion was normal; there were no regional wall motion abnormalities. - Aortic valve: There was no stenosis. There was no significant regurgitation. Valve area (VTI): 1.71 cm^2. - Left atrium: The atrium was moderately dilated. - Right ventricle: The cavity size was mildly dilated. - Right atrium: The atrium was mildly dilated. - Technically adequate study.  ASSESSMENT AND PLAN: 1.  Aortic disease/aortic valve replacement:  Continue aspirin and warfarin. Most recent echocardiogram reviewed. Will follow-up in one year.   2. Hyperlipidemia: Treated with Crestor 10 mg daily. Lipids followed by primary care physician.  3. Chronic oral anticoagulation:  The patient had a traumatic subdural hematoma as outlined in the history of present illness. He's had no further bleeding problems. He may require dental extraction in the near future and he'll be reasonable for him to hold warfarin for a few days as needed.   4. Obesity: discussed need for diet and lifestyle modification.   Current medicines are reviewed with the patient today.  The patient does not have concerns regarding medicines.  Labs/ tests ordered today include:  No orders of the defined types were placed in  this encounter.   Disposition:   FU one year  Signed, Tonny Bollmanooper, Keeya Dyckman, MD  06/14/2015 5:54 PM    [facility] [address], [place], [place]  [address] Phone: [phone]; Fax: [phone]

## 2015-06-14 NOTE — Patient Instructions (Signed)

## 2015-07-10 ENCOUNTER — Telehealth: Payer: Self-pay | Admitting: Neurology

## 2015-07-10 NOTE — Telephone Encounter (Signed)
It does not appear we have prescribed this drug previously.  I called back and spoke with the patient.  Says he called [place] in error, asked that we disregard request, and he will contact PCP.

## 2015-07-10 NOTE — Telephone Encounter (Signed)
Pt needs refill gabapentin (NEURONTIN) 400 MG capsule, Pt is also out of medication .

## 2015-07-26 ENCOUNTER — Ambulatory Visit (INDEPENDENT_AMBULATORY_CARE_PROVIDER_SITE_OTHER): Payer: BLUE CROSS/BLUE SHIELD | Admitting: *Deleted

## 2015-07-26 DIAGNOSIS — Z954 Presence of other heart-valve replacement: Secondary | ICD-10-CM

## 2015-07-26 DIAGNOSIS — Z5181 Encounter for therapeutic drug level monitoring: Secondary | ICD-10-CM | POA: Diagnosis not present

## 2015-07-26 DIAGNOSIS — I359 Nonrheumatic aortic valve disorder, unspecified: Secondary | ICD-10-CM | POA: Diagnosis not present

## 2015-07-26 DIAGNOSIS — Z7901 Long term (current) use of anticoagulants: Secondary | ICD-10-CM

## 2015-07-26 DIAGNOSIS — Z952 Presence of prosthetic heart valve: Secondary | ICD-10-CM

## 2015-07-26 LAB — POCT INR: INR: 2.1

## 2015-08-30 ENCOUNTER — Encounter: Payer: Self-pay | Admitting: Neurology

## 2015-08-30 ENCOUNTER — Ambulatory Visit (INDEPENDENT_AMBULATORY_CARE_PROVIDER_SITE_OTHER): Payer: BLUE CROSS/BLUE SHIELD | Admitting: Neurology

## 2015-08-30 VITALS — BP 151/73 | HR 58 | Ht 74.0 in | Wt 319.5 lb

## 2015-08-30 DIAGNOSIS — R569 Unspecified convulsions: Secondary | ICD-10-CM

## 2015-08-30 MED ORDER — LEVETIRACETAM ER 500 MG PO TB24: 1000.0000 mg | ORAL_TABLET | Freq: Every day | ORAL | Status: DC

## 2015-08-30 NOTE — Progress Notes (Signed)
Reason for visit: Seizure  [name] is an 55 y.o. male  History of present illness:  [name] is a 55 year old right-handed white male with a history of a seizure that occurred on 03/13/2015. The patient was at work at the time, he suddenly went out with a generalized seizure. He had just been started on Victoza, and his blood sugar was in the 60s when he was found by EMS. The patient sustained a subdural hematoma that did not require surgical therapy. The patient has been placed on Keppra, he is tolerating the extended release preparation of the medication fairly well. The patient has not had any recurrence of seizures. He is now 6 months out from the original problem. He is currently working with restrictions. He returns for an evaluation.  Past Medical History  Diagnosis Date  . S/P aortic valve and root replacement     HX of St. Jude  . Chest pain, atypical   . Hypertension     Unspecified  . Hyperlipidemia     Mixed  . Obesity   . Sleep apnea, obstructive   . Coronary artery reimplantation   . Ascending aortic aneurysm and dissection     2006 repaired  7 cm aneurysm    Past Surgical History  Procedure Laterality Date  . Replacement of aortic valve  12/06/2004  . Ascending aortic dissection aneurysm  12/06/2004    ok for MRI 1.5 or 3T Using a 27-mm St. Jude mechanical valve conduit with reimplantation of both coronary arteries  . Knee surgery      R  . Corneal transplant      R     Family History  Problem Relation Age of Onset  . Cervical cancer Mother   . Coronary artery disease Mother   . Diabetes Mother   . Diabetes Father   . Cervical cancer Sister     Social history:  reports that he quit smoking about 25 years ago. His smoking use included Cigarettes. He has a 1.8 pack-year smoking history. He has quit using smokeless tobacco. He reports that he does not drink alcohol or use illicit drugs.    Allergies  Allergen Reactions  . Nsaids Other  (See Comments)    Told not to take meds-per patient    Medications:  Prior to Admission medications   Medication Sig Start Date End Date Taking? Authorizing Provider  aspirin 81 MG tablet Take 81 mg by mouth daily.   Yes Historical Provider, MD  gabapentin (NEURONTIN) 400 MG capsule Take 1 capsule (400 mg total) by mouth 3 (three) times daily. 03/28/15  Yes Richarda Overlie, MD  levETIRAcetam (KEPPRA XR) 500 MG 24 hr tablet Take 2 tablets (1,000 mg total) by mouth daily. 08/30/15  Yes York Spaniel, MD  Liraglutide (VICTOZA [place]) Inject 12 Units into the skin daily.   Yes Historical Provider, MD  metoprolol succinate (TOPROL-XL) 50 MG 24 hr tablet Take 50 mg by mouth daily.  08/29/13  Yes Historical Provider, MD  Multiple Vitamin (MULTIVITAMIN WITH MINERALS) TABS tablet Take 1 tablet by mouth daily.   Yes Historical Provider, MD  rosuvastatin (CRESTOR) 10 MG tablet Take 10 mg by mouth daily.    Yes Historical Provider, MD  traMADol (ULTRAM) 50 MG tablet Take 50 mg by mouth every 6 (six) hours as needed.   Yes Historical Provider, MD  vitamin C (ASCORBIC ACID) 500 MG tablet Take 500 mg by mouth daily.   Yes Historical Provider, MD  warfarin (COUMADIN) 5 MG tablet 7.5 mg daily except on fridays take 5 mg tablet 06/14/15  Yes Tonny BollmanMichael Cooper, MD    ROS:  Out of a complete 14 system review of symptoms, the patient complains only of the following symptoms, and all other reviewed systems are negative.  Leg swelling Seizure  Blood pressure 151/73, pulse 58, height 6\' 2"  (1.88 m), weight 319 lb 8 oz (144.924 kg).  Physical Exam  General: The patient is alert and cooperative at the time of the examination.  Skin: 1+ edema in ankles is noted bilaterally..   Neurologic Exam  Mental status: The patient is alert and oriented x 3 at the time of the examination. The patient has apparent normal recent and remote memory, with an apparently normal attention span and concentration ability.   Cranial  nerves: Facial symmetry is present. Speech is normal, no aphasia or dysarthria is noted. Extraocular movements are full. Visual fields are full.  Motor: The patient has good strength in all 4 extremities.  Sensory examination: Soft touch sensation is symmetric on the face, arms, and legs.  Coordination: The patient has good finger-nose-finger and heel-to-shin bilaterally.  Gait and station: The patient has a normal gait. Tandem gait is normal. Romberg is negative. No drift is seen.  Reflexes: Deep tendon reflexes are symmetric.   Assessment/Plan:  1. Seizure event, likely symptomatic seizure  The patient is doing well at this time. We will continue the Keppra at this time, he will follow-up in about 5 months. We will consider an EEG study that time and a possible taper off of Keppra if the EEG is unremarkable. The patient was given a note to return to work in full capacity at this time. He may return to driving.    A return to work note was sent to [employer], fax number [phone].   Marlan Palau. Keith Thane Age MD 08/30/2015 6:44 PM  [facility] [address] [place], [place] [address]  Phone [phone] Fax [phone]

## 2015-09-06 ENCOUNTER — Ambulatory Visit (INDEPENDENT_AMBULATORY_CARE_PROVIDER_SITE_OTHER): Payer: BLUE CROSS/BLUE SHIELD | Admitting: *Deleted

## 2015-09-06 DIAGNOSIS — Z7901 Long term (current) use of anticoagulants: Secondary | ICD-10-CM | POA: Diagnosis not present

## 2015-09-06 DIAGNOSIS — Z5181 Encounter for therapeutic drug level monitoring: Secondary | ICD-10-CM

## 2015-09-06 DIAGNOSIS — Z954 Presence of other heart-valve replacement: Secondary | ICD-10-CM | POA: Diagnosis not present

## 2015-09-06 DIAGNOSIS — I359 Nonrheumatic aortic valve disorder, unspecified: Secondary | ICD-10-CM

## 2015-09-06 DIAGNOSIS — Z952 Presence of prosthetic heart valve: Secondary | ICD-10-CM

## 2015-09-06 LAB — POCT INR: INR: 1.9

## 2015-09-06 MED ORDER — WARFARIN SODIUM 5 MG PO TABS: ORAL_TABLET | ORAL | Status: DC

## 2015-10-04 ENCOUNTER — Telehealth: Payer: Self-pay | Admitting: Neurology

## 2015-10-04 NOTE — Telephone Encounter (Signed)
Pt called said he needs copies of the letters Dr Anne HahnWillis sent to his work from 03/13/15 - 05/14/15.

## 2015-10-08 NOTE — Telephone Encounter (Addendum)
Patient called to check status of message left on Thursday. Needs copies of letters by 3:30pm today. Please call [phone].

## 2015-10-08 NOTE — Telephone Encounter (Signed)
Left message for patient to come by the office to pick up  copies of his letters.

## 2015-11-28 ENCOUNTER — Ambulatory Visit (INDEPENDENT_AMBULATORY_CARE_PROVIDER_SITE_OTHER): Payer: BLUE CROSS/BLUE SHIELD | Admitting: *Deleted

## 2015-11-28 DIAGNOSIS — I359 Nonrheumatic aortic valve disorder, unspecified: Secondary | ICD-10-CM | POA: Diagnosis not present

## 2015-11-28 DIAGNOSIS — Z5181 Encounter for therapeutic drug level monitoring: Secondary | ICD-10-CM | POA: Diagnosis not present

## 2015-11-28 DIAGNOSIS — Z954 Presence of other heart-valve replacement: Secondary | ICD-10-CM | POA: Diagnosis not present

## 2015-11-28 DIAGNOSIS — Z7901 Long term (current) use of anticoagulants: Secondary | ICD-10-CM | POA: Diagnosis not present

## 2015-11-28 DIAGNOSIS — Z952 Presence of prosthetic heart valve: Secondary | ICD-10-CM

## 2015-11-28 LAB — POCT INR: INR: 2.4

## 2016-01-10 ENCOUNTER — Ambulatory Visit (INDEPENDENT_AMBULATORY_CARE_PROVIDER_SITE_OTHER): Payer: BLUE CROSS/BLUE SHIELD | Admitting: *Deleted

## 2016-01-10 ENCOUNTER — Encounter (INDEPENDENT_AMBULATORY_CARE_PROVIDER_SITE_OTHER): Payer: Self-pay

## 2016-01-10 DIAGNOSIS — Z5181 Encounter for therapeutic drug level monitoring: Secondary | ICD-10-CM | POA: Diagnosis not present

## 2016-01-10 DIAGNOSIS — Z7901 Long term (current) use of anticoagulants: Secondary | ICD-10-CM

## 2016-01-10 DIAGNOSIS — Z952 Presence of prosthetic heart valve: Secondary | ICD-10-CM

## 2016-01-10 DIAGNOSIS — Z954 Presence of other heart-valve replacement: Secondary | ICD-10-CM | POA: Diagnosis not present

## 2016-01-10 DIAGNOSIS — I359 Nonrheumatic aortic valve disorder, unspecified: Secondary | ICD-10-CM | POA: Diagnosis not present

## 2016-01-10 LAB — POCT INR: INR: 2.9

## 2016-02-06 ENCOUNTER — Encounter: Payer: Self-pay | Admitting: Neurology

## 2016-02-06 ENCOUNTER — Ambulatory Visit (INDEPENDENT_AMBULATORY_CARE_PROVIDER_SITE_OTHER): Payer: BLUE CROSS/BLUE SHIELD | Admitting: Neurology

## 2016-02-06 VITALS — BP 119/68 | HR 62 | Ht 74.0 in | Wt 313.2 lb

## 2016-02-06 DIAGNOSIS — R569 Unspecified convulsions: Secondary | ICD-10-CM | POA: Diagnosis not present

## 2016-02-06 DIAGNOSIS — G4733 Obstructive sleep apnea (adult) (pediatric): Secondary | ICD-10-CM

## 2016-02-06 DIAGNOSIS — Z9989 Dependence on other enabling machines and devices: Principal | ICD-10-CM

## 2016-02-06 NOTE — Progress Notes (Signed)
Reason for visit: Seizures  [name] is an 55 y.o. male  History of present illness:  [name] is a 55 year old right-handed black male with a history of a single seizure event associated with hypoglycemia, head trauma, and a subdural hematoma. This event occurred on 03/13/2015. The patient is been placed on Keppra, he has done well and he has returned to work. He is back to operating motor vehicle. He has not had any new medical issues that have come up since last seen. The patient indicates that he is not having any further episodes of hypoglycemia. The patient has sleep apnea on CPAP, he does not have a sleep physician following him.  Past Medical History:  Diagnosis Date  . Ascending aortic aneurysm and dissection    2006 repaired  7 cm aneurysm  . Chest pain, atypical   . Coronary artery reimplantation   . Hyperlipidemia    Mixed  . Hypertension    Unspecified  . Obesity   . S/P aortic valve and root replacement    HX of St. Jude  . Sleep apnea, obstructive     Past Surgical History:  Procedure Laterality Date  . Ascending aortic dissection aneurysm  12/06/2004   ok for MRI 1.5 or 3T Using a 27-mm St. Jude mechanical valve conduit with reimplantation of both coronary arteries  . CORNEAL TRANSPLANT     R   . KNEE SURGERY     R  . Replacement of aortic valve  12/06/2004    Family History  Problem Relation Age of Onset  . Cervical cancer Mother   . Coronary artery disease Mother   . Diabetes Mother   . Diabetes Father   . Cervical cancer Sister     Social history:  reports that he quit smoking about 26 years ago. His smoking use included Cigarettes. He has a 1.80 pack-year smoking history. He has quit using smokeless tobacco. He reports that he does not drink alcohol or use drugs.    Allergies  Allergen Reactions  . Nsaids Other (See Comments)    Told not to take meds-per patient    Medications:  Prior to Admission medications   Medication  Sig Start Date End Date Taking? Authorizing Provider  aspirin 81 MG tablet Take 81 mg by mouth daily.   Yes Historical Provider, MD  gabapentin (NEURONTIN) 400 MG capsule Take 1 capsule (400 mg total) by mouth 3 (three) times daily. 03/28/15  Yes Richarda Overlie, MD  levETIRAcetam (KEPPRA XR) 500 MG 24 hr tablet Take 2 tablets (1,000 mg total) by mouth daily. 08/30/15  Yes York Spaniel, MD  Liraglutide (VICTOZA [place]) Inject 12 Units into the skin daily.   Yes Historical Provider, MD  metoprolol succinate (TOPROL-XL) 50 MG 24 hr tablet Take 50 mg by mouth daily.  08/29/13  Yes Historical Provider, MD  Multiple Vitamin (MULTIVITAMIN WITH MINERALS) TABS tablet Take 1 tablet by mouth daily.   Yes Historical Provider, MD  rosuvastatin (CRESTOR) 10 MG tablet Take 10 mg by mouth daily.    Yes Historical Provider, MD  traMADol (ULTRAM) 50 MG tablet Take 50 mg by mouth every 6 (six) hours as needed.   Yes Historical Provider, MD  vitamin C (ASCORBIC ACID) 500 MG tablet Take 500 mg by mouth daily.   Yes Historical Provider, MD  warfarin (COUMADIN) 5 MG tablet Take as Directed by [facility] 09/06/15  Yes Tonny Bollman, MD    ROS:  Out of a  complete 14 system review of symptoms, the patient complains only of the following symptoms, and all other reviewed systems are negative.  Leg swelling Snoring  Height 6\' 2"  (1.88 m), weight (!) 313 lb 4 oz (142.1 kg).  Physical Exam  General: The patient is alert and cooperative at the time of the examination. The patient is moderately to markedly obese.  Skin: No significant peripheral edema is noted.   Neurologic Exam  Mental status: The patient is alert and oriented x 3 at the time of the examination. The patient has apparent normal recent and remote memory, with an apparently normal attention span and concentration ability.   Cranial nerves: Facial symmetry is present. Speech is normal, no aphasia or dysarthria is noted. Extraocular movements are  full. Visual fields are full.  Motor: The patient has good strength in all 4 extremities.  Sensory examination: Soft touch sensation is symmetric on the face, arms, and legs.  Coordination: The patient has good finger-nose-finger and heel-to-shin bilaterally.  Gait and station: The patient has a normal gait. Tandem gait is normal. Romberg is negative. No drift is seen.  Reflexes: Deep tendon reflexes are symmetric, but are depressed.   Assessment/Plan:  1. History of single seizure event  2. Sleep apnea on CPAP  The patient will be referred to one of our sleep doctors to follow him for the sleep apnea on CPAP. The patient will be sent for an EEG study, if this is normal, we will initiate a taper off of Keppra. The patient otherwise will follow-up with me on an as-needed basis.  Marlan Palau. Keith Myrissa Chipley MD 02/06/2016 8:04 AM  [facility] [address] [place], [place] [id number]  Phone [phone] Fax [phone]

## 2016-02-21 ENCOUNTER — Ambulatory Visit (INDEPENDENT_AMBULATORY_CARE_PROVIDER_SITE_OTHER): Payer: BLUE CROSS/BLUE SHIELD | Admitting: *Deleted

## 2016-02-21 DIAGNOSIS — Z7901 Long term (current) use of anticoagulants: Secondary | ICD-10-CM

## 2016-02-21 DIAGNOSIS — I359 Nonrheumatic aortic valve disorder, unspecified: Secondary | ICD-10-CM | POA: Diagnosis not present

## 2016-02-21 DIAGNOSIS — Z954 Presence of other heart-valve replacement: Secondary | ICD-10-CM

## 2016-02-21 DIAGNOSIS — Z952 Presence of prosthetic heart valve: Secondary | ICD-10-CM

## 2016-02-21 DIAGNOSIS — Z5181 Encounter for therapeutic drug level monitoring: Secondary | ICD-10-CM

## 2016-02-21 LAB — POCT INR: INR: 1.8

## 2016-02-27 ENCOUNTER — Ambulatory Visit (INDEPENDENT_AMBULATORY_CARE_PROVIDER_SITE_OTHER): Payer: BLUE CROSS/BLUE SHIELD | Admitting: Neurology

## 2016-02-27 DIAGNOSIS — R569 Unspecified convulsions: Secondary | ICD-10-CM

## 2016-02-28 ENCOUNTER — Telehealth: Payer: Self-pay | Admitting: Neurology

## 2016-02-28 ENCOUNTER — Other Ambulatory Visit: Payer: BLUE CROSS/BLUE SHIELD

## 2016-02-28 NOTE — Procedures (Signed)
    History:  [name] is a 55 year old gentleman with a history of diabetes, with an event of hypoglycemia on 03/13/2015 associated with a seizure event. The patient has been on Keppra since that time, he is being reevaluated for a possible taper off of his seizure medication. The patient has not had any seizure recurrence.  This is a routine EEG. No skull defects are noted. Medications include aspirin, gabapentin, Keppra, Victoza, metoprolol, Crestor, Ultram, vitamin C, and Coumadin.   EEG classification: Normal awake and drowsy  Description of the recording: The background rhythms of this recording consists of a fairly well modulated medium amplitude alpha rhythm of 9 Hz that is reactive to eye opening and closure. As the record progresses, the patient appears to remain in the waking state throughout the recording. Photic stimulation was performed, resulting in a bilateral and symmetric photic driving response. Hyperventilation was also performed, resulting in a minimal buildup of the background rhythm activities without significant slowing seen. Toward the end of the recording, the patient enters the drowsy state with slight symmetric slowing seen. The patient never enters stage II sleep. At no time during the recording does there appear to be evidence of spike or spike wave discharges or evidence of focal slowing. EKG monitor shows an irregular heart rhythm with a heart rate of 78.  Impression: This is a normal EEG recording in the waking and drowsy state. No evidence of ictal or interictal discharges are seen.

## 2016-02-28 NOTE — Telephone Encounter (Signed)
I called the patient, talk with the wife. The EEG study was unremarkable, the patient will come off of the Keppra by going to 500 mg daily for 2 weeks, then stop the medication. They are to call if any concerns arise.

## 2016-02-28 NOTE — Telephone Encounter (Signed)
Pt's wife called said she was half asleep when Dr Lacretia NicksW called and wanted to know the directions for tapering pt off medication. Per Dr Anne HahnWillis note I advised her the Keppra is to take 500mg  daily for 2 weeks and then stop. She said she understood and did not have any questions.

## 2016-03-05 ENCOUNTER — Institutional Professional Consult (permissible substitution): Payer: BLUE CROSS/BLUE SHIELD | Admitting: Neurology

## 2016-03-15 ENCOUNTER — Emergency Department (HOSPITAL_COMMUNITY)
Admission: EM | Admit: 2016-03-15 | Discharge: 2016-03-15 | Disposition: A | Discharge status: Home / Self Care | Payer: BLUE CROSS/BLUE SHIELD | Attending: Emergency Medicine | Admitting: Emergency Medicine

## 2016-03-15 ENCOUNTER — Encounter (HOSPITAL_COMMUNITY): Payer: Self-pay

## 2016-03-15 DIAGNOSIS — I8391 Asymptomatic varicose veins of right lower extremity: Secondary | ICD-10-CM | POA: Diagnosis not present

## 2016-03-15 DIAGNOSIS — Y92009 Unspecified place in unspecified non-institutional (private) residence as the place of occurrence of the external cause: Secondary | ICD-10-CM | POA: Insufficient documentation

## 2016-03-15 DIAGNOSIS — Y9301 Activity, walking, marching and hiking: Secondary | ICD-10-CM | POA: Diagnosis not present

## 2016-03-15 DIAGNOSIS — Z7982 Long term (current) use of aspirin: Secondary | ICD-10-CM | POA: Insufficient documentation

## 2016-03-15 DIAGNOSIS — Z7901 Long term (current) use of anticoagulants: Secondary | ICD-10-CM | POA: Diagnosis not present

## 2016-03-15 DIAGNOSIS — W228XXA Striking against or struck by other objects, initial encounter: Secondary | ICD-10-CM | POA: Diagnosis not present

## 2016-03-15 DIAGNOSIS — Y999 Unspecified external cause status: Secondary | ICD-10-CM | POA: Diagnosis not present

## 2016-03-15 DIAGNOSIS — S81801A Unspecified open wound, right lower leg, initial encounter: Secondary | ICD-10-CM | POA: Insufficient documentation

## 2016-03-15 DIAGNOSIS — Z87891 Personal history of nicotine dependence: Secondary | ICD-10-CM | POA: Diagnosis not present

## 2016-03-15 DIAGNOSIS — I10 Essential (primary) hypertension: Secondary | ICD-10-CM | POA: Insufficient documentation

## 2016-03-15 DIAGNOSIS — I83899 Varicose veins of unspecified lower extremities with other complications: Secondary | ICD-10-CM

## 2016-03-15 LAB — I-STAT CHEM 8, ED
BUN: 20 mg/dL (ref 6–20)
CALCIUM ION: 1.12 mmol/L — AB (ref 1.15–1.40)
CHLORIDE: 108 mmol/L (ref 101–111)
CREATININE: 0.7 mg/dL (ref 0.61–1.24)
Glucose, Bld: 113 mg/dL — ABNORMAL HIGH (ref 65–99)
HCT: 32 % — ABNORMAL LOW (ref 39.0–52.0)
Hemoglobin: 10.9 g/dL — ABNORMAL LOW (ref 13.0–17.0)
Potassium: 3.3 mmol/L — ABNORMAL LOW (ref 3.5–5.1)
SODIUM: 144 mmol/L (ref 135–145)
TCO2: 26 mmol/L (ref 0–100)

## 2016-03-15 LAB — PROTIME-INR
INR: 2.31
Prothrombin Time: 25.8 seconds — ABNORMAL HIGH (ref 11.4–15.2)

## 2016-03-15 NOTE — ED Notes (Signed)
Pt stable, ambulatory, states understanding of discharge instructions 

## 2016-03-15 NOTE — Discharge Instructions (Signed)
Sutures out in 7 days. Return here for any bleeding

## 2016-03-15 NOTE — ED Triage Notes (Signed)
Pt arrived via EMS from home c/o right leg injury.  Pt states he was walking at home and hit his leg on the coffee table.

## 2016-03-15 NOTE — ED Notes (Signed)
Pt stated he wanted to leave and not be seen. I attempted to reason with him, and informed him his oxygen saturation was below (0%, which could cause him to be dizzy, nauseous, and even pass out. He acknowledged, and stated he still wanted to leave. He refused all further interventions.

## 2016-03-15 NOTE — ED Provider Notes (Signed)
[facility]EMERGENCY DEPT Provider Note   CSN: [id number] Arrival date & time: 03/15/16  4098  By signing my name below, I, Suzan Slick. Elon Spanner, attest that this documentation has been prepared under the direction and in the presence of Lorre Nick, MD.  Electronically Signed: Suzan Slick. Elon Spanner, [name]. 03/15/16. 3:50 AM.    History   Chief Complaint Chief Complaint  Patient presents with  . Leg Injury   The history is provided by the patient. No language interpreter was used.    HPI Comments: [name], brought in by EMS is a 55 y.o. male with a PMHx of hyperlipidemia and HTN who presents to the Emergency Department here for a R leg injury sustained just prior to arrival. Pt states he was walking when he struck his leg against the television stand. Pt c/o mild "numbness" to the R foot. Open wound reported to the R lower extremity. Bleeding controlled with gauze and dressing. No interventions attempted prior to arrival. No recent fever, chills, nausea, or vomiting. No focal weakness or loss of sensation. Pt is currently on Coumadin for aortic valve replacement.   PCP: Gwynneth Aliment, MD    Past Medical History:  Diagnosis Date  . Ascending aortic aneurysm and dissection    2006 repaired  7 cm aneurysm  . Chest pain, atypical   . Coronary artery reimplantation   . Hyperlipidemia    Mixed  . Hypertension    Unspecified  . Obesity   . S/P aortic valve and root replacement    HX of St. Jude  . Sleep apnea, obstructive     Patient Active Problem List   Diagnosis Date Noted  . Convulsions/seizures (HCC) 08/30/2015  . Left leg weakness   . SDH (subdural hematoma) (HCC) 03/17/2015  . Post-concussion headache 03/17/2015  . Faintness   . Encounter for therapeutic drug monitoring 07/08/2013  . Restrictive lung disease 03/21/2011  . Aortic valve disorder 07/29/2010  . S/P aortic valve replacement 07/29/2010  . Long term current use of anticoagulant 07/29/2010  .  HYPERLIPIDEMIA-MIXED 11/20/2008  . OBESITY 11/20/2008  . SLEEP APNEA, OBSTRUCTIVE 11/20/2008  . HYPERTENSION, UNSPECIFIED 11/20/2008    Past Surgical History:  Procedure Laterality Date  . Ascending aortic dissection aneurysm  12/06/2004   ok for MRI 1.5 or 3T Using a 27-mm St. Jude mechanical valve conduit with reimplantation of both coronary arteries  . CORNEAL TRANSPLANT     R   . KNEE SURGERY     R  . Replacement of aortic valve  12/06/2004       Home Medications    Prior to Admission medications   Medication Sig Start Date End Date Taking? Authorizing Provider  aspirin 81 MG tablet Take 81 mg by mouth daily.    Historical Provider, MD  gabapentin (NEURONTIN) 400 MG capsule Take 1 capsule (400 mg total) by mouth 3 (three) times daily. 03/28/15   Richarda Overlie, MD  levETIRAcetam (KEPPRA XR) 500 MG 24 hr tablet Take 2 tablets (1,000 mg total) by mouth daily. 08/30/15   York Spaniel, MD  Liraglutide (VICTOZA [place]) Inject 12 Units into the skin daily.    Historical Provider, MD  metoprolol succinate (TOPROL-XL) 50 MG 24 hr tablet Take 50 mg by mouth daily.  08/29/13   Historical Provider, MD  Multiple Vitamin (MULTIVITAMIN WITH MINERALS) TABS tablet Take 1 tablet by mouth daily.    Historical Provider, MD  rosuvastatin (CRESTOR) 10 MG tablet Take 10 mg by mouth daily.  Historical Provider, MD  traMADol (ULTRAM) 50 MG tablet Take 50 mg by mouth every 6 (six) hours as needed.    Historical Provider, MD  vitamin C (ASCORBIC ACID) 500 MG tablet Take 500 mg by mouth daily.    Historical Provider, MD  warfarin (COUMADIN) 5 MG tablet Take as Directed by [facility] 09/06/15   Tonny Bollman, MD    Family History Family History  Problem Relation Age of Onset  . Cervical cancer Mother   . Coronary artery disease Mother   . Diabetes Mother   . Diabetes Father   . Cervical cancer Sister     Social History Social History  Substance Use Topics  . Smoking status: Former Smoker      Packs/day: 0.10    Years: 18.00    Types: Cigarettes    Quit date: 12/07/1989  . Smokeless tobacco: Former Neurosurgeon  . Alcohol use No     Comment: Quit 1991     Allergies   Nsaids   Review of Systems Review of Systems  Constitutional: Negative for chills and fever.  Respiratory: Negative for shortness of breath.   Cardiovascular: Negative for chest pain.  Gastrointestinal: Negative for nausea and vomiting.  Skin: Positive for wound.  Neurological: Positive for numbness.  All other systems reviewed and are negative.    Physical Exam Updated Vital Signs BP 124/61 (BP Location: Right Arm)   Pulse 73   Temp 98.9 F (37.2 C) (Oral)   Resp 14   Ht 6\' 2"  (1.88 m)   Wt (!) 307 lb (139.3 kg)   SpO2 98%   BMI 39.42 kg/m   Physical Exam  Constitutional: He is oriented to person, place, and time. He appears well-developed and well-nourished. No distress.  HENT:  Head: Normocephalic.  Eyes: Conjunctivae are normal. Pupils are equal, round, and reactive to light. No scleral icterus.  Neck: Normal range of motion. Neck supple. No thyromegaly present.  Cardiovascular: Normal rate and regular rhythm.  Exam reveals no gallop and no friction rub.   No murmur heard. Pulmonary/Chest: Effort normal and breath sounds normal. No respiratory distress. He has no wheezes. He has no rales.  Abdominal: Soft. Bowel sounds are normal. He exhibits no distension. There is no tenderness. There is no rebound.  Musculoskeletal: Normal range of motion.  Bleeding varicose vein to R mid anterior lower extremity.  Neurological: He is alert and oriented to person, place, and time.  Neurovascularly intact at the R foot.  Skin: Skin is warm and dry. No rash noted.  Psychiatric: He has a normal mood and affect. His behavior is normal.     ED Treatments / Results   DIAGNOSTIC STUDIES: Oxygen Saturation is 98% on RA, Normal by my interpretation.    COORDINATION OF CARE: 3:39 AM- Will repair wound.  Discussed treatment plan with pt at bedside and pt agreed to plan.     Labs (all labs ordered are listed, but only abnormal results are displayed) Labs Reviewed - No data to display  EKG  EKG Interpretation None       Radiology No results found.  Procedures .Marland KitchenLaceration Repair Date/Time: 03/15/2016 3:40 AM Performed by: Lorre Nick Authorized by: Lorre Nick   Consent:    Consent obtained:  Verbal   Consent given by:  Patient   Risks discussed:  Infection   Alternatives discussed:  Delayed treatment Anesthesia (see MAR for exact dosages):    Anesthesia method:  Local infiltration   Local anesthetic:  Lidocaine  1% w/o epi Laceration details:    Location:  Leg   Leg location:  R lower leg Repair type:    Repair type:  Intermediate Pre-procedure details:    Preparation:  Patient was prepped and draped in usual sterile fashion Exploration:    Wound exploration: wound explored through full range of motion     Contaminated: no   Treatment:    Amount of cleaning:  Standard   Visualized foreign bodies/material removed: no   Skin repair:    Repair method:  Sutures   Suture size:  3-0   Suture material:  Prolene   Suture technique:  Simple interrupted   Number of sutures:  3 Approximation:    Approximation:  Close   Vermilion border: well-aligned   Post-procedure details:    Patient tolerance of procedure:  Tolerated well, no immediate complications    (including critical care time)  Medications Ordered in ED Medications - No data to display   Initial Impression / Assessment and Plan / ED Course  I have reviewed the triage vital signs and the nursing notes.  Pertinent labs & imaging results that were available during my care of the patient were reviewed by me and considered in my medical decision making (see chart for details).  Clinical Course    Wound repaired as above.  hemoglobin stable. Bleeding controlled. Return precautions given  Final  Clinical Impressions(s) / ED Diagnoses   Final diagnoses:  None    New Prescriptions New Prescriptions   No medications on file  I personally performed the services described in this documentation, which was scribed in my presence. The recorded information has been reviewed and is accurate.      Lorre NickAnthony Zohaib Heeney, MD 03/15/16 [phone]

## 2016-03-18 ENCOUNTER — Encounter: Payer: Self-pay | Admitting: Neurology

## 2016-03-18 ENCOUNTER — Ambulatory Visit (INDEPENDENT_AMBULATORY_CARE_PROVIDER_SITE_OTHER): Payer: BLUE CROSS/BLUE SHIELD | Admitting: Neurology

## 2016-03-18 VITALS — BP 120/52 | HR 66 | Resp 18 | Ht 74.0 in | Wt 308.0 lb

## 2016-03-18 DIAGNOSIS — Z952 Presence of prosthetic heart valve: Secondary | ICD-10-CM

## 2016-03-18 DIAGNOSIS — R400 Somnolence: Secondary | ICD-10-CM

## 2016-03-18 DIAGNOSIS — G4733 Obstructive sleep apnea (adult) (pediatric): Secondary | ICD-10-CM

## 2016-03-18 NOTE — Progress Notes (Signed)
Subjective:    Patient ID: [name] is a 55 y.o. male.  HPI     Huston FoleySaima Jaselyn Nahm, MD, PhD [name] [address], [address] [address] [place], [place] [address]  Dear Lance DanceKeith,   I saw your patient, Lance PointerFrederick Martos, upon your kind request in my clinic today for initial consultation of his sleep disorder, in particular his prior history of OSA. The patient is unaccompanied today. As you know, [name] is a 55 year old right-handed gentleman with an underlying medical history of isolated seizure event, subdural hematoma, aortic aneurysm, status post repair, hyperlipidemia, hypertension, aortic valve replacement, and morbid obesity, who was previously diagnosed with obstructive sleep apnea several years ago, maybe early 90s and placed on CPAP therapy. He had re-evaluation over 5 years ago, but prior sleep study results are not available for my review today. CPAP compliance download  is not available for my review today. He did not bring his CPAP machine. He reports snoring, recurrent headaches, daytime somnolence, Epworth sleepiness score is 13 out of 24 today. I reviewed your office note from 02/06/2016. He tries to be compliant with CPAP, but has bothersome daytime somnolence and residual snoring. 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  Both his children died at age 57, sadly.  He quit smoking in 91 and quit drinking alcohol in the late 80s, may have been a heavy drinker.  He quit drinking sodas some years ago. He drinks about 2 cups of coffee.  Bedtime is around 11 PM, wake time time is 4:45 AM. He denies RLS symptoms.   His Past Medical History Is Significant For: Past Medical History:  Diagnosis Date  . Ascending aortic aneurysm and dissection    2006 repaired  7 cm aneurysm  . Chest pain, atypical   . Coronary artery reimplantation   . Hyperlipidemia    Mixed  . Hypertension    Unspecified  . Obesity   . S/P aortic valve and root replacement    HX of St.  Jude  . Seizures (HCC)   . Sleep apnea, obstructive     His Past Surgical History Is Significant For: Past Surgical History:  Procedure Laterality Date  . Ascending aortic dissection aneurysm  12/06/2004   ok for MRI 1.5 or 3T Using a 27-mm St. Jude mechanical valve conduit with reimplantation of both coronary arteries  . CORNEAL TRANSPLANT     R   . KNEE SURGERY     R  . Replacement of aortic valve  12/06/2004    His Family History Is Significant For: Family History  Problem Relation Age of Onset  . Cervical cancer Mother   . Coronary artery disease Mother   . Diabetes Mother   . Diabetes Father   . Cervical cancer Sister     His Social History Is Significant For: Social History   Social History  . Marital status: Married    Spouse name: N/A  . Number of children: 0  . Years of education: 12   Occupational History  . 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 Smoking status: Former Smoker    Packs/day: 0.10    Years: 18.00    Types: Cigarettes    Quit date: 12/07/1989  . Smokeless tobacco: Former NeurosurgeonUser  . Alcohol use No     Comment: Quit 1991  . Drug use: No  . Sexual activity: Not Asked   Other  Topics Concern  . None   Social History Narrative   Married   No regular exercise      Patient drinks 2 cups of coffee a day    Patient is right handed.     His Allergies Are:  Allergies  Allergen Reactions  . Nsaids Other (See Comments)    Told not to take meds-per patient  :   His Current Medications Are:  Outpatient Encounter Prescriptions as of 03/18/2016  Medication Sig  . aspirin 81 MG tablet Take 81 mg by mouth daily.  . Cholecalciferol (VITAMIN D) 2000 units tablet Take 2,000 Units by mouth 2 (two) times daily.  Marland Kitchen gabapentin (NEURONTIN) 400 MG capsule Take 1 capsule (400 mg total) by mouth 3 (three) times daily.  . metoprolol succinate (TOPROL-XL) 50 MG 24 hr tablet Take 50 mg by  mouth daily.   . Multiple Vitamin (MULTIVITAMIN WITH MINERALS) TABS tablet Take 1 tablet by mouth daily.  . rosuvastatin (CRESTOR) 10 MG tablet Take 10 mg by mouth daily.   . traMADol (ULTRAM) 50 MG tablet Take 50 mg by mouth every 6 (six) hours as needed.  . vitamin C (ASCORBIC ACID) 500 MG tablet Take 500 mg by mouth daily.  Marland Kitchen warfarin (COUMADIN) 5 MG tablet Take as Directed by [facility] (Patient taking differently: Take 5-7.5 mg by mouth See admin instructions. Take 5 mg on Friday then take 7.5 mg all the other days)  . [DISCONTINUED] levETIRAcetam (KEPPRA XR) 500 MG 24 hr tablet Take 2 tablets (1,000 mg total) by mouth daily.   No facility-administered encounter medications on file as of 03/18/2016.   :  Review of Systems:  Out of a complete 14 point review of systems, all are reviewed and negative with the exception of these symptoms as listed below: Review of Systems  Neurological:       Patient states that he had a sleep study about 5 years ago. He was prescribed CPAP and is still using. He did not bring CPAP today.  Snoring, witnessed apnea, sometimes feels tired when he wakes up, daytime fatigue   Epworth Sleepiness Scale 0= would never doze 1= slight chance of dozing 2= moderate chance of dozing 3= high chance of dozing  Sitting and reading:3 Watching TV:2 Sitting inactive in a public place (ex. Theater or meeting):2 As a passenger in a car for an hour without a break:1 Lying down to rest in the afternoon:3 Sitting and talking to someone:1 Sitting quietly after lunch (no alcohol):1 In a car, while stopped in traffic:0 Total:13  Objective:  Neurologic Exam  Physical Exam Physical Examination:   Vitals:   03/18/16 1606  BP: (!) 120/52  Pulse: 66  Resp: 18   General Examination: The patient is a very pleasant 55 y.o. male in no acute distress. He appears well-developed and well-nourished and well groomed.   HEENT: Normocephalic, atraumatic, pupils are  unequal, round and reactive to light and accommodation on L, not reactive and irregular on R and s/p corneal tx. Eyes are dysconjugate. Funduscopic exam is normal with sharp disc margins noted on L, minimal vision on R. Extraocular tracking is good without limitation to gaze excursion or nystagmus noted. Normal smooth pursuit is noted. Hearing is grossly intact. Face is symmetric with normal facial animation and normal facial sensation. Speech is clear with no dysarthria noted. There is no hypophonia. There is no lip, neck/head, jaw or voice tremor. Neck is supple with full range of passive and active motion. There  are no carotid bruits on auscultation. Oropharynx exam reveals: mild mouth dryness, adequate dental hygiene and moderate airway crowding, due to smaller airway entry, larger uvula and tonsils in place, about 1+. Mallampati is class III. Tongue protrudes centrally and palate elevates symmetrically. Neck size is 19.25 inches. He has a Moderate overbite.   Chest: Clear to auscultation without wheezing, rhonchi or crackles noted.  Heart: S1+S2+0, regular and normal without murmurs, rubs or gallops noted.   Abdomen: Soft, non-tender and non-distended with normal bowel sounds appreciated on auscultation.  Extremities: There is 2+ pitting edema in the distal lower extremities bilaterally. Pedal pulses are intact.  Skin: Warm but chronic discoloration and trophic changes noted in both distal legs.  Musculoskeletal: exam reveals no obvious joint deformities, tenderness or joint swelling or erythema.   Neurologically:  Mental status: The patient is awake, alert and oriented in all 4 spheres. His immediate and remote memory, attention, language skills and fund of knowledge are appropriate. There is no evidence of aphasia, agnosia, apraxia or anomia. Speech is clear with normal prosody and enunciation. Thought process is linear. Mood is normal and affect is normal.  Cranial nerves II - XII are as  described above under HEENT exam. In addition: shoulder shrug is normal with equal shoulder height noted. Motor exam: Normal bulk, strength and tone is noted. There is no drift, tremor or rebound. Romberg is negative. Reflexes are 1+ throughout. Babinski: Toes are flexor bilaterally. Fine motor skills and coordination: intact with normal finger taps, normal hand movements, normal rapid alternating patting, normal foot taps and normal foot agility.  Cerebellar testing: No dysmetria or intention tremor on finger to nose testing. Heel to shin is unremarkable bilaterally. There is no truncal or gait ataxia.  Sensory exam: intact to light touch, pinprick, vibration, temperature sense in the upper and lower extremities.  Gait, station and balance: He stands easily. No veering to one side is noted. No leaning to one side is noted. Posture is age-appropriate and stance is narrow based. Gait shows normal stride length and normal pace. No problems turning are noted. Tandem walk is challenging for him.                Assessment and Plan:  In summary, JAIR LINDBLAD is a very pleasant 55 y.o.-year old male with an underlying medical history of isolated seizure event, subdural hematoma, aortic aneurysm, status post repair, hyperlipidemia, hypertension, aortic valve replacement, and morbid obesity, whose history and physical exam are in keeping with obstructive sleep apnea (OSA). He has been on CPAP for years, but has not had re-evaluation in over 5 years. He will need re-evaluation to optimize treatment and update his machine, which is over 72 years old.  I had a long chat with the patient about my findings and the diagnosis of OSA, its prognosis and treatment options. We talked about medical treatments, surgical interventions and non-pharmacological approaches. I explained in particular the risks and ramifications of untreated moderate to severe OSA, especially with respect to developing cardiovascular disease down  the Road, including congestive heart failure, difficult to treat hypertension, cardiac arrhythmias, or stroke. Even type 2 diabetes has, in part, been linked to untreated OSA. Symptoms of untreated OSA include daytime sleepiness, memory problems, mood irritability and mood disorder such as depression and anxiety, lack of energy, as well as recurrent headaches, especially morning headaches. We talked about a healthy lifestyle in general, as well as the importance of weight control. I encouraged the patient to eat  healthy, exercise daily and keep well hydrated, to keep a scheduled bedtime and wake time routine, to not skip any meals and eat healthy snacks in between meals. I advised the patient not to drive when feeling sleepy. He is advised to try to make more time for sleep.  I recommended the following at this time: sleep study with potential positive airway pressure titration. (We will score hypopneas at 4% and split the sleep study into diagnostic and treatment portion, if the estimated. 2 hour AHI is >15/h).   I explained the sleep test procedure to the patient and also outlined possible surgical and non-surgical treatment options of OSA, including the use of a custom-made dental device (which would require a referral to a specialist dentist or oral surgeon), upper airway surgical options, such as pillar implants, radiofrequency surgery, tongue base surgery, and UPPP (which would involve a referral to an ENT surgeon). Rarely, jaw surgery such as mandibular advancement may be considered.  I also explained the CPAP treatment option to the patient, who indicated that he would be willing to continue to use CPAP if the need arises. I explained the importance of being compliant with PAP treatment, not only for insurance purposes but primarily to improve His symptoms, and for the patient's long term health benefit, including to reduce His cardiovascular risks. I answered all his questions today and the patient  was in agreement. I would like to see her back after the sleep study is completed and encouraged him to call with any interim questions, concerns, problems or updates.   Thank you very much for allowing me to participate in the care of this nice patient. If I can be of any further assistance to you please do not hesitate to talk to me.  Sincerely,   Huston Foley, MD, PhD

## 2016-03-18 NOTE — Patient Instructions (Signed)
Based on your symptoms and your exam I believe you till are at risk for obstructive sleep apnea or OSA, and I think we should proceed with a sleep study to determine how severe it is. If you have more than mild OSA, I want you to consider treatment with CPAP. Please remember, the risks and ramifications of moderate to severe obstructive sleep apnea or OSA are: Cardiovascular disease, including congestive heart failure, stroke, difficult to control hypertension, arrhythmias, and even type 2 diabetes has been linked to untreated OSA. Sleep apnea causes disruption of sleep and sleep deprivation in most cases, which, in turn, can cause recurrent headaches, problems with memory, mood, concentration, focus, and vigilance. Most people with untreated sleep apnea report excessive daytime sleepiness, which can affect their ability to drive. Please do not drive if you feel sleepy.   I will likely see you back after your sleep study to go over the test results and where to go from there. We will call you after your sleep study to advise about the results (most likely, you will hear from Lafonda Mossesiana, my nurse) and to set up an appointment at the time, as necessary.    Our sleep lab administrative assistant, Alvis LemmingsDawn will meet with you or call you to schedule your sleep study. If you don't hear back from her by next week please feel free to call her at [phone]. This is her direct line and please leave a message with your phone number to call back if you get the voicemail box. She will call back as soon as possible.

## 2016-03-19 ENCOUNTER — Other Ambulatory Visit: Payer: Self-pay | Admitting: Cardiovascular Disease

## 2016-03-19 DIAGNOSIS — Z952 Presence of prosthetic heart valve: Secondary | ICD-10-CM

## 2016-03-19 DIAGNOSIS — Z5181 Encounter for therapeutic drug level monitoring: Secondary | ICD-10-CM

## 2016-03-19 DIAGNOSIS — Z7901 Long term (current) use of anticoagulants: Secondary | ICD-10-CM

## 2016-03-19 DIAGNOSIS — I359 Nonrheumatic aortic valve disorder, unspecified: Secondary | ICD-10-CM

## 2016-03-20 ENCOUNTER — Ambulatory Visit (INDEPENDENT_AMBULATORY_CARE_PROVIDER_SITE_OTHER): Payer: BLUE CROSS/BLUE SHIELD | Admitting: Pharmacist

## 2016-03-20 DIAGNOSIS — Z7901 Long term (current) use of anticoagulants: Secondary | ICD-10-CM

## 2016-03-20 DIAGNOSIS — Z5181 Encounter for therapeutic drug level monitoring: Secondary | ICD-10-CM

## 2016-03-20 DIAGNOSIS — I359 Nonrheumatic aortic valve disorder, unspecified: Secondary | ICD-10-CM

## 2016-03-20 DIAGNOSIS — Z952 Presence of prosthetic heart valve: Secondary | ICD-10-CM | POA: Diagnosis not present

## 2016-03-20 LAB — POCT INR: INR: 2.3

## 2016-04-24 ENCOUNTER — Encounter (INDEPENDENT_AMBULATORY_CARE_PROVIDER_SITE_OTHER): Payer: Self-pay

## 2016-04-24 ENCOUNTER — Ambulatory Visit (INDEPENDENT_AMBULATORY_CARE_PROVIDER_SITE_OTHER): Payer: BLUE CROSS/BLUE SHIELD

## 2016-04-24 DIAGNOSIS — Z7901 Long term (current) use of anticoagulants: Secondary | ICD-10-CM | POA: Diagnosis not present

## 2016-04-24 DIAGNOSIS — I359 Nonrheumatic aortic valve disorder, unspecified: Secondary | ICD-10-CM | POA: Diagnosis not present

## 2016-04-24 DIAGNOSIS — Z952 Presence of prosthetic heart valve: Secondary | ICD-10-CM

## 2016-04-24 DIAGNOSIS — Z5181 Encounter for therapeutic drug level monitoring: Secondary | ICD-10-CM

## 2016-04-24 LAB — POCT INR: INR: 2.4

## 2016-05-14 ENCOUNTER — Encounter (HOSPITAL_COMMUNITY): Payer: Self-pay

## 2016-05-14 ENCOUNTER — Emergency Department (HOSPITAL_COMMUNITY)
Admission: EM | Admit: 2016-05-14 | Discharge: 2016-05-14 | Disposition: A | Discharge status: Home / Self Care | Payer: BLUE CROSS/BLUE SHIELD | Attending: Emergency Medicine | Admitting: Emergency Medicine

## 2016-05-14 DIAGNOSIS — Z79899 Other long term (current) drug therapy: Secondary | ICD-10-CM | POA: Diagnosis not present

## 2016-05-14 DIAGNOSIS — Z87891 Personal history of nicotine dependence: Secondary | ICD-10-CM | POA: Diagnosis not present

## 2016-05-14 DIAGNOSIS — Z7982 Long term (current) use of aspirin: Secondary | ICD-10-CM | POA: Diagnosis not present

## 2016-05-14 DIAGNOSIS — I10 Essential (primary) hypertension: Secondary | ICD-10-CM | POA: Diagnosis not present

## 2016-05-14 DIAGNOSIS — Z952 Presence of prosthetic heart valve: Secondary | ICD-10-CM | POA: Diagnosis not present

## 2016-05-14 DIAGNOSIS — I8392 Asymptomatic varicose veins of left lower extremity: Secondary | ICD-10-CM | POA: Insufficient documentation

## 2016-05-14 DIAGNOSIS — Z7901 Long term (current) use of anticoagulants: Secondary | ICD-10-CM | POA: Diagnosis not present

## 2016-05-14 DIAGNOSIS — I83899 Varicose veins of unspecified lower extremities with other complications: Secondary | ICD-10-CM

## 2016-05-14 LAB — PROTIME-INR
INR: 1.85
Prothrombin Time: 21.6 seconds — ABNORMAL HIGH (ref 11.4–15.2)

## 2016-05-14 LAB — HEMOGLOBIN AND HEMATOCRIT, BLOOD
HEMATOCRIT: 34 % — AB (ref 39.0–52.0)
HEMOGLOBIN: 10.2 g/dL — AB (ref 13.0–17.0)

## 2016-05-14 NOTE — ED Provider Notes (Signed)
[facility]-EMERGENCY DEPT Provider Note   CSN: [id number] Arrival date & time: 05/14/16  1704  By signing my name below, I, [name], attest that this documentation has been prepared under the direction and in the presence of [name] Emi Lymon, NP. Electronically Signed: Angelene GiovanniEmmanuella [name], [name]. 05/14/16. 7:37 PM.   History   Chief Complaint Chief Complaint  Patient presents with  . abrasion/laceration    HPI Comments: [name] is a 55 y.o. male with a hx of hypertension who presents to the Emergency Department complaining of a small laceration to the anterior aspect of his right ankle s/p injury that occurred PTA. He explains that he struck his ankle on something at home which lacerated his ankle. He denies any falls, head injuries, or any LOC. Bleeding is currently controlled with applied pressure and wrapping his ankle in a trash bag. No oral medications tried for these symptoms. Pt is currently on Coumadin and wants to be evaluated to see if he needs sutures. He has an allergy to NSAIDs. He denies any fever, chills, vomiting, numbness/tingling, weakness, rectal bleeding, blood in stool, or any other symptoms.   PCP: Dr. [name].   The history is provided by the patient. No language interpreter was used.    Past Medical History:  Diagnosis Date  . Ascending aortic aneurysm and dissection    2006 repaired  7 cm aneurysm  . Chest pain, atypical   . Coronary artery reimplantation   . Hyperlipidemia    Mixed  . Hypertension    Unspecified  . Obesity   . S/P aortic valve and root replacement    HX of St. Jude  . Seizures (HCC)   . Sleep apnea, obstructive     Patient Active Problem List   Diagnosis Date Noted  . Convulsions/seizures (HCC) 08/30/2015  . Left leg weakness   . SDH (subdural hematoma) (HCC) 03/17/2015  . Post-concussion headache 03/17/2015  . Faintness   . Encounter for therapeutic drug monitoring 07/08/2013  . Restrictive lung disease  03/21/2011  . Aortic valve disorder 07/29/2010  . S/P aortic valve replacement 07/29/2010  . Long term current use of anticoagulant 07/29/2010  . HYPERLIPIDEMIA-MIXED 11/20/2008  . OBESITY 11/20/2008  . SLEEP APNEA, OBSTRUCTIVE 11/20/2008  . HYPERTENSION, UNSPECIFIED 11/20/2008    Past Surgical History:  Procedure Laterality Date  . Ascending aortic dissection aneurysm  12/06/2004   ok for MRI 1.5 or 3T Using a 27-mm St. Jude mechanical valve conduit with reimplantation of both coronary arteries  . CORNEAL TRANSPLANT     R   . KNEE SURGERY     R  . Replacement of aortic valve  12/06/2004       Home Medications    Prior to Admission medications   Medication Sig Start Date End Date Taking? Authorizing Provider  aspirin 81 MG tablet Take 81 mg by mouth daily.    Historical Provider, MD  Cholecalciferol (VITAMIN D) 2000 units tablet Take 2,000 Units by mouth 2 (two) times daily.    Historical Provider, MD  gabapentin (NEURONTIN) 400 MG capsule Take 1 capsule (400 mg total) by mouth 3 (three) times daily. 03/28/15   Richarda OverlieNayana Abrol, MD  metoprolol succinate (TOPROL-XL) 50 MG 24 hr tablet Take 50 mg by mouth daily.  08/29/13   Historical Provider, MD  Multiple Vitamin (MULTIVITAMIN WITH MINERALS) TABS tablet Take 1 tablet by mouth daily.    Historical Provider, MD  rosuvastatin (CRESTOR) 10 MG tablet Take 10 mg by mouth daily.  Historical Provider, MD  traMADol (ULTRAM) 50 MG tablet Take 50 mg by mouth every 6 (six) hours as needed.    Historical Provider, MD  vitamin C (ASCORBIC ACID) 500 MG tablet Take 500 mg by mouth daily.    Historical Provider, MD  warfarin (COUMADIN) 5 MG tablet Take as Directed by [facility] 03/19/16   Tonny BollmanMichael Cooper, MD    Family History Family History  Problem Relation Age of Onset  . Cervical cancer Mother   . Coronary artery disease Mother   . Diabetes Mother   . Diabetes Father   . Cervical cancer Sister     Social History Social History    Substance Use Topics  . Smoking status: Former Smoker    Packs/day: 0.10    Years: 18.00    Types: Cigarettes    Quit date: 12/07/1989  . Smokeless tobacco: Former NeurosurgeonUser  . Alcohol use No     Comment: Quit 1991     Allergies   Nsaids   Review of Systems Review of Systems  Constitutional: Negative for chills and fever.  Gastrointestinal: Negative for anal bleeding, blood in stool and vomiting.  Skin: Positive for wound.  Neurological: Negative for weakness and numbness.     Physical Exam Updated Vital Signs BP 129/82 (BP Location: Left Arm)   Pulse 90   Temp 98.3 F (36.8 C)   Resp 18   SpO2 97%   Physical Exam  Constitutional: He is oriented to person, place, and time. He appears well-developed and well-nourished.  HENT:  Head: Normocephalic and atraumatic.  Cardiovascular: Normal rate.   Pulmonary/Chest: Effort normal.  Musculoskeletal:  0.5 cm wound to the anterior aspect of the RLE, the area that was bleeding has clotted over and there is no active bleeding.   Neurological: He is alert and oriented to person, place, and time.  Skin: Skin is warm and dry.  Psychiatric: He has a normal mood and affect.  Nursing note and vitals reviewed.    ED Treatments / Results  DIAGNOSTIC STUDIES: Oxygen Saturation is 97% on RA, adequate by my interpretation.    COORDINATION OF CARE: 7:31 PM- Pt advised of plan for treatment and pt agrees. Pt informed of his hemoglobin level and prothrombin time; advised him to follow up with his PCP and inform his PCP of the steady decline.   Labs (all labs ordered are listed, but only abnormal results are displayed) Labs Reviewed  PROTIME-INR - Abnormal; Notable for the following:       Result Value   Prothrombin Time 21.6 (*)    All other components within normal limits  HEMOGLOBIN AND HEMATOCRIT, BLOOD - Abnormal; Notable for the following:    Hemoglobin 10.2 (*)    HCT 34.0 (*)    All other components within normal limits      Radiology No results found.  Procedures Procedures (including critical care time)  Medications Ordered in ED Medications - No data to display   Initial Impression / Assessment and Plan / ED Course  [name] Daylyn Azbill, NP has reviewed the triage vital signs and the nursing notes.  Pertinent lab results that were available during my care of the patient were reviewed by me and considered in my medical decision making (see chart for details).  Clinical Course   55 y.o. male with bleeding to the right lower leg after he hit it prior to arrival. Patient stable for d/c without bleeding and without complaints at this time. Patient to f/u with  his PCP. He will return here if bleeding starts again and he can not get it to stop.  Final Clinical Impressions(s) / ED Diagnoses   Final diagnoses:  Bleeding from varicose vein    New Prescriptions Discharge Medication List as of 05/14/2016  7:37 PM     I personally performed the services described in this documentation, which was scribed in my presence. The recorded information has been reviewed and is accurate.    [place], NP 05/15/16 0135    Nira Conn, MD 05/15/16 1128

## 2016-05-14 NOTE — ED Triage Notes (Signed)
Patient here with small laceration/abrasion to right ankle. States he wants checked because he is on coumadin and had level checked 2 weeks ago that was normal. Dressing not arrived at triage

## 2016-05-14 NOTE — Discharge Instructions (Signed)
Results for orders placed or performed during the hospital encounter of 05/14/16 (from the past 24 hour(s))  Protime-INR     Status: Abnormal   Collection Time: 05/14/16  5:50 PM  Result Value Ref Range   Prothrombin Time 21.6 (H) 11.4 - 15.2 seconds   INR 1.85   Hemoglobin and hematocrit, blood     Status: Abnormal   Collection Time: 05/14/16  5:50 PM  Result Value Ref Range   Hemoglobin 10.2 (L) 13.0 - 17.0 g/dL   HCT 16.134.0 (L) 09.639.0 - 04.552.0 %

## 2016-06-05 ENCOUNTER — Ambulatory Visit (INDEPENDENT_AMBULATORY_CARE_PROVIDER_SITE_OTHER): Payer: BLUE CROSS/BLUE SHIELD | Admitting: *Deleted

## 2016-06-05 DIAGNOSIS — Z5181 Encounter for therapeutic drug level monitoring: Secondary | ICD-10-CM

## 2016-06-05 DIAGNOSIS — I359 Nonrheumatic aortic valve disorder, unspecified: Secondary | ICD-10-CM

## 2016-06-05 DIAGNOSIS — Z952 Presence of prosthetic heart valve: Secondary | ICD-10-CM | POA: Diagnosis not present

## 2016-06-05 DIAGNOSIS — Z7901 Long term (current) use of anticoagulants: Secondary | ICD-10-CM

## 2016-06-05 LAB — POCT INR: INR: 2.2

## 2016-07-17 ENCOUNTER — Ambulatory Visit (INDEPENDENT_AMBULATORY_CARE_PROVIDER_SITE_OTHER): Payer: Managed Care, Other (non HMO) | Admitting: *Deleted

## 2016-07-17 DIAGNOSIS — Z7901 Long term (current) use of anticoagulants: Secondary | ICD-10-CM

## 2016-07-17 DIAGNOSIS — Z5181 Encounter for therapeutic drug level monitoring: Secondary | ICD-10-CM

## 2016-07-17 DIAGNOSIS — I359 Nonrheumatic aortic valve disorder, unspecified: Secondary | ICD-10-CM

## 2016-07-17 DIAGNOSIS — Z952 Presence of prosthetic heart valve: Secondary | ICD-10-CM | POA: Diagnosis not present

## 2016-07-17 LAB — POCT INR: INR: 2.1

## 2016-07-26 ENCOUNTER — Encounter (HOSPITAL_COMMUNITY): Payer: Self-pay | Admitting: Emergency Medicine

## 2016-07-26 ENCOUNTER — Emergency Department (HOSPITAL_COMMUNITY)
Admission: EM | Admit: 2016-07-26 | Discharge: 2016-07-26 | Disposition: A | Discharge status: Home / Self Care | Payer: Managed Care, Other (non HMO) | Attending: Emergency Medicine | Admitting: Emergency Medicine

## 2016-07-26 DIAGNOSIS — Z7982 Long term (current) use of aspirin: Secondary | ICD-10-CM | POA: Diagnosis not present

## 2016-07-26 DIAGNOSIS — Z7901 Long term (current) use of anticoagulants: Secondary | ICD-10-CM | POA: Diagnosis not present

## 2016-07-26 DIAGNOSIS — J111 Influenza due to unidentified influenza virus with other respiratory manifestations: Secondary | ICD-10-CM | POA: Diagnosis not present

## 2016-07-26 DIAGNOSIS — R69 Illness, unspecified: Secondary | ICD-10-CM

## 2016-07-26 DIAGNOSIS — R112 Nausea with vomiting, unspecified: Secondary | ICD-10-CM

## 2016-07-26 DIAGNOSIS — I10 Essential (primary) hypertension: Secondary | ICD-10-CM | POA: Insufficient documentation

## 2016-07-26 DIAGNOSIS — R197 Diarrhea, unspecified: Secondary | ICD-10-CM | POA: Diagnosis not present

## 2016-07-26 DIAGNOSIS — Z87891 Personal history of nicotine dependence: Secondary | ICD-10-CM | POA: Insufficient documentation

## 2016-07-26 DIAGNOSIS — R509 Fever, unspecified: Secondary | ICD-10-CM | POA: Diagnosis present

## 2016-07-26 LAB — COMPREHENSIVE METABOLIC PANEL
ALT: 27 U/L (ref 17–63)
AST: 20 U/L (ref 15–41)
Albumin: 3.6 g/dL (ref 3.5–5.0)
Alkaline Phosphatase: 84 U/L (ref 38–126)
Anion gap: 9 (ref 5–15)
BUN: 17 mg/dL (ref 6–20)
CHLORIDE: 106 mmol/L (ref 101–111)
CO2: 28 mmol/L (ref 22–32)
Calcium: 9.1 mg/dL (ref 8.9–10.3)
Creatinine, Ser: 0.81 mg/dL (ref 0.61–1.24)
GFR calc Af Amer: >60 mL/min (ref >60)
GLUCOSE: 123 mg/dL — AB (ref 65–99)
POTASSIUM: 3.5 mmol/L (ref 3.5–5.1)
SODIUM: 143 mmol/L (ref 135–145)
Total Bilirubin: 0.7 mg/dL (ref 0.3–1.2)
Total Protein: 6.9 g/dL (ref 6.5–8.1)

## 2016-07-26 LAB — LIPASE, BLOOD: LIPASE: 21 U/L (ref 11–51)

## 2016-07-26 LAB — URINALYSIS, ROUTINE W REFLEX MICROSCOPIC
BILIRUBIN URINE: NEGATIVE
Glucose, UA: NEGATIVE mg/dL
HGB URINE DIPSTICK: NEGATIVE
Ketones, ur: NEGATIVE mg/dL
LEUKOCYTES UA: NEGATIVE
Nitrite: NEGATIVE
PROTEIN: 30 mg/dL — AB
Specific Gravity, Urine: 1.027 (ref 1.005–1.030)
pH: 5 (ref 5.0–8.0)

## 2016-07-26 LAB — CBC
HEMATOCRIT: 40.1 % (ref 39.0–52.0)
Hemoglobin: 12.5 g/dL — ABNORMAL LOW (ref 13.0–17.0)
MCH: 26.5 pg (ref 26.0–34.0)
MCHC: 31.2 g/dL (ref 30.0–36.0)
MCV: 85 fL (ref 78.0–100.0)
Platelets: 173 10*3/uL (ref 150–400)
RBC: 4.72 MIL/uL (ref 4.22–5.81)
RDW: 19.8 % — AB (ref 11.5–15.5)
WBC: 5.4 10*3/uL (ref 4.0–10.5)

## 2016-07-26 LAB — I-STAT TROPONIN, ED: TROPONIN I, POC: 0 ng/mL (ref 0.00–0.08)

## 2016-07-26 MED ORDER — ONDANSETRON 4 MG PO TBDP
8.0000 mg | ORAL_TABLET | Freq: Once | ORAL | Status: AC
  Administered 2016-07-26: 8 mg via ORAL
  Filled 2016-07-26: qty 2

## 2016-07-26 MED ORDER — ONDANSETRON 4 MG PO TBDP: 4.0000 mg | ORAL_TABLET | Freq: Three times a day (TID) | ORAL | 0 refills | Status: DC | PRN

## 2016-07-26 MED ORDER — ACETAMINOPHEN 500 MG PO TABS
1000.0000 mg | ORAL_TABLET | Freq: Once | ORAL | Status: AC
  Administered 2016-07-26: 1000 mg via ORAL
  Filled 2016-07-26: qty 2

## 2016-07-26 NOTE — ED Triage Notes (Signed)
Pt reports flu like symptoms- non-productive cough, congestion, generalized body aches, and fever (TMax 100.0) since Thursday.  States he has vomited x 2 today and now has generalized abd pain and "a little pain in the center of chest".

## 2016-07-26 NOTE — ED Provider Notes (Signed)
TIME SEEN: 4:15 AM  CHIEF COMPLAINT: Flulike symptoms  HPI: Pt is a 56 y.o. male with history of ascending aortic aneurysm and dissection that was repaired 2006, hypertension, hyperlipidemia, aortic valve replacement on Coumadin who presents the emergency department with flulike symptoms. Patient states that he has had fevers, chills, cough and nasal congestion that started Thursday morning. Did have 2 episodes of vomiting last night. Began having diarrhea while in the waiting room. Triage note states patient had chest pain but he did denies this. States he did have a little bit of epigastric pain after vomiting but this has resolved. Adamantly denies chest pain or shortness of breath. No abdominal pain currently. No headache, neck pain or neck stiffness. Last INR was 2.1 on 07/17/16.  ROS: See HPI Constitutional:  fever  Eyes: no drainage  ENT: Nasal congestion and intermittent runny nose   Cardiovascular:  no chest pain  Resp: no SOB  GI: Diarrhea and vomiting GU: no dysuria Integumentary: no rash  Allergy: no hives  Musculoskeletal: no leg swelling  Neurological: no slurred speech ROS otherwise negative  PAST MEDICAL HISTORY/PAST SURGICAL HISTORY:  Past Medical History:  Diagnosis Date  . Ascending aortic aneurysm and dissection    2006 repaired  7 cm aneurysm  . Chest pain, atypical   . Coronary artery reimplantation   . Hyperlipidemia    Mixed  . Hypertension    Unspecified  . Obesity   . S/P aortic valve and root replacement    HX of St. Jude  . Seizures (HCC)   . Sleep apnea, obstructive     MEDICATIONS:  Prior to Admission medications   Medication Sig Start Date End Date Taking? Authorizing Provider  aspirin 81 MG tablet Take 81 mg by mouth daily.    Historical Provider, MD  Cholecalciferol (VITAMIN D) 2000 units tablet Take 2,000 Units by mouth 2 (two) times daily.    Historical Provider, MD  metoprolol succinate (TOPROL-XL) 50 MG 24 hr tablet Take 50 mg by mouth  daily.  08/29/13   Historical Provider, MD  Multiple Vitamin (MULTIVITAMIN WITH MINERALS) TABS tablet Take 1 tablet by mouth daily.    Historical Provider, MD  pregabalin (LYRICA) 75 MG capsule Take 75 mg by mouth 2 (two) times daily.    Historical Provider, MD  rosuvastatin (CRESTOR) 10 MG tablet Take 10 mg by mouth daily.     Historical Provider, MD  traMADol (ULTRAM) 50 MG tablet Take 50 mg by mouth every 6 (six) hours as needed.    Historical Provider, MD  vitamin C (ASCORBIC ACID) 500 MG tablet Take 500 mg by mouth daily.    Historical Provider, MD  warfarin (COUMADIN) 5 MG tablet Take as Directed by [facility] 03/19/16   Tonny BollmanMichael Cooper, MD    ALLERGIES:  Allergies  Allergen Reactions  . Nsaids Other (See Comments)    Told not to take meds-per patient    SOCIAL HISTORY:  Social History  Substance Use Topics  . Smoking status: Former Smoker    Packs/day: 0.10    Years: 18.00    Types: Cigarettes    Quit date: 12/07/1989  . Smokeless tobacco: Former NeurosurgeonUser  . Alcohol use No     Comment: Quit 1991    FAMILY HISTORY: Family History  Problem Relation Age of Onset  . Cervical cancer Mother   . Coronary artery disease Mother   . Diabetes Mother   . Diabetes Father   . Cervical cancer Sister  EXAM: BP 142/64 (BP Location: Left Arm)   Pulse (!) 56   Temp 98.2 F (36.8 C) (Oral)   Resp 19   SpO2 95%  CONSTITUTIONAL: Alert and oriented and responds appropriately to questions. Well-appearing; well-nourished, Afebrile and nontoxic HEAD: Normocephalic EYES: Conjunctivae clear, PERRL, EOMI ENT: normal nose; no rhinorrhea; moist mucous membranes NECK: Supple, no meningismus, no nuchal rigidity, no LAD  CARD: RRR; S1 and S2 appreciated; Systolic murmur, no clicks, no rubs, no gallops RESP: Normal chest excursion without splinting or tachypnea; breath sounds clear and equal bilaterally; no wheezes, no rhonchi, no rales, no hypoxia or respiratory distress, speaking full  sentences ABD/GI: Normal bowel sounds; non-distended; soft, non-tender, no rebound, no guarding, no peritoneal signs, no hepatosplenomegaly BACK:  The back appears normal and is non-tender to palpation, there is no CVA tenderness EXT: Normal ROM in all joints; non-tender to palpation; no edema; normal capillary refill; no cyanosis, no calf tenderness or swelling    SKIN: Normal color for age and race; warm; no rash NEURO: Moves all extremities equally PSYCH: The patient's mood and manner are appropriate. Grooming and personal hygiene are appropriate.  MEDICAL DECISION MAKING: Patient here with flulike symptoms. He is within window of Tamiflu and have discussed risk and benefits of this medication and he has declined at this time. Labs ordered in triage of been unremarkable. His lungs are clear to auscultation and he has no hypoxia or respiratory distress. Low suspicion for pneumonia. Discussed with him that we do not do routine testing from the emergency department for influenza. He looks very well-appearing on exam, nontoxic and well-hydrated. I feel he is safe to go home. Recommended taking Tylenol at home for fever and pain, Imodium as needed for diarrhea and will discharge with Zofran to take as needed for nausea. He has been able to drink here without difficulty. Abdominal exam is completely benign. Doubt meningitis, pneumonia. Doubt appendicitis, colitis, diverticulitis, bowel obstruction, cholecystitis. I do not feel he needs emergent imaging of his abdomen at this time. Discussed with him that he may also have a viral gastroenteritis. He is comfortable with plan for discharge home. Has a PCP for follow-up.  At this time, I do not feel there is any life-threatening condition present. I have reviewed and discussed all results (EKG, imaging, lab, urine as appropriate) and exam findings with patient/family. I have reviewed nursing notes and appropriate previous records.  I feel the patient is safe to  be discharged home without further emergent workup and can continue workup as an outpatient as needed. Discussed usual and customary return precautions. Patient/family verbalize understanding and are comfortable with this plan.  Outpatient follow-up has been provided. All questions have been answered.     EKG Interpretation  Date/Time:  Saturday July 26 2016 00:47:55 EST Ventricular Rate:  61 PR Interval:  242 QRS Duration: 98 QT Interval:  418 QTC Calculation: 420 R Axis:   7 Text Interpretation:  Sinus rhythm with 1st degree A-V block Possible Inferior infarct , age undetermined Anterior infarct , age undetermined Abnormal ECG Confirmed by Judith Campillo,  DO, Giannamarie Paulus [id number]) on 07/26/2016 4:15:08 AM          Layla Maw Tatiyana Foucher, DO 07/26/16 6045

## 2016-07-26 NOTE — Discharge Instructions (Signed)
You may take Tylenol 1000 g every 6 hours as needed for fever and pain. Please rest and drink clear fluids. You may use over-the-counter Imodium as needed for diarrhea.

## 2016-07-26 NOTE — ED Notes (Signed)
Patient transported to X-ray 

## 2016-07-26 NOTE — ED Notes (Signed)
Pt given gingerale for fluid challenge. Pt tolerating well. Will continue to monitor.   

## 2016-07-28 ENCOUNTER — Encounter: Payer: Self-pay | Admitting: Cardiovascular Disease

## 2016-08-07 ENCOUNTER — Telehealth: Payer: Self-pay | Admitting: Cardiovascular Disease

## 2016-08-07 NOTE — Telephone Encounter (Signed)
Will forward to Dr Excell Seltzerooper to contact dentist.  Pt last seen 1/502017 and has a pending appointment with Dr Excell Seltzerooper 09/26/2016.

## 2016-08-07 NOTE — Telephone Encounter (Signed)
Hayes LudwigSamuel Bartlett, Dentist is calling to discuss mutual patient. Please call at [phone]. Thanks.

## 2016-08-07 NOTE — Telephone Encounter (Signed)
I will forward this message to the anticoagulation clinic so they can arrange pt holding warfarin and schedule INR check prior to extraction.  Please see note from Dr Excell Seltzerooper.

## 2016-08-07 NOTE — Telephone Encounter (Signed)
I spoke with Dr. Hortencia PilarBartlett. We decided mutually the best plan is for the patient to hold warfarin 3 days prior to dental extraction, then resume the following day as long as no significant bleeding problems arise. The patient should come into our office for an INR check the day before his extraction.

## 2016-08-07 NOTE — Telephone Encounter (Signed)
Called pt & instructed to take last dose on 08/17/16 for tooth extraction on 08/21/16 per Dr. Earmon Phoenixooper's orders. Made pt appt on 08/20/16 per Dr. Danella Sensingopper's note; pt will come in after work. Pt placed me on speaker phone so wife could hear the date to take last dose & when INR appt would be.

## 2016-08-12 NOTE — Telephone Encounter (Signed)
This encounter was created in error - please disregard.

## 2016-08-20 ENCOUNTER — Ambulatory Visit (INDEPENDENT_AMBULATORY_CARE_PROVIDER_SITE_OTHER): Payer: Managed Care, Other (non HMO) | Admitting: *Deleted

## 2016-08-20 DIAGNOSIS — Z7901 Long term (current) use of anticoagulants: Secondary | ICD-10-CM

## 2016-08-20 DIAGNOSIS — Z952 Presence of prosthetic heart valve: Secondary | ICD-10-CM | POA: Diagnosis not present

## 2016-08-20 DIAGNOSIS — I359 Nonrheumatic aortic valve disorder, unspecified: Secondary | ICD-10-CM | POA: Diagnosis not present

## 2016-08-20 DIAGNOSIS — Z5181 Encounter for therapeutic drug level monitoring: Secondary | ICD-10-CM | POA: Diagnosis not present

## 2016-08-20 LAB — POCT INR: INR: 1.7

## 2016-08-20 NOTE — Patient Instructions (Addendum)
When you resume Coumadin tomorrow take an extra 1/2 tablet for 2 doses-on Thursday & Friday, then resume normal Coumadin dose.

## 2016-08-29 ENCOUNTER — Ambulatory Visit (INDEPENDENT_AMBULATORY_CARE_PROVIDER_SITE_OTHER): Payer: Managed Care, Other (non HMO)

## 2016-08-29 DIAGNOSIS — Z952 Presence of prosthetic heart valve: Secondary | ICD-10-CM

## 2016-08-29 DIAGNOSIS — I359 Nonrheumatic aortic valve disorder, unspecified: Secondary | ICD-10-CM | POA: Diagnosis not present

## 2016-08-29 DIAGNOSIS — Z7901 Long term (current) use of anticoagulants: Secondary | ICD-10-CM | POA: Diagnosis not present

## 2016-08-29 DIAGNOSIS — Z5181 Encounter for therapeutic drug level monitoring: Secondary | ICD-10-CM | POA: Diagnosis not present

## 2016-08-29 LAB — POCT INR: INR: 2.7

## 2016-09-11 ENCOUNTER — Encounter: Payer: Self-pay | Admitting: Cardiovascular Disease

## 2016-09-15 ENCOUNTER — Telehealth: Payer: Self-pay

## 2016-09-15 NOTE — Telephone Encounter (Signed)
Pt not ready to schedule sleep study. Will call back when ready.

## 2016-09-16 ENCOUNTER — Other Ambulatory Visit: Payer: Self-pay | Admitting: Internal Medicine

## 2016-09-16 DIAGNOSIS — R2241 Localized swelling, mass and lump, right lower limb: Secondary | ICD-10-CM

## 2016-09-19 ENCOUNTER — Ambulatory Visit
Admission: RE | Admit: 2016-09-19 | Discharge: 2016-09-19 | Disposition: A | Discharge status: Home / Self Care | Payer: Managed Care, Other (non HMO) | Source: Ambulatory Visit | Attending: Internal Medicine | Admitting: Internal Medicine

## 2016-09-19 DIAGNOSIS — R2241 Localized swelling, mass and lump, right lower limb: Secondary | ICD-10-CM

## 2016-09-26 ENCOUNTER — Ambulatory Visit: Payer: Self-pay | Admitting: Cardiovascular Disease

## 2016-09-29 ENCOUNTER — Other Ambulatory Visit: Payer: Self-pay | Admitting: Cardiovascular Disease

## 2016-09-29 ENCOUNTER — Ambulatory Visit (INDEPENDENT_AMBULATORY_CARE_PROVIDER_SITE_OTHER): Payer: Managed Care, Other (non HMO) | Admitting: *Deleted

## 2016-09-29 DIAGNOSIS — Z952 Presence of prosthetic heart valve: Secondary | ICD-10-CM | POA: Diagnosis not present

## 2016-09-29 DIAGNOSIS — I359 Nonrheumatic aortic valve disorder, unspecified: Secondary | ICD-10-CM

## 2016-09-29 DIAGNOSIS — Z7901 Long term (current) use of anticoagulants: Secondary | ICD-10-CM | POA: Diagnosis not present

## 2016-09-29 DIAGNOSIS — Z5181 Encounter for therapeutic drug level monitoring: Secondary | ICD-10-CM

## 2016-09-29 LAB — POCT INR: INR: 2.3

## 2016-11-10 ENCOUNTER — Ambulatory Visit (INDEPENDENT_AMBULATORY_CARE_PROVIDER_SITE_OTHER): Payer: Managed Care, Other (non HMO) | Admitting: *Deleted

## 2016-11-10 DIAGNOSIS — I359 Nonrheumatic aortic valve disorder, unspecified: Secondary | ICD-10-CM | POA: Diagnosis not present

## 2016-11-10 DIAGNOSIS — Z7901 Long term (current) use of anticoagulants: Secondary | ICD-10-CM | POA: Diagnosis not present

## 2016-11-10 DIAGNOSIS — Z5181 Encounter for therapeutic drug level monitoring: Secondary | ICD-10-CM | POA: Diagnosis not present

## 2016-11-10 DIAGNOSIS — Z952 Presence of prosthetic heart valve: Secondary | ICD-10-CM

## 2016-11-10 LAB — POCT INR: INR: 2.2

## 2016-12-17 ENCOUNTER — Ambulatory Visit (INDEPENDENT_AMBULATORY_CARE_PROVIDER_SITE_OTHER): Payer: Managed Care, Other (non HMO) | Admitting: Pharmacist

## 2016-12-17 DIAGNOSIS — Z952 Presence of prosthetic heart valve: Secondary | ICD-10-CM | POA: Diagnosis not present

## 2016-12-17 DIAGNOSIS — Z7901 Long term (current) use of anticoagulants: Secondary | ICD-10-CM

## 2016-12-17 DIAGNOSIS — I359 Nonrheumatic aortic valve disorder, unspecified: Secondary | ICD-10-CM

## 2016-12-17 DIAGNOSIS — Z5181 Encounter for therapeutic drug level monitoring: Secondary | ICD-10-CM | POA: Diagnosis not present

## 2016-12-17 LAB — POCT INR: INR: 2.2

## 2016-12-18 ENCOUNTER — Ambulatory Visit: Payer: Managed Care, Other (non HMO) | Admitting: Cardiovascular Disease

## 2017-01-01 ENCOUNTER — Ambulatory Visit: Payer: Self-pay | Admitting: Cardiovascular Disease

## 2017-01-15 ENCOUNTER — Other Ambulatory Visit: Payer: Self-pay | Admitting: Internal Medicine

## 2017-01-15 DIAGNOSIS — R229 Localized swelling, mass and lump, unspecified: Secondary | ICD-10-CM

## 2017-01-29 ENCOUNTER — Ambulatory Visit (INDEPENDENT_AMBULATORY_CARE_PROVIDER_SITE_OTHER): Payer: Managed Care, Other (non HMO)

## 2017-01-29 DIAGNOSIS — Z7901 Long term (current) use of anticoagulants: Secondary | ICD-10-CM | POA: Diagnosis not present

## 2017-01-29 DIAGNOSIS — Z952 Presence of prosthetic heart valve: Secondary | ICD-10-CM

## 2017-01-29 DIAGNOSIS — I359 Nonrheumatic aortic valve disorder, unspecified: Secondary | ICD-10-CM

## 2017-01-29 DIAGNOSIS — Z5181 Encounter for therapeutic drug level monitoring: Secondary | ICD-10-CM

## 2017-01-29 LAB — POCT INR: INR: 2.8

## 2017-02-16 IMAGING — CT CT HEAD W/O CM
2 series · 16 of 30 positions shown, 18 images · non-contrast
Comparison: Three days ago

CLINICAL DATA: Numbness on the left side of body with generalized
headache. Subdural hematoma.

EXAM:
CT HEAD WITHOUT CONTRAST
TECHNIQUE: Contiguous axial images were obtained from the base of the skull
through the vertex without intravenous contrast.

[Series 201: head w/o, idose (1) · axial · non-contrast · 0.49mm/px · z∈[+84,+214]mm · 8 of 34 slices shown, 10 images]
[im 4/34  brain]
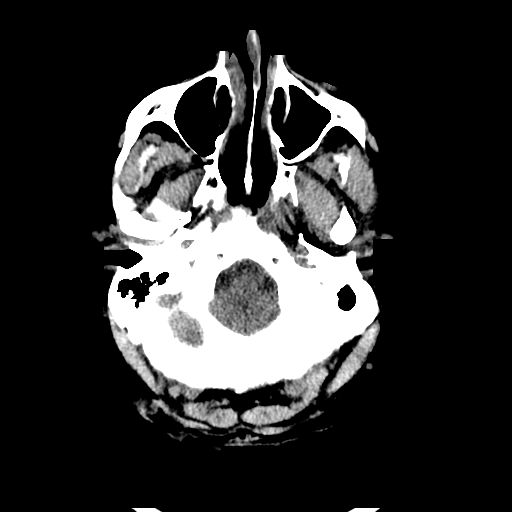
[im 4/34  bone]
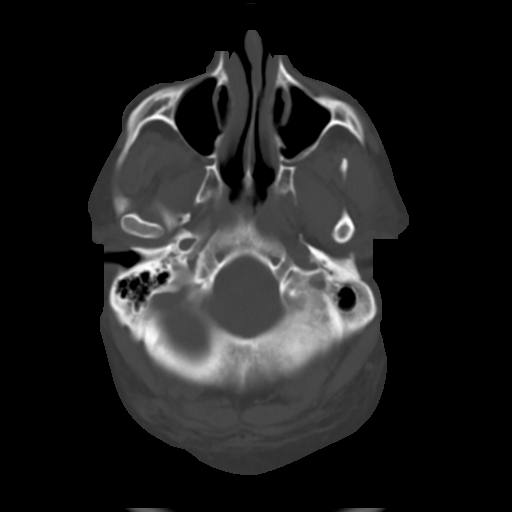
[im 8/34  brain]
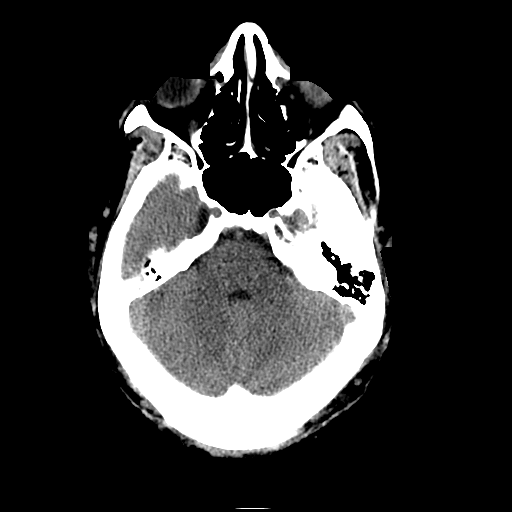
[im 12/34  brain]
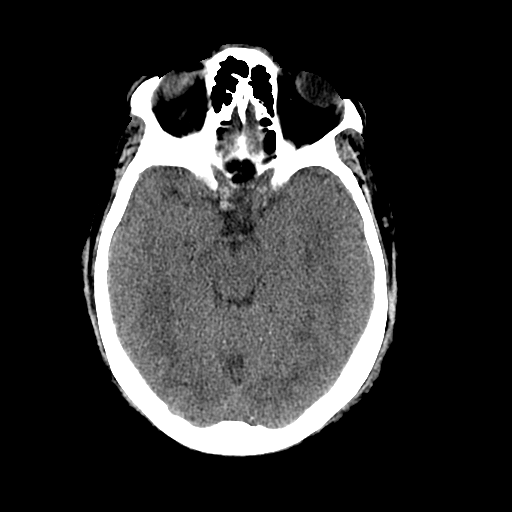
[im 15/34  brain]
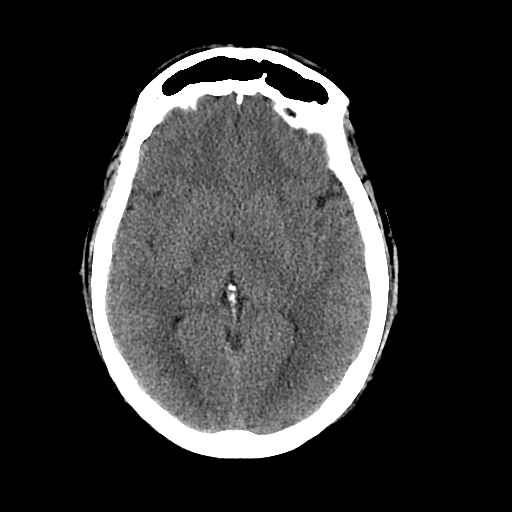
[im 19/34  brain]
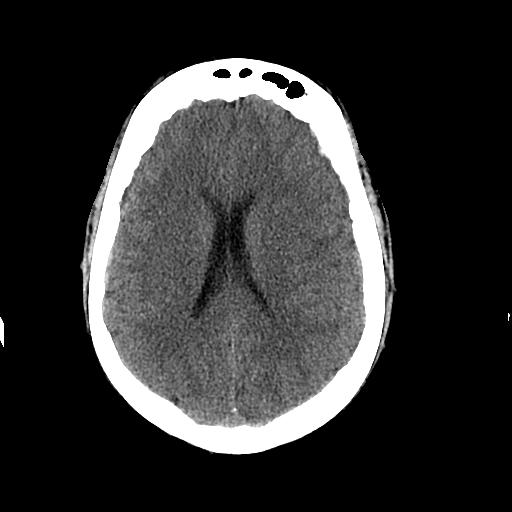
[im 19/34  bone]
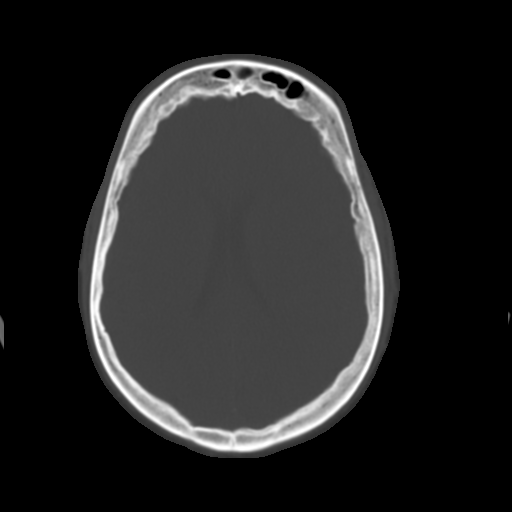
[im 23/34  brain]
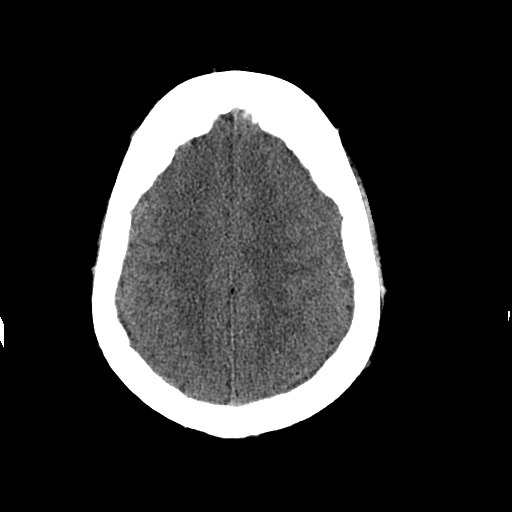
[im 26/34  brain]
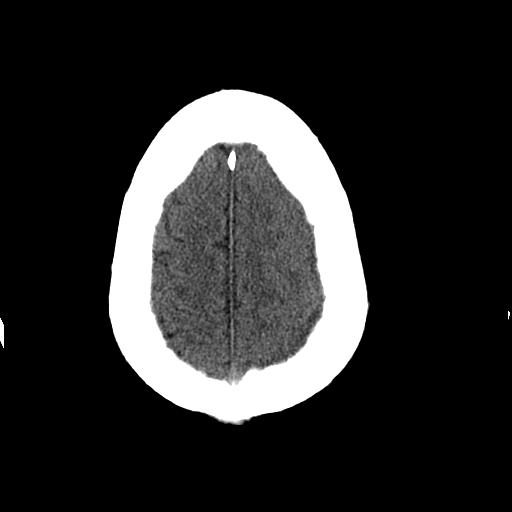
[im 30/34  brain]
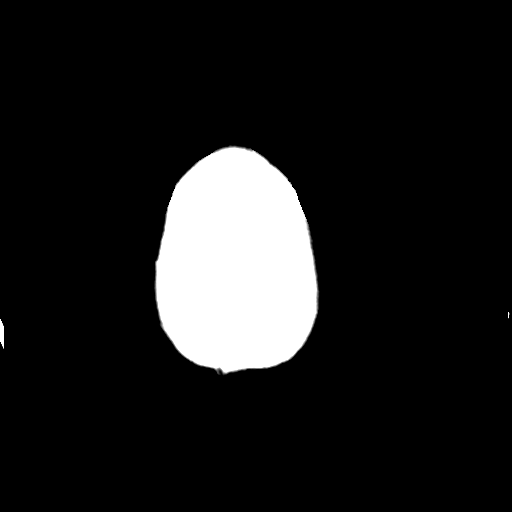

[Series 202: head w/o bone, idose (1) · axial · non-contrast · 0.49mm/px · z∈[+86,+216]mm · 8 of 68 slices shown]
[im 8/68  bone]
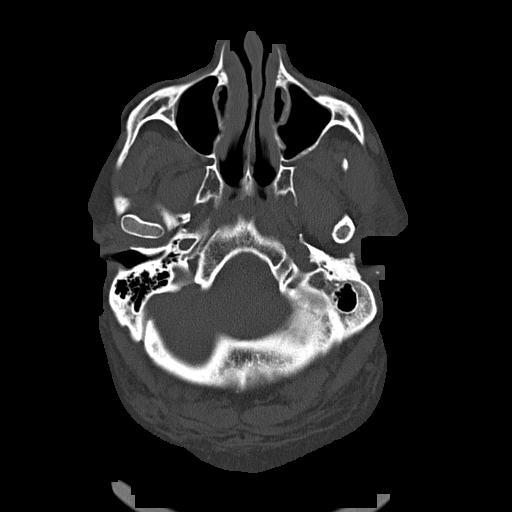
[im 15/68  bone]
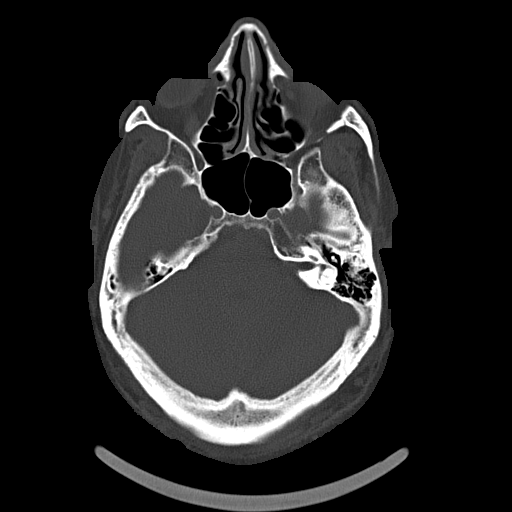
[im 22/68  bone]
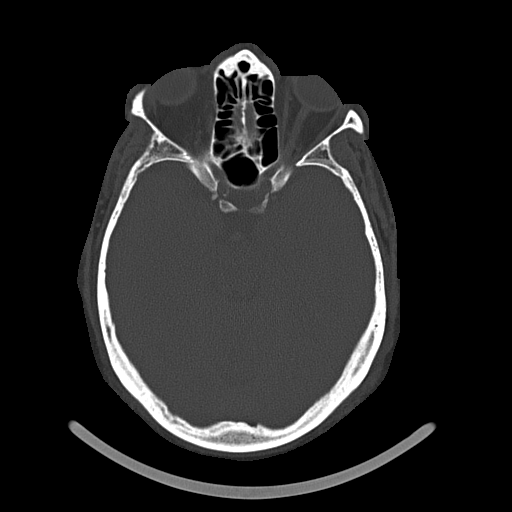
[im 29/68  bone]
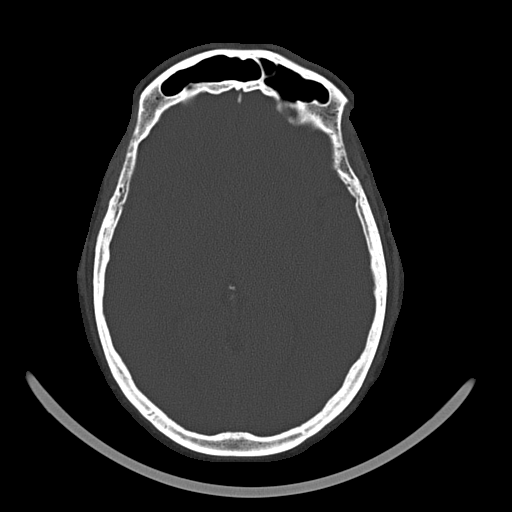
[im 39/68  bone]
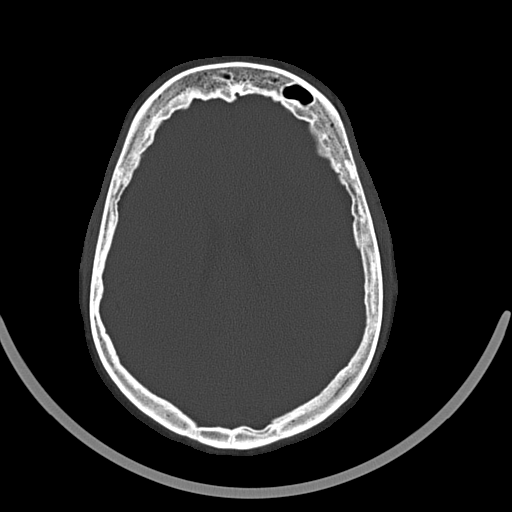
[im 46/68  bone]
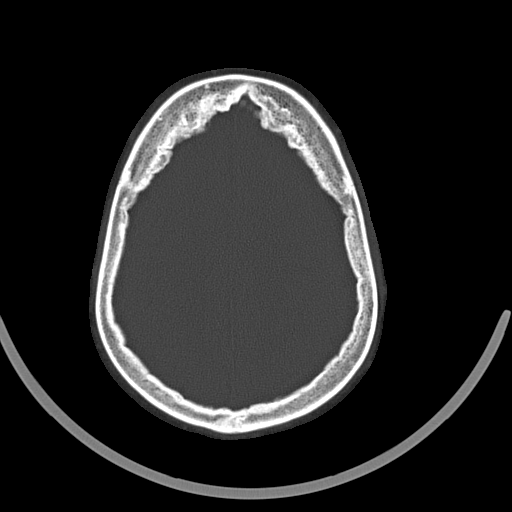
[im 53/68  bone]
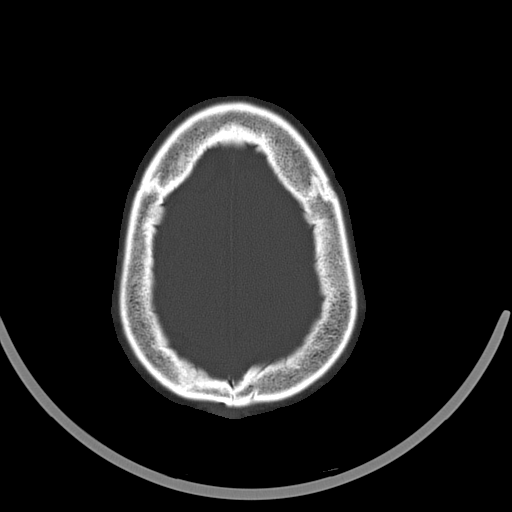
[im 60/68  bone]
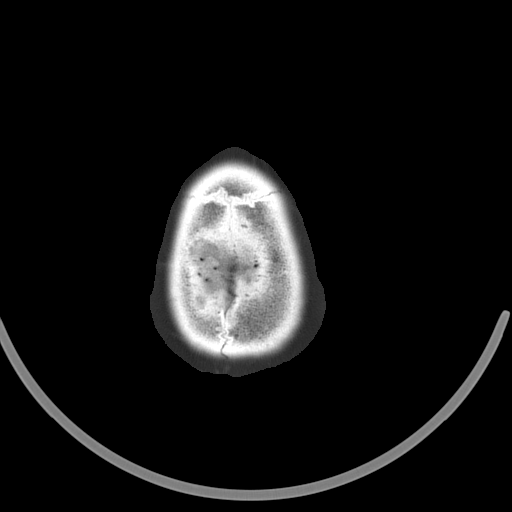

[16 of 30 positions shown; findings below may reference images not displayed]

FINDINGS: Skull and Sinuses:Mild mucosal edema in the paranasal sinuses.

Orbits: Right cataract resection.

Brain: The isodense left subdural hematoma is difficult to
visualize. No progressive mass effect or new high density to suggest
interval hemorrhage. No evidence of infarct, hydrocephalus, mass
lesion.
IMPRESSION: Known small left cerebral convexity subdural hematoma is isodense
and difficult visualize. No interval increase or new abnormality.

## 2017-03-12 ENCOUNTER — Ambulatory Visit (INDEPENDENT_AMBULATORY_CARE_PROVIDER_SITE_OTHER): Payer: Managed Care, Other (non HMO) | Admitting: *Deleted

## 2017-03-12 DIAGNOSIS — Z7901 Long term (current) use of anticoagulants: Secondary | ICD-10-CM | POA: Diagnosis not present

## 2017-03-12 DIAGNOSIS — Z5181 Encounter for therapeutic drug level monitoring: Secondary | ICD-10-CM

## 2017-03-12 DIAGNOSIS — Z952 Presence of prosthetic heart valve: Secondary | ICD-10-CM | POA: Diagnosis not present

## 2017-03-12 DIAGNOSIS — I359 Nonrheumatic aortic valve disorder, unspecified: Secondary | ICD-10-CM

## 2017-03-12 LAB — POCT INR: INR: 2.7

## 2017-03-15 ENCOUNTER — Other Ambulatory Visit: Payer: Self-pay | Admitting: Cardiovascular Disease

## 2017-03-15 DIAGNOSIS — Z7901 Long term (current) use of anticoagulants: Secondary | ICD-10-CM

## 2017-03-15 DIAGNOSIS — Z5181 Encounter for therapeutic drug level monitoring: Secondary | ICD-10-CM

## 2017-03-15 DIAGNOSIS — I359 Nonrheumatic aortic valve disorder, unspecified: Secondary | ICD-10-CM

## 2017-03-15 DIAGNOSIS — Z952 Presence of prosthetic heart valve: Secondary | ICD-10-CM

## 2017-04-03 ENCOUNTER — Encounter: Payer: Self-pay | Admitting: Cardiovascular Disease

## 2017-04-03 ENCOUNTER — Ambulatory Visit (INDEPENDENT_AMBULATORY_CARE_PROVIDER_SITE_OTHER): Payer: Managed Care, Other (non HMO) | Admitting: Cardiovascular Disease

## 2017-04-03 VITALS — BP 130/78 | HR 68 | Ht 74.0 in | Wt 313.8 lb

## 2017-04-03 DIAGNOSIS — I359 Nonrheumatic aortic valve disorder, unspecified: Secondary | ICD-10-CM

## 2017-04-03 DIAGNOSIS — G4733 Obstructive sleep apnea (adult) (pediatric): Secondary | ICD-10-CM

## 2017-04-03 DIAGNOSIS — I10 Essential (primary) hypertension: Secondary | ICD-10-CM | POA: Diagnosis not present

## 2017-04-03 NOTE — Progress Notes (Signed)
Cardiology Office Note Date:  04/07/2017   ID:  Lance BongoFrederick G Stankiewicz, DOB Sep 13, 1960, MRN [id number]  PCP:  Dorothyann PengSanders, Robyn, MD  Cardiologist:  Tonny Bollmanooper, Orchid Glassberg, MD    Chief Complaint  Patient presents with  . Leg Swelling     History of Present Illness: [name] is a 56 y.o. male who presents for follow-up evaluation.  The patient has a history of ascending aortic aneurysm and dissection , undergoing surgical treatment with a 27 mm St. Jude mechanical valve conduit and reimplantation of the coronary arteries in 2006.  Other medical problems include hypertension, hyperlipidemia, and obesity.  He hit his left leg on a table last year and had a lot of bleeding, requiring an ER visit. He had the same thing happen this spring on the other leg.   The patient is here alone today.  He still having problems with arthritis in his hips.  He has recently started taking oxycodone but he does not feel very well with this.  He denies any new cardiac symptoms.  He specifically denies chest pain, shortness of breath, or heart palpitations.  He does complain of bilateral leg swelling and he has been wearing some compression stockings.  Past Medical History:  Diagnosis Date  . Ascending aortic aneurysm and dissection    2006 repaired  7 cm aneurysm  . Chest pain, atypical   . Coronary artery reimplantation   . Hyperlipidemia    Mixed  . Hypertension    Unspecified  . Obesity   . S/P aortic valve and root replacement    HX of St. Jude  . Seizures (HCC)   . Sleep apnea, obstructive     Past Surgical History:  Procedure Laterality Date  . Ascending aortic dissection aneurysm  12/06/2004   ok for MRI 1.5 or 3T Using a 27-mm St. Jude mechanical valve conduit with reimplantation of both coronary arteries  . CORNEAL TRANSPLANT     R   . KNEE SURGERY     R  . Replacement of aortic valve  12/06/2004    Current Outpatient Prescriptions  Medication Sig Dispense Refill  . aspirin 81 MG  tablet Take 81 mg by mouth daily.    . Cholecalciferol (VITAMIN D) 2000 units tablet Take 2,000 Units by mouth 2 (two) times daily.    . metoprolol succinate (TOPROL-XL) 50 MG 24 hr tablet Take 50 mg by mouth daily.     . Multiple Vitamin (MULTIVITAMIN WITH MINERALS) TABS tablet Take 1 tablet by mouth daily.    Marland Kitchen. oxyCODONE-acetaminophen (PERCOCET/ROXICET) 5-325 MG tablet Take 1 tablet by mouth every 6 (six) hours as needed. AS NEEDED FOR PAIN    . pregabalin (LYRICA) 75 MG capsule Take 75 mg by mouth 2 (two) times daily.    . rosuvastatin (CRESTOR) 10 MG tablet Take 10 mg by mouth daily.     . traMADol (ULTRAM) 50 MG tablet Take 50 mg by mouth every 6 (six) hours as needed.    . vitamin C (ASCORBIC ACID) 500 MG tablet Take 500 mg by mouth daily.    Marland Kitchen. warfarin (COUMADIN) 5 MG tablet TAKE AS DIRECTED BY COUMADIN CLINIC 135 tablet 1   No current facility-administered medications for this visit.     Allergies:   Nsaids   Social History:  The patient  reports that he quit smoking about 27 years ago. His smoking use included Cigarettes. He has a 1.80 pack-year smoking history. He has quit using smokeless tobacco. He reports  that he does not drink alcohol or use drugs.   Family History:  The patient's family history includes Cervical cancer in his mother and sister; Coronary artery disease in his mother; Diabetes in his father and mother.    ROS:  Please see the history of present illness.  Otherwise, review of systems is positive for leg swelling, rash, snoring, joint swelling, bleeding.  All other systems are reviewed and negative.    PHYSICAL EXAM: VS:  BP 130/78   Pulse 68   Ht 6\' 2"  (1.88 m)   Wt (!) 313 lb 12.8 oz (142.3 kg)   SpO2 96%   BMI 40.29 kg/m  , BMI Body mass index is 40.29 kg/m. GEN: Well nourished, well developed, pleasant obese male in no acute distress  HEENT: normal  Neck: no JVD, no masses. No carotid bruits Cardiac: RRR with 2/6 SEM at the RUSB, no diastolic  murmur, crisp mechanical A2               Respiratory:  clear to auscultation bilaterally, normal work of breathing GI: soft, nontender, nondistended, + BS MS: no deformity or atrophy  Ext: 1+ bilateral pretibial edema Skin: warm and dry, darkening of the lower legs c/w stasis dermatitis Neuro:  Strength and sensation are intact Psych: euthymic mood, full affect  EKG:  EKG is ordered today. The ekg ordered today shows normal sinus rhythm with PACs.  Recent Labs: 07/26/2016: ALT 27; BUN 17; Creatinine, Ser 0.81; Hemoglobin 12.5; Platelets 173; Potassium 3.5; Sodium 143   Lipid Panel  No results found for: CHOL, TRIG, HDL, CHOLHDL, VLDL, LDLCALC, LDLDIRECT    Wt Readings from Last 3 Encounters:  04/03/17 (!) 313 lb 12.8 oz (142.3 kg)  03/18/16 (!) 308 lb (139.7 kg)  03/15/16 (!) 307 lb (139.3 kg)     Cardiac Studies Reviewed: 2D Echo 03/18/2015: Study Conclusions  - Left ventricle: The cavity size was normal. Wall thickness was increased in a pattern of severe LVH. Systolic function was normal. The estimated ejection fraction was in the range of 50% to 55%. Diastolic function is abnormal, indeterminate grade. Wall motion was normal; there were no regional wall motion abnormalities. - Aortic valve: There was no stenosis. There was no significant regurgitation. Valve area (VTI): 1.71 cm^2. - Left atrium: The atrium was moderately dilated. - Right ventricle: The cavity size was mildly dilated. - Right atrium: The atrium was mildly dilated. - Technically adequate study.  ASSESSMENT AND PLAN: 1.  Aortic valve disorder status post mechanical aortic valve replacement: The patient is tolerating aspirin and warfarin.  He has had some bleeding issues with mild leg trauma.  His most recent echocardiogram as outlined above.  His exam remains unchanged.    2.  Chronic anticoagulation:   Continue with combination of low-dose aspirin and warfarin.  3.  Hyperlipidemia: The  patient is treated with a statin drug (Crestor 10 mg daily).  4.  Obesity: Discussed the importance of weight loss, diet, and exercise.  5.  Leg swelling: He has typical skin changes of chronic stasis dermatitis.  He is wearing compression stockings.  We gave him some information on the specifics of leg elevation.  Current medicines are reviewed with the patient today.  The patient does not have concerns regarding medicines.  Labs/ tests ordered today include:   Orders Placed This Encounter  Procedures  . EKG 12-Lead  . Split night study    Disposition:   FU one year  Signed, Tonny Bollman, MD  04/07/2017 5:37 AM    [facility] [address] [place], [place], [place]  [address] Phone: [phone]; Fax: [phone]

## 2017-04-03 NOTE — Patient Instructions (Signed)
Medication Instructions:  Your provider recommends that you continue on your current medications as directed. Please refer to the Current Medication list given to you today.    Labwork: None  Testing/Procedures: Your physician has recommended that you have a sleep study. This test records several body functions during sleep, including: brain activity, eye movement, oxygen and carbon dioxide blood levels, heart rate and rhythm, breathing rate and rhythm, the flow of air through your mouth and nose, snoring, body muscle movements, and chest and belly movement.  Follow-Up: Your provider wants you to follow-up in: 1 year with Dr. Excell Seltzerooper. You will receive a reminder letter in the mail two months in advance. If you don't receive a letter, please call our office to schedule the follow-up appointment.    Any Other Special Instructions Will Be Listed Below (If Applicable).   For your  leg edema you  should do  the following 1. Leg elevation - I recommend the Lounge Dr. Leg rest.  See below for details  2. Salt restriction  -  Use potassium chloride instead of regular salt as a salt substitute. 3. Walk regularly 4. Compression hose - [facility] 5. Weight loss     Go to [email or web address]          If you need a refill on your cardiac medications before your next appointment, please call your pharmacy.

## 2017-04-17 ENCOUNTER — Telehealth: Payer: Self-pay | Admitting: *Deleted

## 2017-04-17 NOTE — Telephone Encounter (Signed)
-----   Message from Henrietta DineKathryn A Kemp, RN sent at 04/03/2017  4:55 PM EDT ----- Regarding: Sleep study ordered  Sleep study ordered per Dr. Excell Seltzerooper 10/26 for OSA  ESS= 21  Thanks!

## 2017-04-17 NOTE — Telephone Encounter (Signed)
Informed patient of upcoming sleep study and patient understanding was verbalized. Patient understands her sleep study is scheduled for Monday May 18 2017. Patient [facility]. Patient understands he will receive a sleep packet in a week or so. Patient understands to call if he does not receive the sleep packet in a timely manner. Patient agrees with treatment and thanked me for call.

## 2017-04-23 ENCOUNTER — Ambulatory Visit (INDEPENDENT_AMBULATORY_CARE_PROVIDER_SITE_OTHER): Payer: Managed Care, Other (non HMO) | Admitting: *Deleted

## 2017-04-23 DIAGNOSIS — Z5181 Encounter for therapeutic drug level monitoring: Secondary | ICD-10-CM | POA: Diagnosis not present

## 2017-04-23 DIAGNOSIS — Z952 Presence of prosthetic heart valve: Secondary | ICD-10-CM | POA: Diagnosis not present

## 2017-04-23 DIAGNOSIS — Z7901 Long term (current) use of anticoagulants: Secondary | ICD-10-CM

## 2017-04-23 DIAGNOSIS — I359 Nonrheumatic aortic valve disorder, unspecified: Secondary | ICD-10-CM

## 2017-04-23 LAB — POCT INR: INR: 2.2

## 2017-04-23 NOTE — Patient Instructions (Signed)
Continue on same dosage 1.5 tablets all days except 1 tablet on Fridays.  Recheck in 6 weeks.    Call us with any procedures or new medications [phone] & Fax number is [phone]  

## 2017-05-15 NOTE — Telephone Encounter (Addendum)
Patients sleep study is cancelled pending insurance approval and pending bad weather.  Patient notified on cell # [phone] and (H) [phone].  Spoke to wife DPR who verbalized understanding SS cancelled.

## 2017-05-18 ENCOUNTER — Encounter (HOSPITAL_BASED_OUTPATIENT_CLINIC_OR_DEPARTMENT_OTHER): Payer: Self-pay

## 2017-05-27 ENCOUNTER — Telehealth: Payer: Self-pay | Admitting: Cardiovascular Disease

## 2017-05-27 NOTE — Telephone Encounter (Signed)
Pt called about the status of his sleep study Authorization   Please give him a call back to schedule or when scheduled.

## 2017-06-04 ENCOUNTER — Ambulatory Visit (INDEPENDENT_AMBULATORY_CARE_PROVIDER_SITE_OTHER): Payer: Managed Care, Other (non HMO) | Admitting: *Deleted

## 2017-06-04 DIAGNOSIS — Z952 Presence of prosthetic heart valve: Secondary | ICD-10-CM

## 2017-06-04 DIAGNOSIS — I359 Nonrheumatic aortic valve disorder, unspecified: Secondary | ICD-10-CM | POA: Diagnosis not present

## 2017-06-04 DIAGNOSIS — Z5181 Encounter for therapeutic drug level monitoring: Secondary | ICD-10-CM

## 2017-06-04 DIAGNOSIS — Z7901 Long term (current) use of anticoagulants: Secondary | ICD-10-CM

## 2017-06-04 LAB — POCT INR: INR: 1.9

## 2017-06-04 NOTE — Patient Instructions (Signed)
Description   Today take 2 tablets then continue on same dosage 1.5 tablets all days except 1 tablet on Fridays.  Recheck in 6 weeks    Call us with any procedures or new medications [phone] & Fax number is [phone]

## 2017-06-25 ENCOUNTER — Other Ambulatory Visit: Payer: Self-pay | Admitting: Family Medicine

## 2017-06-25 ENCOUNTER — Ambulatory Visit
Admission: RE | Admit: 2017-06-25 | Discharge: 2017-06-25 | Disposition: A | Discharge status: Home / Self Care | Payer: Managed Care, Other (non HMO) | Source: Ambulatory Visit | Attending: Family Medicine | Admitting: Family Medicine

## 2017-06-25 DIAGNOSIS — M25551 Pain in right hip: Secondary | ICD-10-CM

## 2017-06-25 DIAGNOSIS — M545 Low back pain: Secondary | ICD-10-CM

## 2017-07-16 ENCOUNTER — Ambulatory Visit (INDEPENDENT_AMBULATORY_CARE_PROVIDER_SITE_OTHER): Payer: Managed Care, Other (non HMO) | Admitting: Pharmacist

## 2017-07-16 DIAGNOSIS — Z7901 Long term (current) use of anticoagulants: Secondary | ICD-10-CM | POA: Diagnosis not present

## 2017-07-16 DIAGNOSIS — Z952 Presence of prosthetic heart valve: Secondary | ICD-10-CM | POA: Diagnosis not present

## 2017-07-16 DIAGNOSIS — I359 Nonrheumatic aortic valve disorder, unspecified: Secondary | ICD-10-CM

## 2017-07-16 DIAGNOSIS — Z5181 Encounter for therapeutic drug level monitoring: Secondary | ICD-10-CM

## 2017-07-16 LAB — POCT INR: INR: 2.5

## 2017-07-16 NOTE — Patient Instructions (Signed)
Description   Continue on same dosage 1.5 tablets all days except 1 tablet on Fridays.  Recheck in 6 weeks    Call us with any procedures or new medications [phone] & Fax number is [phone]

## 2017-08-19 ENCOUNTER — Other Ambulatory Visit: Payer: Self-pay | Admitting: Cardiovascular Disease

## 2017-08-19 DIAGNOSIS — Z5181 Encounter for therapeutic drug level monitoring: Secondary | ICD-10-CM

## 2017-08-19 DIAGNOSIS — Z952 Presence of prosthetic heart valve: Secondary | ICD-10-CM

## 2017-08-19 DIAGNOSIS — I359 Nonrheumatic aortic valve disorder, unspecified: Secondary | ICD-10-CM

## 2017-08-19 DIAGNOSIS — Z7901 Long term (current) use of anticoagulants: Secondary | ICD-10-CM

## 2017-08-27 ENCOUNTER — Ambulatory Visit (INDEPENDENT_AMBULATORY_CARE_PROVIDER_SITE_OTHER): Payer: Managed Care, Other (non HMO) | Admitting: *Deleted

## 2017-08-27 DIAGNOSIS — Z5181 Encounter for therapeutic drug level monitoring: Secondary | ICD-10-CM | POA: Diagnosis not present

## 2017-08-27 DIAGNOSIS — I359 Nonrheumatic aortic valve disorder, unspecified: Secondary | ICD-10-CM

## 2017-08-27 DIAGNOSIS — Z952 Presence of prosthetic heart valve: Secondary | ICD-10-CM

## 2017-08-27 DIAGNOSIS — Z7901 Long term (current) use of anticoagulants: Secondary | ICD-10-CM | POA: Diagnosis not present

## 2017-08-27 LAB — POCT INR: INR: 2.4

## 2017-08-27 NOTE — Patient Instructions (Signed)
Description   Continue on same dosage 1.5 tablets all days except 1 tablet on Fridays.  Recheck in 6 weeks    Call us with any procedures or new medications [phone] & Fax number is [phone]

## 2017-09-15 ENCOUNTER — Institutional Professional Consult (permissible substitution): Payer: Self-pay | Admitting: Pulmonary Disease

## 2017-10-07 ENCOUNTER — Ambulatory Visit: Payer: Self-pay | Admitting: Podiatry

## 2017-10-12 ENCOUNTER — Ambulatory Visit (INDEPENDENT_AMBULATORY_CARE_PROVIDER_SITE_OTHER): Payer: Managed Care, Other (non HMO) | Admitting: *Deleted

## 2017-10-12 DIAGNOSIS — I359 Nonrheumatic aortic valve disorder, unspecified: Secondary | ICD-10-CM

## 2017-10-12 DIAGNOSIS — Z7901 Long term (current) use of anticoagulants: Secondary | ICD-10-CM | POA: Diagnosis not present

## 2017-10-12 DIAGNOSIS — Z952 Presence of prosthetic heart valve: Secondary | ICD-10-CM | POA: Diagnosis not present

## 2017-10-12 DIAGNOSIS — Z5181 Encounter for therapeutic drug level monitoring: Secondary | ICD-10-CM

## 2017-10-12 LAB — POCT INR: INR: 2.6

## 2017-10-12 NOTE — Patient Instructions (Signed)
Description   Continue on same dosage 1.5 tablets all days except 1 tablet on Fridays.  Recheck in 6 weeks    Call us with any procedures or new medications [phone] & Fax number is [phone]

## 2017-11-20 ENCOUNTER — Ambulatory Visit (INDEPENDENT_AMBULATORY_CARE_PROVIDER_SITE_OTHER): Payer: Managed Care, Other (non HMO) | Admitting: *Deleted

## 2017-11-20 DIAGNOSIS — Z5181 Encounter for therapeutic drug level monitoring: Secondary | ICD-10-CM

## 2017-11-20 DIAGNOSIS — I359 Nonrheumatic aortic valve disorder, unspecified: Secondary | ICD-10-CM | POA: Diagnosis not present

## 2017-11-20 DIAGNOSIS — Z7901 Long term (current) use of anticoagulants: Secondary | ICD-10-CM | POA: Diagnosis not present

## 2017-11-20 DIAGNOSIS — Z952 Presence of prosthetic heart valve: Secondary | ICD-10-CM

## 2017-11-20 LAB — POCT INR: INR: 2.3 (ref 2.0–3.0)

## 2017-11-20 NOTE — Patient Instructions (Signed)
Description   Continue on same dosage 1.5 tablets all days except 1 tablet on Fridays.  Recheck in 6 weeks.    Call us with any procedures or new medications [phone] & Fax number is [phone]

## 2017-11-23 ENCOUNTER — Encounter: Payer: Self-pay | Admitting: Podiatry

## 2017-11-23 ENCOUNTER — Ambulatory Visit (INDEPENDENT_AMBULATORY_CARE_PROVIDER_SITE_OTHER): Payer: Managed Care, Other (non HMO) | Admitting: Podiatry

## 2017-11-23 VITALS — BP 160/80 | HR 69 | Resp 16 | Ht 74.0 in | Wt 316.0 lb

## 2017-11-23 DIAGNOSIS — L84 Corns and callosities: Secondary | ICD-10-CM

## 2017-11-23 DIAGNOSIS — S065X9S Traumatic subdural hemorrhage with loss of consciousness of unspecified duration, sequela: Secondary | ICD-10-CM | POA: Insufficient documentation

## 2017-11-23 DIAGNOSIS — J00 Acute nasopharyngitis [common cold]: Secondary | ICD-10-CM | POA: Insufficient documentation

## 2017-11-23 DIAGNOSIS — S0093XA Contusion of unspecified part of head, initial encounter: Secondary | ICD-10-CM | POA: Insufficient documentation

## 2017-11-23 DIAGNOSIS — G894 Chronic pain syndrome: Secondary | ICD-10-CM | POA: Insufficient documentation

## 2017-11-23 DIAGNOSIS — I8311 Varicose veins of right lower extremity with inflammation: Secondary | ICD-10-CM | POA: Insufficient documentation

## 2017-11-23 DIAGNOSIS — M79675 Pain in left toe(s): Secondary | ICD-10-CM | POA: Diagnosis not present

## 2017-11-23 DIAGNOSIS — B351 Tinea unguium: Secondary | ICD-10-CM | POA: Diagnosis not present

## 2017-11-23 DIAGNOSIS — R319 Hematuria, unspecified: Secondary | ICD-10-CM | POA: Insufficient documentation

## 2017-11-23 DIAGNOSIS — R030 Elevated blood-pressure reading, without diagnosis of hypertension: Secondary | ICD-10-CM | POA: Insufficient documentation

## 2017-11-23 DIAGNOSIS — M79674 Pain in right toe(s): Secondary | ICD-10-CM

## 2017-11-23 DIAGNOSIS — M779 Enthesopathy, unspecified: Secondary | ICD-10-CM

## 2017-11-23 DIAGNOSIS — Z4802 Encounter for removal of sutures: Secondary | ICD-10-CM | POA: Insufficient documentation

## 2017-11-23 DIAGNOSIS — R229 Localized swelling, mass and lump, unspecified: Secondary | ICD-10-CM | POA: Insufficient documentation

## 2017-11-23 DIAGNOSIS — F0781 Postconcussional syndrome: Secondary | ICD-10-CM | POA: Insufficient documentation

## 2017-11-23 DIAGNOSIS — R413 Other amnesia: Secondary | ICD-10-CM | POA: Insufficient documentation

## 2017-11-23 DIAGNOSIS — Z79899 Other long term (current) drug therapy: Secondary | ICD-10-CM | POA: Insufficient documentation

## 2017-11-23 DIAGNOSIS — K649 Unspecified hemorrhoids: Secondary | ICD-10-CM | POA: Insufficient documentation

## 2017-11-23 DIAGNOSIS — R609 Edema, unspecified: Secondary | ICD-10-CM | POA: Insufficient documentation

## 2017-11-23 DIAGNOSIS — D649 Anemia, unspecified: Secondary | ICD-10-CM | POA: Insufficient documentation

## 2017-11-23 DIAGNOSIS — M25551 Pain in right hip: Secondary | ICD-10-CM | POA: Insufficient documentation

## 2017-11-23 DIAGNOSIS — D509 Iron deficiency anemia, unspecified: Secondary | ICD-10-CM | POA: Insufficient documentation

## 2017-11-23 DIAGNOSIS — R7309 Other abnormal glucose: Secondary | ICD-10-CM | POA: Insufficient documentation

## 2017-11-23 DIAGNOSIS — M25559 Pain in unspecified hip: Secondary | ICD-10-CM | POA: Insufficient documentation

## 2017-11-23 DIAGNOSIS — K219 Gastro-esophageal reflux disease without esophagitis: Secondary | ICD-10-CM | POA: Insufficient documentation

## 2017-11-23 DIAGNOSIS — D689 Coagulation defect, unspecified: Secondary | ICD-10-CM

## 2017-11-23 DIAGNOSIS — S80811S Abrasion, right lower leg, sequela: Secondary | ICD-10-CM | POA: Insufficient documentation

## 2017-11-23 DIAGNOSIS — I491 Atrial premature depolarization: Secondary | ICD-10-CM | POA: Insufficient documentation

## 2017-11-23 DIAGNOSIS — F430 Acute stress reaction: Secondary | ICD-10-CM | POA: Insufficient documentation

## 2017-11-23 MED ORDER — TRIAMCINOLONE ACETONIDE 10 MG/ML IJ SUSP
10.0000 mg | Freq: Once | INTRAMUSCULAR | Status: AC
  Administered 2017-11-23: 10 mg

## 2017-11-23 NOTE — Progress Notes (Signed)
   Subjective:    Patient ID: [name], male    DOB: 02-12-1961, 57 y.o.   MRN: [id number]  HPI    Review of Systems  All other systems reviewed and are negative.      Objective:   Physical Exam        Assessment & Plan:

## 2017-11-25 NOTE — Progress Notes (Signed)
Subjective:   Patient ID: [name], male   DOB: 57 y.o.   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  Patient states he is tried to trim the area but he does have significant obesity and cannot reach it or see it and is unable to help himself.  Patient does not smoke currently and likes to be active   Review of Systems  All other systems reviewed and are negative.       Objective:  Physical Exam  Constitutional: He appears well-developed and well-nourished.  Cardiovascular: Intact distal pulses.  Pulmonary/Chest: Effort normal.  Musculoskeletal: Normal range of motion.  Neurological: He is alert.  Skin: Skin is warm.  Nursing note and vitals reviewed.   Neurovascular status found to be intact with muscle strength adequate range of motion within normal limits.  Patient does have significant obesity and I noted there is discoloration of his ankle region bilateral with the probability for vein disease which I do think at one point will need to evaluate and also very important to lose weight.  Today I found him to have a severe plantar keratotic lesion sub-fifth metatarsal left is very thickened it is painful when pressed with fluid buildup and is also noted to have nail disease 1-5 both feet that are thick yellow brittle and he cannot cut.  Patient is on blood thinner and cannot take care of these things himself and is at risk     Assessment:  Plantarflexed fifth metatarsal left with fluid buildup around the area with keratotic lesion formation with nail disease 1-5 both feet that are thick yellow brittle and he cannot cut with pain with patient being long-term obesity patient and works on cement floors and cannot take care of his feet     Plan:  H&P all conditions reviewed with him and caregiver today I went ahead and injected the fifth MPJ 3 mg  Kenalog 5 mg Xylocaine into the plantar capsule I debrided the lesion fifth metatarsal and debrided nailbeds 1-5 both feet with no iatrogenic bleeding.  Patient will return for routine care and may ultimately require orthotics for possible osteotomy if symptoms were to persist

## 2017-12-31 ENCOUNTER — Ambulatory Visit (INDEPENDENT_AMBULATORY_CARE_PROVIDER_SITE_OTHER): Payer: Managed Care, Other (non HMO) | Admitting: Pharmacist

## 2017-12-31 DIAGNOSIS — Z952 Presence of prosthetic heart valve: Secondary | ICD-10-CM

## 2017-12-31 DIAGNOSIS — I359 Nonrheumatic aortic valve disorder, unspecified: Secondary | ICD-10-CM

## 2017-12-31 DIAGNOSIS — Z7901 Long term (current) use of anticoagulants: Secondary | ICD-10-CM | POA: Diagnosis not present

## 2017-12-31 DIAGNOSIS — Z5181 Encounter for therapeutic drug level monitoring: Secondary | ICD-10-CM | POA: Diagnosis not present

## 2017-12-31 LAB — POCT INR: INR: 2.3 (ref 2.0–3.0)

## 2017-12-31 NOTE — Patient Instructions (Signed)
Description   Continue on same dosage 1.5 tablets all days except 1 tablet on Fridays.  Recheck in 6 weeks.    Call us with any procedures or new medications [phone] & Fax number is [phone]

## 2018-01-07 ENCOUNTER — Telehealth: Payer: Self-pay

## 2018-01-07 NOTE — Telephone Encounter (Signed)
Received form of patient's BP readings: 6/6:   108/96 6/13: 180/110 6/27: 186/120 7/11: 172/96 7/25: 150/78  Left message to call back.

## 2018-01-07 NOTE — Telephone Encounter (Signed)
Had a very lengthy conversation with the patient. He has not checked his BP since the last reading on 7/25. When asked if he had symptoms when his BP was elevated, he said he was not sure. He did report he has had some intermittent chest pressure lasting about 30 minutes to 1 hour a few times, but is unsure if the symptoms correlated. He also states he has some LE swelling. He currently denies CP and says it "has not happened in a while."  His PCP gave him a diuretic about 1 month ago for LE swelling but he "fired her" and is waiting for a new consult. Scheduled patient for evaluation 8/6 with Harriet PhoK. Lawrence. He understands to strictly limit salt, elevate his legs while sitting, and seek emergency medical attention if symptoms occur again. He was grateful for call.

## 2018-01-11 ENCOUNTER — Encounter: Payer: Self-pay | Admitting: Internal Medicine

## 2018-01-11 ENCOUNTER — Ambulatory Visit: Payer: Managed Care, Other (non HMO) | Admitting: Internal Medicine

## 2018-01-11 VITALS — BP 136/74 | HR 64 | Ht 74.0 in | Wt 324.2 lb

## 2018-01-11 DIAGNOSIS — G4733 Obstructive sleep apnea (adult) (pediatric): Secondary | ICD-10-CM

## 2018-01-11 NOTE — Progress Notes (Signed)
01/11/2018-57 year old male former smoker for sleep evaluation. Sleep Consult: Pt had sleep study about 10 years ago-had CPAP in past but has not worn it in about 1.5 years.  Epworth score 16 Body weight today 324 pounds Medical problem list includes HBP, Aortic Valve Replacement, GERD, obesity, restrictive lung disease, He remembers being better off with CPAP but then he lost weight and hoped he did not need it.  He has gained weight.  Snoring loudly.  Daytime sleepiness.  No sleep medicines.  Moderate caffeine. No ENT surgery or lung disease.  History of traumatic subdural hematoma with question of seizure at that time but no seizure since.  He denies parasomnias.  Prior to Admission medications   Medication Sig Start Date End Date Taking? Authorizing Provider  aspirin 81 MG tablet Take 81 mg by mouth daily.   Yes [provider]  Cholecalciferol (VITAMIN D) 2000 units tablet Take 2,000 Units by mouth 2 (two) times daily.   Yes [provider]  metoprolol succinate (TOPROL-XL) 50 MG 24 hr tablet Take 50 mg by mouth daily.  08/29/13  Yes [provider]  Multiple Vitamins-Minerals (CENTRUM SILVER ADULT 50+) TABS 1 qd   Yes [provider]  oxyCODONE-acetaminophen (PERCOCET/ROXICET) 5-325 MG tablet Take 1 tablet by mouth every 6 (six) hours as needed. AS NEEDED FOR PAIN 03/31/17  Yes [provider]  pregabalin (LYRICA) 75 MG capsule Take 75 mg by mouth 2 (two) times daily.   Yes [provider]  rosuvastatin (CRESTOR) 10 MG tablet Take 10 mg by mouth daily.    Yes [provider]  vitamin C (ASCORBIC ACID) 500 MG tablet Take 500 mg by mouth daily.   Yes [provider]  warfarin (COUMADIN) 5 MG tablet TAKE AS DIRECTED BY COUMADIN CLINIC 08/19/17  Yes Tonny Bollman, MD   Past Medical History:  Diagnosis Date  . Ascending aortic aneurysm and dissection    2006 repaired  7 cm aneurysm  . Chest pain, atypical   . Coronary  artery reimplantation   . Hyperlipidemia    Mixed  . Hypertension    Unspecified  . Obesity   . S/P aortic valve and root replacement    HX of St. Jude  . Seizures (HCC)   . Sleep apnea, obstructive    Past Surgical History:  Procedure Laterality Date  . Ascending aortic dissection aneurysm  12/06/2004   ok for MRI 1.5 or 3T Using a 27-mm St. Jude mechanical valve conduit with reimplantation of both coronary arteries  . CORNEAL TRANSPLANT     R   . KNEE SURGERY     R  . Replacement of aortic valve  12/06/2004   Family History  Problem Relation Age of Onset  . Cervical cancer Mother   . Coronary artery disease Mother   . Diabetes Mother   . Diabetes Father   . Cervical cancer Sister    Social History   Socioeconomic History  . Marital status: Married    Spouse name: Not on file  . Number of children: 0  . Years of education: 38  . Highest education level: Not on file  Occupational History  . Occupation: MIXER/PACKER    Employer: [employer]    Comment: works at [employer]- inhales vapors  Social Needs  . Financial resource strain: Not on file  . Food insecurity:    Worry: Not on file    Inability: Not on file  . Transportation needs:  Medical: Not on file    Non-medical: Not on file  Tobacco Use  . Smoking status: Former Smoker    Packs/day: 0.10    Years: 18.00    Pack years: 1.80    Types: Cigarettes    Last attempt to quit: 12/07/1989    Years since quitting: 28.1  . Smokeless tobacco: Former Engineer, [employer]  Substance and Sexual Activity  . Alcohol use: No    Comment: Quit 1991  . Drug use: No  . Sexual activity: Not on file  Lifestyle  . Physical activity:    Days per week: Not on file    Minutes per session: Not on file  . Stress: Not on file  Relationships  . Social connections:    Talks on phone: Not on file    Gets together: Not on file    Attends religious service: Not on file    Active member of club or organization: Not on file     Attends meetings of clubs or organizations: Not on file    Relationship status: Not on file  . Intimate partner violence:    Fear of current or ex partner: Not on file    Emotionally abused: Not on file    Physically abused: Not on file    Forced sexual activity: Not on file  Other Topics Concern  . Not on file  Social History Narrative   Married   No regular exercise      Patient drinks 2 cups of coffee a day    Patient is right handed.    ROS-see HPI   + = positive Constitutional:    weight loss, night sweats, fevers, chills, fatigue, lassitude. HEENT:    +headaches, +difficulty swallowing, +tooth/dental problems, sore throat,       sneezing, itching, ear ache, nasal congestion, post nasal drip, snoring CV:    +chest pain, orthopnea, PND, swelling in lower extremities, anasarca,                                                  dizziness, palpitations Resp:   shortness of breath with exertion or at rest.                productive cough,   non-productive cough, coughing up of blood.              change in color of mucus.  wheezing.   Skin:    +rash or lesions. GI:  No-   heartburn, indigestion, abdominal pain, nausea, vomiting, diarrhea,                 change in bowel habits, loss of appetite GU: dysuria, change in color of urine, no urgency or frequency.   flank pain. MS:   +joint pain, stiffness, decreased range of motion, back pain. Neuro-     nothing unusual Psych:  change in mood or affect.  depression or anxiety.   memory loss.  OBJ- Physical Exam General- Alert, Oriented, Affect-appropriate, Distress- none acute, + obese Skin- rash-none, lesions- none, excoriation- none Lymphadenopathy- none Head- atraumatic            Eyes- Gross vision intact, PERRLA, conjunctivae and secretions clear            Ears- Hearing, canals-normal            Nose- Clear,  no-Septal dev, mucus, polyps, erosion, perforation             Throat- Mallampati II-III , mucosa clear , drainage-  none, tonsils- atrophic Neck- flexible , trachea midline, no stridor , thyroid nl, carotid no bruit Chest - symmetrical excursion , unlabored           Heart/CV- RRR , no murmur , no gallop  , no rub, nl s1 s2                           - JVD- none , edema- none, stasis changes- none, varices- none           Lung- clear to P&A, wheeze- none, cough- none , dullness-none, rub- none           Chest wall-  Abd-  Br/ Gen/ Rectal- Not done, not indicated Extrem- cyanosis- none, clubbing, none, atrophy- none, strength- nl Neuro- grossly intact to observation

## 2018-01-11 NOTE — Assessment & Plan Note (Signed)
Educated on importance of seeking normal weight.  Consider bariatric referral if appropriate.

## 2018-01-11 NOTE — Progress Notes (Signed)
Cardiology Office Note   Date:  01/12/2018   ID:  [name], DOB July 28, 1960, MRN [id number]  PCP:  Dorothyann PengSanders, Robyn, MD  Cardiologist: Dr.Cooper   Chief Complaint  Patient presents with  . Edema  . Chest Pain    when drinks carbonated drinks  . Shortness of Breath     History of Present Illness: [name] is a 57 y.o. male who presents for ongoing assessment of AAA 7 mm, with hx of aortic dissection, surgical intervention, AoV repair with 27 mm St. Jude mechanical valve conduit and reimplantation of the coronary arteries in 2006. Other hx includes HTN, hyperlipidemia, OSA, and obesity. When seen last by Dr. Excell Seltzerooper, on 04/03/2017 with complaints of bilateral hip pain and chronic bilateral leg swelling. He was to continue wearing compression stockings. He was also discussed weight loss, and increased exercise.   He is being followed by Dr. Jetty Duhamellinton Young for OSA and is schedule for a sleep study.   He is here today with complaints of LEE, some dyspnea. He denies chest pain or orthopnea.   Past Medical History:  Diagnosis Date  . Ascending aortic aneurysm and dissection    2006 repaired  7 cm aneurysm  . Chest pain, atypical   . Coronary artery reimplantation   . Hyperlipidemia    Mixed  . Hypertension    Unspecified  . Obesity   . S/P aortic valve and root replacement    HX of St. Jude  . Seizures (HCC)   . Sleep apnea, obstructive     Past Surgical History:  Procedure Laterality Date  . Ascending aortic dissection aneurysm  12/06/2004   ok for MRI 1.5 or 3T Using a 27-mm St. Jude mechanical valve conduit with reimplantation of both coronary arteries  . CORNEAL TRANSPLANT     R   . KNEE SURGERY     R  . Replacement of aortic valve  12/06/2004     Current Outpatient Medications  Medication Sig Dispense Refill  . aspirin 81 MG tablet Take 81 mg by mouth daily.    . Cholecalciferol (VITAMIN D) 2000 units tablet Take 2,000 Units by mouth 2 (two) times  daily.    . metoprolol succinate (TOPROL-XL) 50 MG 24 hr tablet Take 50 mg by mouth daily.     . Multiple Vitamins-Minerals (CENTRUM SILVER ADULT 50+) TABS 1 qd    . oxyCODONE-acetaminophen (PERCOCET/ROXICET) 5-325 MG tablet Take 1 tablet by mouth every 6 (six) hours as needed. AS NEEDED FOR PAIN    . pregabalin (LYRICA) 75 MG capsule Take 75 mg by mouth 2 (two) times daily.    . rosuvastatin (CRESTOR) 10 MG tablet Take 10 mg by mouth daily.     . vitamin C (ASCORBIC ACID) 500 MG tablet Take 500 mg by mouth daily.    Marland Kitchen. warfarin (COUMADIN) 5 MG tablet TAKE AS DIRECTED BY COUMADIN CLINIC 135 tablet 1   No current facility-administered medications for this visit.     Allergies:   Nsaids    Social History:  The patient  reports that he quit smoking about 28 years ago. His smoking use included cigarettes. He has a 1.80 pack-year smoking history. He has quit using smokeless tobacco. He reports that he does not drink alcohol or use drugs.   Family History:  The patient's family history includes Cervical cancer in his mother and sister; Coronary artery disease in his mother; Diabetes in his father and mother.    ROS: All other  systems are reviewed and negative. Unless otherwise mentioned in H&P    PHYSICAL EXAM: VS:  Ht 6\' 2"  (1.88 m)   BMI 41.62 kg/m  , BMI Body mass index is 41.62 kg/m. GEN: Well nourished, well developed, in no acute distress Morbidly obese  HEENT: normal  Neck: no JVD, carotid bruits, or masses Cardiac: IRRR; no murmurs, rubs, or gallops,no edema  Respiratory:  clear to auscultation bilaterally, normal work of breathing GI: soft, nontender, nondistended, + BS MS: no deformity or atrophy  Skin: warm and dry, no rash Neuro:  Strength and sensation are intact Psych: euthymic mood, full affect   EKG:  Atrial fibrillation rate of 65 bpm.  Recent Labs: No results found for requested labs within last 8760 hours.    Lipid Panel No results found for: CHOL, TRIG,  HDL, CHOLHDL, VLDL, LDLCALC, LDLDIRECT    Wt Readings from Last 3 Encounters:  01/11/18 (!) 324 lb 3.2 oz (147.1 kg)  11/23/17 (!) 316 lb (143.3 kg)  04/03/17 (!) 313 lb 12.8 oz (142.3 kg)      Other studies Reviewed: 2D Echo 03/18/2015: - Left ventricle: The cavity size was normal. Wall thickness was increased in a pattern of severe LVH. Systolic function was normal. The estimated ejection fraction was in the range of 50% to 55%. Diastolic function is abnormal, indeterminate grade. Wall motion was normal; there were no regional wall motion abnormalities. - Aortic valve: There was no stenosis. There was no significant regurgitation. Valve area (VTI): 1.71 cm^2. - Left atrium: The atrium was moderately dilated. - Right ventricle: The cavity size was mildly dilated. - Right atrium: The atrium was mildly dilated. - Technically adequate study.    ASSESSMENT AND PLAN:  1. New Onset Atrial Fibrillation: He is on metoprolol and coumadin. Heart rate is well controlled and is on anticoagulation. Has not been diagnosed with atrial fib in the past.  Will repeat his echo. May be related to OSA which he is being work up for by Dr. Maple Hudson.  Can consider DCCV once we repeat his echo.   2. Diastolic CHF: He is retaining fluid, but has chronic LEE. I will add low dose lasix 20 mg daily to assist with this. Repeat BMET in one week.   3. History of hypertension: BP is controlled. Adding lasix. Will re-evaluate his response on diuretic.   4. OSA: Being seen by Dr. Maple Hudson for this.   5. Morbid obesity: Weight loss and increase exercise is necessary.    Current medicines are reviewed at length with the patient today.    Labs/ tests ordered today include: Echo BMET   Bettey Mare. Liborio Nixon, ANP, AACC   01/12/2018 8:35 AM    [facility] [address], [place], [place] [address] Phone: [phone]; Fax: [phone]

## 2018-01-11 NOTE — Patient Instructions (Signed)
Order- please schedule unattended home sleep test  Dx OSA  Please call me about 2 weeks after your sleep study, for results and recommendations. If appropriate, we may be able to start treatment before we see you back.

## 2018-01-11 NOTE — Assessment & Plan Note (Signed)
He had obstructive sleep apnea in the past and benefited from CPAP but has lapsed therapy and now is aware of snoring and daytime sleepiness. Plan-education done advising normal weight, responsibility to drive safely.  Schedule sleep study anticipating we will resume CPAP if appropriate.

## 2018-01-12 ENCOUNTER — Ambulatory Visit: Payer: Managed Care, Other (non HMO) | Admitting: Adult Health

## 2018-01-12 ENCOUNTER — Encounter: Payer: Self-pay | Admitting: Adult Health

## 2018-01-12 VITALS — BP 138/84 | HR 49 | Ht 74.0 in | Wt 323.0 lb

## 2018-01-12 DIAGNOSIS — I10 Essential (primary) hypertension: Secondary | ICD-10-CM

## 2018-01-12 DIAGNOSIS — I4891 Unspecified atrial fibrillation: Secondary | ICD-10-CM

## 2018-01-12 DIAGNOSIS — G4733 Obstructive sleep apnea (adult) (pediatric): Secondary | ICD-10-CM

## 2018-01-12 DIAGNOSIS — Z953 Presence of xenogenic heart valve: Secondary | ICD-10-CM

## 2018-01-12 DIAGNOSIS — I5043 Acute on chronic combined systolic (congestive) and diastolic (congestive) heart failure: Secondary | ICD-10-CM | POA: Diagnosis not present

## 2018-01-12 MED ORDER — FUROSEMIDE 20 MG PO TABS: 20.0000 mg | ORAL_TABLET | Freq: Every day | ORAL | 3 refills | Status: DC

## 2018-01-12 NOTE — Patient Instructions (Signed)
Medication Instructions:  START LASIX 20MG  DAILY.  If you need a refill on your cardiac medications before your next appointment, please call your pharmacy.  Labwork: BMET IN ONE WEEK HERE IN OUR OFFICE AT [facility]  Take the provided lab slips with you to the lab for your blood draw.   You will NOT need to fast   Testing/Procedures: Echocardiogram - Your physician has requested that you have an echocardiogram. Echocardiography is a painless test that uses sound waves to create images of your heart. It provides your doctor with information about the size and shape of your heart and how well your heart's chambers and valves are working. This procedure takes approximately one hour. There are no restrictions for this procedure. [facility] - [address], [address].  Special Instructions: PLEASE HAVE YOUR WORK FAX RECENT LAB RESULTS-[phone]  Follow-Up: Your physician wants you to follow-up in: 4-6 WEEKS WITH DR Excell SeltzerOOPER.    Thank you for choosing [facility] at [facility]!!

## 2018-01-20 ENCOUNTER — Ambulatory Visit (HOSPITAL_COMMUNITY): Payer: Managed Care, Other (non HMO) | Attending: Cardiology

## 2018-01-20 ENCOUNTER — Other Ambulatory Visit: Payer: Self-pay

## 2018-01-20 DIAGNOSIS — I119 Hypertensive heart disease without heart failure: Secondary | ICD-10-CM | POA: Diagnosis not present

## 2018-01-20 DIAGNOSIS — R0600 Dyspnea, unspecified: Secondary | ICD-10-CM | POA: Insufficient documentation

## 2018-01-20 DIAGNOSIS — R079 Chest pain, unspecified: Secondary | ICD-10-CM | POA: Insufficient documentation

## 2018-01-20 DIAGNOSIS — I4891 Unspecified atrial fibrillation: Secondary | ICD-10-CM | POA: Insufficient documentation

## 2018-01-20 DIAGNOSIS — Z952 Presence of prosthetic heart valve: Secondary | ICD-10-CM | POA: Insufficient documentation

## 2018-01-20 DIAGNOSIS — I10 Essential (primary) hypertension: Secondary | ICD-10-CM | POA: Diagnosis present

## 2018-01-20 DIAGNOSIS — E785 Hyperlipidemia, unspecified: Secondary | ICD-10-CM | POA: Insufficient documentation

## 2018-01-21 ENCOUNTER — Telehealth: Payer: Self-pay | Admitting: Neurology

## 2018-01-21 NOTE — Telephone Encounter (Signed)
LVM informing patient that he has not been seen in this office since Oct 2017. Advised that he may have called the wrong office. Left number for any further questions.

## 2018-01-21 NOTE — Telephone Encounter (Signed)
Patient called and stated that he is need of paperwork allowing him to go back to work. He says that his work is requiring he come pick it up now. He says he spoke with someone earlier who was preparing the letter but I do not see any notes?

## 2018-01-22 ENCOUNTER — Telehealth: Payer: Self-pay | Admitting: Adult Health

## 2018-01-22 NOTE — Telephone Encounter (Signed)
New Message   Patient is calling to obtain echocardiogram results. Please call. Patient will be available around 4:15pm.

## 2018-01-22 NOTE — Telephone Encounter (Signed)
Patient was given results of echo.   Only question patient had was if he was suppose to be on Eliquis now?   I did not see any new RX's and not on current med list. Please advise, thanks!

## 2018-01-22 NOTE — Telephone Encounter (Signed)
Attempted to contact patient, LVM, will try again. Left call back number if he gets that message.

## 2018-01-24 NOTE — Telephone Encounter (Signed)
Yes, he should be on Eliquis 5 mg BID. He will need to have follow up BMET and CBC in one month please.

## 2018-01-25 NOTE — Telephone Encounter (Signed)
Called pt to give him Joni ReiningKathryn Lawrence, NP response.

## 2018-01-26 ENCOUNTER — Telehealth: Payer: Self-pay

## 2018-01-26 NOTE — Telephone Encounter (Signed)
Spoke with pt. Pt sts that he does not have a script for Eliquis and has been on Coumadin for 12 years. His recent INR's have been therapeutic. His INR check on 7/25 was 2.3.   He is on Coumadin for AVR and wants to remain on Coumadin.  Per Joni ReiningKathryn Lawrence, NP Notes recorded by Rogelia BogaLawrence, Kathryn,D-NP on 01/20/2018 at 3:43 PM EDT Normal heart pumping function with some relaxation stiffening. His left atrium is moderately dilated. Will discuss cardioversion on next office visit after he has been on Eliquis for one month. His AoV is functioning normally.  Adv pt that I will fwd the update [facility] for recommendation on f/u and cardioversion. We will call him back with her response.  Pt agreeable with plan and verbalized understanding

## 2018-01-26 NOTE — Telephone Encounter (Signed)
From prev. Note: [name], Lance S, RN 1 hour ago (12:14 PM) Spoke with pt. Pt sts that he does not have a script for Eliquis and has been on Coumadin for 12 years. His recent INR'[name] have been therapeutic. His INR check on 7/25 was 2.3.   He is on Coumadin for AVR and wants to remain on Coumadin.  Per Joni ReiningKathryn Lawrence, NP Notes recorded by Rogelia BogaLawrence, Kathryn,D-NP on 01/20/2018 at 3:43 PM EDT Normal heart pumping function with some relaxation stiffening. His left atrium is moderately dilated. Will discuss cardioversion on next office visit after he has been on Eliquis for one month. His AoV is functioning normally.  Adv pt that I will [facility]wd the [facility] for recommendation on f/u and cardioversion. We will call him back with her response.  Pt agreeable with plan and verbalized understanding  LM2CB

## 2018-01-26 NOTE — Telephone Encounter (Signed)
He can stay on coumadin if he chooses. Im not sure why I thought he was on Eliquis.

## 2018-01-27 NOTE — Telephone Encounter (Signed)
Will forward to Dr. Earmon Phoenixooper's nurse to see if patient can get a sooner appointment to discuss cardioversion.

## 2018-01-28 NOTE — Telephone Encounter (Signed)
Scheduled patient 9/18 with Dr. Excell Seltzerooper. Patient's wife agrees with date/time.

## 2018-02-01 DIAGNOSIS — G4733 Obstructive sleep apnea (adult) (pediatric): Secondary | ICD-10-CM | POA: Diagnosis not present

## 2018-02-02 ENCOUNTER — Other Ambulatory Visit: Payer: Self-pay | Admitting: *Deleted

## 2018-02-02 DIAGNOSIS — G4733 Obstructive sleep apnea (adult) (pediatric): Secondary | ICD-10-CM | POA: Diagnosis not present

## 2018-02-08 ENCOUNTER — Other Ambulatory Visit: Payer: Self-pay | Admitting: Cardiovascular Disease

## 2018-02-08 DIAGNOSIS — Z5181 Encounter for therapeutic drug level monitoring: Secondary | ICD-10-CM

## 2018-02-08 DIAGNOSIS — Z952 Presence of prosthetic heart valve: Secondary | ICD-10-CM

## 2018-02-08 DIAGNOSIS — I359 Nonrheumatic aortic valve disorder, unspecified: Secondary | ICD-10-CM

## 2018-02-08 DIAGNOSIS — Z7901 Long term (current) use of anticoagulants: Secondary | ICD-10-CM

## 2018-02-10 ENCOUNTER — Encounter: Payer: Self-pay | Admitting: Cardiovascular Disease

## 2018-02-17 ENCOUNTER — Telehealth: Payer: Self-pay | Admitting: Internal Medicine

## 2018-02-17 DIAGNOSIS — G4733 Obstructive sleep apnea (adult) (pediatric): Secondary | ICD-10-CM

## 2018-02-18 NOTE — Telephone Encounter (Signed)
LMOM TCB x1  Sleep studies typically take 2 weeks or so for the results Will route to CDY to advise (results are scanned into epic)  Dr Maple HudsonYoung please advise, thank you.

## 2018-02-18 NOTE — Telephone Encounter (Signed)
Patient returned call, CB today is [phone] and tomorrow may reach him at [phone]

## 2018-02-18 NOTE — Telephone Encounter (Signed)
Spoke with pt. He is aware of his results. Order has been placed for CPAP. OV has been scheduled. Nothing further was needed.

## 2018-02-18 NOTE — Telephone Encounter (Signed)
Attempted to call pt. I did not receive an answer. I have left a message for pt to return our call.  

## 2018-02-18 NOTE — Telephone Encounter (Signed)
Home sleep test showed severe obstructive sleep apnea, averaging 36 apneas/ hour, with drops in blood oxygen level.   I recommend order new DME, new CPAP auto 5-20, mask of choice, humidifier, supplies, airView  Dx OSA  Please make sure he has a return ov in 31-90 days, per insurance regs

## 2018-02-25 ENCOUNTER — Ambulatory Visit: Payer: Managed Care, Other (non HMO) | Admitting: Cardiovascular Disease

## 2018-02-25 ENCOUNTER — Encounter: Payer: Self-pay | Admitting: Cardiovascular Disease

## 2018-02-25 ENCOUNTER — Ambulatory Visit (INDEPENDENT_AMBULATORY_CARE_PROVIDER_SITE_OTHER): Payer: Managed Care, Other (non HMO) | Admitting: *Deleted

## 2018-02-25 VITALS — BP 148/72 | HR 54 | Ht 74.0 in | Wt 325.0 lb

## 2018-02-25 DIAGNOSIS — Z952 Presence of prosthetic heart valve: Secondary | ICD-10-CM | POA: Diagnosis not present

## 2018-02-25 DIAGNOSIS — Z5181 Encounter for therapeutic drug level monitoring: Secondary | ICD-10-CM

## 2018-02-25 DIAGNOSIS — I359 Nonrheumatic aortic valve disorder, unspecified: Secondary | ICD-10-CM | POA: Diagnosis not present

## 2018-02-25 DIAGNOSIS — I4891 Unspecified atrial fibrillation: Secondary | ICD-10-CM

## 2018-02-25 DIAGNOSIS — I5032 Chronic diastolic (congestive) heart failure: Secondary | ICD-10-CM

## 2018-02-25 DIAGNOSIS — Z7901 Long term (current) use of anticoagulants: Secondary | ICD-10-CM

## 2018-02-25 DIAGNOSIS — G4733 Obstructive sleep apnea (adult) (pediatric): Secondary | ICD-10-CM

## 2018-02-25 LAB — POCT INR: INR: 2.2 (ref 2.0–3.0)

## 2018-02-25 MED ORDER — FUROSEMIDE 20 MG PO TABS: 40.0000 mg | ORAL_TABLET | Freq: Every day | ORAL | 11 refills | Status: DC

## 2018-02-25 MED ORDER — POTASSIUM CHLORIDE ER 10 MEQ PO TBCR: 10.0000 meq | EXTENDED_RELEASE_TABLET | Freq: Every day | ORAL | 11 refills | Status: DC

## 2018-02-25 NOTE — Patient Instructions (Addendum)
Medication Instructions:  1) INCREASE LASIX to 40 mg daily 2) START KDUR 10 meq daily  Labwork: Your provider recommends that you return for lab work on March 29, 2018. You may come ANY TIME between 7:30AM and 5:00PM. You do NOT need to fast. Just make sure to check-in when you get off the elevator!  Testing/Procedures: None  Follow-Up: You have an appointment scheduled with Tereso NewcomerScott Weaver, PA on 08/27/2018 at 8:15AM.   Any Other Special Instructions Will Be Listed Below (If Applicable).     If you need a refill on your cardiac medications before your next appointment, please call your pharmacy.

## 2018-02-25 NOTE — Patient Instructions (Signed)
Continue on same dosage 1.5 tablets all days except 1 tablet on Fridays.  Recheck in 6 weeks.    Call us with any procedures or new medications [phone] & Fax number is [phone]

## 2018-02-25 NOTE — Progress Notes (Signed)
Cardiology Office Note:    Date:  02/27/2018   ID:  [name], DOB 04/09/1961, MRN [id number]  PCP:  Lorenda Ishihara, MD  Cardiologist:  Tonny Bollman, MD  Electrophysiologist:  None   Referring MD: Dorothyann Peng, MD   Chief Complaint  Patient presents with  . Edema   History of Present Illness:    [name] is a 57 y.o. male with a hx of mechanical aortic valve replacement and thoracic aneurysm repair with a mechanical valve conduit and reimplantation of the coronary arteries in 2006.  Other problems include hypertension, hyperlipidemia, severe obstructive sleep apnea, morbid obesity, and chronic leg edema with venous stasis.  He was recently diagnosed with atrial fibrillation and underwent an echocardiogram to assess LV function and left atrial size.  He presents today for follow-up evaluation.  The patient is chronically anticoagulated with warfarin in the setting of a mechanical aortic valve.  He complains of stable dyspnea with exertion and leg swelling.  He denies orthopnea or PND.  He has some chest pain but this only occurs with drinking cold fluids.  He has no exertional chest pain or pressure.  He recently had a home sleep study confirming severe obstructive sleep apnea.  The patient has not been compliant with CPAP over the years.  He complains of generalized fatigue.  Past Medical History:  Diagnosis Date  . Ascending aortic aneurysm and dissection    2006 repaired  7 cm aneurysm  . Chest pain, atypical   . Coronary artery reimplantation   . Hyperlipidemia    Mixed  . Hypertension    Unspecified  . Obesity   . S/P aortic valve and root replacement    HX of St. Jude  . Seizures (HCC)   . Sleep apnea, obstructive     Past Surgical History:  Procedure Laterality Date  . Ascending aortic dissection aneurysm  12/06/2004   ok for MRI 1.5 or 3T Using a 27-mm St. Jude mechanical valve conduit with reimplantation of both coronary arteries  .  CORNEAL TRANSPLANT     R   . KNEE SURGERY     R  . Replacement of aortic valve  12/06/2004    Current Medications: Current Meds  Medication Sig  . aspirin 81 MG tablet Take 81 mg by mouth daily.  . Cholecalciferol (VITAMIN D) 2000 units tablet Take 2,000 Units by mouth 2 (two) times daily.  . furosemide (LASIX) 20 MG tablet Take 2 tablets (40 mg total) by mouth daily.  . metoprolol succinate (TOPROL-XL) 50 MG 24 hr tablet Take 50 mg by mouth daily.   . Multiple Vitamins-Minerals (CENTRUM SILVER ADULT 50+) TABS 1 qd  . oxyCODONE-acetaminophen (PERCOCET/ROXICET) 5-325 MG tablet Take 1 tablet by mouth every 6 (six) hours as needed. AS NEEDED FOR PAIN  . pregabalin (LYRICA) 75 MG capsule Take 75 mg by mouth 2 (two) times daily.  . rosuvastatin (CRESTOR) 10 MG tablet Take 10 mg by mouth daily.   . vitamin C (ASCORBIC ACID) 500 MG tablet Take 500 mg by mouth daily.  Marland Kitchen warfarin (COUMADIN) 5 MG tablet TAKE AS DIRECTED BY COUMADIN CLINIC  . [DISCONTINUED] furosemide (LASIX) 20 MG tablet Take 1 tablet (20 mg total) by mouth daily.     Allergies:   Nsaids   Social History   Socioeconomic History  . Marital status: Married    Spouse name: Not on file  . Number of children: 0  . Years of education: 61  . Highest  education level: Not on file  Occupational History  . Occupation: MIXER/PACKER    Employer: [employer]    Comment: works at [employer]- inhales vapors  Social Needs  . Financial resource strain: Not on file  . Food insecurity:    Worry: Not on file    Inability: Not on file  . Transportation needs:    Medical: Not on file    Non-medical: Not on file  Tobacco Use  . Smoking status: Former Smoker    Packs/day: 0.10    Years: 18.00    Pack years: 1.80    Types: Cigarettes    Last attempt to quit: 12/07/1989    Years since quitting: 28.2  . Smokeless tobacco: Former Engineer, water and Sexual Activity  . Alcohol use: No    Comment: Quit 1991  . Drug use: No   . Sexual activity: Not on file  Lifestyle  . Physical activity:    Days per week: Not on file    Minutes per session: Not on file  . Stress: Not on file  Relationships  . Social connections:    Talks on phone: Not on file    Gets together: Not on file    Attends religious service: Not on file    Active member of club or organization: Not on file    Attends meetings of clubs or organizations: Not on file    Relationship status: Not on file  Other Topics Concern  . Not on file  Social History Narrative   Married   No regular exercise      Patient drinks 2 cups of coffee a day    Patient is right handed.      Family History: The patient's family history includes Cervical cancer in his mother and sister; Coronary artery disease in his mother; Diabetes in his father and mother.  ROS:   Please see the history of present illness.    All other systems reviewed and are negative.  EKGs/Labs/Other Studies Reviewed:    The following studies were reviewed today: Echo 01-20-2018: Study Conclusions  - HPI and indications: Aortic valve replacement (27mm) St. Jude   mechanical valve. - Left ventricle: The cavity size was normal. There was severe   concentric hypertrophy. Systolic function was normal. The   estimated ejection fraction was in the range of 60% to 65%. Wall   motion was normal; there were no regional wall motion   abnormalities. Doppler parameters are consistent with abnormal   left ventricular relaxation (grade 1 diastolic dysfunction). - Aortic valve: A mechanical prosthesis was present and functioning   normally. Peak velocity (S): 331 cm/s. Mean gradient (S): 21 mm   Hg. Valve area (VTI): 1.26 cm^2. Valve area (Vmax): 1.08 cm^2.   Valve area (Vmean): 1.05 cm^2. - Left atrium: The atrium was moderately dilated. - Right atrium: The atrium was severely dilated. - Tricuspid valve: There was trivial regurgitation. - Pulmonary arteries: Systolic pressure was mildly  increased. PA   peak pressure: 32 mm Hg (S).  EKG:  EKG is ordered today. This demonstrates sinus bradycardia.  Recent Labs: No results found for requested labs within last 8760 hours.  Recent Lipid Panel No results found for: CHOL, TRIG, HDL, CHOLHDL, VLDL, LDLCALC, LDLDIRECT  Physical Exam:    VS:  BP (!) 148/72   Pulse (!) 54   Ht 6\' 2"  (1.88 m)   Wt (!) 325 lb (147.4 kg)   SpO2 95%   BMI  41.73 kg/m     Wt Readings from Last 3 Encounters:  02/25/18 (!) 325 lb (147.4 kg)  01/12/18 (!) 323 lb (146.5 kg)  01/11/18 (!) 324 lb 3.2 oz (147.1 kg)     GEN:  Morbidly obese male in no acute distress HEENT: Normal NECK: No JVD; No carotid bruits LYMPHATICS: No lymphadenopathy CARDIAC: RRR, 2/6 systolic ejection murmur at the left upper sternal border, crisp mechanical A2, occasional premature beats RESPIRATORY:  Clear to auscultation without rales, wheezing or rhonchi  ABDOMEN: Soft, non-tender, non-distended MUSCULOSKELETAL: 2+ pretibial edema bilaterally with chronic stasis changes; No deformity  SKIN: Warm and dry NEUROLOGIC:  Alert and oriented x 3 PSYCHIATRIC:  Normal affect   ASSESSMENT:    1. Aortic valve disorder   2. New onset atrial fibrillation (HCC)   3. Chronic diastolic heart failure (HCC)   4. SLEEP APNEA, OBSTRUCTIVE      PLAN:    In order of problems listed above:  1. Patient's mechanical aortic valve prosthesis is functioning normally on his recent echocardiogram.  He remains in the clinic warfarin.  He also takes aspirin 81 mg per guidelines.  He understands the need for SBE prophylaxis. 2. The patient is back in normal sinus rhythm today.  Again we discussed the impact of weight loss and treatment of sleep apnea on his risk of developing persistent atrial fibrillation. 3. The patient continues on furosemide 20 mg daily, will increase to 40 mg as his edema appears worse today.  He is counseled regarding diet, weight loss, and lifestyle modification 4. I  stressed with him the importance of CPAP compliance.  He understands the need for weight loss.  He understands the implications of this asthma management of atrial fibrillation and general cardiovascular outcomes.    Medication Adjustments/Labs and Tests Ordered: Current medicines are reviewed at length with the patient today.  Concerns regarding medicines are outlined above.  Orders Placed This Encounter  Procedures  . Basic metabolic panel   Meds ordered this encounter  Medications  . furosemide (LASIX) 20 MG tablet    Sig: Take 2 tablets (40 mg total) by mouth daily.    Dispense:  60 tablet    Refill:  11  . potassium chloride (K-DUR) 10 MEQ tablet    Sig: Take 1 tablet (10 mEq total) by mouth daily.    Dispense:  30 tablet    Refill:  11    Patient Instructions  Medication Instructions:  1) INCREASE LASIX to 40 mg daily 2) START KDUR 10 meq daily  Labwork: Your provider recommends that you return for lab work on March 29, 2018. You may come ANY TIME between 7:30AM and 5:00PM. You do NOT need to fast. Just make sure to check-in when you get off the elevator!  Testing/Procedures: None  Follow-Up: You have an appointment scheduled with Tereso NewcomerScott Weaver, PA on 08/27/2018 at 8:15AM.   Any Other Special Instructions Will Be Listed Below (If Applicable).     If you need a refill on your cardiac medications before your next appointment, please call your pharmacy.      Signed, Tonny BollmanMichael Eulice Rutledge, MD  02/27/2018 8:25 AM    [facility]

## 2018-03-01 NOTE — Addendum Note (Signed)
Addended by: Gunnar FusiKEMP, Jetaun Colbath A on: 03/01/2018 07:45 AM   Modules accepted: Orders

## 2018-03-08 ENCOUNTER — Telehealth: Payer: Self-pay

## 2018-03-08 NOTE — Telephone Encounter (Signed)
Pt ok to proceed with myelogram. I agree with anticoagulation recommendations as outlined by Margaretmary Dys. thx

## 2018-03-08 NOTE — Telephone Encounter (Signed)
   [facility] Pre-operative Risk Assessment    Request for surgical clearance:  1. What type of surgery is being performed?  Lumbar Myelogram/CT   2. When is this surgery scheduled? TBD   3. What type of clearance is required (medical clearance vs. Pharmacy clearance to hold med vs. Both)? Both  4. Are there any medications that need to be held prior to surgery and how long? Warfarin   5. Practice name and name of physician performing surgery?  [facility]   6. What is your office phone number [phone]    7.   What is your office fax number [phone] Attn Janett Billow  8.   Anesthesia type (None, local, MAC, general) ? Unknown   [name] 03/08/2018, 1:02 PM  _________________________________________________________________   (provider comments below)

## 2018-03-08 NOTE — Telephone Encounter (Addendum)
Pt takes warfarin for mechanical AVR with current INR range 2-3. He was recently diagnosed with afib and therefore will need INR range increased to 2.5-3.5 as well as Lovenox bridge for upcoming procedure in order to hold warfarin for 5 days prior to procedure. Will coordinate Lovenox bridge in clinic with patient once procedure is scheduled.  LMOM for pt to let us know when procedure is scheduled - he will likely need next INR check rescheduled as he is not due to come to clinic for recheck for another month.

## 2018-03-08 NOTE — Telephone Encounter (Signed)
   Primary Cardiologist: Tonny Bollman, MD  Chart reviewed as part of pre-operative protocol coverage. Dr. Excell Seltzer, can you provider clearance as you have seen the patient 2 weeks ago. Will ask pharmacy to review anticoagulation.    [place], [place] 03/08/2018, 2:49 PM

## 2018-03-16 ENCOUNTER — Telehealth: Payer: Self-pay | Admitting: Cardiovascular Disease

## 2018-03-16 NOTE — Telephone Encounter (Signed)
Tried to call patient, rang busy.

## 2018-03-16 NOTE — Telephone Encounter (Signed)
New Message:     Patient is questioning if you should have an MRI or CT Scan

## 2018-03-19 NOTE — Telephone Encounter (Signed)
Dr. Excell Seltzer I will defer to you on this.

## 2018-03-19 NOTE — Telephone Encounter (Signed)
[name] states [facility] requested he ask Cardiology if his valve is compatible for MRI machine. He states how they proceed with further testing depends on compatibility.

## 2018-03-23 ENCOUNTER — Encounter: Payer: Self-pay | Admitting: Internal Medicine

## 2018-03-23 ENCOUNTER — Ambulatory Visit: Payer: Managed Care, Other (non HMO) | Admitting: Internal Medicine

## 2018-03-23 VITALS — BP 142/80 | HR 58 | Ht 74.0 in | Wt 320.0 lb

## 2018-03-23 DIAGNOSIS — Z23 Encounter for immunization: Secondary | ICD-10-CM | POA: Diagnosis not present

## 2018-03-23 DIAGNOSIS — G4733 Obstructive sleep apnea (adult) (pediatric): Secondary | ICD-10-CM

## 2018-03-23 NOTE — Assessment & Plan Note (Signed)
He is benefiting from CPAP with improved sleep.  We reviewed compliance goals and comfort issues.  A chinstrap may allow him to be comfortable with his nasal pillows but otherwise he needs to change to a full facemask to allow for mouth breathing without excessive dryness.  I told him how to look up instructions for adjusting his humidifier. Plan-continue CPAP auto 5-20

## 2018-03-23 NOTE — Progress Notes (Signed)
01/11/2018-57 year old male former smoker for sleep evaluation. Sleep Consult: Pt had sleep study about 10 years ago-had CPAP in past but has not worn it in about 1.5 years.  Epworth score 16 Body weight today 324 pounds Medical problem list includes HBP, Aortic Valve Replacement, GERD, obesity, restrictive lung disease, He remembers being better off with CPAP but then he lost weight and hoped he did not need it.  He has gained weight.  Snoring loudly.  Daytime sleepiness.  No sleep medicines.  Moderate caffeine. No ENT surgery or lung disease.  History of traumatic subdural hematoma with question of seizure at that time but no seizure since.  He denies parasomnias.  03/23/2018-57 year old male former smoker followed for OSA, complicated by HBP, aortic valve replacement, GERD, obesity, restrictive lung disease HST-02/01/2018-AHI 36.7/hour, desaturation to 63%, body weight 324 pounds CPAP auto 5-20/[facility] new 02/18/2018 Body weight today 320 pounds -----OSA: DME [facility]. Pt states he is wearing CPAP mostly but feels his needs a different mask-has nasal pillows. DL attached.  Download began recording on the 20th of the month so percentages are not correct.  He was having a lot of short nights but is gradually improving.  Definitely less tired.  Nasal pillows mask is allowing his mouth to dry out.  We discussed humidifier and alternative mask styles.  ROS-see HPI   + = positive Constitutional:    weight loss, night sweats, fevers, chills, fatigue, lassitude. HEENT:    +headaches, +difficulty swallowing, +tooth/dental problems, sore throat,       sneezing, itching, ear ache, nasal congestion, post nasal drip, snoring CV:    chest pain, orthopnea, PND, swelling in lower extremities, anasarca,                                                  dizziness, palpitations Resp:   shortness of breath with exertion or at rest.                productive cough,   non-productive cough, coughing up of blood.     change in color of mucus.  wheezing.   Skin:    rash or lesions. GI:  No-   heartburn, indigestion, abdominal pain, nausea, vomiting, diarrhea,                 change in bowel habits, loss of appetite GU: dysuria, change in color of urine, no urgency or frequency.   flank pain. MS:   +joint pain, stiffness, decreased range of motion, back pain. Neuro-     nothing unusual Psych:  change in mood or affect.  depression or anxiety.   memory loss.  OBJ- Physical Exam General- Alert, Oriented, Affect-appropriate, Distress- none acute, + obese Skin- rash-none, lesions- none, excoriation- none Lymphadenopathy- none Head- atraumatic            Eyes- Gross vision intact, PERRLA, +strabismus,  conjunctivae and secretions clear            Ears- Hearing, canals-normal            Nose- Clear, no-Septal dev, mucus, polyps, erosion, perforation             Throat- Mallampati II-III , mucosa clear , drainage- none, tonsils- atrophic Neck- flexible , trachea midline, no stridor , thyroid nl, carotid no bruit Chest - symmetrical excursion , unlabored  Heart/CV- RRR , no murmur , no gallop  , no rub, nl s1 s2                           - JVD- none , edema- none, stasis changes- none, varices- none           Lung- clear to P&A, wheeze- none, cough- none , dullness-none, rub- none           Chest wall-  Abd-  Br/ Gen/ Rectal- Not done, not indicated Extrem- cyanosis- none, clubbing, none, atrophy- none, strength- nl Neuro- grossly intact to observation

## 2018-03-23 NOTE — Telephone Encounter (Signed)
MRI is ok as long as a 1.5 or 3T scanner is used. thanks

## 2018-03-23 NOTE — Assessment & Plan Note (Signed)
Long-term weight loss is encouraged, recognizing its impact on his OSA and other problems. Plan-flu vaccine-standard

## 2018-03-23 NOTE — Patient Instructions (Signed)
Order- DME [facility]- please refit mask- try full face to reduce dry mouth. Continue auto 5-20, mask of choice, humidifier, supplies, AirFlow.   Please also provide chin strap to reduce mouth breathing.  Order- flu vax- standard  Your CPAP booklet will have instructions for adjusting your humidifier  Please cal if we can help

## 2018-03-24 NOTE — Telephone Encounter (Signed)
Faxed to [facility]. fax number# [phone] Attn Shanda Bumps

## 2018-03-24 NOTE — Telephone Encounter (Signed)
Clearance question already addressed.  Will remove from preop pool. Tereso Newcomer, PA-C    03/24/2018 4:06 PM

## 2018-03-24 NOTE — Telephone Encounter (Signed)
See Dr. Earmon Phoenix note

## 2018-03-29 ENCOUNTER — Other Ambulatory Visit: Payer: Managed Care, Other (non HMO) | Admitting: *Deleted

## 2018-03-29 DIAGNOSIS — I5032 Chronic diastolic (congestive) heart failure: Secondary | ICD-10-CM

## 2018-03-30 LAB — BASIC METABOLIC PANEL
BUN/Creatinine Ratio: 18 (ref 9–20)
BUN: 15 mg/dL (ref 6–24)
CO2: 25 mmol/L (ref 20–29)
Calcium: 9.5 mg/dL (ref 8.7–10.2)
Chloride: 103 mmol/L (ref 96–106)
Creatinine, Ser: 0.82 mg/dL (ref 0.76–1.27)
GFR calc Af Amer: 112 mL/min/{1.73_m2} (ref >59)
GFR, EST NON AFRICAN AMERICAN: 97 mL/min/{1.73_m2} (ref >59)
Glucose: 123 mg/dL — ABNORMAL HIGH (ref 65–99)
POTASSIUM: 3.9 mmol/L (ref 3.5–5.2)
SODIUM: 145 mmol/L — AB (ref 134–144)

## 2018-04-13 ENCOUNTER — Ambulatory Visit: Payer: Self-pay | Admitting: Internal Medicine

## 2018-04-26 ENCOUNTER — Ambulatory Visit: Payer: Self-pay | Admitting: Cardiovascular Disease

## 2018-05-10 ENCOUNTER — Ambulatory Visit: Payer: Self-pay | Admitting: Adult Health

## 2018-05-20 ENCOUNTER — Encounter (HOSPITAL_COMMUNITY): Payer: Self-pay

## 2018-05-20 ENCOUNTER — Emergency Department (HOSPITAL_COMMUNITY): Payer: No Typology Code available for payment source

## 2018-05-20 ENCOUNTER — Ambulatory Visit (INDEPENDENT_AMBULATORY_CARE_PROVIDER_SITE_OTHER): Payer: No Typology Code available for payment source | Admitting: *Deleted

## 2018-05-20 ENCOUNTER — Emergency Department (HOSPITAL_COMMUNITY)
Admission: EM | Admit: 2018-05-20 | Discharge: 2018-05-20 | Disposition: A | Discharge status: Home / Self Care | Payer: No Typology Code available for payment source | Attending: Emergency Medicine | Admitting: Emergency Medicine

## 2018-05-20 DIAGNOSIS — S39012A Strain of muscle, fascia and tendon of lower back, initial encounter: Secondary | ICD-10-CM | POA: Diagnosis not present

## 2018-05-20 DIAGNOSIS — Z87891 Personal history of nicotine dependence: Secondary | ICD-10-CM | POA: Diagnosis not present

## 2018-05-20 DIAGNOSIS — R51 Headache: Secondary | ICD-10-CM | POA: Diagnosis not present

## 2018-05-20 DIAGNOSIS — Y999 Unspecified external cause status: Secondary | ICD-10-CM | POA: Diagnosis not present

## 2018-05-20 DIAGNOSIS — Z79899 Other long term (current) drug therapy: Secondary | ICD-10-CM | POA: Diagnosis not present

## 2018-05-20 DIAGNOSIS — Z5181 Encounter for therapeutic drug level monitoring: Secondary | ICD-10-CM | POA: Diagnosis not present

## 2018-05-20 DIAGNOSIS — Y92410 Unspecified street and highway as the place of occurrence of the external cause: Secondary | ICD-10-CM | POA: Diagnosis not present

## 2018-05-20 DIAGNOSIS — S161XXA Strain of muscle, fascia and tendon at neck level, initial encounter: Secondary | ICD-10-CM | POA: Diagnosis not present

## 2018-05-20 DIAGNOSIS — I359 Nonrheumatic aortic valve disorder, unspecified: Secondary | ICD-10-CM | POA: Diagnosis not present

## 2018-05-20 DIAGNOSIS — I119 Hypertensive heart disease without heart failure: Secondary | ICD-10-CM | POA: Insufficient documentation

## 2018-05-20 DIAGNOSIS — Z952 Presence of prosthetic heart valve: Secondary | ICD-10-CM | POA: Diagnosis not present

## 2018-05-20 DIAGNOSIS — Z7982 Long term (current) use of aspirin: Secondary | ICD-10-CM | POA: Diagnosis not present

## 2018-05-20 DIAGNOSIS — S199XXA Unspecified injury of neck, initial encounter: Secondary | ICD-10-CM | POA: Diagnosis present

## 2018-05-20 DIAGNOSIS — Z7901 Long term (current) use of anticoagulants: Secondary | ICD-10-CM | POA: Diagnosis not present

## 2018-05-20 DIAGNOSIS — Y9389 Activity, other specified: Secondary | ICD-10-CM | POA: Diagnosis not present

## 2018-05-20 LAB — POCT INR: INR: 2.3 (ref 2.0–3.0)

## 2018-05-20 MED ORDER — METHOCARBAMOL 500 MG PO TABS: 500.0000 mg | ORAL_TABLET | Freq: Two times a day (BID) | ORAL | 0 refills | Status: DC

## 2018-05-20 MED ORDER — ACETAMINOPHEN 325 MG PO TABS: 650.0000 mg | ORAL_TABLET | Freq: Four times a day (QID) | ORAL | 0 refills | Status: DC | PRN
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
## 2018-05-20 NOTE — ED Notes (Signed)
Pt stable, ambulatory, states understanding of discharge instructions 

## 2018-05-20 NOTE — ED Notes (Signed)
Patient transported to X-ray 

## 2018-05-20 NOTE — Patient Instructions (Addendum)
Description   Since you goal range has changed please start taking 1.5 tablets.  Recheck in 4 weeks.    Call us with any procedures or new medications [phone] & Fax number is [phone]
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
## 2018-05-30 ENCOUNTER — Emergency Department (HOSPITAL_COMMUNITY): Payer: No Typology Code available for payment source

## 2018-05-30 ENCOUNTER — Other Ambulatory Visit: Payer: Self-pay

## 2018-05-30 ENCOUNTER — Encounter (HOSPITAL_COMMUNITY): Payer: Self-pay

## 2018-05-30 ENCOUNTER — Emergency Department (HOSPITAL_COMMUNITY)
Admission: EM | Admit: 2018-05-30 | Discharge: 2018-05-30 | Disposition: A | Discharge status: Home / Self Care | Payer: No Typology Code available for payment source | Attending: Emergency Medicine | Admitting: Emergency Medicine

## 2018-05-30 DIAGNOSIS — S0990XA Unspecified injury of head, initial encounter: Secondary | ICD-10-CM | POA: Diagnosis not present

## 2018-05-30 DIAGNOSIS — Z7982 Long term (current) use of aspirin: Secondary | ICD-10-CM | POA: Diagnosis not present

## 2018-05-30 DIAGNOSIS — E782 Mixed hyperlipidemia: Secondary | ICD-10-CM | POA: Insufficient documentation

## 2018-05-30 DIAGNOSIS — Y92410 Unspecified street and highway as the place of occurrence of the external cause: Secondary | ICD-10-CM | POA: Diagnosis not present

## 2018-05-30 DIAGNOSIS — Z7901 Long term (current) use of anticoagulants: Secondary | ICD-10-CM | POA: Diagnosis not present

## 2018-05-30 DIAGNOSIS — R791 Abnormal coagulation profile: Secondary | ICD-10-CM

## 2018-05-30 DIAGNOSIS — Z87891 Personal history of nicotine dependence: Secondary | ICD-10-CM | POA: Diagnosis not present

## 2018-05-30 DIAGNOSIS — Y939 Activity, unspecified: Secondary | ICD-10-CM | POA: Insufficient documentation

## 2018-05-30 DIAGNOSIS — S39012A Strain of muscle, fascia and tendon of lower back, initial encounter: Secondary | ICD-10-CM | POA: Diagnosis not present

## 2018-05-30 DIAGNOSIS — Y998 Other external cause status: Secondary | ICD-10-CM | POA: Insufficient documentation

## 2018-05-30 DIAGNOSIS — Z79899 Other long term (current) drug therapy: Secondary | ICD-10-CM | POA: Insufficient documentation

## 2018-05-30 DIAGNOSIS — M5416 Radiculopathy, lumbar region: Secondary | ICD-10-CM | POA: Insufficient documentation

## 2018-05-30 DIAGNOSIS — R42 Dizziness and giddiness: Secondary | ICD-10-CM | POA: Insufficient documentation

## 2018-05-30 DIAGNOSIS — I10 Essential (primary) hypertension: Secondary | ICD-10-CM | POA: Diagnosis not present

## 2018-05-30 DIAGNOSIS — S3992XA Unspecified injury of lower back, initial encounter: Secondary | ICD-10-CM | POA: Diagnosis present

## 2018-05-30 LAB — CBC WITH DIFFERENTIAL/PLATELET
Abs Immature Granulocytes: 0.02 10*3/uL (ref 0.00–0.07)
Basophils Absolute: 0.1 10*3/uL (ref 0.0–0.1)
Basophils Relative: 1 %
Eosinophils Absolute: 0.1 10*3/uL (ref 0.0–0.5)
Eosinophils Relative: 2 %
HCT: 40.8 % (ref 39.0–52.0)
Hemoglobin: 12.2 g/dL — ABNORMAL LOW (ref 13.0–17.0)
Immature Granulocytes: 0 %
Lymphocytes Relative: 25 %
Lymphs Abs: 1.3 10*3/uL (ref 0.7–4.0)
MCH: 28.4 pg (ref 26.0–34.0)
MCHC: 29.9 g/dL — ABNORMAL LOW (ref 30.0–36.0)
MCV: 95.1 fL (ref 80.0–100.0)
Monocytes Absolute: 0.6 10*3/uL (ref 0.1–1.0)
Monocytes Relative: 12 %
Neutro Abs: 3.3 10*3/uL (ref 1.7–7.7)
Neutrophils Relative %: 60 %
Platelets: 178 10*3/uL (ref 150–400)
RBC: 4.29 MIL/uL (ref 4.22–5.81)
RDW: 13.8 % (ref 11.5–15.5)
WBC: 5.4 10*3/uL (ref 4.0–10.5)
nRBC: 0 % (ref 0.0–0.2)

## 2018-05-30 LAB — PROTIME-INR
INR: 1.68
Prothrombin Time: 19.5 seconds — ABNORMAL HIGH (ref 11.4–15.2)

## 2018-05-30 LAB — BASIC METABOLIC PANEL
Anion gap: 8 (ref 5–15)
BUN: 12 mg/dL (ref 6–20)
CO2: 29 mmol/L (ref 22–32)
Calcium: 8.9 mg/dL (ref 8.9–10.3)
Chloride: 105 mmol/L (ref 98–111)
Creatinine, Ser: 0.9 mg/dL (ref 0.61–1.24)
GFR calc Af Amer: >60 mL/min (ref >60)
GFR calc non Af Amer: >60 mL/min (ref >60)
Glucose, Bld: 102 mg/dL — ABNORMAL HIGH (ref 70–99)
Potassium: 4 mmol/L (ref 3.5–5.1)
Sodium: 142 mmol/L (ref 135–145)

## 2018-05-30 MED ORDER — IOHEXOL 300 MG/ML  SOLN
100.0000 mL | Freq: Once | INTRAMUSCULAR | Status: AC | PRN
  Administered 2018-05-30: 100 mL via INTRAVENOUS

## 2018-05-30 MED ORDER — LIDOCAINE 5 % EX PTCH
1.0000 | MEDICATED_PATCH | CUTANEOUS | Status: DC
  Administered 2018-05-30: 1 via TRANSDERMAL
  Filled 2018-05-30: qty 1

## 2018-05-30 MED ORDER — ENOXAPARIN SODIUM 150 MG/ML ~~LOC~~ SOLN: 140.0000 mg | Freq: Two times a day (BID) | SUBCUTANEOUS | Status: DC

## 2018-05-30 MED ORDER — ENOXAPARIN SODIUM 150 MG/ML ~~LOC~~ SOLN
140.0000 mg | Freq: Once | SUBCUTANEOUS | Status: AC
  Administered 2018-05-30: 140 mg via SUBCUTANEOUS
  Filled 2018-05-30: qty 0.93

## 2018-05-30 MED ORDER — LIDOCAINE 5 % EX PTCH: 1.0000 | MEDICATED_PATCH | CUTANEOUS | 0 refills | Status: DC

## 2018-05-30 NOTE — Discharge Instructions (Addendum)
Please see the information and instructions below regarding your visit.  Your diagnoses today include:  1. Strain of lumbar region, initial encounter   2. Lumbar radiculopathy   3. Subtherapeutic international normalized ratio (INR)    About diagnosis. Most episodes of acute low back pain are self-limited. Your exam was reassuring today that the source of your pain is not affecting the spinal cord and nerves that originate in the spinal cord.   If you have a history of disc herniation or arthritis in your spine, the nerves exiting the spine on one side get inflamed. This can cause severe pain. We call this radiculopathy. We do not always know what causes the sudden inflammation.  Tests performed today include: See side panel of your discharge paperwork for testing performed today. Vital signs are listed at the bottom of these instructions.   Medications prescribed:    Take any prescribed medications only as prescribed, and any over the counter medications only as directed on the packaging.  It is very important you follow these instructions closely for your INR.  You will take 12.5 mg of warfarin tonight.  This means you will take 2.5 of your warfarin pills tonight.  You will resume the 7.5 mg nightly starting tomorrow.  Please call your cardiologist tomorrow to schedule your next appointment.  He will likely need to be seen by the end of the week.  Home care instructions:   Low back pain gets worse the longer you stay stationary. Please keep moving and walking as tolerated. There are exercises included in this packet to perform as tolerated for your low back pain.   Apply heat to the areas that are painful. Avoid twisting or bending your trunk to lift something. Do not lift anything above 25 lbs while recovering from this flare of low back pain.  Please follow any educational materials contained in this packet.   Follow-up instructions: Please follow-up with your primary care provider  in one week for further evaluation of your symptoms if they are not completely improved.   Please follow up with Dr. Everardo PacificVarkey of orthopedics as soon as possible.   You are prescribed physical therapy.  They should be calling you regarding [facility].  Return instructions:  Please return to the Emergency Department if you experience worsening symptoms.  Please return for any fever or chills in the setting of your back pain, weakness in the muscles of the legs, numbness in your legs and feet that is new or changing, numbness in the area where you wipe, retention of your urine, loss of bowel or bladder control, or problems with walking. Please return if you have any other emergent concerns.  Additional Information:   Your vital signs today were: BP (!) 152/80 (BP Location: Right Arm)    Pulse (!) 51    Temp 98.1 F (36.7 C)    Resp 17    Ht 6\' 2"  (1.88 m)    Wt (!) 142.9 kg    SpO2 99%    BMI 40.44 kg/m  If your blood pressure (BP) was elevated on multiple readings during this visit above 130 for the top number or above 80 for the bottom number, please have this repeated by your primary care provider within one month. --------------  Thank you for allowing us to participate in your care today.
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
## 2018-06-17 ENCOUNTER — Ambulatory Visit (INDEPENDENT_AMBULATORY_CARE_PROVIDER_SITE_OTHER): Payer: No Typology Code available for payment source | Admitting: *Deleted

## 2018-06-17 DIAGNOSIS — Z7901 Long term (current) use of anticoagulants: Secondary | ICD-10-CM | POA: Diagnosis not present

## 2018-06-17 DIAGNOSIS — Z5181 Encounter for therapeutic drug level monitoring: Secondary | ICD-10-CM | POA: Diagnosis not present

## 2018-06-17 DIAGNOSIS — Z952 Presence of prosthetic heart valve: Secondary | ICD-10-CM

## 2018-06-17 DIAGNOSIS — I359 Nonrheumatic aortic valve disorder, unspecified: Secondary | ICD-10-CM

## 2018-06-17 LAB — POCT INR: INR: 2.9 (ref 2.0–3.0)

## 2018-06-17 NOTE — Patient Instructions (Signed)
Description   Continue taking 1.5 tablets.  Recheck in 6 weeks. Call us with any procedures or new medications [phone] & Fax number is [phone]

## 2018-06-21 ENCOUNTER — Telehealth: Payer: Self-pay

## 2018-06-21 NOTE — Telephone Encounter (Signed)
   [facility] Pre-operative Risk Assessment    Request for surgical clearance:  1. What type of surgery is being performed? Epidural injection   2. When is this surgery scheduled? TBD   3. What type of clearance is required (medical clearance vs. Pharmacy clearance to hold med vs. Both)? Pharmacy  4. Are there any medications that need to be held prior to surgery and how long? Coumadin 7 days prior to epidural injection   5. Practice name and name of physician performing surgery? [facility]/Dr. Lake Bells Ibazebo   6. What is your office phone number [phone]    7.   What is your office fax number [phone]; ATTN: x-ray  8.   Anesthesia type (None, local, MAC, general) ? None   Mady Haagensen 06/21/2018, 3:41 PM  _________________________________________________________________   (provider comments below)

## 2018-06-22 ENCOUNTER — Other Ambulatory Visit: Payer: Self-pay | Admitting: Internal Medicine

## 2018-06-22 ENCOUNTER — Ambulatory Visit
Admission: RE | Admit: 2018-06-22 | Discharge: 2018-06-22 | Disposition: A | Discharge status: Home / Self Care | Payer: No Typology Code available for payment source | Source: Ambulatory Visit | Attending: Internal Medicine | Admitting: Internal Medicine

## 2018-06-22 DIAGNOSIS — J40 Bronchitis, not specified as acute or chronic: Secondary | ICD-10-CM

## 2018-06-23 NOTE — Telephone Encounter (Addendum)
Pt takes warfarin for mechanical AVR with history of afib. He will require bridging with Lovenox in order to hold his warfarin. Recommend holding warfarin for 5 days rather than 7 as this is sufficient for warfarin washout. Will coordinate Lovenox bridge in clinic with pt.  Next appt in Coumadin clinic is not until 2/20 [facility] for pt so that he can contact us when his procedure is scheduled. We will need to see him in clinic 1 week before his procedure to coordinate his Lovenox bridge.

## 2018-07-28 ENCOUNTER — Telehealth: Payer: Self-pay | Admitting: Internal Medicine

## 2018-07-28 ENCOUNTER — Ambulatory Visit: Payer: Self-pay | Admitting: Internal Medicine

## 2018-07-28 ENCOUNTER — Ambulatory Visit (INDEPENDENT_AMBULATORY_CARE_PROVIDER_SITE_OTHER): Payer: No Typology Code available for payment source | Admitting: *Deleted

## 2018-07-28 ENCOUNTER — Encounter (INDEPENDENT_AMBULATORY_CARE_PROVIDER_SITE_OTHER): Payer: Self-pay

## 2018-07-28 DIAGNOSIS — Z7901 Long term (current) use of anticoagulants: Secondary | ICD-10-CM | POA: Diagnosis not present

## 2018-07-28 DIAGNOSIS — Z952 Presence of prosthetic heart valve: Secondary | ICD-10-CM

## 2018-07-28 DIAGNOSIS — Z5181 Encounter for therapeutic drug level monitoring: Secondary | ICD-10-CM

## 2018-07-28 DIAGNOSIS — I359 Nonrheumatic aortic valve disorder, unspecified: Secondary | ICD-10-CM | POA: Diagnosis not present

## 2018-07-28 LAB — POCT INR: INR: 2.4 (ref 2.0–3.0)

## 2018-07-28 NOTE — Patient Instructions (Signed)
Description   Today take 2 tablets then continue taking 1.5 tablets.  Recheck in 6 weeks. Call us with any procedures or new medications [phone] & Fax number is [phone]

## 2018-07-28 NOTE — Telephone Encounter (Signed)
Patient is unable make apt tomorrow he will call back to reschedule.

## 2018-07-29 ENCOUNTER — Ambulatory Visit: Payer: Self-pay | Admitting: Internal Medicine

## 2018-08-10 ENCOUNTER — Other Ambulatory Visit: Payer: Self-pay

## 2018-08-10 ENCOUNTER — Emergency Department (HOSPITAL_COMMUNITY)
Admission: EM | Admit: 2018-08-10 | Discharge: 2018-08-10 | Disposition: A | Discharge status: Home / Self Care | Payer: No Typology Code available for payment source | Attending: Emergency Medicine | Admitting: Emergency Medicine

## 2018-08-10 ENCOUNTER — Encounter (HOSPITAL_COMMUNITY): Payer: Self-pay | Admitting: Emergency Medicine

## 2018-08-10 DIAGNOSIS — I83899 Varicose veins of unspecified lower extremities with other complications: Secondary | ICD-10-CM

## 2018-08-10 DIAGNOSIS — I83891 Varicose veins of right lower extremities with other complications: Secondary | ICD-10-CM | POA: Insufficient documentation

## 2018-08-10 DIAGNOSIS — E782 Mixed hyperlipidemia: Secondary | ICD-10-CM | POA: Diagnosis not present

## 2018-08-10 DIAGNOSIS — Z7901 Long term (current) use of anticoagulants: Secondary | ICD-10-CM | POA: Insufficient documentation

## 2018-08-10 DIAGNOSIS — I10 Essential (primary) hypertension: Secondary | ICD-10-CM | POA: Diagnosis not present

## 2018-08-10 DIAGNOSIS — Z7982 Long term (current) use of aspirin: Secondary | ICD-10-CM | POA: Diagnosis not present

## 2018-08-10 DIAGNOSIS — Z87891 Personal history of nicotine dependence: Secondary | ICD-10-CM | POA: Insufficient documentation

## 2018-08-10 DIAGNOSIS — Z79899 Other long term (current) drug therapy: Secondary | ICD-10-CM | POA: Diagnosis not present

## 2018-08-10 LAB — CBC WITH DIFFERENTIAL/PLATELET
Abs Immature Granulocytes: 0.03 10*3/uL (ref 0.00–0.07)
Abs Immature Granulocytes: 0.03 10*3/uL (ref 0.00–0.07)
BASOS ABS: 0 10*3/uL (ref 0.0–0.1)
BASOS ABS: 0 10*3/uL (ref 0.0–0.1)
Basophils Relative: 1 %
Basophils Relative: 1 %
Eosinophils Absolute: 0.1 10*3/uL (ref 0.0–0.5)
Eosinophils Absolute: 0.1 10*3/uL (ref 0.0–0.5)
Eosinophils Relative: 1 %
Eosinophils Relative: 1 %
HCT: 35.2 % — ABNORMAL LOW (ref 39.0–52.0)
HCT: 36.1 % — ABNORMAL LOW (ref 39.0–52.0)
Hemoglobin: 10.9 g/dL — ABNORMAL LOW (ref 13.0–17.0)
Hemoglobin: 10.7 g/dL — ABNORMAL LOW (ref 13.0–17.0)
Immature Granulocytes: 0 %
Immature Granulocytes: 0 %
Lymphocytes Relative: 18 %
Lymphocytes Relative: 18 %
Lymphs Abs: 1.5 10*3/uL (ref 0.7–4.0)
Lymphs Abs: 1.5 10*3/uL (ref 0.7–4.0)
MCH: 29.2 pg (ref 26.0–34.0)
MCH: 28.3 pg (ref 26.0–34.0)
MCHC: 31 g/dL (ref 30.0–36.0)
MCHC: 29.6 g/dL — ABNORMAL LOW (ref 30.0–36.0)
MCV: 94.4 fL (ref 80.0–100.0)
MCV: 95.5 fL (ref 80.0–100.0)
MONO ABS: 0.8 10*3/uL (ref 0.1–1.0)
Monocytes Absolute: 0.8 10*3/uL (ref 0.1–1.0)
Monocytes Relative: 9 %
Monocytes Relative: 9 %
NEUTROS ABS: 6 10*3/uL (ref 1.7–7.7)
Neutro Abs: 6 10*3/uL (ref 1.7–7.7)
Neutrophils Relative %: 71 %
Neutrophils Relative %: 71 %
PLATELETS: 205 10*3/uL (ref 150–400)
Platelets: 208 10*3/uL (ref 150–400)
RBC: 3.73 MIL/uL — ABNORMAL LOW (ref 4.22–5.81)
RBC: 3.78 MIL/uL — ABNORMAL LOW (ref 4.22–5.81)
RDW: 13.8 % (ref 11.5–15.5)
RDW: 13.8 % (ref 11.5–15.5)
WBC: 8.4 10*3/uL (ref 4.0–10.5)
WBC: 8.3 10*3/uL (ref 4.0–10.5)
nRBC: 0 % (ref 0.0–0.2)
nRBC: 0 % (ref 0.0–0.2)

## 2018-08-10 LAB — PROTIME-INR
INR: 2.3 — ABNORMAL HIGH (ref 0.8–1.2)
Prothrombin Time: 25.3 seconds — ABNORMAL HIGH (ref 11.4–15.2)

## 2018-08-10 MED ORDER — SODIUM CHLORIDE 0.9 % IV BOLUS
500.0000 mL | Freq: Once | INTRAVENOUS | Status: AC
  Administered 2018-08-10: 500 mL via INTRAVENOUS

## 2018-08-10 MED ORDER — ONDANSETRON 4 MG PO TBDP
4.0000 mg | ORAL_TABLET | Freq: Once | ORAL | Status: AC
  Administered 2018-08-10: 4 mg via ORAL
  Filled 2018-08-10: qty 1

## 2018-08-10 NOTE — ED Notes (Signed)
Patient ambulated in the hallway with normal steady gait. Denies dizziness at this time.

## 2018-08-10 NOTE — ED Triage Notes (Signed)
Pt from home states he was getting out of the shower and noticed his varicose vein in right leg started to bleed. Pt states this same vein bleed 2 years ago. Pt on Coumadin (13 yrs) EMS controlled bleeding by 4x4 wrapped gauze. No clots of blood noted at scene.

## 2018-08-10 NOTE — Discharge Instructions (Signed)
Keep an eye on your blood pressure-- check 1-2 times daily, same time each day. Follow-up with your primary care doctor. Return here for any new/acute changes.

## 2018-08-10 NOTE — ED Notes (Signed)
Patient called out c/o nausea. PA made aware.

## 2018-08-10 NOTE — ED Notes (Signed)
Patient verbalizes understanding of medications and discharge instructions. No further questions at this time. VSS and patient ambulatory at discharge.   

## 2018-08-10 NOTE — ED Provider Notes (Signed)
[facility] EMERGENCY DEPARTMENT Provider Note   CSN: [id number] Arrival date & time: 08/10/18  0247    History   Chief Complaint Chief Complaint  Patient presents with  . bleeding from varicose vein    HPI [name] is a 58 y.o. male.     The history is provided by the patient and medical records.     58 y.o. M with hx of HLP. HTN, obesity, s/p aortic valve replacement on coumadin, sleep apnea, anemia,  presenting to the ED from bleeding of right lower leg.  States he thinks he burst a varicose vein again.  This has happened twice in the past.  States he does not remember injuring his leg or hitting it against anything.  No falls.  States he is not entirely sure how long leg was bleeding prior to EMS arrival, did appear to be a lot of blood on the floor/shower.  Last INR check was about a month ago, unsure of numbers.  No other complaints.  Past Medical History:  Diagnosis Date  . Ascending aortic aneurysm and dissection    2006 repaired  7 cm aneurysm  . Chest pain, atypical   . Coronary artery reimplantation   . Hyperlipidemia    Mixed  . Hypertension    Unspecified  . Obesity   . S/P aortic valve and root replacement    HX of St. Jude  . Seizures (HCC)   . Sleep apnea, obstructive     Patient Active Problem List   Diagnosis Date Noted  . Abrasion, right lower leg, sequela 11/23/2017  . Acute reaction to stress 11/23/2017  . Anemia 11/23/2017  . Chronic pain syndrome 11/23/2017  . Common cold 11/23/2017  . Contusion of head 11/23/2017  . Edema 11/23/2017  . Elevated blood pressure reading without diagnosis of hypertension 11/23/2017  . Encounter for removal of sutures 11/23/2017  . Gastro-esophageal reflux disease without esophagitis 11/23/2017  . Hematuria 11/23/2017  . Hemorrhoids 11/23/2017  . Iron deficiency anemia 11/23/2017  . Localized swelling, mass and lump, unspecified 11/23/2017  . Other abnormal glucose 11/23/2017  .  Other amnesia 11/23/2017  . Other long term (current) drug therapy 11/23/2017  . Pain in joint, pelvic region and thigh 11/23/2017  . Pain in right hip 11/23/2017  . Postconcussional syndrome 11/23/2017  . Supraventricular premature beats 11/23/2017  . Traumatic subdural hemorrhage with loss of consciousness of unspecified duration, sequela (HCC) 11/23/2017  . Varicose veins of right lower extremity with inflammation 11/23/2017  . Convulsions/seizures (HCC) 08/30/2015  . Left leg weakness   . SDH (subdural hematoma) (HCC) 03/17/2015  . Post-concussion headache 03/17/2015  . Faintness   . Encounter for therapeutic drug monitoring 07/08/2013  . Cardiomegaly 06/29/2013  . Snoring 06/29/2013  . Diarrhea 06/29/2013  . Benign hypertensive heart disease without congestive heart failure 02/06/2013  . Pure hypercholesterolemia 02/06/2013  . Restrictive lung disease 03/21/2011  . Aortic valve disorder 07/29/2010  . S/P aortic valve replacement 07/29/2010  . Long term current use of anticoagulant 07/29/2010  . Other psoriasis 07/08/2010  . Cellulitis and abscess 06/28/2010  . HYPERLIPIDEMIA-MIXED 11/20/2008  . OBESITY 11/20/2008  . Obstructive sleep apnea 11/20/2008  . Essential hypertension 11/20/2008    Past Surgical History:  Procedure Laterality Date  . Ascending aortic dissection aneurysm  12/06/2004   ok for MRI 1.5 or 3T Using a 27-mm St. Jude mechanical valve conduit with reimplantation of both coronary arteries  . CORNEAL TRANSPLANT  R   . KNEE SURGERY     R  . Replacement of aortic valve  12/06/2004        Home Medications    Prior to Admission medications   Medication Sig Start Date End Date Taking? Authorizing Provider  acetaminophen (TYLENOL) 325 MG tablet Take 2 tablets (650 mg total) by mouth every 6 (six) hours as needed. 05/20/18   Khatri, Hina, PA-C  aspirin 81 MG tablet Take 81 mg by mouth daily.    [provider]  Cholecalciferol (VITAMIN D) 2000  units tablet Take 2,000 Units by mouth 2 (two) times daily.    [provider]  furosemide (LASIX) 20 MG tablet Take 2 tablets (40 mg total) by mouth daily. 02/25/18 02/20/19  Tonny Bollman, MD  lidocaine (LIDODERM) 5 % Place 1 patch onto the skin daily. Remove & Discard patch within 12 hours or as directed by MD 05/30/18   Aviva Kluver B, PA-C  methocarbamol (ROBAXIN) 500 MG tablet Take 1 tablet (500 mg total) by mouth 2 (two) times daily. 05/20/18   Khatri, Hina, PA-C  metoprolol succinate (TOPROL-XL) 50 MG 24 hr tablet Take 50 mg by mouth daily.  08/29/13   [provider]  Multiple Vitamins-Minerals (CENTRUM SILVER ADULT 50+) TABS 1 qd    [provider]  oxyCODONE-acetaminophen (PERCOCET/ROXICET) 5-325 MG tablet Take 1 tablet by mouth every 6 (six) hours as needed. AS NEEDED FOR PAIN 03/31/17   [provider]  potassium chloride (K-DUR) 10 MEQ tablet Take 1 tablet (10 mEq total) by mouth daily. 02/25/18 02/20/19  Tonny Bollman, MD  pregabalin (LYRICA) 75 MG capsule Take 75 mg by mouth 2 (two) times daily.    [provider]  rosuvastatin (CRESTOR) 10 MG tablet Take 10 mg by mouth daily.     [provider]  vitamin C (ASCORBIC ACID) 500 MG tablet Take 500 mg by mouth daily.    [provider]  warfarin (COUMADIN) 5 MG tablet TAKE AS DIRECTED BY COUMADIN CLINIC 02/09/18   Tonny Bollman, MD    Family History Family History  Problem Relation Age of Onset  . Cervical cancer Mother   . Coronary artery disease Mother   . Diabetes Mother   . Diabetes Father   . Cervical cancer Sister     Social History Social History   Tobacco Use  . Smoking status: Former Smoker    Packs/day: 0.10    Years: 18.00    Pack years: 1.80    Types: Cigarettes    Last attempt to quit: 12/07/1989    Years since quitting: 28.6  . Smokeless tobacco: Former Engineer, water Use Topics  . Alcohol use: No    Comment: Quit 1991  . Drug use: No      Allergies   Nsaids   Review of Systems Review of Systems  Skin: Positive for wound.  All other systems reviewed and are negative.    Physical Exam Updated Vital Signs BP 95/60 (BP Location: Left Arm)   Pulse 73   Temp 98 F (36.7 C) (Oral)   Resp 16   SpO2 95%   Physical Exam Vitals signs and nursing note reviewed.  Constitutional:      Appearance: He is well-developed.  HENT:     Head: Normocephalic and atraumatic.  Eyes:     Conjunctiva/sclera: Conjunctivae normal.     Pupils: Pupils are equal, round, and reactive to light.  Neck:     Musculoskeletal: Normal range  of motion.  Cardiovascular:     Rate and Rhythm: Normal rate and regular rhythm.     Heart sounds: Normal heart sounds.  Pulmonary:     Effort: Pulmonary effort is normal.     Breath sounds: Normal breath sounds.  Abdominal:     General: Bowel sounds are normal.     Palpations: Abdomen is soft.  Musculoskeletal: Normal range of motion.     Comments: Brisk bleeding from varicose vein of right lower leg; no signs of infection; good distal perfusion   Skin:    General: Skin is warm and dry.  Neurological:     Mental Status: He is alert and oriented to person, place, and time.      ED Treatments / Results  Labs (all labs ordered are listed, but only abnormal results are displayed) Labs Reviewed  PROTIME-INR - Abnormal; Notable for the following components:      Result Value   Prothrombin Time 25.3 (*)    INR 2.3 (*)    All other components within normal limits  CBC WITH DIFFERENTIAL/PLATELET - Abnormal; Notable for the following components:   RBC 3.73 (*)    Hemoglobin 10.9 (*)    HCT 35.2 (*)    All other components within normal limits  CBC WITH DIFFERENTIAL/PLATELET - Abnormal; Notable for the following components:   RBC 3.78 (*)    Hemoglobin 10.7 (*)    HCT 36.1 (*)    MCHC 29.6 (*)    All other components within normal limits    EKG None  Radiology No results  found.  Procedures Wound repair Date/Time: 08/10/2018 3:28 AM Performed by: Garlon Hatchet, PA-C Authorized by: Garlon Hatchet, PA-C  Consent: Verbal consent obtained. Risks and benefits: risks, benefits and alternatives were discussed Consent given by: patient Required items: required blood products, implants, devices, and special equipment available Patient identity confirmed: verbally with patient Time out: Immediately prior to procedure a "time out" was called to verify the correct patient, procedure, equipment, support staff and site/side marked as required. Preparation: Patient was prepped and draped in the usual sterile fashion. Local anesthesia used: no  Anesthesia: Local anesthesia used: no  Sedation: Patient sedated: no  Patient tolerance: Patient tolerated the procedure well with no immediate complications Comments: quik clot gauze applied followed by pressure dressing to right lower leg.  No evidence of vascular compromise.    (including critical care time)  Medications Ordered in ED Medications - No data to display   Initial Impression / Assessment and Plan / ED Course  I have reviewed the triage vital signs and the nursing notes.  Pertinent labs & imaging results that were available during my care of the patient were reviewed by me and considered in my medical decision making (see chart for details).  58 year old male here with bleeding from varicose vein of the right lower leg.  History of same in the past.  Denies any known injury or fall.  Has brisk bleeding from varicose vein of right lower leg, anterior shin.  There is no sign of infection.  Quick clot gauze and pressure dressing were applied.  Patient is on Coumadin.  Will check CBC as well as INR.  Will monitor.  Patient's CBC is overall reassuring, mild anemia which is chronic.  His INR is therapeutic at 2.3.  No further bleeding from varicose vein.  Patient's blood pressure has been somewhat soft here.   He denies any fever or other infectious symptoms.  He  has not had any changes in his blood pressure medications.  Will give IV fluid bolus, monitor, and repeat CBC.  Repeat CBC is grossly unchanged.  BP has improved with IV fluids, 105/54.  Patient was able to ambulate around with steady gait, no dizziness.  He maintains that he has been well leading up to tonight with episode of bleeding.  He remains afebrile without any infectious symptoms.  He does not have any exam findings or other symptoms concerning for sepsis.  He does report that he takes his blood pressure medication at night, question if low readings here are indicative of his normal nighttime drops in blood pressure that he normally sleeps through.  States he has no symptoms during the day when up and awake.  At this point, feel he is stable for discharge home.  We will have him monitor his blood pressure at home, check 1-2 times a day, same time each day.  He will need close follow-up with PCP.  Return here for any new or acute changes.  Seen and evaluated with attending physician, Dr. Elesa Massed, who evaluated patient and agrees with assessment and plan of care.  Final Clinical Impressions(s) / ED Diagnoses   Final diagnoses:  Bleeding from varicose vein    ED Discharge Orders    None       Garlon Hatchet, PA-C 08/10/18 7903    Ward, Layla Maw, DO 08/10/18 0710

## 2018-08-25 ENCOUNTER — Telehealth: Payer: Self-pay | Admitting: Physician Assistant

## 2018-08-25 NOTE — Telephone Encounter (Signed)
Left message for patient to call back regarding upcoming routine office visit.  Tereso Newcomer, PA-C  08/25/2018 2:01 PM

## 2018-08-26 DIAGNOSIS — I480 Paroxysmal atrial fibrillation: Secondary | ICD-10-CM | POA: Insufficient documentation

## 2018-08-26 DIAGNOSIS — I5032 Chronic diastolic (congestive) heart failure: Secondary | ICD-10-CM | POA: Insufficient documentation

## 2018-08-26 NOTE — Progress Notes (Signed)
Cardiology Office Note:    Date:  08/27/2018   ID:  [name], DOB Jan 06, 1961, MRN [id number]  PCP:  Lorenda Ishihara, MD  Cardiologist:  Tonny Bollman, MD   Electrophysiologist:  None   Referring MD: Lorenda Ishihara,*   Chief Complaint  Patient presents with  . Follow-up    hx of AVR and TAA repair     History of Present Illness:    [name] is a 58 y.o. male with aortic valve disease and thoracic aortic aneurysm s/p TAA repair with mechanical aortic valve replacement and reimplantation of coronary arteries in 2006, paroxysmal atrial fibrillation, diastolic HF, hypertension, hyperlipidemia, severe OSA, morbid obesity, venous insufficiency with associated stasis.  He is on chronic anticoagulation with Warfarin.  He was last seen by Dr. Excell Seltzer in Sept 2019.     [name] returns for follow up.  He is here alone.  He recently moved.  He has been doing more activity than normal.  He has noticed worsening shortness of breath with exertion.  He also notes exertional chest discomfort described as tightness.  He denies any radiating symptoms.  He denies syncope.  He denies orthopnea or paroxysmal nocturnal dyspnea.  He has chronic lower extremity swelling.  He had a wound on his right leg recently that bled significantly and he went to the emergency room.  It sounds as though he either had a bleeding varicosity or a ruptured capillary.  He tells me that he has a referral pending to vascular surgery.  Prior CV studies:   The following studies were reviewed today:  Echo 01/20/18 Severe LVH, EF 60-65, no RWMA, Gr 1 DD, mechanical AVR functioning normally (mean 21), mod LAE, severe RAE, trivial TR, PASP 32  Echo 03/18/15 Severe LVH, EF 50-55, no RWMA, normally functioning AVR, mod LAE, mild RAE  Past Medical History:  Diagnosis Date  . Ascending aortic aneurysm and dissection    2006 repaired  7 cm aneurysm  . Chest pain, atypical   . Coronary artery  reimplantation   . Hyperlipidemia    Mixed  . Hypertension    Unspecified  . Obesity   . Psoriasis   . S/P aortic valve and root replacement    HX of St. Jude  . Seizures (HCC)   . Sleep apnea, obstructive    Surgical Hx: The patient  has a past surgical history that includes Replacement of aortic valve (12/06/2004); Ascending aortic dissection aneurysm (12/06/2004); Knee surgery; and Corneal transplant.   Current Medications: Current Meds  Medication Sig  . acetaminophen (TYLENOL) 325 MG tablet Take 2 tablets (650 mg total) by mouth every 6 (six) hours as needed.  Marland Kitchen aspirin 81 MG tablet Take 81 mg by mouth daily.  . Cholecalciferol (VITAMIN D) 2000 units tablet Take 2,000 Units by mouth 2 (two) times daily.  . furosemide (LASIX) 20 MG tablet Take 2 tablets (40 mg total) by mouth daily.  Marland Kitchen lidocaine (LIDODERM) 5 % Place 1 patch onto the skin daily. Remove & Discard patch within 12 hours or as directed by MD  . metoprolol succinate (TOPROL-XL) 50 MG 24 hr tablet Take 50 mg by mouth daily.   . Multiple Vitamins-Minerals (CENTRUM SILVER ADULT 50+) TABS 1 qd  . potassium chloride (K-DUR) 10 MEQ tablet Take 1 tablet (10 mEq total) by mouth daily.  . pregabalin (LYRICA) 75 MG capsule Take 75 mg by mouth 2 (two) times daily.  . rosuvastatin (CRESTOR) 10 MG tablet Take 10 mg by  mouth daily.   . vitamin C (ASCORBIC ACID) 500 MG tablet Take 500 mg by mouth daily.  Marland Kitchen warfarin (COUMADIN) 5 MG tablet TAKE AS DIRECTED BY COUMADIN CLINIC     Allergies:   Nsaids   Social History   Tobacco Use  . Smoking status: Former Smoker    Packs/day: 0.10    Years: 18.00    Pack years: 1.80    Types: Cigarettes    Last attempt to quit: 12/07/1989    Years since quitting: 28.7  . Smokeless tobacco: Former Engineer, water Use Topics  . Alcohol use: No    Comment: Quit 1991  . Drug use: No     Family Hx: The patient's family history includes Cervical cancer in his mother and sister; Coronary artery  disease in his mother; Diabetes in his father and mother.  ROS:   Please see the history of present illness.    ROS All other systems reviewed and are negative.   EKGs/Labs/Other Test Reviewed:    EKG:  EKG is   ordered today.  The ekg ordered today demonstrates sinus bradycardia, heart rate 57, normal axis, no acute ST-T wave changes, first-degree AV block, PR 224, QTC 439  Recent Labs: 05/30/2018: BUN 12; Creatinine, Ser 0.90; Potassium 4.0; Sodium 142 08/10/2018: Hemoglobin 10.7; Platelets 208   Recent Lipid Panel No results found for: CHOL, TRIG, HDL, CHOLHDL, LDLCALC, LDLDIRECT  From [facility] Cholesterol, total  150.000  02/01/2018 HDL    49.000  02/01/2018 LDL    73.000  02/01/2018 Triglycerides   138.000  02/01/2018 A1C    6.000  02/01/2018 Hemoglobin   10.700  08/10/2018 Creatinine, Serum  1.000   08/19/2018 Potassium   4.000   08/19/2018 ALT (SGPT)   38.000  08/19/2018 TSH    1.360   06/10/2017 INR    2.300   08/10/2018   Physical Exam:    VS:  BP 130/80   Pulse (!) 59   Ht  (1.88 m)   Wt (!) 327 lb (148.3 kg)   SpO2 98%   BMI 41.98 kg/m     Wt Readings from Last 3 Encounters:  08/27/18 (!) 327 lb (148.3 kg)  05/30/18 (!) 315 lb (142.9 kg)  05/20/18 (!) 315 lb (142.9 kg)     Physical Exam  Constitutional: He is oriented to person, place, and time. He appears well-developed and well-nourished. No distress.  HENT:  Head: Normocephalic and atraumatic.  Eyes: No scleral icterus.  Neck: Neck supple. JVD present. No thyromegaly present.  Cardiovascular: Normal rate, regular rhythm, S1 normal and S2 normal.  Murmur heard.  Systolic murmur is present with a grade of 2/6 at the upper right sternal border. Normal S1; mechanical S2  Pulmonary/Chest: Breath sounds normal. He has no rales.  Abdominal: Soft. There is no hepatomegaly.  Musculoskeletal:        General: Edema (1-2+ bilat LE edema) present.  Lymphadenopathy:    He has no cervical adenopathy.  Neurological:  He is alert and oriented to person, place, and time.  Skin: Skin is warm and dry.  Psychiatric: He has a normal mood and affect.    ASSESSMENT & PLAN:    Exertional chest pain  He notes recent onset of exertional chest discomfort.  He also has chest discomfort related to meals which has been reported in the past.  He describes shortness of breath with exertion.  His breathing has also worsened.  At the time of his surgery  in 2006, his coronary arteries could not be engaged to cardiac catheterization.  His ECG does not demonstrate any acute findings.  Given his exertional symptoms, I think we should pursue further testing.  I will arrange a Lexiscan Myoview.  Continue aspirin, statin.  Chronic diastolic heart failure (HCC)  He is more short of breath with exertion.  His exam does suggest some volume excess.  I have asked him to increase his Lasix to 40 mg twice daily for 3 days along with potassium 10 mEq twice daily.  He will resume his usual dose of Lasix and potassium afterward.  Obtain BNP today.  He does note significant bleeding from his leg recently when he went to the emergency room.  Obtain follow-up CBC.  S/P aortic valve replacement Normally functioning valve by echocardiogram in August 2019.  Continue aspirin, warfarin.  Continue SBE prophylaxis.  Paroxysmal atrial fibrillation (HCC)  Maintaining normal sinus rhythm.  CHADS2-VASc=2 (HTN, CHF).  He is on long-term anticoagulation with warfarin due to his mechanical aortic valve.  Hypertensive heart disease with chronic diastolic congestive heart failure (HCC) The patient's blood pressure is controlled on his current regimen.  Continue current therapy.   OSA (obstructive sleep apnea)   Continue CPAP.  Dispo:  Return in about 6 months (around 02/27/2019) for Routine Follow Up, w/ Dr. Excell Seltzer.   Medication Adjustments/Labs and Tests Ordered: Current medicines are reviewed at length with the patient today.  Concerns regarding  medicines are outlined above.  Tests Ordered: Orders Placed This Encounter  Procedures  . Pro b natriuretic peptide (BNP)  . CBC  . MYOCARDIAL PERFUSION IMAGING  . EKG 12-Lead   Medication Changes: No orders of the defined types were placed in this encounter.   Signed, Tereso Newcomer, PA-C  08/27/2018 9:23 AM    [facility] [address] [place], [place], [place]  [address] Phone: [phone]; Fax: [phone]

## 2018-08-27 ENCOUNTER — Ambulatory Visit (INDEPENDENT_AMBULATORY_CARE_PROVIDER_SITE_OTHER): Payer: No Typology Code available for payment source | Admitting: *Deleted

## 2018-08-27 ENCOUNTER — Encounter: Payer: Self-pay | Admitting: *Deleted

## 2018-08-27 ENCOUNTER — Other Ambulatory Visit: Payer: Self-pay

## 2018-08-27 ENCOUNTER — Ambulatory Visit (INDEPENDENT_AMBULATORY_CARE_PROVIDER_SITE_OTHER): Payer: No Typology Code available for payment source | Admitting: Physician Assistant

## 2018-08-27 ENCOUNTER — Encounter: Payer: Self-pay | Admitting: Physician Assistant

## 2018-08-27 VITALS — BP 130/80 | HR 59 | Ht 74.0 in | Wt 327.0 lb

## 2018-08-27 DIAGNOSIS — Z7901 Long term (current) use of anticoagulants: Secondary | ICD-10-CM | POA: Diagnosis not present

## 2018-08-27 DIAGNOSIS — I480 Paroxysmal atrial fibrillation: Secondary | ICD-10-CM | POA: Diagnosis not present

## 2018-08-27 DIAGNOSIS — Z5181 Encounter for therapeutic drug level monitoring: Secondary | ICD-10-CM

## 2018-08-27 DIAGNOSIS — I359 Nonrheumatic aortic valve disorder, unspecified: Secondary | ICD-10-CM | POA: Diagnosis not present

## 2018-08-27 DIAGNOSIS — R079 Chest pain, unspecified: Secondary | ICD-10-CM | POA: Diagnosis not present

## 2018-08-27 DIAGNOSIS — I110 Hypertensive heart disease with heart failure: Secondary | ICD-10-CM

## 2018-08-27 DIAGNOSIS — I5032 Chronic diastolic (congestive) heart failure: Secondary | ICD-10-CM

## 2018-08-27 DIAGNOSIS — Z952 Presence of prosthetic heart valve: Secondary | ICD-10-CM

## 2018-08-27 DIAGNOSIS — G4733 Obstructive sleep apnea (adult) (pediatric): Secondary | ICD-10-CM

## 2018-08-27 LAB — CBC
Hematocrit: 29.7 % — ABNORMAL LOW (ref 37.5–51.0)
Hemoglobin: 9.3 g/dL — ABNORMAL LOW (ref 13.0–17.7)
MCH: 29.2 pg (ref 26.6–33.0)
MCHC: 31.3 g/dL — AB (ref 31.5–35.7)
MCV: 93 fL (ref 79–97)
Platelets: 261 10*3/uL (ref 150–450)
RBC: 3.19 x10E6/uL — ABNORMAL LOW (ref 4.14–5.80)
RDW: 13.6 % (ref 11.6–15.4)
WBC: 6.7 10*3/uL (ref 3.4–10.8)

## 2018-08-27 LAB — POCT INR: INR: 2.1 (ref 2.0–3.0)

## 2018-08-27 LAB — PRO B NATRIURETIC PEPTIDE: NT-Pro BNP: 39 pg/mL (ref 0–210)

## 2018-08-27 NOTE — Patient Instructions (Signed)
Description   Today take 2 tablets then start taking 1.5 tablets daily except 2 tablets on Fridays.  Recheck in 6 weeks. Call us with any procedures or new medications [phone] & Fax number is [phone]

## 2018-08-27 NOTE — Patient Instructions (Signed)
                                                                                                                                               Medication Instructions:  FOR  THREE DAYS ONLY :   TAKE LASIX 40 MG TWICE A DAY  TAKE POTASSIUM 10 MEQ TWICE A DAY    THEN RESUME TAKING:  TAKE LASIX 40 MG ONCE A DAY   TAKE  POTASSIUM 10 MEQ ONCE A DAY   If you need a refill on your cardiac medications before your next appointment, please call your pharmacy.   Lab work:  CBC AND  BNP TODAY   If you have labs (blood work) drawn today and your tests are completely normal, you will receive your results only by: Marland Kitchen MyChart Message (if you have MyChart) OR . A paper copy in the mail If you have any lab test that is abnormal or we need to change your treatment, we will call you to review the results.  Testing/Procedures: Your physician has requested that you have a lexiscan myoview. For further information please visit [email or web address] Please follow instruction sheet, as given.   Follow-Up: At [facility], you and your health needs are our priority.  As part of our continuing mission to provide you with exceptional heart care, we have created designated Provider Care Teams.  These Care Teams include your primary Cardiologist (physician) and Advanced Practice Providers (APPs -  Physician Assistants and Nurse Practitioners) who all work together to provide you with the care you need, when you need it. You will need a follow up appointment in:  6 months.  Please call our office 2 months in advance to schedule this appointment.  You may see Tonny Bollman, MD  or one of the following Advanced Practice Providers on your designated Care Team: Tereso Newcomer, PA-C [place], [place] . Berton Bon, NP  Any Other Special Instructions Will Be Listed Below (If Applicable).

## 2018-08-30 ENCOUNTER — Ambulatory Visit (HOSPITAL_COMMUNITY): Payer: No Typology Code available for payment source | Attending: Cardiovascular Disease

## 2018-08-30 ENCOUNTER — Other Ambulatory Visit: Payer: Self-pay

## 2018-08-30 DIAGNOSIS — R079 Chest pain, unspecified: Secondary | ICD-10-CM | POA: Insufficient documentation

## 2018-08-30 DIAGNOSIS — I480 Paroxysmal atrial fibrillation: Secondary | ICD-10-CM | POA: Diagnosis not present

## 2018-08-30 MED ORDER — TECHNETIUM TC 99M TETROFOSMIN IV KIT
32.7000 | PACK | Freq: Once | INTRAVENOUS | Status: AC | PRN
  Administered 2018-08-30: 32.7 via INTRAVENOUS
  Filled 2018-08-30: qty 33

## 2018-08-30 MED ORDER — REGADENOSON 0.4 MG/5ML IV SOLN
0.4000 mg | Freq: Once | INTRAVENOUS | Status: AC
  Administered 2018-08-30: 0.4 mg via INTRAVENOUS

## 2018-08-31 ENCOUNTER — Other Ambulatory Visit: Payer: Self-pay

## 2018-08-31 ENCOUNTER — Ambulatory Visit (HOSPITAL_COMMUNITY): Payer: No Typology Code available for payment source | Attending: Cardiovascular Disease

## 2018-08-31 ENCOUNTER — Telehealth: Payer: Self-pay | Admitting: *Deleted

## 2018-08-31 ENCOUNTER — Encounter: Payer: Self-pay | Admitting: Physician Assistant

## 2018-08-31 LAB — MYOCARDIAL PERFUSION IMAGING
CHL CUP RESTING HR STRESS: 45 {beats}/min
LV dias vol: 177 mL (ref 62–150)
LV sys vol: 75 mL
Peak HR: 63 {beats}/min
SDS: 0
SRS: 0
SSS: 0
TID: 1.06

## 2018-08-31 MED ORDER — TECHNETIUM TC 99M TETROFOSMIN IV KIT
32.8000 | PACK | Freq: Once | INTRAVENOUS | Status: AC | PRN
  Administered 2018-08-31: 32.8 via INTRAVENOUS
  Filled 2018-08-31: qty 33

## 2018-08-31 NOTE — Telephone Encounter (Signed)
LMOVM  FOR PT  RETURNING HIS  CALL BACK TO CLINIC ABOUT RESULTS AND RECOMMENDATIONS.  PT ASKED FOR GOOD CONTACT NUMBER AND TIME TO BE LEFT IN MESSAGE IF PT HAS TO LEAVE A MESSAGE FOR ME .

## 2018-08-31 NOTE — Telephone Encounter (Signed)
LMOVM  FOR PT TO CALL BACK TO CLINIC ABOUT RESULTS AND RECOMMENDATIONS.  PT ASKED FOR GOOD CONTACT NUMBER AND TIME TO BE LEFT IN MESSAGE IF PT HAS TO LEAVE A MESSAGE.

## 2018-08-31 NOTE — Telephone Encounter (Signed)
  Patient is returning your call and I do not have a number to reach you and unable to skype you. Please call back

## 2018-09-01 NOTE — Telephone Encounter (Signed)
SPOKE WITH PT ABOUT RESULTS AND PT VERBALIZED UNDERSTANDING. PT WAS TOLD THAT THE PROVIDER WILL BE CONTACTED BACK TO SEE IF THE RX RECOMMENDED IS STILL NEEDED DUE TO STRESS TEST NOT BEING CANCELLED   PT WAS AGREEABLE WITH TELEVISIT BUT PREFERS DR Excell Seltzer.

## 2018-09-15 ENCOUNTER — Other Ambulatory Visit: Payer: Self-pay | Admitting: Internal Medicine

## 2018-10-07 ENCOUNTER — Telehealth: Payer: Self-pay

## 2018-10-07 NOTE — Telephone Encounter (Signed)
lmom for prescreen reschedule

## 2018-10-07 NOTE — Telephone Encounter (Signed)
Follow up   Patient is returning call to see what time he can be rescheduled for coumadin visit. Please call.

## 2018-10-08 ENCOUNTER — Other Ambulatory Visit: Payer: Self-pay | Admitting: Internal Medicine

## 2018-10-08 ENCOUNTER — Other Ambulatory Visit: Payer: Self-pay | Admitting: Cardiovascular Disease

## 2018-10-08 DIAGNOSIS — Z952 Presence of prosthetic heart valve: Secondary | ICD-10-CM

## 2018-10-08 DIAGNOSIS — Z7901 Long term (current) use of anticoagulants: Secondary | ICD-10-CM

## 2018-10-08 DIAGNOSIS — Z5181 Encounter for therapeutic drug level monitoring: Secondary | ICD-10-CM

## 2018-10-08 DIAGNOSIS — I359 Nonrheumatic aortic valve disorder, unspecified: Secondary | ICD-10-CM

## 2018-10-11 ENCOUNTER — Telehealth: Payer: Self-pay

## 2018-10-11 NOTE — Telephone Encounter (Signed)

## 2018-10-11 NOTE — Telephone Encounter (Signed)
Hold for 10/12/18 appt

## 2018-10-12 ENCOUNTER — Ambulatory Visit (INDEPENDENT_AMBULATORY_CARE_PROVIDER_SITE_OTHER): Payer: Self-pay | Admitting: Pharmacist

## 2018-10-12 ENCOUNTER — Other Ambulatory Visit: Payer: Self-pay

## 2018-10-12 ENCOUNTER — Telehealth: Payer: Self-pay | Admitting: Pharmacist

## 2018-10-12 DIAGNOSIS — Z7901 Long term (current) use of anticoagulants: Secondary | ICD-10-CM

## 2018-10-12 DIAGNOSIS — I359 Nonrheumatic aortic valve disorder, unspecified: Secondary | ICD-10-CM

## 2018-10-12 DIAGNOSIS — R002 Palpitations: Secondary | ICD-10-CM

## 2018-10-12 DIAGNOSIS — Z5181 Encounter for therapeutic drug level monitoring: Secondary | ICD-10-CM

## 2018-10-12 DIAGNOSIS — R079 Chest pain, unspecified: Secondary | ICD-10-CM

## 2018-10-12 DIAGNOSIS — Z952 Presence of prosthetic heart valve: Secondary | ICD-10-CM

## 2018-10-12 LAB — POCT INR: INR: 3.7 — AB (ref 2.0–3.0)

## 2018-10-12 MED ORDER — WARFARIN SODIUM 5 MG PO TABS: ORAL_TABLET | ORAL | 1 refills | Status: DC

## 2018-10-12 NOTE — Telephone Encounter (Signed)
During anticoag visit with patient, he mentioned that he tried walking the other day and he hasnt walked very far when his chest felt tight or "not right" and his heart was beating very fast. Said he could hear "his valve clicking" and he could barely make it home. Sat down and rested and his heart slowed down and he felt better. States he want to start walking again to lose so weight now that his pain is under a little bit better control, but is concerned with what happened and would like to talk to someone about it. Advised that I will send this message to Dr. Excell Seltzer and his RN for further advice.

## 2018-10-13 NOTE — Telephone Encounter (Signed)
Coronary CT ordered for scheduling.  Called patient to review new plan and instructions.  Left message to call back.

## 2018-10-13 NOTE — Telephone Encounter (Signed)
Reviewed his chart.  He saw Tereso Newcomer in March with the same symptoms.  A nuclear stress test was ordered, but I suspect it has been delayed because of the COVID-19 pandemic.  I am going to check with Dr. Delton See to see if a coronary CTA might be a better study for him considering his size.

## 2018-10-13 NOTE — Telephone Encounter (Signed)
[name] please place an order for a coronary CTA, Huntley Dec and Jasmine December please schedule in the next 2-3 weeks. Thank you, KN

## 2018-10-14 NOTE — Telephone Encounter (Signed)
Oh I completely missed that. I thought it was delayed for Covid. Sorry about the confusion. No reason to do a CTA after a normal nuclear scan. thanks

## 2018-10-14 NOTE — Telephone Encounter (Signed)
Called patient to discuss normal stress test and continued symptoms.  Left message to call back.  Coronary CT cancelled.

## 2018-10-14 NOTE — Telephone Encounter (Signed)
Dr. Excell Seltzer- this patient had lexiscan 3/23- results available in chart review. Per Scott's notes,  "Notes recorded by Beatrice Lecher, PA-C on 08/31/2018 at 3:56 PM EDT Please call the patient. The stress test is normal.  Recommendations: - Continue current medications and follow up as planned.  - Send copy to PCP. Tereso Newcomer, PA-C   08/31/2018 3:55 PM"  Would you like to proceed with CT since the patient is having continued symptoms?

## 2018-10-15 ENCOUNTER — Encounter: Payer: Self-pay | Admitting: Cardiovascular Disease

## 2018-10-15 NOTE — Telephone Encounter (Signed)
This encounter was created in error - please disregard.

## 2018-10-15 NOTE — Telephone Encounter (Signed)
Received message that the patient called 1 hour ago.   Left message to call back.

## 2018-10-15 NOTE — Telephone Encounter (Signed)
Yes.  Let's get a Zio 14 day monitor.  He can do the regular one, not the live tele. Thanks [employer], [place]    10/15/2018 1:28 PM

## 2018-10-15 NOTE — Telephone Encounter (Signed)
Left message to call back  

## 2018-10-15 NOTE — Telephone Encounter (Signed)
New message ° °Pt is returning call  ° °Please call back °

## 2018-10-15 NOTE — Telephone Encounter (Signed)
Spoke to the patient and reiterated normal stress test results.  He states he does not have CP, but he has heart palpitations and he feels like his heart is racing when he exerts himself. He has been unable to exercise because the palpitations make him nervous. When he is at rest he is asymptomatic.

## 2018-10-15 NOTE — Telephone Encounter (Signed)
Agreed thanks!

## 2018-10-19 ENCOUNTER — Telehealth: Payer: Self-pay | Admitting: Radiology

## 2018-10-19 NOTE — Telephone Encounter (Signed)
14 day Zio patch ordered. Dx: palpitations The patient agrees with treatment plan.

## 2018-10-19 NOTE — Telephone Encounter (Signed)
Enrolled patient for a 14 day Zio monitor to be mailed. Brief instructions were gone over with patient and his wife and they know to expect the monitor to arrive in 3-4 days.

## 2018-10-24 ENCOUNTER — Ambulatory Visit (INDEPENDENT_AMBULATORY_CARE_PROVIDER_SITE_OTHER): Payer: No Typology Code available for payment source

## 2018-10-24 DIAGNOSIS — R002 Palpitations: Secondary | ICD-10-CM

## 2018-11-05 ENCOUNTER — Telehealth: Payer: Self-pay | Admitting: *Deleted

## 2018-11-05 NOTE — Telephone Encounter (Signed)
   [facility] Pre-operative Risk Assessment    Request for surgical clearance:  1. What type of surgery is being performed? LUMBAR EPIDURAL INJECTION  2. When is this surgery scheduled? 11/23/18  3. What type of clearance is required (medical clearance vs. Pharmacy clearance to hold med vs. Both)? PHARM  4. Are there any medications that need to be held prior to surgery and how long? COUMADIN  AND ASA X 7 DAYS PRIOR   5. Practice name and name of physician performing surgery? [facility]; DR. Kreamer  6. What is your office phone number [phone]   7.   What is your office fax number [phone]  8.   Anesthesia type (None, local, MAC, general) ? NONE LISTED  Julaine Hua 11/05/2018, 12:07 PM  _________________________________________________________________   (provider comments below)

## 2018-11-05 NOTE — Telephone Encounter (Signed)
Patient with diagnosis of mechanical aortic valve replacement and afib on warfarin for anticoagulation.  INR goal 2.5-3.5  Procedure: LUMBAR EPIDURAL INJECTION Date of procedure: 11/23/2018  CHADS2-VASc score of  2 (CHF, HTN, AGE, DM2, stroke/tia x 2, CAD, AGE, male)  CrCl 150ml/min Platelet count 261  Per office protocol, patient can hold warfarin for 5 days prior to procedure.   Patient WILL need bridging with Lovenox (enoxaparin) around procedure.  Bridging will be coordinated in the coumadin clinic.

## 2018-11-08 NOTE — Telephone Encounter (Signed)
   Primary Cardiologist: Tonny Bollman, MD  Chart reviewed as part of pre-operative protocol coverage. Patient was contacted 11/08/2018 in reference to pre-operative risk assessment for pending surgery as outlined below.  [name] was last seen on [facility] on 08/27/18. Pt has hx of aortic valve disease and thoracic aortic aneurysm s/p TAA repair with mechanical aortic valve replacement and reimplantation of coronary arteries in 2006, paroxysmal atrial fibrillation, chronic diastolic HF, hypertension, hyperlipidemia, severe OSA, morbid obesity, venous insufficiency with associated stasis. At that visit he had exertional chest discomfort. He was in NSR. Nuclear stress test was normal, EF 58%. Zio patch is pending for palpitations.  As below, Dr. Excell Seltzer has reviewed and feels he can procedure and does not need to wait for monitor result. I called patient to discuss how he is feeling so we can finalize recommendations. LMOM to call us back.  Per pharmD, "Per office protocol, patient can hold warfarin for 5 days prior to procedure. Patient WILL need bridging with Lovenox (enoxaparin) around procedure. Bridging will be coordinated in the coumadin clinic." Patient has Coumadin clinic visit 6/9 to arrange. Per my discussion with Dr. Excell Seltzer, he has cleared the patient to hold aspirin as requested as well.   Will summarize recs once patient returns call.   Laurann Montana, PA-C 11/08/2018, 11:06 AM

## 2018-11-08 NOTE — Telephone Encounter (Signed)
Does not need to wait for monitor result

## 2018-11-09 NOTE — Telephone Encounter (Signed)
   Primary Cardiologist: Tonny Bollman, MD  Chart revisited as part of pre-operative protocol coverage. Patient returned phone call. Denies any new CP or SOB. Monitor in progress as previously mentioned. Given past medical history and time since last visit, based on ACC/AHA guidelines, [name] would be at acceptable risk for the planned procedure without further cardiovascular testing. Dr. Excell Seltzer has reviewed and feels he does not have to wait until monitor is resulted to have procedure. Per my discussion with Dr. Excell Seltzer yesterday, he has cleared the patient to hold aspirin as requested as well. Will defer to surgical team to relay final procedure instructions to patient.  Per pharmD, "Per office protocol, patient can holdwarfarinfor 5days prior to procedure. PatientWILLneed bridging with Lovenox (enoxaparin) around procedure. Bridging will be coordinated in the coumadin clinic." Patient has Coumadin clinic visit 6/9 to arrange. Patient is aware of this appt.   I will route this recommendation to the requesting party via Epic fax function and remove from pre-op pool.  Please call with questions.  Laurann Montana, PA-C 11/09/2018, 11:08 AM

## 2018-11-10 ENCOUNTER — Telehealth: Payer: Self-pay

## 2018-11-10 NOTE — Telephone Encounter (Signed)

## 2018-11-15 ENCOUNTER — Telehealth: Payer: Self-pay | Admitting: Licensed Clinical Social Worker

## 2018-11-15 ENCOUNTER — Telehealth: Payer: Self-pay | Admitting: Cardiovascular Disease

## 2018-11-15 NOTE — Telephone Encounter (Signed)
CSW referred to assist patient with financial needs for medications. Patient appears to be uninsured and CSW contacted patient with message left to discuss possible options for assistance. Raquel Sarna, [place], [place]

## 2018-11-15 NOTE — Telephone Encounter (Signed)
  Pt c/o swelling: STAT is pt has developed SOB within 24 hours  1) How much weight have you gained and in what time span? Almost 20 pounds, not sure about length of time  2) If swelling, where is the swelling located? Legs, ankles and feet  3) Are you currently taking a fluid pill? Yes, but he feels like they aren't making a lot of difference  4) Are you currently SOB? Had some SOB upon exertion  5) Do you have a log of your daily weights (if so, list)? no  6) Have you gained 3 pounds in a day or 5 pounds in a week? maybe  7) Have you traveled recently? No  Patient also states he has not been taking his medications has prescribed because he has been out of work and cannot afford to get them all.

## 2018-11-15 NOTE — Telephone Encounter (Signed)
The patient states he has had worsening swelling and weight gain (about 20 pounds) over several months. He states his RLE is more swollen than his LLE. He reports RLE is tighter and a little red than the LLE. He denies pain, but states BLE is tight and uncomfortable. His shoes do not fit. He also reports some SOB on exertion. He denies eating salt but states he eats [place] sausages at least 3 times a week for lunch. He does not elevate his legs when sitting. Educated him about high salt foods and encouraged him to avoid salt.  He states he has been out of work since October and was just recently placed on Disability. He estimates he has about 7-8 hundred dollars monthly of medications so he basically has been picking and choosing ones to take. He has been taking Lasix and Coumadin only. He states he cannot afford anything else at this time.  He is scheduled tomorrow for Lovenox bridging for a back injection with the Coumadin Clinic. Per Dr. Burt Knack, scheduled the patient for in-office evaluation with Cecilie Kicks tomorrow for evaluation prior to Coumadin appointment. The patient was grateful for call and agrees with treatment plan. The patient was COVID screened and answered "NO" to all questions.  Message sent to monitor technicians to upload recent monitor if available. Message sent to Social Worker Raquel Sarna) to see if assistance is available. Message sent to practice administrator asking about gift cards for medication assistance.

## 2018-11-15 NOTE — Telephone Encounter (Signed)
Left message to call back  

## 2018-11-15 NOTE — Progress Notes (Signed)
Cardiology Office Note   Date:  11/16/2018   ID:  [name], DOB 1960/07/09, MRN [id number]  PCP:  Leeroy Cha, MD  Cardiologist:  Dr. Burt Knack     Chief Complaint  Patient presents with  . Leg Swelling      History of Present Illness: [name] is a 58 y.o. male who presents for edema he was cleared for surgery back injection.  He is to have coumadin checked today for lovenox bridge in preparation for his injection.  Now with edema .  Has been eating vienna sausages.  This he does 3 X week.  Having issues payin for meds.  [name] has reached out to SW and they can help and we are giving [employer] gift cards to pt.       He has a hx of aortic valve disease and thoracic aortic aneurysm s/p TAA repair with mechanical aortic valve replacement and reimplantation of coronary arteries in 2006, paroxysmal atrial fibrillation, diastolic HF, hypertension, hyperlipidemia, severe OSA, morbid obesity, venous insufficiency with associated stasis.  He is on chronic anticoagulation with Warfarin.  He was last seen by Dr. Burt Knack in Sept 2019.  and Richardson Dopp 08/27/18  He had a wound on his right leg recently that bled significantly and he went to the emergency room.  It sounds as though he either had a bleeding varicosity or a ruptured capillary.  He tells me that he has a referral pending to vascular surgery.  Last nuc 3/20 was low risk.    He is for back injection but will need lovenox crossover and not sure he can pay for that.  His wt is up 20 lbs and his legs + edema.  His wounds due to psoriasis are healing.  No SOB.  No chest pain.  No lightheadedness.   He is frustrated with not being able to get help for financial issues.  Out of work since Oct.  Made to much to get medicaid.   [name] reached out to Education officer, museum and she is trying to set pt up with community health and wellness.      Past Medical History:  Diagnosis Date  . Ascending aortic aneurysm and dissection     2006 repaired  7 cm aneurysm  . Chest pain, atypical   . Coronary artery reimplantation   . Hyperlipidemia    Mixed  . Hypertension    Unspecified  . Nuclear stress test    Myoview 08/2018:  EF 58, no scar or ischemia; Low Risk  . Obesity   . Psoriasis   . S/P aortic valve and root replacement    HX of St. Jude  . Seizures ([place])   . Sleep apnea, obstructive     Past Surgical History:  Procedure Laterality Date  . Ascending aortic dissection aneurysm  12/06/2004   ok for MRI 1.5 or 3T Using a 27-mm St. Jude mechanical valve conduit with reimplantation of both coronary arteries  . CORNEAL TRANSPLANT     R   . KNEE SURGERY     R  . Replacement of aortic valve  12/06/2004     Current Outpatient Medications  Medication Sig Dispense Refill  . acetaminophen (TYLENOL) 325 MG tablet Take 2 tablets (650 mg total) by mouth every 6 (six) hours as needed. 30 tablet 0  . aspirin 81 MG tablet Take 81 mg by mouth daily.    . Cholecalciferol (VITAMIN D) 2000 units tablet Take 2,000 Units by mouth 2 (  two) times daily.    . ferrous sulfate 325 (65 FE) MG tablet Take 325 mg by mouth daily.    Marland Kitchen. lidocaine (LIDODERM) 5 % Place 1 patch onto the skin daily. Remove & Discard patch within 12 hours or as directed by MD 30 patch 0  . metoprolol succinate (TOPROL-XL) 50 MG 24 hr tablet Take 50 mg by mouth daily.     . Multiple Vitamins-Minerals (CENTRUM SILVER ADULT 50+) TABS 1 qd    . oxyCODONE-acetaminophen (PERCOCET/ROXICET) 5-325 MG tablet TAKE 1 TABLET BY MOUTH THREE TIMES A DAY AS NEEDED FOR PAIN    . potassium chloride (K-DUR) 10 MEQ tablet Take 1 tablet (10 mEq total) by mouth daily. 30 tablet 11  . pregabalin (LYRICA) 75 MG capsule Take 75 mg by mouth 2 (two) times daily.    . rosuvastatin (CRESTOR) 10 MG tablet Take 10 mg by mouth daily.     . vitamin C (ASCORBIC ACID) 500 MG tablet Take 500 mg by mouth daily.    Marland Kitchen. warfarin (COUMADIN) 5 MG tablet TAKE AS DIRECTED BY COUMADIN CLINIC 135 tablet  1   No current facility-administered medications for this visit.     Allergies:   Nsaids    Social History:  The patient  reports that he quit smoking about 28 years ago. His smoking use included cigarettes. He has a 1.80 pack-year smoking history. He has quit using smokeless tobacco. He reports that he does not drink alcohol or use drugs.   Family History:  The patient's family history includes Cervical cancer in his mother and sister; Coronary artery disease in his mother; Diabetes in his father and mother.    ROS:  General:no colds or fevers, + weight changes Skin:no rashes + ulcers on legs healing HEENT:no blurred vision, no congestion CV:see HPI PUL:see HPI GI:no diarrhea constipation or melena, no indigestion GU:no hematuria, no dysuria MS:no joint pain, no claudication Neuro:no syncope, no lightheadedness Endo:no diabetes, no thyroid disease  Wt Readings from Last 3 Encounters:  11/16/18 (!) 344 lb (156 kg)  08/30/18 (!) 327 lb (148.3 kg)  08/27/18 (!) 327 lb (148.3 kg)     PHYSICAL EXAM: VS:  BP 130/68   Pulse 63   Ht 6\' 2"  (1.88 m)   Wt (!) 344 lb (156 kg)   SpO2 95%   BMI 44.17 kg/m  , BMI Body mass index is 44.17 kg/m. General:Pleasant affect, NAD Skin:Warm and dry, brisk capillary refill HEENT:normocephalic, sclera clear, mucus membranes moist Neck:supple, no JVD, no bruits  Heart:S1S2 RRR without murmur, + click of aortic valve no gallup, rub or click Lungs:clear without rales, rhonchi, or wheezes GEX:BMWUAbd:soft, non tender, + BS, do not palpate liver spleen or masses Ext:2+ lower ext edema, 2+ pedal pulses, 2+ radial pulses Neuro:alert and oriented X 3, MAE, follows commands, + facial symmetry    EKG:  EKG is ordered today. The ekg ordered today demonstrates SR 1st degree AV block PR 230 ms, no St changes from prior.     Recent Labs: 05/30/2018: BUN 12; Creatinine, Ser 0.90; Potassium 4.0; Sodium 142 08/27/2018: Hemoglobin 9.3; NT-Pro BNP 39; Platelets  261    Lipid Panel No results found for: CHOL, TRIG, HDL, CHOLHDL, VLDL, LDLCALC, LDLDIRECT     Other studies Reviewed: Additional studies/ records that were reviewed today include: . Echo 01/20/18 Severe LVH, EF 60-65, no RWMA, Gr 1 DD, mechanical AVR functioning normally (mean 21), mod LAE, severe RAE, trivial TR, PASP 32  Echo 03/18/15 Severe  LVH, EF 50-55, no RWMA, normally functioning AVR, mod LAE, mild RAE  Nuc study 08/31/18 Study Highlights    Nuclear stress EF: 58%. The left ventricular ejection fraction is normal (55-65%). The LV appears to be mildly dilated.  There was no ST segment deviation noted during stress.  This is a low risk study. Threre is no evidence of ischemia and no evidence of infarction. There is mild LV dilitation with normal LV function      ASSESSMENT AND PLAN:  1.  Lower ext edema due to poor diet, with vienna sausage, bacon, sausage and nabs.  Not sure all 20 lbs is due to fluid.  We discussed diet, low salt, has not been on K+ will check BMP today.  Decrease salt and increase lasix to 80 mg for 2 days then back to 40 mg daily.  [facility] on [address] and were able to give $25 gift card for [facility].  No significant DOE.  EKG stable.    2.  Back pain will need injection.  With finances he may not be able to afford lovenox crossover.  First he needs to be back on his meds.  Will see him back in 2 weeks. And decide about injection, he will have coumadin check then as well.   3.  AVR with mechanical valve  On coumadin, would need coumadin lovenox crossover.  Normal function by echo 01/2018  4.  PAF maintaining SR and already on coumadin  5.  HTN  Stable continue meds.   6.  OSA continue CPAP   Current medicines are reviewed with the patient today.  The patient Has no concerns regarding medicines.  The following changes have been made:  See above Labs/ tests ordered today include:see above  Disposition:   FU:  see above   Signed, Nada BoozerLaura Gracee Ratterree, NP  11/16/2018 12:11 PM    [facility] [address], [place], [place]  [address]/ [address] [place], [place] Phone: [phone]; Fax: [phone]  [phone]

## 2018-11-16 ENCOUNTER — Other Ambulatory Visit: Payer: Self-pay

## 2018-11-16 ENCOUNTER — Ambulatory Visit (INDEPENDENT_AMBULATORY_CARE_PROVIDER_SITE_OTHER): Payer: Self-pay | Admitting: Pharmacist

## 2018-11-16 ENCOUNTER — Encounter: Payer: Self-pay | Admitting: Cardiology

## 2018-11-16 ENCOUNTER — Ambulatory Visit (INDEPENDENT_AMBULATORY_CARE_PROVIDER_SITE_OTHER): Payer: Self-pay | Admitting: Cardiology

## 2018-11-16 VITALS — BP 130/68 | HR 63 | Ht 74.0 in | Wt 344.0 lb

## 2018-11-16 DIAGNOSIS — R600 Localized edema: Secondary | ICD-10-CM

## 2018-11-16 DIAGNOSIS — Z952 Presence of prosthetic heart valve: Secondary | ICD-10-CM

## 2018-11-16 DIAGNOSIS — Z5181 Encounter for therapeutic drug level monitoring: Secondary | ICD-10-CM

## 2018-11-16 DIAGNOSIS — I480 Paroxysmal atrial fibrillation: Secondary | ICD-10-CM

## 2018-11-16 DIAGNOSIS — G4733 Obstructive sleep apnea (adult) (pediatric): Secondary | ICD-10-CM

## 2018-11-16 DIAGNOSIS — I10 Essential (primary) hypertension: Secondary | ICD-10-CM

## 2018-11-16 DIAGNOSIS — Z7901 Long term (current) use of anticoagulants: Secondary | ICD-10-CM

## 2018-11-16 DIAGNOSIS — M5441 Lumbago with sciatica, right side: Secondary | ICD-10-CM

## 2018-11-16 DIAGNOSIS — I359 Nonrheumatic aortic valve disorder, unspecified: Secondary | ICD-10-CM

## 2018-11-16 LAB — POCT INR: INR: 3.6 — AB (ref 2.0–3.0)

## 2018-11-16 MED ORDER — FUROSEMIDE 40 MG PO TABS: 40.0000 mg | ORAL_TABLET | Freq: Every day | ORAL | 3 refills | Status: DC

## 2018-11-16 MED ORDER — METOPROLOL SUCCINATE ER 50 MG PO TB24: 50.0000 mg | ORAL_TABLET | Freq: Every day | ORAL | 3 refills | Status: DC

## 2018-11-16 MED ORDER — POTASSIUM CHLORIDE ER 10 MEQ PO TBCR: 10.0000 meq | EXTENDED_RELEASE_TABLET | Freq: Every day | ORAL | 3 refills | Status: DC

## 2018-11-16 MED ORDER — PREGABALIN 75 MG PO CAPS: 75.0000 mg | ORAL_CAPSULE | Freq: Two times a day (BID) | ORAL | 0 refills | Status: DC

## 2018-11-16 MED ORDER — FERROUS SULFATE 325 (65 FE) MG PO TABS: 325.0000 mg | ORAL_TABLET | Freq: Every day | ORAL | 0 refills | Status: DC

## 2018-11-16 MED ORDER — ATORVASTATIN CALCIUM 20 MG PO TABS: 20.0000 mg | ORAL_TABLET | Freq: Every day | ORAL | 3 refills | Status: DC

## 2018-11-16 MED ORDER — VITAMIN D 50 MCG (2000 UT) PO TABS: 2000.0000 [IU] | ORAL_TABLET | Freq: Two times a day (BID) | ORAL | 0 refills | Status: AC

## 2018-11-16 NOTE — Patient Instructions (Signed)
Description   Take 1 tablet today, then continue taking 1.5 tablets daily except 2 tablets on Fridays.  Recheck in 2 weeks. Call us with any procedures or new medications [phone] & Fax number is [phone]

## 2018-11-16 NOTE — Patient Instructions (Addendum)
Medication Instructions:  Your physician has recommended you make the following change in your medication:   1. INCREASE LASIX TO 80 MG DAILY FOR TWO DAYS, THEN TAKE 40 MG DAILY.  2. STOP CRESTOR   3. START LIPITOR 20 MG DAILY. Refills have been sent in to your pharmacy for your Cardiac medications.  If you need a refill on your cardiac medications before your next appointment, please call your pharmacy.   Lab work: TODAY: BMET  If you have labs (blood work) drawn today and your tests are completely normal, you will receive your results only by: Marland Kitchen MyChart Message (if you have MyChart) OR . A paper copy in the mail If you have any lab test that is abnormal or we need to change your treatment, we will call you to review the results.  Testing/Procedures: NONE  Follow-Up: . Your physician recommends that you schedule a follow-up appointment in: 2 [place], NP   Any Other Special Instructions Will Be Listed Below (If Applicable).  1. CALL OUR OFFICE ON Friday TO LET us KNOW YOUR WEIGHT AND LET us KNOW IF YOU LEGS ARE GETTING BETTER.

## 2018-11-17 ENCOUNTER — Telehealth: Payer: Self-pay | Admitting: Licensed Clinical Social Worker

## 2018-11-17 LAB — BASIC METABOLIC PANEL
BUN/Creatinine Ratio: 14 (ref 10–24)
BUN: 11 mg/dL (ref 8–27)
CO2: 26 mmol/L (ref 20–29)
Calcium: 9.1 mg/dL (ref 8.6–10.2)
Chloride: 106 mmol/L (ref 96–106)
Creatinine, Ser: 0.8 mg/dL (ref 0.76–1.27)
GFR calc Af Amer: 112 mL/min/{1.73_m2} (ref >59)
GFR calc non Af Amer: 97 mL/min/{1.73_m2} (ref >59)
Glucose: 77 mg/dL (ref 65–99)
Potassium: 4.2 mmol/L (ref 3.5–5.2)
Sodium: 145 mmol/L — ABNORMAL HIGH (ref 134–144)

## 2018-11-17 NOTE — Telephone Encounter (Signed)
CSW contacted patient to follow up on medication assistance needed. CSW left message for return call. Raquel Sarna, [place], [place]'

## 2018-11-17 NOTE — Telephone Encounter (Signed)
Patient returned call and plans to move his prescriptions to [facility] as the prices per script with a Good Rx card are significantly lower at [facility] making it more affordable for patient. CSW will help with Patient Care Fund for this month to relive patient of the financial burden. Patient grateful for the assistance. Raquel Sarna, [place], [place]

## 2018-11-18 ENCOUNTER — Telehealth: Payer: Self-pay | Admitting: Cardiovascular Disease

## 2018-11-18 NOTE — Telephone Encounter (Signed)
Returned pt's call and he has been made aware of his lab results.  

## 2018-11-18 NOTE — Telephone Encounter (Signed)
-----   Message from Isaiah Serge, NP sent at 11/17/2018 11:06 PM EDT ----- Good news labs are stable.

## 2018-11-18 NOTE — Telephone Encounter (Signed)
New Message    Pt is returning call for results   Please call back  

## 2018-11-19 ENCOUNTER — Telehealth: Payer: Self-pay | Admitting: Cardiology

## 2018-11-19 ENCOUNTER — Telehealth: Payer: Self-pay | Admitting: Licensed Clinical Social Worker

## 2018-11-19 NOTE — Telephone Encounter (Signed)
Great!  So on Monday repeat lasix 80 mg daily  X 2 days then back to current dose.  Wt is down over 10 lbs though on different scales.   Thank you

## 2018-11-19 NOTE — Telephone Encounter (Signed)
New Message   Patient states that he was instructed to call and provide his weight. Patient weighs 331lbs. Patient states with the increase in his medication (lasix) that his legs are not as tight as they were.

## 2018-11-19 NOTE — Telephone Encounter (Signed)
Call placed to pt re: weight he sent in.  Pt has been advised, per Cecilie Kicks, NP, to repeat Lasix 80 mg X's 2 days, starting Monday, November 22, 2018 and after 2 days go back to 40 mg.  Pt verbalized understanding and was grateful for the follow-up.

## 2018-11-19 NOTE — Telephone Encounter (Signed)
CSW followed up with patient to make sure he obtained needed medications. Patient reports he had to have med changed from [facility] to [facility] due to cost savings. Patient reports he will pick up and denies any other concerns at this time. Raquel Sarna, [place], [place] '

## 2018-11-29 ENCOUNTER — Telehealth: Payer: Self-pay

## 2018-11-29 NOTE — Telephone Encounter (Signed)

## 2018-11-29 NOTE — Progress Notes (Signed)
Cardiology Office Note   Date:  12/06/2018   ID:  Lance BongoFrederick G Krach, DOB 1961-05-13, MRN [id number]  PCP:  Lorenda IshiharaVaradarajan, Rupashree, MD  Cardiologist:  Dr. Excell Seltzerooper, MD   Chief Complaint  Patient presents with   Follow-up   Edema   History of Present Illness: [name] is a 58 y.o. male with a history of aortic valve disease and thoracic aortic aneurysm s/p TAA repair with mechanical aortic valve replacement and reimplantation of coronary arteries in 2006, paroxysmalatrial fibrillation, diastolic HF, hypertension, hyperlipidemia, severe OSA, morbid obesity, venous insufficiency with associated stasis. He is on chronic anticoagulationwith Warfarin. He was last seen by Dr. Excell Seltzerooper in 02/2018 and Tereso NewcomerScott Weaver 08/27/18 He had a wound onhis right leg recently that bled significantly and he went to the emergency room. Per chart review, it appears this was a bleeding varicosity or a ruptured capillary, now with referral to [facility]. Last nuclear stress  3/20 was low risk.    He was last seen in the office 11/16/2018 as an acute visit for lower extremity edema. Per chart review, he was cleared for back injection and presented to clinic for Coumadin check and Lovenox bridge preparation.  He had complaints of lower extremity bilateral edema however had been eating Vienna sausages approximately 3-4 times per week. Additionally he was noted to be having issues paying for his medications.  Office staff reached out to social work in which [name] were given to the patient for assistance.  Last visit, his weight was noted to be up 20 pounds. He had no shortness of breath, no chest pain or lightheadedness.  He was frustrated with not being able to pay for his medications.  Diet modifications were discussed at his visit.  Lasix was increased to 80 mg x 2 days then to resume it back to 40 mg daily.    He was not cleared medically for his back injection given his significant weight gain.  Coumadin will be checked at that time.  Today he presents back to the office for follow up on edema and INR check. He reports that his diet has improved however states he continues to eat canned vegetables. He is no longer eating vienna sausages several times per week. His weight has been slowly improving but is not back to his baseline.  Weight today 335 with a baseline weight from 08/2018 of 327. He states he is able to get his shoes on his feet comfortably, improved from last office visit.  He denies anginal symptoms.  Continues to have mild shortness of breath with exertion, LE swelling.  Denies orthopnea, dizziness or syncope.    Past Medical History:  Diagnosis Date   Ascending aortic aneurysm and dissection    2006 repaired  7 cm aneurysm   Chest pain, atypical    Coronary artery reimplantation    Hyperlipidemia    Mixed   Hypertension    Unspecified   Nuclear stress test    Myoview 08/2018:  EF 58, no scar or ischemia; Low Risk   Obesity    Psoriasis    S/P aortic valve and root replacement    HX of St. Jude   Seizures (HCC)    Sleep apnea, obstructive     Past Surgical History:  Procedure Laterality Date   Ascending aortic dissection aneurysm  12/06/2004   ok for MRI 1.5 or 3T Using a 27-mm St. Jude mechanical valve conduit with reimplantation of both coronary arteries  CORNEAL TRANSPLANT     R    KNEE SURGERY     R   Replacement of aortic valve  12/06/2004     Current Outpatient Medications  Medication Sig Dispense Refill   acetaminophen (TYLENOL) 325 MG tablet Take 2 tablets (650 mg total) by mouth every 6 (six) hours as needed. 30 tablet 0   aspirin 81 MG tablet Take 81 mg by mouth daily.     atorvastatin (LIPITOR) 20 MG tablet Take 1 tablet (20 mg total) by mouth daily. 90 tablet 3   Cholecalciferol (VITAMIN D) 50 MCG (2000 UT) tablet Take 1 tablet (2,000 Units total) by mouth 2 (two) times daily. 180 tablet 0   ferrous sulfate 325 (65 FE) MG  tablet Take 1 tablet (325 mg total) by mouth daily. 90 tablet 0   lidocaine (LIDODERM) 5 % Place 1 patch onto the skin daily. Remove & Discard patch within 12 hours or as directed by MD 30 patch 0   metoprolol succinate (TOPROL-XL) 50 MG 24 hr tablet Take 1 tablet (50 mg total) by mouth daily. 90 tablet 3   Multiple Vitamins-Minerals (CENTRUM SILVER ADULT 50+) TABS 1 qd     oxyCODONE-acetaminophen (PERCOCET/ROXICET) 5-325 MG tablet TAKE 1 TABLET BY MOUTH THREE TIMES A DAY AS NEEDED FOR PAIN     potassium chloride (K-DUR) 10 MEQ tablet Take 1 tablet (10 mEq total) by mouth daily. 90 tablet 3   pregabalin (LYRICA) 75 MG capsule Take 1 capsule (75 mg total) by mouth 2 (two) times daily. 30 capsule 0   vitamin C (ASCORBIC ACID) 500 MG tablet Take 500 mg by mouth daily.     warfarin (COUMADIN) 5 MG tablet TAKE AS DIRECTED BY COUMADIN CLINIC 135 tablet 1   furosemide (LASIX) 40 MG tablet Take 1 tablet (40 mg total) by mouth 2 (two) times daily. 60 tablet 3   No current facility-administered medications for this visit.     Allergies:   Nsaids    Social History:  The patient  reports that he quit smoking about 29 years ago. His smoking use included cigarettes. He has a 1.80 pack-year smoking history. He has quit using smokeless tobacco. He reports that he does not drink alcohol or use drugs.   Family History:  The patient's family history includes Cervical cancer in his mother and sister; Coronary artery disease in his mother; Diabetes in his father and mother.    ROS:  Please see the history of present illness.   Otherwise, review of systems are positive for none.   All other systems are reviewed and negative.    PHYSICAL EXAM: VS:  BP 128/74    Pulse 66    Ht 6\' 2"  (1.88 m)    Wt (!) 335 lb (152 kg)    SpO2 96%    BMI 43.01 kg/m  , BMI Body mass index is 43.01 kg/m.   General: Obese,  NAD Skin: Warm, dry, intact  Neck: Negative for carotid bruits. No JVD Lungs:Clear to ausculation  bilaterally. No wheezes, rales, or rhonchi. Breathing is unlabored. Cardiovascular: RRR with S1 S2. No murmurs, rubs, gallops, or LV heave appreciated. Extremities: 2+ BLE edema. No clubbing or cyanosis. DP/PT pulses 2+ bilaterally Neuro: Alert and oriented. No focal deficits. No facial asymmetry. MAE spontaneously. Psych: Responds to questions appropriately with normal affect.     EKG:  EKG is not ordered today.   Recent Labs: 08/27/2018: Hemoglobin 9.3; NT-Pro BNP 39; Platelets 261 11/16/2018: BUN 11;  Creatinine, Ser 0.80; Potassium 4.2; Sodium 145    Lipid Panel No results found for: CHOL, TRIG, HDL, CHOLHDL, VLDL, LDLCALC, LDLDIRECT    Wt Readings from Last 3 Encounters:  12/06/18 (!) 335 lb (152 kg)  11/16/18 (!) 344 lb (156 kg)  08/30/18 (!) 327 lb (148.3 kg)    Other studies Reviewed: Additional studies/ records that were reviewed today include:   Echo 01/20/18 Severe LVH, EF 60-65, no RWMA, Gr 1 DD, mechanical AVR functioning normally (mean 21), mod LAE, severe RAE, trivial TR, PASP 32  Echo 03/18/15 Severe LVH, EF 50-55, no RWMA, normally functioning AVR, mod LAE, mild RAE  Nuc study 08/31/18 Study Highlights    Nuclear stress EF: 58%. The left ventricular ejection fraction is normal (55-65%). The LV appears to be mildly dilated.  There was no ST segment deviation noted during stress.  This is a low risk study. Threre is no evidence of ischemia and no evidence of infarction. There is mild LV dilitation with normal LV function    ASSESSMENT AND PLAN:  1.  Lower extremity edema: -Seen in the office 11/16/2018 for significant lower extremity edema with a 20 pound weight gain likely due to diet indiscretions.  -Lasix was increased to 80 mg for 2 days then to resume 40 mg daily.  This regimen was repeated twice given elevated weight -Last creatinine stable at 0.8 -Electrolytes within normal limits -Weight improved today to 335lb however is not at his baseline from  08/2018 at 327lb -Continues to have moderate LE edema and mild exertional shortness of breath above his baseline -We will increase Lasix to 40 mg twice daily and see him back in the office for close follow-up -Discussed plan with Coumadin clinic pharmacist who states once he is cleared, hopefully next office visit, they will need to know when procedure is scheduled and recheck his INR approximately 3 days prior to begin Lovenox bridge.  Given his weight he will need twice per day dosing  2.  Pain with need for injection: -With financial concerns, may not be able to afford Lovenox crossover however will need to be back on his diuretic regimen prior to injection. -Coumadin check today -Once patient is cleared and injection scheduled, will need to be seen in INR clinic 3 days prior to procedure to begin Lovenox bridging  3.  AVR most mechanical valve: -On chronic Coumadin therapy -We will need Coumadin/Lovenox crossover for back injection>> financial concerns -Stable per last echocardiogram 01/2018  4.  PAF: -Last EKG, NSR -Regular rate today -Anticoagulated with chronic Coumadin therapy secondary to above   5.  Hypertension: -Stable, 128/74 -Continue current regimen    Current medicines are reviewed at length with the patient today.  The patient does not have concerns regarding medicines.  The following changes have been made: Increase Lasix to 40 mg twice daily  Labs/ tests ordered today include: None  No orders of the defined types were placed in this encounter.   Disposition:   FU with APP in 2 weeks  Signed, Georgie ChardJill Kharson Rasmusson, NP  12/06/2018 10:11 AM    [facility] [address], [place], [place]  [address] Phone: [phone]; Fax: [phone]

## 2018-12-03 ENCOUNTER — Telehealth: Payer: Self-pay | Admitting: Cardiology

## 2018-12-03 NOTE — Telephone Encounter (Signed)

## 2018-12-06 ENCOUNTER — Ambulatory Visit (INDEPENDENT_AMBULATORY_CARE_PROVIDER_SITE_OTHER): Payer: Self-pay | Admitting: Cardiology

## 2018-12-06 ENCOUNTER — Other Ambulatory Visit: Payer: Self-pay

## 2018-12-06 ENCOUNTER — Encounter: Payer: Self-pay | Admitting: Cardiology

## 2018-12-06 ENCOUNTER — Ambulatory Visit (INDEPENDENT_AMBULATORY_CARE_PROVIDER_SITE_OTHER): Payer: Self-pay | Admitting: *Deleted

## 2018-12-06 VITALS — BP 128/74 | HR 66 | Ht 74.0 in | Wt 335.0 lb

## 2018-12-06 DIAGNOSIS — I359 Nonrheumatic aortic valve disorder, unspecified: Secondary | ICD-10-CM

## 2018-12-06 DIAGNOSIS — I10 Essential (primary) hypertension: Secondary | ICD-10-CM

## 2018-12-06 DIAGNOSIS — Z7901 Long term (current) use of anticoagulants: Secondary | ICD-10-CM

## 2018-12-06 DIAGNOSIS — R600 Localized edema: Secondary | ICD-10-CM

## 2018-12-06 DIAGNOSIS — Z952 Presence of prosthetic heart valve: Secondary | ICD-10-CM

## 2018-12-06 DIAGNOSIS — I5032 Chronic diastolic (congestive) heart failure: Secondary | ICD-10-CM

## 2018-12-06 DIAGNOSIS — Z5181 Encounter for therapeutic drug level monitoring: Secondary | ICD-10-CM

## 2018-12-06 LAB — POCT INR: INR: 4.4 — AB (ref 2.0–3.0)

## 2018-12-06 MED ORDER — FUROSEMIDE 40 MG PO TABS: 40.0000 mg | ORAL_TABLET | Freq: Two times a day (BID) | ORAL | 3 refills | Status: DC

## 2018-12-06 NOTE — Patient Instructions (Addendum)
  Description    Hold tonight's dose, then continue taking 1.5 tablets daily except 2 tablets on Fridays.  Recheck in 2 weeks - pending ESI,(?) needs Lovenox bridge. Call us with any procedures or new medications [phone] & Fax number is [phone]

## 2018-12-06 NOTE — Patient Instructions (Addendum)
Medication Instructions:  Your physician has recommended you make the following change in your medication:  1.  INCREASE the Lasix to 40 mg taking 1 tablet twice a day  If you need a refill on your cardiac medications before your next appointment, please call your pharmacy.   Lab work: None ordered  If you have labs (blood work) drawn today and your tests are completely normal, you will receive your results only by: Marland Kitchen MyChart Message (if you have MyChart) OR . A paper copy in the mail If you have any lab test that is abnormal or we need to change your treatment, we will call you to review the results.  Testing/Procedures: None ordered  Follow-Up: At [facility], you and your health needs are our priority.  As part of our continuing mission to provide you with exceptional heart care, we have created designated Provider Care Teams.  These Care Teams include your primary Cardiologist (physician) and Advanced Practice Providers (APPs -  Physician Assistants and Nurse Practitioners) who all work together to provide you with the care you need, when you need it. You have been scheduled a in-office follow-up appointment 12/20/2018 at 8:45.  Please arrive 15 mins early to this appointment.\  Any Other Special Instructions Will Be Listed Below (If Applicable).

## 2018-12-07 NOTE — Progress Notes (Deleted)
Cardiology Office Note   Date:  12/07/2018   ID:  [name], DOB 1960-11-07, MRN [id number]  PCP:  [name], Rupashree, MD  Cardiologist:  Dr. Excell Seltzerooper, MD   No chief complaint on file.   History of Present Illness: [name] is a 58 y.o. male with a history of aortic valve disease and thoracic aortic aneurysm s/p TAA repair with mechanical aortic valve replacement and reimplantation of coronary arteries in 2006, paroxysmalatrial fibrillation, diastolic HF, hypertension, hyperlipidemia, severe OSA, morbid obesity, venous insufficiency with associated stasis. He is on chronic anticoagulationwith Warfarin. He was last seen by Dr. Excell [name] in 02/2018 and Tereso NewcomerScott [name] 3/20/20He had a wound onhis right leg recently that bled significantly and he went to the emergency room. Per chart review, it appears this was a bleeding varicosity or a ruptured capillary, now with referral to [facility]. Last nuclear stress  3/20 was low risk.  He was seen in the office 11/16/2018 as an acute visit for lower extremity edema. Per chart review, he was cleared for back injection and presented to clinic for Coumadin check and Lovenox bridge preparation.  He had complaints of lower extremity bilateral edema however had been eating Vienna sausages approximately 3-4 times per week. Additionally he was noted to be having issues paying for his medications.  Office staff reached out to social work in which [name] were given to the patient for assistance.  Last visit, his weight was noted to be up 20 pounds. He had no shortness of breath, no chest pain or lightheadedness.  He was frustrated with not being able to pay for his medications.  Diet modifications were discussed at his visit.  Lasix was increased to 80 mg x 2 days then to resume it back to 40 mg daily.    He was not cleared medically for his back injection given his significant weight gain. Coumadin will be checked at that time.   He was then seen on close follow up with me 12/06/2018. He reported that his diet has improved however states he continues to eat canned vegetables. He was no longer eating vienna sausages several times per week. His weight was noted to be slowly improving but is not back to his baseline. Weight was 335lb with a baseline weight from 08/2018 of 327lb. He stated he was able to get his shoes on his feet comfortably, improved from last office visit. He denied anginal symptoms.  Continued to have mild shortness of breath with exertion, LE swelling. Denied orthopnea, dizziness or syncope.   Today he presents for close follow up and    1.  Lower extremity edema: -Seen in the office 11/16/2018 for significant lower extremity edema with a 20 pound weight gain likely due to diet indiscretions.  -Lasix was increased to 80 mg for 2 days then to resume 40 mg daily.  This regimen was repeated twice given elevated weight -Last creatinine stable at 0.8 -Electrolytes within normal limits -Weight improved today to 335lb however is not at his baseline from 08/2018 at 327lb -Continues to have moderate LE edema and mild exertional shortness of breath above his baseline -We will increase Lasix to 40 mg twice daily and see him back in the office for close follow-up -Discussed plan with Coumadin clinic pharmacist who states once he is cleared, hopefully next office visit, they will need to know when procedure is scheduled and recheck his INR approximately 3 days prior to begin Lovenox bridge.  Given his  weight he will need twice per day dosing  2.  Pain with need for injection: -With financial concerns, may not be able to afford Lovenox crossover however will need to be back on his diuretic regimen prior to injection. -Coumadin check today -Once patient is cleared and injection scheduled, will need to be seen in INR clinic 3 days prior to procedure to begin Lovenox bridging  3.  AVR most mechanical valve: -On chronic  Coumadin therapy -We will need Coumadin/Lovenox crossover for back injection>> financial concerns -Stable per last echocardiogram 01/2018  4.  PAF: -Last EKG, NSR -Regular rate today -Anticoagulated with chronic Coumadin therapy secondary to above   5.  Hypertension: -Stable, 128/74 -Continue current regimen     Past Medical History:  Diagnosis Date  . Ascending aortic aneurysm and dissection    2006 repaired  7 cm aneurysm  . Chest pain, atypical   . Coronary artery reimplantation   . Hyperlipidemia    Mixed  . Hypertension    Unspecified  . Nuclear stress test    Myoview 08/2018:  EF 58, no scar or ischemia; Low Risk  . Obesity   . Psoriasis   . S/P aortic valve and root replacement    HX of St. Jude  . Seizures (HCC)   . Sleep apnea, obstructive     Past Surgical History:  Procedure Laterality Date  . Ascending aortic dissection aneurysm  12/06/2004   ok for MRI 1.5 or 3T Using a 27-mm St. Jude mechanical valve conduit with reimplantation of both coronary arteries  . CORNEAL TRANSPLANT     R   . KNEE SURGERY     R  . Replacement of aortic valve  12/06/2004     Current Outpatient Medications  Medication Sig Dispense Refill  . acetaminophen (TYLENOL) 325 MG tablet Take 2 tablets (650 mg total) by mouth every 6 (six) hours as needed. 30 tablet 0  . aspirin 81 MG tablet Take 81 mg by mouth daily.    Marland Kitchen. atorvastatin (LIPITOR) 20 MG tablet Take 1 tablet (20 mg total) by mouth daily. 90 tablet 3  . Cholecalciferol (VITAMIN D) 50 MCG (2000 UT) tablet Take 1 tablet (2,000 Units total) by mouth 2 (two) times daily. 180 tablet 0  . ferrous sulfate 325 (65 FE) MG tablet Take 1 tablet (325 mg total) by mouth daily. 90 tablet 0  . furosemide (LASIX) 40 MG tablet Take 1 tablet (40 mg total) by mouth 2 (two) times daily. 60 tablet 3  . lidocaine (LIDODERM) 5 % Place 1 patch onto the skin daily. Remove & Discard patch within 12 hours or as directed by MD 30 patch 0  .  metoprolol succinate (TOPROL-XL) 50 MG 24 hr tablet Take 1 tablet (50 mg total) by mouth daily. 90 tablet 3  . Multiple Vitamins-Minerals (CENTRUM SILVER ADULT 50+) TABS 1 qd    . oxyCODONE-acetaminophen (PERCOCET/ROXICET) 5-325 MG tablet TAKE 1 TABLET BY MOUTH THREE TIMES A DAY AS NEEDED FOR PAIN    . potassium chloride (K-DUR) 10 MEQ tablet Take 1 tablet (10 mEq total) by mouth daily. 90 tablet 3  . pregabalin (LYRICA) 75 MG capsule Take 1 capsule (75 mg total) by mouth 2 (two) times daily. 30 capsule 0  . vitamin C (ASCORBIC ACID) 500 MG tablet Take 500 mg by mouth daily.    Marland Kitchen. warfarin (COUMADIN) 5 MG tablet TAKE AS DIRECTED BY COUMADIN CLINIC 135 tablet 1   No current facility-administered medications for this  visit.     Allergies:   Nsaids    Social History:  The patient  reports that he quit smoking about 29 years ago. His smoking use included cigarettes. He has a 1.80 pack-year smoking history. He has quit using smokeless tobacco. He reports that he does not drink alcohol or use drugs.   Family History:  The patient's ***family history includes Cervical cancer in his mother and sister; Coronary artery disease in his mother; Diabetes in his father and mother.    ROS:  Please see the history of present illness.   Otherwise, review of systems are positive for {NONE DEFAULTED:18576::"none"}.   All other systems are reviewed and negative.    PHYSICAL EXAM: VS:  There were no vitals taken for this visit. , BMI There is no height or weight on file to calculate BMI. GEN: Well nourished, well developed, in no acute distress HEENT: normal Neck: no JVD, carotid bruits, or masses Cardiac: ***RRR; no murmurs, rubs, or gallops,no edema  Respiratory:  clear to auscultation bilaterally, normal work of breathing GI: soft, nontender, nondistended, + BS MS: no deformity or atrophy Skin: warm and dry, no rash Neuro:  Strength and sensation are intact Psych: euthymic mood, full affect   EKG:   EKG {ACTION; IS/IS ERX:54008676} ordered today. The ekg ordered today demonstrates ***   Recent Labs: 08/27/2018: Hemoglobin 9.3; NT-Pro BNP 39; Platelets 261 11/16/2018: BUN 11; Creatinine, Ser 0.80; Potassium 4.2; Sodium 145    Lipid Panel No results found for: CHOL, TRIG, HDL, CHOLHDL, VLDL, LDLCALC, LDLDIRECT    Wt Readings from Last 3 Encounters:  12/06/18 (!) 335 lb (152 kg)  11/16/18 (!) 344 lb (156 kg)  08/30/18 (!) 327 lb (148.3 kg)      Other studies Reviewed: Additional studies/ records that were reviewed today include:   Echo 01/20/18 Severe LVH, EF 60-65, no RWMA, Gr 1 DD, mechanical AVR functioning normally (mean 21), mod LAE, severe RAE, trivial TR, PASP 32  Echo 03/18/15 Severe LVH, EF 50-55, no RWMA, normally functioning AVR, mod LAE, mild RAE  Nuc study 08/31/18 Study Highlights    Nuclear stress EF: 58%. The left ventricular ejection fraction is normal (55-65%). The LV appears to be mildly dilated.  There was no ST segment deviation noted during stress.  This is a low risk study. Threre is no evidence of ischemia and no evidence of infarction. There is mild LV dilitation with normal LV function      ASSESSMENT AND PLAN:  1.  ***   Current medicines are reviewed at length with the patient today.  The patient {ACTIONS; HAS/DOES NOT HAVE:19233} concerns regarding medicines.  The following changes have been made:  {PLAN; NO CHANGE:13088:s}  Labs/ tests ordered today include: *** No orders of the defined types were placed in this encounter.    Disposition:   FU with *** in {gen number 1-95:093267} {Days to years:10300}  Signed, Kathyrn Drown, NP  12/07/2018 12:11 PM    [facility] [place], [place], [place]  [address] Phone: [phone]; Fax: [phone]

## 2018-12-08 ENCOUNTER — Telehealth: Payer: Self-pay | Admitting: Licensed Clinical Social Worker

## 2018-12-08 NOTE — Telephone Encounter (Signed)
CSW received message from patient regarding medication assistance. CSW returned call and message left. Raquel Sarna, [place], [place]

## 2018-12-13 ENCOUNTER — Telehealth: Payer: Self-pay

## 2018-12-13 NOTE — Telephone Encounter (Signed)

## 2018-12-17 ENCOUNTER — Telehealth: Payer: Self-pay | Admitting: Cardiology

## 2018-12-17 NOTE — Telephone Encounter (Signed)

## 2018-12-20 ENCOUNTER — Ambulatory Visit: Payer: Self-pay | Admitting: Cardiology

## 2018-12-24 ENCOUNTER — Telehealth: Payer: Self-pay | Admitting: Cardiology

## 2018-12-24 NOTE — Telephone Encounter (Signed)

## 2018-12-26 NOTE — Progress Notes (Deleted)
Cardiology Office Note:    Date:  12/26/2018   ID:  [name], DOB 03-Sep-1960, MRN [id number]  PCP:  Lorenda IshiharaVaradarajan, Rupashree, MD  Cardiologist:  Tonny BollmanMichael Cooper, MD  Referring MD: Lorenda IshiharaVaradarajan, Rupashree,*   No chief complaint on file. ***  History of Present Illness:    [name] is a 58 y.o. male with a past medical history significant for aortic valve disease and thoracic aortic aneurysm s/p TAA repair with mechanical aortic valve placement and reimplantation of coronary arteries in 2006, paroxysmal atrial fibrillation, diastolic heart failure, hypertension, hyperlipidemia, severe OSA, morbid obesity, venous insufficiency with associated stasis.  He is on chronic anticoagulation with warfarin.  He had recent bleeding leg varicosity and ruptured capillary.  Last nuclear stress test in 08/2018 was low risk.  He had complaints of palpitations and cardiac monitor in May-June showed sinus rhythm with avg rate of 67 bpm. There was a 30 minute run of atrial tachycardia with abrupt onset and offset, rate of 100 bpm. There was no sustained ventricular arrhythmia, pathologic pauses or marked bradycardia. Per Dr. Excell Seltzerooper, Depending on degree of symptoms, could continue current Rx with Toprol XL or increase dose if frequent palpitations.   He was last seen in the office on 12/06/2018 by Georgie ChardJill McDaniel, NP.  He had been having issues with bilateral lower extremity edema and dietary indiscretions as well as issues paying for his medications.  At this appointment the patient reported improvement in his diet, no longer eating Vienna sausages.  His Lasix had been increased to 80 mg for 2 days and then back to 40 mg.  His weight was slowly improving but not yet to baseline.  His weight was 335 pounds which was up from his usual 327 lbs.  His Lasix was increased to 40 mg twice daily.  [name] is here today for close follow up. He is also planned for lumbar epidural injection and will need to  have Lovenox bridging while off warfarin for 5 days.     Past Medical History:  Diagnosis Date  . Ascending aortic aneurysm and dissection    2006 repaired  7 cm aneurysm  . Chest pain, atypical   . Coronary artery reimplantation   . Hyperlipidemia    Mixed  . Hypertension    Unspecified  . Nuclear stress test    Myoview 08/2018:  EF 58, no scar or ischemia; Low Risk  . Obesity   . Psoriasis   . S/P aortic valve and root replacement    HX of St. Jude  . Seizures (HCC)   . Sleep apnea, obstructive     Past Surgical History:  Procedure Laterality Date  . Ascending aortic dissection aneurysm  12/06/2004   ok for MRI 1.5 or 3T Using a 27-mm St. Jude mechanical valve conduit with reimplantation of both coronary arteries  . CORNEAL TRANSPLANT     R   . KNEE SURGERY     R  . Replacement of aortic valve  12/06/2004    Current Medications: No outpatient medications have been marked as taking for the 12/27/18 encounter (Appointment) with Berton BonHammond, Brighton Delio, NP.     Allergies:   Nsaids   Social History   Socioeconomic History  . Marital status: Married    Spouse name: Not on file  . Number of children: 0  . Years of education: 8412  . Highest education level: Not on file  Occupational History  . Occupation: MIXER/PACKER    Employer: [employer]  Comment: works at [employer]- inhales vapors  Social Needs  . Financial resource strain: Not on file  . Food insecurity    Worry: Not on file    Inability: Not on file  . Transportation needs    Medical: Not on file    Non-medical: Not on file  Tobacco Use  . Smoking status: Former Smoker    Packs/day: 0.10    Years: 18.00    Pack years: 1.80    Types: Cigarettes    Quit date: 12/07/1989    Years since quitting: 29.0  . Smokeless tobacco: Former Network engineer and Sexual Activity  . Alcohol use: No    Comment: Quit 1991  . Drug use: No  . Sexual activity: Not on file  Lifestyle  . Physical activity     Days per week: Not on file    Minutes per session: Not on file  . Stress: Not on file  Relationships  . Social Herbalist on phone: Not on file    Gets together: Not on file    Attends religious service: Not on file    Active member of club or organization: Not on file    Attends meetings of clubs or organizations: Not on file    Relationship status: Not on file  Other Topics Concern  . Not on file  Social History Narrative   Married   No regular exercise      Patient drinks 2 cups of coffee a day    Patient is right handed.      Family History: The patient's family history includes Cervical cancer in his mother and sister; Coronary artery disease in his mother; Diabetes in his father and mother. ROS:   Please see the history of present illness.     All other systems reviewed and are negative.  EKGs/Labs/Other Studies Reviewed:    The following studies were reviewed today:  Cardiac monitor 10/24/18 Study Highlights  The basic rhythm is normal sinus with an average heart rate of 67 bpm. There are isolated PVCs present. There is an approximate 30-minute run of what appears to be atrial tachycardia with abrupt onset and offset with heart rate approximately 100 bpm with some variability There is no sustained ventricular arrhythmia, pathologic pauses, or marked bradycardia arrhythmias   Lexiscan myoview 08/31/2018 Study Highlights   Nuclear stress EF: 58%. The left ventricular ejection fraction is normal (55-65%). The LV appears to be mildly dilated.  There was no ST segment deviation noted during stress.  This is a low risk study. Threre is no evidence of ischemia and no evidence of infarction. There is mild LV dilitation with normal LV function     Echocardiogram 01/20/2018 Study Conclusions  - HPI and indications: Aortic valve replacement (50mm) St. Jude   mechanical valve. - Left ventricle: The cavity size was normal. There was severe   concentric  hypertrophy. Systolic function was normal. The   estimated ejection fraction was in the range of 60% to 65%. Wall   motion was normal; there were no regional wall motion   abnormalities. Doppler parameters are consistent with abnormal   left ventricular relaxation (grade 1 diastolic dysfunction). - Aortic valve: A mechanical prosthesis was present and functioning   normally. Peak velocity (S): 331 cm/s. Mean gradient (S): 21 mm   Hg. Valve area (VTI): 1.26 cm^2. Valve area (Vmax): 1.08 cm^2.   Valve area (Vmean): 1.05 cm^2. - Left atrium: The atrium was moderately  dilated. - Right atrium: The atrium was severely dilated. - Tricuspid valve: There was trivial regurgitation. - Pulmonary arteries: Systolic pressure was mildly increased. PA   peak pressure: 32 mm Hg (S).  EKG:  EKG is not ordered today.   Recent Labs: 08/27/2018: Hemoglobin 9.3; NT-Pro BNP 39; Platelets 261 11/16/2018: BUN 11; Creatinine, Ser 0.80; Potassium 4.2; Sodium 145   Recent Lipid Panel No results found for: CHOL, TRIG, HDL, CHOLHDL, VLDL, LDLCALC, LDLDIRECT  Physical Exam:    VS:  There were no vitals taken for this visit.    Wt Readings from Last 3 Encounters:  12/06/18 (!) 335 lb (152 kg)  11/16/18 (!) 344 lb (156 kg)  08/30/18 (!) 327 lb (148.3 kg)     Physical Exam***   ASSESSMENT:    No diagnosis found. PLAN:    In order of problems listed above:  Chronic diastolic heart failure -Recent lower extremity edema, lasix increased. Wt ***  Palpitations Cardiac monitor showed sinus rhythm with avg rate of 67 bpm and a 30 minute run of atrial tachycardia with abrupt onset and offset, rate of 100 bpm. There was no sustained ventricular arrhythmia, pathologic pauses or marked bradycardia. Per Dr. Excell Seltzerooper, Depending on degree of symptoms, could continue current Rx with Toprol XL or increase dose if frequent palpitations.   AVR s/p mechanical valve -On Chronic warfarin. Followed in our office.  -Stable  per last echo in 01/2018.   PAF -Maintaining sinus rhythm -Pt on chronic anticoagulation with warfarin due to mechanical AVR.   Hypertension  OSA  Clearance for lumbar epidural injection -Pt will need bridging with lovenox while holding warfarin for 5 days.  ?afford lovenox?   Medication Adjustments/Labs and Tests Ordered: Current medicines are reviewed at length with the patient today.  Concerns regarding medicines are outlined above. Labs and tests ordered and medication changes are outlined in the patient instructions below:  There are no Patient Instructions on file for this visit.   Signed, Berton BonJanine Clois Treanor, NP  12/26/2018 8:38 PM    [facility]

## 2018-12-27 ENCOUNTER — Ambulatory Visit: Payer: Self-pay | Admitting: Cardiology

## 2018-12-29 ENCOUNTER — Ambulatory Visit: Payer: Self-pay | Admitting: Physician Assistant

## 2019-01-05 ENCOUNTER — Other Ambulatory Visit: Payer: Self-pay

## 2019-01-05 ENCOUNTER — Ambulatory Visit (INDEPENDENT_AMBULATORY_CARE_PROVIDER_SITE_OTHER): Payer: No Typology Code available for payment source | Admitting: Internal Medicine

## 2019-01-05 ENCOUNTER — Encounter: Payer: Self-pay | Admitting: Internal Medicine

## 2019-01-05 VITALS — BP 120/70 | HR 62 | Temp 98.3°F | Ht 74.0 in | Wt 341.5 lb

## 2019-01-05 DIAGNOSIS — G4733 Obstructive sleep apnea (adult) (pediatric): Secondary | ICD-10-CM | POA: Diagnosis not present

## 2019-01-05 DIAGNOSIS — G894 Chronic pain syndrome: Secondary | ICD-10-CM

## 2019-01-05 DIAGNOSIS — K219 Gastro-esophageal reflux disease without esophagitis: Secondary | ICD-10-CM

## 2019-01-05 DIAGNOSIS — G589 Mononeuropathy, unspecified: Secondary | ICD-10-CM

## 2019-01-05 DIAGNOSIS — Z6841 Body Mass Index (BMI) 40.0 and over, adult: Secondary | ICD-10-CM

## 2019-01-05 DIAGNOSIS — E785 Hyperlipidemia, unspecified: Secondary | ICD-10-CM | POA: Diagnosis not present

## 2019-01-05 DIAGNOSIS — Z952 Presence of prosthetic heart valve: Secondary | ICD-10-CM

## 2019-01-05 DIAGNOSIS — Z7901 Long term (current) use of anticoagulants: Secondary | ICD-10-CM

## 2019-01-05 DIAGNOSIS — I10 Essential (primary) hypertension: Secondary | ICD-10-CM

## 2019-01-05 MED ORDER — PREGABALIN 75 MG PO CAPS: 75.0000 mg | ORAL_CAPSULE | Freq: Two times a day (BID) | ORAL | 2 refills | Status: DC

## 2019-01-05 NOTE — Progress Notes (Signed)
New Patient Office Visit     CC/Reason for Visit: Establish care, discuss chronic medical conditions Previous PCP: Dr. Maggie Schwalbe medicine Last Visit: Unknown  HPI: [name] is a 58 y.o. male who is coming in today for the above mentioned reasons.  He has changed insurance providers and his PCP is no longer covered which is what is prompting his transfer of care today.  His past medical history is extensive and significant for:  1.  History of hypertension  2.  Mechanical aortic valve replacement on chronic anticoagulation with Coumadin  3.  Heart failure with preserved ejection fraction.  All of his cardiology issues are followed by Dr. Burt Knack  4.  Obstructive sleep apnea on nightly CPAP  5.  GERD well-controlled off medications  6.  Morbid obesity  7.  Peripheral neuropathy well-controlled on Lyrica.  He has no acute complaints today.  I do note that since his last visit with cardiology on June 29 exactly 30 days ago, he has gained 6 pounds.  I have reviewed his diet with him today: he is still eating fast food at least once a day and canned vegetables are still a staple in his diet.  He is not short of breath, does not have chest pain or dyspnea on exertion.   Past Medical/Surgical History: Past Medical History:  Diagnosis Date   Ascending aortic aneurysm and dissection    2006 repaired  7 cm aneurysm   Chest pain, atypical    Coronary artery reimplantation    Hyperlipidemia    Mixed   Hypertension    Unspecified   Nuclear stress test    Myoview 08/2018:  EF 58, no scar or ischemia; Low Risk   Obesity    Psoriasis    S/P aortic valve and root replacement    HX of St. Jude   Seizures (HCC)    Sleep apnea, obstructive     Past Surgical History:  Procedure Laterality Date   Ascending aortic dissection aneurysm  12/06/2004   ok for MRI 1.5 or 3T Using a 27-mm St. Jude mechanical valve conduit with reimplantation of both coronary  arteries   CORNEAL TRANSPLANT     R    KNEE SURGERY     R   Replacement of aortic valve  12/06/2004    Social History:  reports that he quit smoking about 29 years ago. His smoking use included cigarettes. He has a 1.80 pack-year smoking history. He has quit using smokeless tobacco. He reports that he does not drink alcohol or use drugs.  Allergies: Allergies  Allergen Reactions   Nsaids Other (See Comments)    Told not to take meds-per patient    Family History:  Family History  Problem Relation Age of Onset   Cervical cancer Mother    Coronary artery disease Mother    Diabetes Mother    Diabetes Father    Cervical cancer Sister      Current Outpatient Medications:    acetaminophen (TYLENOL) 325 MG tablet, Take 2 tablets (650 mg total) by mouth every 6 (six) hours as needed., Disp: 30 tablet, Rfl: 0   aspirin 81 MG tablet, Take 81 mg by mouth daily., Disp: , Rfl:    atorvastatin (LIPITOR) 20 MG tablet, Take 1 tablet (20 mg total) by mouth daily., Disp: 90 tablet, Rfl: 3   Cholecalciferol (VITAMIN D) 50 MCG (2000 UT) tablet, Take 1 tablet (2,000 Units total) by mouth 2 (two) times daily., Disp:  180 tablet, Rfl: 0   ferrous sulfate 325 (65 FE) MG tablet, Take 1 tablet (325 mg total) by mouth daily., Disp: 90 tablet, Rfl: 0   furosemide (LASIX) 40 MG tablet, Take 1 tablet (40 mg total) by mouth 2 (two) times daily., Disp: 60 tablet, Rfl: 3   lidocaine (LIDODERM) 5 %, Place 1 patch onto the skin daily. Remove & Discard patch within 12 hours or as directed by MD, Disp: 30 patch, Rfl: 0   metoprolol succinate (TOPROL-XL) 50 MG 24 hr tablet, Take 1 tablet (50 mg total) by mouth daily., Disp: 90 tablet, Rfl: 3   Multiple Vitamins-Minerals (CENTRUM SILVER ADULT 50+) TABS, 1 qd, Disp: , Rfl:    oxyCODONE-acetaminophen (PERCOCET/ROXICET) 5-325 MG tablet, Dr Robet Leu - pain management, Disp: , Rfl:    potassium chloride (K-DUR) 10 MEQ tablet, Take 1 tablet (10 mEq total)  by mouth daily., Disp: 90 tablet, Rfl: 3   pregabalin (LYRICA) 75 MG capsule, Take 1 capsule (75 mg total) by mouth 2 (two) times daily., Disp: 60 capsule, Rfl: 2   vitamin C (ASCORBIC ACID) 500 MG tablet, Take 500 mg by mouth daily., Disp: , Rfl:    warfarin (COUMADIN) 5 MG tablet, TAKE AS DIRECTED BY COUMADIN CLINIC, Disp: 135 tablet, Rfl: 1  Review of Systems:  Constitutional: Denies fever, chills, diaphoresis, appetite change and fatigue.  HEENT: Denies photophobia, eye pain, redness, hearing loss, ear pain, congestion, sore throat, rhinorrhea, sneezing, mouth sores, trouble swallowing, neck pain, neck stiffness and tinnitus.   Respiratory: Denies SOB, DOE, cough, chest tightness,  and wheezing.   Cardiovascular: Denies chest pain, palpitations and leg swelling.  Gastrointestinal: Denies nausea, vomiting, abdominal pain, diarrhea, constipation, blood in stool and abdominal distention.  Genitourinary: Denies dysuria, urgency, frequency, hematuria, flank pain and difficulty urinating.  Endocrine: Denies: hot or cold intolerance, sweats, changes in hair or nails, polyuria, polydipsia. Musculoskeletal: Denies myalgias, back pain, joint swelling, arthralgias and gait problem.  Skin: Denies pallor, rash and wound.  Neurological: Denies dizziness, seizures, syncope, weakness, light-headedness, numbness and headaches.  Hematological: Denies adenopathy. Easy bruising, personal or family bleeding history  Psychiatric/Behavioral: Denies suicidal ideation, mood changes, confusion, nervousness, sleep disturbance and agitation    Physical Exam: Vitals:   01/05/19 1107  BP: 120/70  Pulse: 62  Temp: 98.3 F (36.8 C)  TempSrc: Oral  SpO2: 96%  Weight: (!) 341 lb 8 oz (154.9 kg)  Height: 6\' 2"  (1.88 m)   Body mass index is 43.85 kg/m.  Constitutional: NAD, calm, comfortable Eyes: PERRL, lids and conjunctivae normal ENMT: Mucous membranes are moist.  Neck: normal, supple, no masses, no  thyromegaly Respiratory: clear to auscultation bilaterally, no wheezing, no crackles. Normal respiratory effort. No accessory muscle use.  Cardiovascular: Regular rate and rhythm, no murmurs / rubs / gallops.  Metallic click.  2+ lower extremity edema. 2+ pedal pulses. No carotid bruits.  Abdomen: Obese, no tenderness, no masses palpated. No hepatosplenomegaly. Bowel sounds positive.  Musculoskeletal: no clubbing / cyanosis. No joint deformity upper and lower extremities. Good ROM, no contractures. Normal muscle tone.  Skin: no rashes, lesions, ulcers. No induration Neurologic: Grossly intact and nonfocal  psychiatric: Normal judgment and insight. Alert and oriented x 3. Normal mood.    Impression and Plan:  Hyperlipidemia, unspecified hyperlipidemia type -Most recent LDL was 88 in May 2020.  Class 3 severe obesity due to excess calories with serious comorbidity and body mass index (BMI) of 40.0 to 44.9 in adult [facility]) -Discussed healthy lifestyle,  including increased physical activity and better food choices to promote weight loss.  Obstructive sleep apnea -On nightly CPAP  Essential hypertension -Well-controlled  S/P aortic valve replacement Long term current use of anticoagulant -Chronically on Coumadin followed by cardiology.  Chronic pain syndrome -Has a pain management contract for oxycodone with Dr. Robet LeuBoden.  Gastro-esophageal reflux disease without esophagitis -Well-controlled not on medications.  Mononeuropathy  -Requesting Lyrica refill today.    Patient Instructions  -Nice meeting you today!!  -Please schedule follow up in 3-4 months.  -NO CANNED FOODS, limit fast food and soda.   DASH Eating Plan DASH stands for "Dietary Approaches to Stop Hypertension." The DASH eating plan is a healthy eating plan that has been shown to reduce high blood pressure (hypertension). It may also reduce your risk for type 2 diabetes, heart disease, and stroke. The DASH eating plan  may also help with weight loss. What are tips for following this plan?  General guidelines  Avoid eating more than 2,300 mg (milligrams) of salt (sodium) a day. If you have hypertension, you may need to reduce your sodium intake to 1,500 mg a day.  Limit alcohol intake to no more than 1 drink a day for nonpregnant women and 2 drinks a day for men. One drink equals 12 oz of beer, 5 oz of wine, or 1 oz of hard liquor.  Work with your health care provider to maintain a healthy body weight or to lose weight. Ask what an ideal weight is for you.  Get at least 30 minutes of exercise that causes your heart to beat faster (aerobic exercise) most days of the week. Activities may include walking, swimming, or biking.  Work with your health care provider or diet and nutrition specialist (dietitian) to adjust your eating plan to your individual calorie needs. Reading food labels   Check food labels for the amount of sodium per serving. Choose foods with less than 5 percent of the Daily Value of sodium. Generally, foods with less than 300 mg of sodium per serving fit into this eating plan.  To find whole grains, look for the word "whole" as the first word in the ingredient list. Shopping  Buy products labeled as "low-sodium" or "no salt added."  Buy fresh foods. Avoid canned foods and premade or frozen meals. Cooking  Avoid adding salt when cooking. Use salt-free seasonings or herbs instead of table salt or sea salt. Check with your health care provider or pharmacist before using salt substitutes.  Do not fry foods. Cook foods using healthy methods such as baking, boiling, grilling, and broiling instead.  Cook with heart-healthy oils, such as olive, canola, soybean, or sunflower oil. Meal planning  Eat a balanced diet that includes: ? 5 or more servings of fruits and vegetables each day. At each meal, try to fill half of your plate with fruits and vegetables. ? Up to 6-8 servings of whole  grains each day. ? Less than 6 oz of lean meat, poultry, or fish each day. A 3-oz serving of meat is about the same size as a deck of cards. One egg equals 1 oz. ? 2 servings of low-fat dairy each day. ? A serving of nuts, seeds, or beans 5 times each week. ? Heart-healthy fats. Healthy fats called Omega-3 fatty acids are found in foods such as flaxseeds and coldwater fish, like sardines, salmon, and mackerel.  Limit how much you eat of the following: ? Canned or prepackaged foods. ? Food that is high in  trans fat, such as fried foods. ? Food that is high in saturated fat, such as fatty meat. ? Sweets, desserts, sugary drinks, and other foods with added sugar. ? Full-fat dairy products.  Do not salt foods before eating.  Try to eat at least 2 vegetarian meals each week.  Eat more home-cooked food and less restaurant, buffet, and fast food.  When eating at a restaurant, ask that your food be prepared with less salt or no salt, if possible. What foods are recommended? The items listed may not be a complete list. Talk with your dietitian about what dietary choices are best for you. Grains Whole-grain or whole-wheat bread. Whole-grain or whole-wheat pasta. Brown rice. Orpah Cobbatmeal. Quinoa. Bulgur. Whole-grain and low-sodium cereals. Pita bread. Low-fat, low-sodium crackers. Whole-wheat flour tortillas. Vegetables Fresh or frozen vegetables (raw, steamed, roasted, or grilled). Low-sodium or reduced-sodium tomato and vegetable juice. Low-sodium or reduced-sodium tomato sauce and tomato paste. Low-sodium or reduced-sodium canned vegetables. Fruits All fresh, dried, or frozen fruit. Canned fruit in natural juice (without added sugar). Meat and other protein foods Skinless chicken or Malawiturkey. Ground chicken or Mal[place]urkey. Pork with fat trimmed off. Fish and seafood. Egg whites. Dried beans, peas, or lentils. Unsalted nuts, nut butters, and seeds. Unsalted canned beans. Lean cuts of beef with fat trimmed  off. Low-sodium, lean deli meat. Dairy Low-fat (1%) or fat-free (skim) milk. Fat-free, low-fat, or reduced-fat cheeses. Nonfat, low-sodium ricotta or cottage cheese. Low-fat or nonfat yogurt. Low-fat, low-sodium cheese. Fats and oils Soft margarine without trans fats. Vegetable oil. Low-fat, reduced-fat, or light mayonnaise and salad dressings (reduced-sodium). Canola, safflower, olive, soybean, and sunflower oils. Avocado. Seasoning and other foods Herbs. Spices. Seasoning mixes without salt. Unsalted popcorn and pretzels. Fat-free sweets. What foods are not recommended? The items listed may not be a complete list. Talk with your dietitian about what dietary choices are best for you. Grains Baked goods made with fat, such as croissants, muffins, or some breads. Dry pasta or rice meal packs. Vegetables Creamed or fried vegetables. Vegetables in a cheese sauce. Regular canned vegetables (not low-sodium or reduced-sodium). Regular canned tomato sauce and paste (not low-sodium or reduced-sodium). Regular tomato and vegetable juice (not low-sodium or reduced-sodium). Rosita FirePickles. Olives. Fruits Canned fruit in a light or heavy syrup. Fried fruit. Fruit in cream or butter sauce. Meat and other protein foods Fatty cuts of meat. Ribs. Fried meat. Tomasa BlaseBacon. Sausage. Bologna and other processed lunch meats. Salami. Fatback. Hotdogs. Bratwurst. Salted nuts and seeds. Canned beans with added salt. Canned or smoked fish. Whole eggs or egg yolks. Chicken or Malaw[place]urkey with skin. Dairy Whole or 2% milk, cream, and half-and-half. Whole or full-fat cream cheese. Whole-fat or sweetened yogurt. Full-fat cheese. Nondairy creamers. Whipped toppings. Processed cheese and cheese spreads. Fats and oils Butter. Stick margarine. Lard. Shortening. Ghee. Bacon fat. Tropical oils, such as coconut, palm kernel, or palm oil. Seasoning and other foods Salted popcorn and pretzels. Onion salt, garlic salt, seasoned salt, table salt, and  sea salt. Worcestershire sauce. Tartar sauce. Barbecue sauce. Teriyaki sauce. Soy sauce, including reduced-sodium. Steak sauce. Canned and packaged gravies. Fish sauce. Oyster sauce. Cocktail sauce. Horseradish that you find on the shelf. Ketchup. Mustard. Meat flavorings and tenderizers. Bouillon cubes. Hot sauce and Tabasco sauce. Premade or packaged marinades. Premade or packaged taco seasonings. Relishes. Regular salad dressings. Where to find more information:  National Heart, Lung, and Blood Institute: PopSteam.iswww.nhlbi.nih.gov  American Heart Association: www.heart.org Summary  The DASH eating plan is a healthy eating plan that  has been shown to reduce high blood pressure (hypertension). It may also reduce your risk for type 2 diabetes, heart disease, and stroke.  With the DASH eating plan, you should limit salt (sodium) intake to 2,300 mg a day. If you have hypertension, you may need to reduce your sodium intake to 1,500 mg a day.  When on the DASH eating plan, aim to eat more fresh fruits and vegetables, whole grains, lean proteins, low-fat dairy, and heart-healthy fats.  Work with your health care provider or diet and nutrition specialist (dietitian) to adjust your eating plan to your individual calorie needs. This information is not intended to replace advice given to you by your health care provider. Make sure you discuss any questions you have with your health care provider. Document Released: 05/15/2011 Document Revised: 05/08/2017 Document Reviewed: 05/19/2016 Elsevier Patient Education  2020 Elsevier Inc.      Chaya JanEstela Hernandez Acosta, MD  [facility]

## 2019-01-05 NOTE — Patient Instructions (Signed)
-Nice meeting you today!!  -Please schedule follow up in 3-4 months.  -NO CANNED FOODS, limit fast food and soda.   DASH Eating Plan DASH stands for "Dietary Approaches to Stop Hypertension." The DASH eating plan is a healthy eating plan that has been shown to reduce high blood pressure (hypertension). It may also reduce your risk for type 2 diabetes, heart disease, and stroke. The DASH eating plan may also help with weight loss. What are tips for following this plan?  General guidelines  Avoid eating more than 2,300 mg (milligrams) of salt (sodium) a day. If you have hypertension, you may need to reduce your sodium intake to 1,500 mg a day.  Limit alcohol intake to no more than 1 drink a day for nonpregnant women and 2 drinks a day for men. One drink equals 12 oz of beer, 5 oz of wine, or 1 oz of hard liquor.  Work with your health care provider to maintain a healthy body weight or to lose weight. Ask what an ideal weight is for you.  Get at least 30 minutes of exercise that causes your heart to beat faster (aerobic exercise) most days of the week. Activities may include walking, swimming, or biking.  Work with your health care provider or diet and nutrition specialist (dietitian) to adjust your eating plan to your individual calorie needs. Reading food labels   Check food labels for the amount of sodium per serving. Choose foods with less than 5 percent of the Daily Value of sodium. Generally, foods with less than 300 mg of sodium per serving fit into this eating plan.  To find whole grains, look for the word "whole" as the first word in the ingredient list. Shopping  Buy products labeled as "low-sodium" or "no salt added."  Buy fresh foods. Avoid canned foods and premade or frozen meals. Cooking  Avoid adding salt when cooking. Use salt-free seasonings or herbs instead of table salt or sea salt. Check with your health care provider or pharmacist before using salt substitutes.   Do not fry foods. Cook foods using healthy methods such as baking, boiling, grilling, and broiling instead.  Cook with heart-healthy oils, such as olive, canola, soybean, or sunflower oil. Meal planning  Eat a balanced diet that includes: ? 5 or more servings of fruits and vegetables each day. At each meal, try to fill half of your plate with fruits and vegetables. ? Up to 6-8 servings of whole grains each day. ? Less than 6 oz of lean meat, poultry, or fish each day. A 3-oz serving of meat is about the same size as a deck of cards. One egg equals 1 oz. ? 2 servings of low-fat dairy each day. ? A serving of nuts, seeds, or beans 5 times each week. ? Heart-healthy fats. Healthy fats called Omega-3 fatty acids are found in foods such as flaxseeds and coldwater fish, like sardines, salmon, and mackerel.  Limit how much you eat of the following: ? Canned or prepackaged foods. ? Food that is high in trans fat, such as fried foods. ? Food that is high in saturated fat, such as fatty meat. ? Sweets, desserts, sugary drinks, and other foods with added sugar. ? Full-fat dairy products.  Do not salt foods before eating.  Try to eat at least 2 vegetarian meals each week.  Eat more home-cooked food and less restaurant, buffet, and fast food.  When eating at a restaurant, ask that your food be prepared with less salt  or no salt, if possible. What foods are recommended? The items listed may not be a complete list. Talk with your dietitian about what dietary choices are best for you. Grains Whole-grain or whole-wheat bread. Whole-grain or whole-wheat pasta. Brown rice. [name]. Bulgur. Whole-grain and low-sodium cereals. Pita bread. Low-fat, low-sodium crackers. Whole-wheat flour tortillas. Vegetables Fresh or frozen vegetables (raw, steamed, roasted, or grilled). Low-sodium or reduced-sodium tomato and vegetable juice. Low-sodium or reduced-sodium tomato sauce and tomato paste.  Low-sodium or reduced-sodium canned vegetables. Fruits All fresh, dried, or frozen fruit. Canned fruit in natural juice (without added sugar). Meat and other protein foods Skinless chicken or [place]. Ground chicken or [place]. Pork with fat trimmed off. Fish and seafood. Egg whites. Dried beans, peas, or lentils. Unsalted nuts, nut butters, and seeds. Unsalted canned beans. Lean cuts of beef with fat trimmed off. Low-sodium, lean deli meat. Dairy Low-fat (1%) or fat-free (skim) milk. Fat-free, low-fat, or reduced-fat cheeses. Nonfat, low-sodium ricotta or cottage cheese. Low-fat or nonfat yogurt. Low-fat, low-sodium cheese. Fats and oils Soft margarine without trans fats. Vegetable oil. Low-fat, reduced-fat, or light mayonnaise and salad dressings (reduced-sodium). Canola, safflower, olive, soybean, and sunflower oils. Avocado. Seasoning and other foods Herbs. Spices. Seasoning mixes without salt. Unsalted popcorn and pretzels. Fat-free sweets. What foods are not recommended? The items listed may not be a complete list. Talk with your dietitian about what dietary choices are best for you. Grains Baked goods made with fat, such as croissants, muffins, or some breads. Dry pasta or rice meal packs. Vegetables Creamed or fried vegetables. Vegetables in a cheese sauce. Regular canned vegetables (not low-sodium or reduced-sodium). Regular canned tomato sauce and paste (not low-sodium or reduced-sodium). Regular tomato and vegetable juice (not low-sodium or reduced-sodium). [name]. Olives. Fruits Canned fruit in a light or heavy syrup. Fried fruit. Fruit in cream or butter sauce. Meat and other protein foods Fatty cuts of meat. Ribs. Fried meat. [name]. Sausage. Bologna and other processed lunch meats. Salami. Fatback. Hotdogs. Bratwurst. Salted nuts and seeds. Canned beans with added salt. Canned or smoked fish. Whole eggs or egg yolks. Chicken or [place] with skin. Dairy Whole or 2% milk, cream, and  half-and-half. Whole or full-fat cream cheese. Whole-fat or sweetened yogurt. Full-fat cheese. Nondairy creamers. Whipped toppings. Processed cheese and cheese spreads. Fats and oils Butter. Stick margarine. Lard. Shortening. Ghee. Bacon fat. Tropical oils, such as coconut, palm kernel, or palm oil. Seasoning and other foods Salted popcorn and pretzels. Onion salt, garlic salt, seasoned salt, table salt, and sea salt. Worcestershire sauce. Tartar sauce. Barbecue sauce. Teriyaki sauce. Soy sauce, including reduced-sodium. Steak sauce. Canned and packaged gravies. Fish sauce. Oyster sauce. Cocktail sauce. Horseradish that you find on the shelf. Ketchup. Mustard. Meat flavorings and tenderizers. Bouillon cubes. Hot sauce and Tabasco sauce. Premade or packaged marinades. Premade or packaged taco seasonings. Relishes. Regular salad dressings. Where to find more information:  National Heart, Lung, and [place]: https://wilson-eaton.com/  American Heart Association: www.heart.org Summary  The DASH eating plan is a healthy eating plan that has been shown to reduce high blood pressure (hypertension). It may also reduce your risk for type 2 diabetes, heart disease, and stroke.  With the DASH eating plan, you should limit salt (sodium) intake to 2,300 mg a day. If you have hypertension, you may need to reduce your sodium intake to 1,500 mg a day.  When on the DASH eating plan, aim to eat more fresh fruits and vegetables, whole grains, lean proteins, low-fat dairy,  and heart-healthy fats.  Work with your health care provider or diet and nutrition specialist (dietitian) to adjust your eating plan to your individual calorie needs. This information is not intended to replace advice given to you by your health care provider. Make sure you discuss any questions you have with your health care provider. Document Released: 05/15/2011 Document Revised: 05/08/2017 Document Reviewed: 05/19/2016 Elsevier Patient  Education  2020 [employer].

## 2019-01-14 ENCOUNTER — Other Ambulatory Visit: Payer: Self-pay

## 2019-01-14 ENCOUNTER — Ambulatory Visit (INDEPENDENT_AMBULATORY_CARE_PROVIDER_SITE_OTHER): Payer: No Typology Code available for payment source | Admitting: Cardiology

## 2019-01-14 ENCOUNTER — Ambulatory Visit (INDEPENDENT_AMBULATORY_CARE_PROVIDER_SITE_OTHER): Payer: No Typology Code available for payment source | Admitting: Pharmacist

## 2019-01-14 ENCOUNTER — Encounter: Payer: Self-pay | Admitting: Cardiology

## 2019-01-14 VITALS — BP 118/80 | HR 83 | Ht 74.0 in | Wt 338.8 lb

## 2019-01-14 DIAGNOSIS — I359 Nonrheumatic aortic valve disorder, unspecified: Secondary | ICD-10-CM

## 2019-01-14 DIAGNOSIS — Z952 Presence of prosthetic heart valve: Secondary | ICD-10-CM

## 2019-01-14 DIAGNOSIS — R600 Localized edema: Secondary | ICD-10-CM

## 2019-01-14 DIAGNOSIS — Z01818 Encounter for other preprocedural examination: Secondary | ICD-10-CM

## 2019-01-14 DIAGNOSIS — G4733 Obstructive sleep apnea (adult) (pediatric): Secondary | ICD-10-CM

## 2019-01-14 DIAGNOSIS — Z7901 Long term (current) use of anticoagulants: Secondary | ICD-10-CM | POA: Diagnosis not present

## 2019-01-14 DIAGNOSIS — R55 Syncope and collapse: Secondary | ICD-10-CM | POA: Diagnosis not present

## 2019-01-14 DIAGNOSIS — I480 Paroxysmal atrial fibrillation: Secondary | ICD-10-CM

## 2019-01-14 DIAGNOSIS — I10 Essential (primary) hypertension: Secondary | ICD-10-CM

## 2019-01-14 DIAGNOSIS — Z5181 Encounter for therapeutic drug level monitoring: Secondary | ICD-10-CM

## 2019-01-14 LAB — POCT INR: INR: 3.2 — AB (ref 2.0–3.0)

## 2019-01-14 MED ORDER — WARFARIN SODIUM 5 MG PO TABS: ORAL_TABLET | ORAL | 1 refills | Status: DC

## 2019-01-14 NOTE — Patient Instructions (Addendum)
Medication Instructions:  Your physician recommends that you continue on your current medications as directed. Please refer to the Current Medication list given to you today.  If you need a refill on your cardiac medications before your next appointment, please call your pharmacy.   Lab work: None   If you have labs (blood work) drawn today and your tests are completely normal, you will receive your results only by: Marland Kitchen. MyChart Message (if you have MyChart) OR . A paper copy in the mail If you have any lab test that is abnormal or we need to change your treatment, we will call you to review the results.  Testing/Procedures: None   Follow-Up: . Follow up with Lizabeth LeydenNina Ronaldo Crilly, NP in 3 months ( We will call you when her scheduled comes out)  Any Other Special Instructions Will Be Listed Below (If Applicable).   Dietary changes that we discussed Try to use a lot of vegetables like zucchini, squash, brussels sprouts, cabbage, spinach, greens (not salted), onions, mushrooms, Nuts in small quantities. Try to keep your greens constant daily due to being on coumadin.  Get grass fed ground beef [facility]  Stop drinking the bottle green tea, drink mostly water.   Switch to plain oatmeal. Add blueberries, or other berries and walnuts. Avoid bananas, or just 1/2 once in a while.   No flavored yogurt. Can use plain yogurt and add a drizzle of honey and some berries and/or nuts.   [employer] and [employer] have really good fruits and vegetables. Try to get fresh and some frozen.   Stop using regular sugar. Stevia is an option. Can get drops to add to your coffee.   Cut back on fast food for at least a month and will see good benefit. Then can get some as a treat once in a while.   Try to keep salt intake to 2000 mg per day. Look at packages and shoot for foods with less than 500 mg per serving, the lower the better. All packaged foods have a lot of salt.    Do the following things EVERY DAY:   1.  Weigh yourself EVERY morning after you go to the bathroom but before you eat or drink anything. Write this number down in a weight log/diary. If you gain 3 pounds overnight or 5 pounds in a week, call the office.   2. Take your medicines as prescribed. If you have concerns about your medications, please call us before you stop taking them.    3. Eat low salt foods-Limit salt (sodium) to 2000 mg per day. This will help prevent your body from holding onto fluid. Read food labels as many processed foods have a lot of sodium, especially canned goods and prepackaged meats. If you would like some assistance choosing low sodium foods, we would be happy to set you up with a nutritionist.   4. Stay as active as you can everyday. Staying active will give you more energy and make your muscles stronger. Start with 5 minutes at a time and work your way up to 30 minutes a day. Break up your activities--do some in the morning and some in the afternoon. Start with 3 days per week and work your way up to 5 days as you can.  If you have chest pain, feel short of breath, dizzy, or lightheaded, STOP. If you don't feel better after a short rest, call 911. If you do feel better, call the office to let us know you have  symptoms with exercise.   5. Limit all fluids for the day to less than 2 liters. Fluid includes all drinks, coffee, juice, ice chips, soup, jello, and all other liquids.    DASH Eating Plan DASH stands for "Dietary Approaches to Stop Hypertension." The DASH eating plan is a healthy eating plan that has been shown to reduce high blood pressure (hypertension). It may also reduce your risk for type 2 diabetes, heart disease, and stroke. The DASH eating plan may also help with weight loss. What are tips for following this plan?  General guidelines  Avoid eating more than 2,300 mg (milligrams) of salt (sodium) a day. If you have hypertension, you may need to reduce your sodium intake to 1,500 mg a day.  Limit  alcohol intake to no more than 1 drink a day for nonpregnant women and 2 drinks a day for men. One drink equals 12 oz of beer, 5 oz of wine, or 1 oz of hard liquor.  Work with your health care provider to maintain a healthy body weight or to lose weight. Ask what an ideal weight is for you.  Get at least 30 minutes of exercise that causes your heart to beat faster (aerobic exercise) most days of the week. Activities may include walking, swimming, or biking.  Work with your health care provider or diet and nutrition specialist (dietitian) to adjust your eating plan to your individual calorie needs. Reading food labels   Check food labels for the amount of sodium per serving. Choose foods with less than 5 percent of the Daily Value of sodium. Generally, foods with less than 300 mg of sodium per serving fit into this eating plan.  To find whole grains, look for the word "whole" as the first word in the ingredient list. Shopping  Buy products labeled as "low-sodium" or "no salt added."  Buy fresh foods. Avoid canned foods and premade or frozen meals. Cooking  Avoid adding salt when cooking. Use salt-free seasonings or herbs instead of table salt or sea salt. Check with your health care provider or pharmacist before using salt substitutes.  Do not fry foods. Cook foods using healthy methods such as baking, boiling, grilling, and broiling instead.  Cook with heart-healthy oils, such as olive, canola, soybean, or sunflower oil. Meal planning  Eat a balanced diet that includes: ? 5 or more servings of fruits and vegetables each day. At each meal, try to fill half of your plate with fruits and vegetables. ? Up to 6-8 servings of whole grains each day. ? Less than 6 oz of lean meat, poultry, or fish each day. A 3-oz serving of meat is about the same size as a deck of cards. One egg equals 1 oz. ? 2 servings of low-fat dairy each day. ? A serving of nuts, seeds, or beans 5 times each week. ?  Heart-healthy fats. Healthy fats called Omega-3 fatty acids are found in foods such as flaxseeds and coldwater fish, like sardines, salmon, and mackerel.  Limit how much you eat of the following: ? Canned or prepackaged foods. ? Food that is high in trans fat, such as fried foods. ? Food that is high in saturated fat, such as fatty meat. ? Sweets, desserts, sugary drinks, and other foods with added sugar. ? Full-fat dairy products.  Do not salt foods before eating.  Try to eat at least 2 vegetarian meals each week.  Eat more home-cooked food and less restaurant, buffet, and fast food.  When eating  at a restaurant, ask that your food be prepared with less salt or no salt, if possible. What foods are recommended? The items listed may not be a complete list. Talk with your dietitian about what dietary choices are best for you. Grains Whole-grain or whole-wheat bread. Whole-grain or whole-wheat pasta. Brown rice. Orpah Cobbatmeal. Quinoa. Bulgur. Whole-grain and low-sodium cereals. Pita bread. Low-fat, low-sodium crackers. Whole-wheat flour tortillas. Vegetables Fresh or frozen vegetables (raw, steamed, roasted, or grilled). Low-sodium or reduced-sodium tomato and vegetable juice. Low-sodium or reduced-sodium tomato sauce and tomato paste. Low-sodium or reduced-sodium canned vegetables. Fruits All fresh, dried, or frozen fruit. Canned fruit in natural juice (without added sugar). Meat and other protein foods Skinless chicken or [place]. Ground chicken or [place]. Pork with fat trimmed off. Fish and seafood. Egg whites. Dried beans, peas, or lentils. Unsalted nuts, nut butters, and seeds. Unsalted canned beans. Lean cuts of beef with fat trimmed off. Low-sodium, lean deli meat. Dairy Low-fat (1%) or fat-free (skim) milk. Fat-free, low-fat, or reduced-fat cheeses. Nonfat, low-sodium ricotta or cottage cheese. Low-fat or nonfat yogurt. Low-fat, low-sodium cheese. Fats and oils Soft margarine without trans  fats. Vegetable oil. Low-fat, reduced-fat, or light mayonnaise and salad dressings (reduced-sodium). Canola, safflower, olive, soybean, and sunflower oils. Avocado. Seasoning and other foods Herbs. Spices. Seasoning mixes without salt. Unsalted popcorn and pretzels. Fat-free sweets. What foods are not recommended? The items listed may not be a complete list. Talk with your dietitian about what dietary choices are best for you. Grains Baked goods made with fat, such as croissants, muffins, or some breads. Dry pasta or rice meal packs. Vegetables Creamed or fried vegetables. Vegetables in a cheese sauce. Regular canned vegetables (not low-sodium or reduced-sodium). Regular canned tomato sauce and paste (not low-sodium or reduced-sodium). Regular tomato and vegetable juice (not low-sodium or reduced-sodium). Rosita FirePickles. Olives. Fruits Canned fruit in a light or heavy syrup. Fried fruit. Fruit in cream or butter sauce. Meat and other protein foods Fatty cuts of meat. Ribs. Fried meat. Tomasa BlaseBacon. Sausage. Bologna and other processed lunch meats. Salami. Fatback. Hotdogs. Bratwurst. Salted nuts and seeds. Canned beans with added salt. Canned or smoked fish. Whole eggs or egg yolks. Chicken or [place] with skin. Dairy Whole or 2% milk, cream, and half-and-half. Whole or full-fat cream cheese. Whole-fat or sweetened yogurt. Full-fat cheese. Nondairy creamers. Whipped toppings. Processed cheese and cheese spreads. Fats and oils Butter. Stick margarine. Lard. Shortening. Ghee. Bacon fat. Tropical oils, such as coconut, palm kernel, or palm oil. Seasoning and other foods Salted popcorn and pretzels. Onion salt, garlic salt, seasoned salt, table salt, and sea salt. Worcestershire sauce. Tartar sauce. Barbecue sauce. Teriyaki sauce. Soy sauce, including reduced-sodium. Steak sauce. Canned and packaged gravies. Fish sauce. Oyster sauce. Cocktail sauce. Horseradish that you find on the shelf. Ketchup. Mustard. Meat  flavorings and tenderizers. Bouillon cubes. Hot sauce and Tabasco sauce. Premade or packaged marinades. Premade or packaged taco seasonings. Relishes. Regular salad dressings. Where to find more information:  National Heart, Lung, and Blood Institute: PopSteam.iswww.nhlbi.nih.gov  American Heart Association: www.heart.org Summary  The DASH eating plan is a healthy eating plan that has been shown to reduce high blood pressure (hypertension). It may also reduce your risk for type 2 diabetes, heart disease, and stroke.  With the DASH eating plan, you should limit salt (sodium) intake to 2,300 mg a day. If you have hypertension, you may need to reduce your sodium intake to 1,500 mg a day.  When on the DASH eating plan, aim to  eat more fresh fruits and vegetables, whole grains, lean proteins, low-fat dairy, and heart-healthy fats.  Work with your health care provider or diet and nutrition specialist (dietitian) to adjust your eating plan to your individual calorie needs. This information is not intended to replace advice given to you by your health care provider. Make sure you discuss any questions you have with your health care provider. Document Released: 05/15/2011 Document Revised: 05/08/2017 Document Reviewed: 05/19/2016 Elsevier Patient Education  2020 [employer].

## 2019-01-14 NOTE — Patient Instructions (Signed)
Description   Continue taking 1.5 tablets daily except 2 tablets on Fridays.  Recheck INR in 4 weeks - pending ESI, needs Lovenox bridge. Call us when your procedure date is set [phone] & Fax number is [phone]

## 2019-01-14 NOTE — Progress Notes (Signed)
Cardiology Office Note:    Date:  01/14/2019   ID:  [name], DOB Nov 07, 1960, MRN [id number]  PCP:  [name], Lance Patricia, MD  Cardiologist:  Lance Bollman, MD  Referring MD: [name],*   Chief Complaint  Patient presents with  . Leg Swelling    History of Present Illness:    [name] is a 58 y.o. male with a past medical history significant for aortic valve disease and thoracic aortic aneurysm s/p TAA repair with mechanical aortic valve replacement and reimplantation of coronary arteries in 2006, paroxysmal atrial fibrillation, diastolic heart failure, hypertension, hyperlipidemia, severe OSA on CPAP, morbid obesity, venous insufficiency with associated stasis.  He is on chronic anticoagulation with warfarin.  Earlier this year he had problems with the bleeding leg wound at the point of a varicosity or ruptured capillary and he was referred to the [facility].  His last nuclear stress test was low risk in 08/2018.  The patient was seen in the office 11/16/2018 as an acute visit for lower extremity edema.  He had been eating Vienna sausages frequently.  He was also noted to have difficulty with paying for his medications.  Our social worker provided [employer] gift cards to help.  His weight was up 20 pounds but he had no shortness of breath.  Dietary modifications were recommended and Lasix was increased to 80 mg twice daily for 2 days then 40 mg daily.  He was seen in follow-up on 12/06/2018 by Lance Chard, NP at which time he reported improved diet but was still eating canned vegetables.  He was no longer eating Vienna sausages.  His weight was 335 pounds with a baseline weight in March of 327 pounds.  He reported decreased lower extremity edema but still moderate.  His Lasix was increased to 40-minute twice daily.  His recent labs had shown a normal kidney function with creatinine 0.8.  [name] has needed clearance to have a back injection, but this has been  postponed due to his volume overload issues.  He is here today for reevaluation and possible clearance.  He will need to hold warfarin for 5 days prior to the procedure and will need Lovenox bridging, twice a day dosing due to his weight.  To be coordinated through our Coumadin clinic.  Wt is down a little from 341 to 338.  He reports that he continues to limit sodium intake, no longer eating Vienna sausages, however upon further questioning, he eats out at [employer] or [employer] for fried chicken at least 2-3 times per week as well as other fast food places.  He also drinks 4-5 bottled green tea drinks per day, for sugar.  He says he does not really like many vegetables.  He is disability for back, knee and hip issues.    Past Medical History:  Diagnosis Date  . Ascending aortic aneurysm and dissection    2006 repaired  7 cm aneurysm  . Chest pain, atypical   . Coronary artery reimplantation   . Hyperlipidemia    Mixed  . Hypertension    Unspecified  . Nuclear stress test    Myoview 08/2018:  EF 58, no scar or ischemia; Low Risk  . Obesity   . Psoriasis   . S/P aortic valve and root replacement    HX of St. Jude  . Seizures (HCC)   . Sleep apnea, obstructive     Past Surgical History:  Procedure Laterality Date  . Ascending aortic dissection aneurysm  12/06/2004   ok for MRI 1.5 or 3T Using a 27-mm St. Jude mechanical valve conduit with reimplantation of both coronary arteries  . CORNEAL TRANSPLANT     R   . KNEE SURGERY     R  . Replacement of aortic valve  12/06/2004    Current Medications: Current Meds  Medication Sig  . acetaminophen (TYLENOL) 325 MG tablet Take 2 tablets (650 mg total) by mouth every 6 (six) hours as needed.  Marland Kitchen. aspirin 81 MG tablet Take 81 mg by mouth daily.  Marland Kitchen. atorvastatin (LIPITOR) 20 MG tablet Take 1 tablet (20 mg total) by mouth daily.  . Cholecalciferol (VITAMIN D) 50 MCG (2000 UT) tablet Take 1 tablet (2,000 Units total) by mouth 2 (two) times daily.   . ferrous sulfate 325 (65 FE) MG tablet Take 1 tablet (325 mg total) by mouth daily.  . furosemide (LASIX) 40 MG tablet Take 1 tablet (40 mg total) by mouth 2 (two) times daily.  . metoprolol succinate (TOPROL-XL) 50 MG 24 hr tablet Take 1 tablet (50 mg total) by mouth daily.  . Multiple Vitamins-Minerals (CENTRUM SILVER ADULT 50+) TABS 1 qd  . oxyCODONE-acetaminophen (PERCOCET/ROXICET) 5-325 MG tablet [name] - pain management  . potassium chloride (K-DUR) 10 MEQ tablet Take 1 tablet (10 mEq total) by mouth daily.  . pregabalin (LYRICA) 75 MG capsule Take 1 capsule (75 mg total) by mouth 2 (two) times daily.  . vitamin C (ASCORBIC ACID) 500 MG tablet Take 500 mg by mouth daily.  . [DISCONTINUED] warfarin (COUMADIN) 5 MG tablet TAKE AS DIRECTED BY COUMADIN CLINIC     Allergies:   Nsaids   Social History   Socioeconomic History  . Marital status: Married    Spouse name: Not on file  . Number of children: 0  . Years of education: 3212  . Highest education level: Not on file  Occupational History  . Occupation: MIXER/PACKER    Employer: [employer]    Comment: works at [employer]- inhales vapors  Social Needs  . Financial resource strain: Not on file  . Food insecurity    Worry: Not on file    Inability: Not on file  . Transportation needs    Medical: Not on file    Non-medical: Not on file  Tobacco Use  . Smoking status: Former Smoker    Packs/day: 0.10    Years: 18.00    Pack years: 1.80    Types: Cigarettes    Quit date: 12/07/1989    Years since quitting: 29.1  . Smokeless tobacco: Former Engineer, waterUser  Substance and Sexual Activity  . Alcohol use: No    Comment: Quit 1991  . Drug use: No  . Sexual activity: Not on file  Lifestyle  . Physical activity    Days per week: Not on file    Minutes per session: Not on file  . Stress: Not on file  Relationships  . Social Musicianconnections    Talks on phone: Not on file    Gets together: Not on file    Attends religious  service: Not on file    Active member of club or organization: Not on file    Attends meetings of clubs or organizations: Not on file    Relationship status: Not on file  Other Topics Concern  . Not on file  Social History Narrative   Married   No regular exercise      Patient drinks 2 cups of  coffee a day    Patient is right handed.      Family History: The patient's family history includes Cervical cancer in his mother and sister; Coronary artery disease in his mother; Diabetes in his father and mother. ROS:   Please see the history of present illness.     All other systems reviewed and are negative.  EKGs/Labs/Other Studies Reviewed:    The following studies were reviewed today:  Echo 01/20/18 Severe LVH, EF 60-65, no RWMA, Gr 1 DD, mechanical AVR functioning normally (mean 21), mod LAE, severe RAE, trivial TR, PASP 32  Echo 03/18/15 Severe LVH, EF 50-55, no RWMA, normally functioning AVR, mod LAE, mild RAE  Nuc study 08/31/18 Study Highlights    Nuclear stress EF: 58%. The left ventricular ejection fraction is normal (55-65%). The LV appears to be mildly dilated.  There was no ST segment deviation noted during stress.  This is a low risk study. Threre is no evidence of ischemia and no evidence of infarction. There is mild LV dilitation with normal LV function      EKG:  EKG is not ordered today.    Recent Labs: 08/27/2018: Hemoglobin 9.3; NT-Pro BNP 39; Platelets 261 11/16/2018: BUN 11; Creatinine, Ser 0.80; Potassium 4.2; Sodium 145   Recent Lipid Panel No results found for: CHOL, TRIG, HDL, CHOLHDL, VLDL, LDLCALC, LDLDIRECT  Physical Exam:    VS:  BP 118/80   Pulse 83   Ht 6\' 2"  (1.88 m)   Wt (!) 338 lb 12.8 oz (153.7 kg)   SpO2 98%   BMI 43.50 kg/m     Wt Readings from Last 3 Encounters:  01/14/19 (!) 338 lb 12.8 oz (153.7 kg)  01/05/19 (!) 341 lb 8 oz (154.9 kg)  12/06/18 (!) 335 lb (152 kg)     Physical Exam  Constitutional: He is oriented  to person, place, and time. He appears well-developed. No distress.  Morbidly obese male  HENT:  Head: Normocephalic and atraumatic.  Neck: Normal range of motion. Neck supple. No JVD present.  Cardiovascular: Normal rate, regular rhythm, normal heart sounds and intact distal pulses.  Pulmonary/Chest: Effort normal and breath sounds normal. No respiratory distress. He has no wheezes. He has no rales.  Abdominal: Soft. Bowel sounds are normal.  Large abdomen  Musculoskeletal: Normal range of motion.        General: Edema present.     Comments: 2+ bilateral lower leg edema, skin changes related to venous stasis  Neurological: He is alert and oriented to person, place, and time.  Skin: Skin is warm and dry.  Psychiatric: He has a normal mood and affect. His behavior is normal. Judgment and thought content normal.  Vitals reviewed.    ASSESSMENT:    1. Bilateral leg edema   2. Preoperative clearance   3. Syncope and collapse   4. S/P aortic valve replacement   5. PAF (paroxysmal atrial fibrillation) ([place])   6. Essential (primary) hypertension   7. OSA (obstructive sleep apnea)    PLAN:    In order of problems listed above:  1.  Lower extremity edema -Patient has been followed closely over the last 2 months with significant lower extremity edema and 20 pound weight gain likely due to dietary indiscretions. -No orthopnea or PND. -Likely mostly related to venous insufficiency. -At last visit 2 weeks ago Lasix was increased to 40 mg twice daily.   -Labs from 11/16/2018 showed normal renal function with creatinine 0.80 and potassium in normal  range, 4.2. -Weight-down 3 pounds.  -I think that obesity and very poor diet are contributing a great deal to his leg swelling and overall health. -We discussed low sodium and heart healthy diet extensively.  Specific recommendations were provided based on his personal eating habits. -Advised to elevate feet as much as possible.  2.  Back pain  with need for lumbar epidural injection -I will clear the patient to have his back injections.  He is not sure that he will be able to afford it but he will check. -Per our pharmacy protocol patient will need to stop Coumadin 5 days prior to injection and will need Lovenox bridge to be coordinated in our office. -With financial concerns, may not be able to afford Lovenox crossover.  3.  AVR with mechanical valve -On chronic Coumadin therapy -We will need Coumadin/Lovenox crossover in preparation for back injection -Stable per last echocardiogram 01/2018  4.  PAF -Has been maintaining sinus rhythm.  Regular rhythm on exam today. -Patient is on chronic anticoagulation for his valve.  5.  Hypertension -Blood pressure well controlled -Continue current regimen  8.  OSA on CPAP -He reports compliance  9. Obesity -Body mass index is 43.5 kg/m.  Patient with central obesity and prediabetes. -Weight loss would greatly help his overall health. -I had a long discussion with the patient about his current diet and specific changes that he can make it to significantly improve his overall health. -Printed information provided and patient will share with his wife so that she can help with his diet. -We will follow-up with him in 2 to 3 months, mainly to follow-up on his diet and make recommendations as needed.   Medication Adjustments/Labs and Tests Ordered: Current medicines are reviewed at length with the patient today.  Concerns regarding medicines are outlined above. Labs and tests ordered and medication changes are outlined in the patient instructions below:  Patient Instructions  Medication Instructions:  Your physician recommends that you continue on your current medications as directed. Please refer to the Current Medication list given to you today.  If you need a refill on your cardiac medications before your next appointment, please call your pharmacy.   Lab work: None   If you have  labs (blood work) drawn today and your tests are completely normal, you will receive your results only by: Marland Kitchen MyChart Message (if you have MyChart) OR . A paper copy in the mail If you have any lab test that is abnormal or we need to change your treatment, we will call you to review the results.  Testing/Procedures: None   Follow-Up: . Follow up with Lizabeth Leyden, NP in 3 months ( We will call you when her scheduled comes out)  Any Other Special Instructions Will Be Listed Below (If Applicable).   Dietary changes that we discussed Try to use a lot of vegetables like zucchini, squash, brussels sprouts, cabbage, spinach, greens (not salted), onions, mushrooms, Nuts in small quantities. Try to keep your greens constant daily due to being on coumadin.  Get grass fed ground beef at [facility]  Stop drinking the bottle green tea, drink mostly water.   Switch to plain oatmeal. Add blueberries, or other berries and walnuts. Avoid bananas, or just 1/2 once in a while.   No flavored yogurt. Can use plain yogurt and add a drizzle of honey and some berries and/or nuts.   [employer] and [employer] have really good fruits and vegetables. Try to get fresh and some frozen.  Stop using regular sugar. Stevia is an option. Can get drops to add to your coffee.   Cut back on fast food for at least a month and will see good benefit. Then can get some as a treat once in a while.   Try to keep salt intake to 2000 mg per day. Look at packages and shoot for foods with less than 500 mg per serving, the lower the better. All packaged foods have a lot of salt.    Do the following things EVERY DAY:   1. Weigh yourself EVERY morning after you go to the bathroom but before you eat or drink anything. Write this number down in a weight log/diary. If you gain 3 pounds overnight or 5 pounds in a week, call the office.   2. Take your medicines as prescribed. If you have concerns about your medications, please call us before  you stop taking them.    3. Eat low salt foods-Limit salt (sodium) to 2000 mg per day. This will help prevent your body from holding onto fluid. Read food labels as many processed foods have a lot of sodium, especially canned goods and prepackaged meats. If you would like some assistance choosing low sodium foods, we would be happy to set you up with a nutritionist.   4. Stay as active as you can everyday. Staying active will give you more energy and make your muscles stronger. Start with 5 minutes at a time and work your way up to 30 minutes a day. Break up your activities--do some in the morning and some in the afternoon. Start with 3 days per week and work your way up to 5 days as you can.  If you have chest pain, feel short of breath, dizzy, or lightheaded, STOP. If you don't feel better after a short rest, call 911. If you do feel better, call the office to let us know you have symptoms with exercise.   5. Limit all fluids for the day to less than 2 liters. Fluid includes all drinks, coffee, juice, ice chips, soup, jello, and all other liquids.    DASH Eating Plan DASH stands for "Dietary Approaches to Stop Hypertension." The DASH eating plan is a healthy eating plan that has been shown to reduce high blood pressure (hypertension). It may also reduce your risk for type 2 diabetes, heart disease, and stroke. The DASH eating plan may also help with weight loss. What are tips for following this plan?  General guidelines  Avoid eating more than 2,300 mg (milligrams) of salt (sodium) a day. If you have hypertension, you may need to reduce your sodium intake to 1,500 mg a day.  Limit alcohol intake to no more than 1 drink a day for nonpregnant women and 2 drinks a day for men. One drink equals 12 oz of beer, 5 oz of wine, or 1 oz of hard liquor.  Work with your health care provider to maintain a healthy body weight or to lose weight. Ask what an ideal weight is for you.  Get at least 30 minutes  of exercise that causes your heart to beat faster (aerobic exercise) most days of the week. Activities may include walking, swimming, or biking.  Work with your health care provider or diet and nutrition specialist (dietitian) to adjust your eating plan to your individual calorie needs. Reading food labels   Check food labels for the amount of sodium per serving. Choose foods with less than 5 percent of the Daily  Value of sodium. Generally, foods with less than 300 mg of sodium per serving fit into this eating plan.  To find whole grains, look for the word "whole" as the first word in the ingredient list. Shopping  Buy products labeled as "low-sodium" or "no salt added."  Buy fresh foods. Avoid canned foods and premade or frozen meals. Cooking  Avoid adding salt when cooking. Use salt-free seasonings or herbs instead of table salt or sea salt. Check with your health care provider or pharmacist before using salt substitutes.  Do not fry foods. Cook foods using healthy methods such as baking, boiling, grilling, and broiling instead.  Cook with heart-healthy oils, such as olive, canola, soybean, or sunflower oil. Meal planning  Eat a balanced diet that includes: ? 5 or more servings of fruits and vegetables each day. At each meal, try to fill half of your plate with fruits and vegetables. ? Up to 6-8 servings of whole grains each day. ? Less than 6 oz of lean meat, poultry, or fish each day. A 3-oz serving of meat is about the same size as a deck of cards. One egg equals 1 oz. ? 2 servings of low-fat dairy each day. ? A serving of nuts, seeds, or beans 5 times each week. ? Heart-healthy fats. Healthy fats called Omega-3 fatty acids are found in foods such as flaxseeds and coldwater fish, like sardines, salmon, and mackerel.  Limit how much you eat of the following: ? Canned or prepackaged foods. ? Food that is high in trans fat, such as fried foods. ? Food that is high in saturated  fat, such as fatty meat. ? Sweets, desserts, sugary drinks, and other foods with added sugar. ? Full-fat dairy products.  Do not salt foods before eating.  Try to eat at least 2 vegetarian meals each week.  Eat more home-cooked food and less restaurant, buffet, and fast food.  When eating at a restaurant, ask that your food be prepared with less salt or no salt, if possible. What foods are recommended? The items listed may not be a complete list. Talk with your dietitian about what dietary choices are best for you. Grains Whole-grain or whole-wheat bread. Whole-grain or whole-wheat pasta. Brown rice. Orpah Cobbatmeal. Quinoa. Bulgur. Whole-grain and low-sodium cereals. Pita bread. Low-fat, low-sodium crackers. Whole-wheat flour tortillas. Vegetables Fresh or frozen vegetables (raw, steamed, roasted, or grilled). Low-sodium or reduced-sodium tomato and vegetable juice. Low-sodium or reduced-sodium tomato sauce and tomato paste. Low-sodium or reduced-sodium canned vegetables. Fruits All fresh, dried, or frozen fruit. Canned fruit in natural juice (without added sugar). Meat and other protein foods Skinless chicken or [place]. Ground chicken or [place]. Pork with fat trimmed off. Fish and seafood. Egg whites. Dried beans, peas, or lentils. Unsalted nuts, nut butters, and seeds. Unsalted canned beans. Lean cuts of beef with fat trimmed off. Low-sodium, lean deli meat. Dairy Low-fat (1%) or fat-free (skim) milk. Fat-free, low-fat, or reduced-fat cheeses. Nonfat, low-sodium ricotta or cottage cheese. Low-fat or nonfat yogurt. Low-fat, low-sodium cheese. Fats and oils Soft margarine without trans fats. Vegetable oil. Low-fat, reduced-fat, or light mayonnaise and salad dressings (reduced-sodium). Canola, safflower, olive, soybean, and sunflower oils. Avocado. Seasoning and other foods Herbs. Spices. Seasoning mixes without salt. Unsalted popcorn and pretzels. Fat-free sweets. What foods are not recommended?  The items listed may not be a complete list. Talk with your dietitian about what dietary choices are best for you. Grains Baked goods made with fat, such as croissants, muffins, or some breads.  Dry pasta or rice meal packs. Vegetables Creamed or fried vegetables. Vegetables in a cheese sauce. Regular canned vegetables (not low-sodium or reduced-sodium). Regular canned tomato sauce and paste (not low-sodium or reduced-sodium). Regular tomato and vegetable juice (not low-sodium or reduced-sodium). Rosita Fire. Olives. Fruits Canned fruit in a light or heavy syrup. Fried fruit. Fruit in cream or butter sauce. Meat and other protein foods Fatty cuts of meat. Ribs. Fried meat. Tomasa Blase. Sausage. Bologna and other processed lunch meats. Salami. Fatback. Hotdogs. Bratwurst. Salted nuts and seeds. Canned beans with added salt. Canned or smoked fish. Whole eggs or egg yolks. Chicken or [place] with skin. Dairy Whole or 2% milk, cream, and half-and-half. Whole or full-fat cream cheese. Whole-fat or sweetened yogurt. Full-fat cheese. Nondairy creamers. Whipped toppings. Processed cheese and cheese spreads. Fats and oils Butter. Stick margarine. Lard. Shortening. Ghee. Bacon fat. Tropical oils, such as coconut, palm kernel, or palm oil. Seasoning and other foods Salted popcorn and pretzels. Onion salt, garlic salt, seasoned salt, table salt, and sea salt. Worcestershire sauce. Tartar sauce. Barbecue sauce. Teriyaki sauce. Soy sauce, including reduced-sodium. Steak sauce. Canned and packaged gravies. Fish sauce. Oyster sauce. Cocktail sauce. Horseradish that you find on the shelf. Ketchup. Mustard. Meat flavorings and tenderizers. Bouillon cubes. Hot sauce and Tabasco sauce. Premade or packaged marinades. Premade or packaged taco seasonings. Relishes. Regular salad dressings. Where to find more information:  National Heart, Lung, and Blood Institute: PopSteam.is  American Heart Association: www.heart.org  Summary  The DASH eating plan is a healthy eating plan that has been shown to reduce high blood pressure (hypertension). It may also reduce your risk for type 2 diabetes, heart disease, and stroke.  With the DASH eating plan, you should limit salt (sodium) intake to 2,300 mg a day. If you have hypertension, you may need to reduce your sodium intake to 1,500 mg a day.  When on the DASH eating plan, aim to eat more fresh fruits and vegetables, whole grains, lean proteins, low-fat dairy, and heart-healthy fats.  Work with your health care provider or diet and nutrition specialist (dietitian) to adjust your eating plan to your individual calorie needs. This information is not intended to replace advice given to you by your health care provider. Make sure you discuss any questions you have with your health care provider. Document Released: 05/15/2011 Document Revised: 05/08/2017 Document Reviewed: 05/19/2016 Elsevier Patient Education  2020 [employer].     Signed, Berton Bon, NP  01/14/2019 12:39 PM    [facility]

## 2019-01-17 ENCOUNTER — Telehealth: Payer: Self-pay | Admitting: Internal Medicine

## 2019-01-17 DIAGNOSIS — G4733 Obstructive sleep apnea (adult) (pediatric): Secondary | ICD-10-CM

## 2019-01-17 NOTE — Telephone Encounter (Signed)
Called and spoke w/ pt regarding CY's recommendations. Pt verbalized understanding with no additional questions. Order for replacement CPAP mask has been placed to [facility]. Nothing further needed at this time.

## 2019-01-17 NOTE — Telephone Encounter (Signed)
Called and spoke w/ pt. Pt states his CPAP mask (S20 Full face mask) is broken and that he cannot use his CPAP because of it. He states the plastic pieces holding the headband together broke. He reports calling [facility] ([facility]) and they told him he would need a prescription for a new mask. I attempted to set up an in-office appointment since pt does not have an upcoming appt and he was last seen 03/23/2018, however, pt states he does not currently have insurance and therefore would like to hold off on creating an appt.   I let him know I would get a message routed to [facility] for follow-up. Pt verbalized understanding with no additional questions.   CY, please advise if you would be ok with placing an order to replace pt's CPAP mask. Thank you.

## 2019-01-17 NOTE — Telephone Encounter (Signed)
Yes- please order DME replace broken CPAP mask of choice, and may also replace supplies, hoses, filters

## 2019-01-20 ENCOUNTER — Telehealth: Payer: Self-pay | Admitting: Internal Medicine

## 2019-01-20 DIAGNOSIS — G4733 Obstructive sleep apnea (adult) (pediatric): Secondary | ICD-10-CM

## 2019-01-20 NOTE — Telephone Encounter (Signed)
Spoke with the pt  He can not afford the mask that [facility] had and wants order sent to another DME- [place] [facility]  I have sent order  Pt aware  Nothing further needed

## 2019-02-11 ENCOUNTER — Ambulatory Visit (INDEPENDENT_AMBULATORY_CARE_PROVIDER_SITE_OTHER): Payer: No Typology Code available for payment source | Admitting: *Deleted

## 2019-02-11 ENCOUNTER — Other Ambulatory Visit: Payer: Self-pay

## 2019-02-11 DIAGNOSIS — I359 Nonrheumatic aortic valve disorder, unspecified: Secondary | ICD-10-CM

## 2019-02-11 DIAGNOSIS — Z952 Presence of prosthetic heart valve: Secondary | ICD-10-CM | POA: Diagnosis not present

## 2019-02-11 DIAGNOSIS — Z5181 Encounter for therapeutic drug level monitoring: Secondary | ICD-10-CM | POA: Diagnosis not present

## 2019-02-11 DIAGNOSIS — Z7901 Long term (current) use of anticoagulants: Secondary | ICD-10-CM | POA: Diagnosis not present

## 2019-02-11 LAB — POCT INR: INR: 3.4 — AB (ref 2.0–3.0)

## 2019-02-11 NOTE — Patient Instructions (Addendum)
Description   Continue taking 1.5 tablets daily except 2 tablets on Fridays.  Recheck INR in 5 weeks-pending ESI, needs Lovenox bridge. Call us when your procedure date is set [phone] & Fax number is [phone]     

## 2019-02-15 ENCOUNTER — Other Ambulatory Visit: Payer: Self-pay | Admitting: Cardiology

## 2019-02-15 NOTE — Telephone Encounter (Signed)
Please advise if OK to refill. Thank you! 

## 2019-03-22 ENCOUNTER — Ambulatory Visit (INDEPENDENT_AMBULATORY_CARE_PROVIDER_SITE_OTHER): Payer: No Typology Code available for payment source | Admitting: *Deleted

## 2019-03-22 ENCOUNTER — Other Ambulatory Visit: Payer: Self-pay

## 2019-03-22 DIAGNOSIS — I359 Nonrheumatic aortic valve disorder, unspecified: Secondary | ICD-10-CM

## 2019-03-22 DIAGNOSIS — Z5181 Encounter for therapeutic drug level monitoring: Secondary | ICD-10-CM | POA: Diagnosis not present

## 2019-03-22 DIAGNOSIS — Z952 Presence of prosthetic heart valve: Secondary | ICD-10-CM

## 2019-03-22 DIAGNOSIS — Z7901 Long term (current) use of anticoagulants: Secondary | ICD-10-CM | POA: Diagnosis not present

## 2019-03-22 LAB — POCT INR: INR: 2.9 (ref 2.0–3.0)

## 2019-03-22 NOTE — Patient Instructions (Addendum)
Description   Continue taking 1.5 tablets daily except 2 tablets on Fridays.  Recheck INR in 7 weeks-pending ESI, needs Lovenox bridge. Call us when your procedure date is set [phone] & Fax number is [phone]

## 2019-04-04 ENCOUNTER — Telehealth: Payer: Self-pay | Admitting: Internal Medicine

## 2019-04-04 NOTE — Telephone Encounter (Signed)
rx refill pregabalin (LYRICA) 75 MG capsule  [facility], [place] [address] [phone] (Phone) [phone] (Fax)

## 2019-04-05 ENCOUNTER — Other Ambulatory Visit: Payer: Self-pay | Admitting: Internal Medicine

## 2019-04-05 DIAGNOSIS — G589 Mononeuropathy, unspecified: Secondary | ICD-10-CM

## 2019-04-05 MED ORDER — PREGABALIN 75 MG PO CAPS: 75.0000 mg | ORAL_CAPSULE | Freq: Two times a day (BID) | ORAL | 2 refills | Status: DC

## 2019-04-06 NOTE — Telephone Encounter (Signed)
Refill sent 04/05/2019

## 2019-04-07 ENCOUNTER — Ambulatory Visit: Payer: No Typology Code available for payment source | Admitting: Internal Medicine

## 2019-04-08 ENCOUNTER — Other Ambulatory Visit: Payer: Self-pay | Admitting: Cardiology

## 2019-04-08 NOTE — Telephone Encounter (Signed)
Pt Warfarin Rx was sent in August 2020 and for 3 month supply with additional refills and pt has picked up RX today. Per pharmacy this is not needed because they had the wrong profile on pt since he has 2 of them.

## 2019-04-08 NOTE — Telephone Encounter (Signed)
°*  STAT* If patient is at the pharmacy, call can be transferred to refill team.   1. Which medications need to be refilled? (please list name of each medication and dose if known) warfarin (COUMADIN) 5 MG tablet  2. Which pharmacy/location (including street and city if local pharmacy) is medication to be sent to? [place], [place] - [address]  3. Do they need a 30 day or 90 day supply? 30 day  Patient is out of meds.

## 2019-04-20 ENCOUNTER — Ambulatory Visit: Payer: No Typology Code available for payment source | Admitting: Internal Medicine

## 2019-05-19 ENCOUNTER — Other Ambulatory Visit: Payer: Self-pay

## 2019-05-20 ENCOUNTER — Encounter: Payer: Self-pay | Admitting: Internal Medicine

## 2019-05-20 ENCOUNTER — Ambulatory Visit (INDEPENDENT_AMBULATORY_CARE_PROVIDER_SITE_OTHER): Payer: Self-pay | Admitting: Internal Medicine

## 2019-05-20 VITALS — BP 130/78 | HR 105 | Temp 97.3°F | Ht 74.0 in | Wt 349.8 lb

## 2019-05-20 DIAGNOSIS — K219 Gastro-esophageal reflux disease without esophagitis: Secondary | ICD-10-CM

## 2019-05-20 DIAGNOSIS — I480 Paroxysmal atrial fibrillation: Secondary | ICD-10-CM

## 2019-05-20 DIAGNOSIS — G894 Chronic pain syndrome: Secondary | ICD-10-CM

## 2019-05-20 DIAGNOSIS — E1159 Type 2 diabetes mellitus with other circulatory complications: Secondary | ICD-10-CM

## 2019-05-20 DIAGNOSIS — Z23 Encounter for immunization: Secondary | ICD-10-CM

## 2019-05-20 DIAGNOSIS — I10 Essential (primary) hypertension: Secondary | ICD-10-CM

## 2019-05-20 DIAGNOSIS — I5032 Chronic diastolic (congestive) heart failure: Secondary | ICD-10-CM

## 2019-05-20 NOTE — Patient Instructions (Signed)
-Nice seeing you today!!  -Flu vaccine today.  -Schedule follow up in 4 months.  -Follow low salt diet.   DASH Eating Plan DASH stands for "Dietary Approaches to Stop Hypertension." The DASH eating plan is a healthy eating plan that has been shown to reduce high blood pressure (hypertension). It may also reduce your risk for type 2 diabetes, heart disease, and stroke. The DASH eating plan may also help with weight loss. What are tips for following this plan?  General guidelines  Avoid eating more than 2,300 mg (milligrams) of salt (sodium) a day. If you have hypertension, you may need to reduce your sodium intake to 1,500 mg a day.  Limit alcohol intake to no more than 1 drink a day for nonpregnant women and 2 drinks a day for men. One drink equals 12 oz of beer, 5 oz of wine, or 1 oz of hard liquor.  Work with your health care provider to maintain a healthy body weight or to lose weight. Ask what an ideal weight is for you.  Get at least 30 minutes of exercise that causes your heart to beat faster (aerobic exercise) most days of the week. Activities may include walking, swimming, or biking.  Work with your health care provider or diet and nutrition specialist (dietitian) to adjust your eating plan to your individual calorie needs. Reading food labels   Check food labels for the amount of sodium per serving. Choose foods with less than 5 percent of the Daily Value of sodium. Generally, foods with less than 300 mg of sodium per serving fit into this eating plan.  To find whole grains, look for the word "whole" as the first word in the ingredient list. Shopping  Buy products labeled as "low-sodium" or "no salt added."  Buy fresh foods. Avoid canned foods and premade or frozen meals. Cooking  Avoid adding salt when cooking. Use salt-free seasonings or herbs instead of table salt or sea salt. Check with your health care provider or pharmacist before using salt substitutes.  Do not  fry foods. Cook foods using healthy methods such as baking, boiling, grilling, and broiling instead.  Cook with heart-healthy oils, such as olive, canola, soybean, or sunflower oil. Meal planning  Eat a balanced diet that includes: ? 5 or more servings of fruits and vegetables each day. At each meal, try to fill half of your plate with fruits and vegetables. ? Up to 6-8 servings of whole grains each day. ? Less than 6 oz of lean meat, poultry, or fish each day. A 3-oz serving of meat is about the same size as a deck of cards. One egg equals 1 oz. ? 2 servings of low-fat dairy each day. ? A serving of nuts, seeds, or beans 5 times each week. ? Heart-healthy fats. Healthy fats called Omega-3 fatty acids are found in foods such as flaxseeds and coldwater fish, like sardines, salmon, and mackerel.  Limit how much you eat of the following: ? Canned or prepackaged foods. ? Food that is high in trans fat, such as fried foods. ? Food that is high in saturated fat, such as fatty meat. ? Sweets, desserts, sugary drinks, and other foods with added sugar. ? Full-fat dairy products.  Do not salt foods before eating.  Try to eat at least 2 vegetarian meals each week.  Eat more home-cooked food and less restaurant, buffet, and fast food.  When eating at a restaurant, ask that your food be prepared with less salt or  no salt, if possible. What foods are recommended? The items listed may not be a complete list. Talk with your dietitian about what dietary choices are best for you. Grains Whole-grain or whole-wheat bread. Whole-grain or whole-wheat pasta. Brown rice. [name]. Bulgur. Whole-grain and low-sodium cereals. Pita bread. Low-fat, low-sodium crackers. Whole-wheat flour tortillas. Vegetables Fresh or frozen vegetables (raw, steamed, roasted, or grilled). Low-sodium or reduced-sodium tomato and vegetable juice. Low-sodium or reduced-sodium tomato sauce and tomato paste. Low-sodium or  reduced-sodium canned vegetables. Fruits All fresh, dried, or frozen fruit. Canned fruit in natural juice (without added sugar). Meat and other protein foods Skinless chicken or [place]. Ground chicken or [place]. Pork with fat trimmed off. Fish and seafood. Egg whites. Dried beans, peas, or lentils. Unsalted nuts, nut butters, and seeds. Unsalted canned beans. Lean cuts of beef with fat trimmed off. Low-sodium, lean deli meat. Dairy Low-fat (1%) or fat-free (skim) milk. Fat-free, low-fat, or reduced-fat cheeses. Nonfat, low-sodium ricotta or cottage cheese. Low-fat or nonfat yogurt. Low-fat, low-sodium cheese. Fats and oils Soft margarine without trans fats. Vegetable oil. Low-fat, reduced-fat, or light mayonnaise and salad dressings (reduced-sodium). Canola, safflower, olive, soybean, and sunflower oils. Avocado. Seasoning and other foods Herbs. Spices. Seasoning mixes without salt. Unsalted popcorn and pretzels. Fat-free sweets. What foods are not recommended? The items listed may not be a complete list. Talk with your dietitian about what dietary choices are best for you. Grains Baked goods made with fat, such as croissants, muffins, or some breads. Dry pasta or rice meal packs. Vegetables Creamed or fried vegetables. Vegetables in a cheese sauce. Regular canned vegetables (not low-sodium or reduced-sodium). Regular canned tomato sauce and paste (not low-sodium or reduced-sodium). Regular tomato and vegetable juice (not low-sodium or reduced-sodium). [name]. Olives. Fruits Canned fruit in a light or heavy syrup. Fried fruit. Fruit in cream or butter sauce. Meat and other protein foods Fatty cuts of meat. Ribs. Fried meat. [name]. Sausage. Bologna and other processed lunch meats. Salami. Fatback. Hotdogs. Bratwurst. Salted nuts and seeds. Canned beans with added salt. Canned or smoked fish. Whole eggs or egg yolks. Chicken or [place] with skin. Dairy Whole or 2% milk, cream, and half-and-half.  Whole or full-fat cream cheese. Whole-fat or sweetened yogurt. Full-fat cheese. Nondairy creamers. Whipped toppings. Processed cheese and cheese spreads. Fats and oils Butter. Stick margarine. Lard. Shortening. Ghee. Bacon fat. Tropical oils, such as coconut, palm kernel, or palm oil. Seasoning and other foods Salted popcorn and pretzels. Onion salt, garlic salt, seasoned salt, table salt, and sea salt. Worcestershire sauce. Tartar sauce. Barbecue sauce. Teriyaki sauce. Soy sauce, including reduced-sodium. Steak sauce. Canned and packaged gravies. Fish sauce. Oyster sauce. Cocktail sauce. Horseradish that you find on the shelf. Ketchup. Mustard. Meat flavorings and tenderizers. Bouillon cubes. Hot sauce and Tabasco sauce. Premade or packaged marinades. Premade or packaged taco seasonings. Relishes. Regular salad dressings. Where to find more information:  National Heart, Lung, and [place]: https://wilson-eaton.com/  American Heart Association: www.heart.org Summary  The DASH eating plan is a healthy eating plan that has been shown to reduce high blood pressure (hypertension). It may also reduce your risk for type 2 diabetes, heart disease, and stroke.  With the DASH eating plan, you should limit salt (sodium) intake to 2,300 mg a day. If you have hypertension, you may need to reduce your sodium intake to 1,500 mg a day.  When on the DASH eating plan, aim to eat more fresh fruits and vegetables, whole grains, lean proteins, low-fat dairy, and  heart-healthy fats.  Work with your health care provider or diet and nutrition specialist (dietitian) to adjust your eating plan to your individual calorie needs. This information is not intended to replace advice given to you by your health care provider. Make sure you discuss any questions you have with your health care provider. Document Released: 05/15/2011 Document Revised: 05/08/2017 Document Reviewed: 05/19/2016 Elsevier Patient Education  2020  [employer].

## 2019-05-20 NOTE — Progress Notes (Signed)
Established Patient Office Visit     This visit occurred during the SARS-CoV-2 public health emergency.  Safety protocols were in place, including screening questions prior to the visit, additional usage of staff PPE, and extensive cleaning of exam room while observing appropriate contact time as indicated for disinfecting solutions.    CC/Reason for Visit: 736-month follow-up chronic medical conditions  HPI: [name] is a 58 y.o. male who is coming in today for the above mentioned reasons. Past Medical History is significant for:   1.  History of hypertension  2.  Mechanical aortic valve replacement on chronic anticoagulation with Coumadin  3.  Heart failure with preserved ejection fraction.  All of his cardiology issues are followed by Dr. Excell Seltzerooper  4.  Obstructive sleep apnea on nightly CPAP  5.  GERD well-controlled off medications  6.  Morbid obesity  7.  Peripheral neuropathy well-controlled on Lyrica.  Unfortunately he has run into a situation where he is not able to afford his insurance co-pays so he has fallen off the wagon in regards to his medical follow-ups.  He has not seen his cardiologist in over a year.  He had to pay $80 out-of-pocket to see me today and states he had to borrow the money from a friend.  He states he has signed up for new insurance that begins January 1 so he would like to defer further medical care until then.  He has gained 10 pounds since his last visit in August.  He continues to eat fast food almost every day at a [employer] or [employer], he is eating a lot of canned foods especially things like Vienna sausage and spam.  He has again been counseled on importance of low-fat low-salt diet and how both spam and Vienna sausages are very unhealthy for him given his medical conditions.  I have offered medical weight loss programs but he would like to wait until the beginning of the year given his insurance situation.  He has had increased lower  extremity edema but no shortness of breath or chest pain with ambulation.  He is requesting his flu vaccination today.   Past Medical/Surgical History: Past Medical History:  Diagnosis Date  . Ascending aortic aneurysm and dissection    2006 repaired  7 cm aneurysm  . Chest pain, atypical   . Coronary artery reimplantation   . Hyperlipidemia    Mixed  . Hypertension    Unspecified  . Nuclear stress test    Myoview 08/2018:  EF 58, no scar or ischemia; Low Risk  . Obesity   . Psoriasis   . S/P aortic valve and root replacement    HX of St. Jude  . Seizures (HCC)   . Sleep apnea, obstructive     Past Surgical History:  Procedure Laterality Date  . Ascending aortic dissection aneurysm  12/06/2004   ok for MRI 1.5 or 3T Using a 27-mm St. Jude mechanical valve conduit with reimplantation of both coronary arteries  . CORNEAL TRANSPLANT     R   . KNEE SURGERY     R  . Replacement of aortic valve  12/06/2004    Social History:  reports that he quit smoking about 29 years ago. His smoking use included cigarettes. He has a 1.80 pack-year smoking history. He has quit using smokeless tobacco. He reports that he does not drink alcohol or use drugs.  Allergies: Allergies  Allergen Reactions  . Nsaids Other (See Comments)  Told not to take meds-per patient    Family History:  Family History  Problem Relation Age of Onset  . Cervical cancer Mother   . Coronary artery disease Mother   . Diabetes Mother   . Diabetes Father   . Cervical cancer Sister      Current Outpatient Medications:  .  acetaminophen (TYLENOL) 325 MG tablet, Take 2 tablets (650 mg total) by mouth every 6 (six) hours as needed., Disp: 30 tablet, Rfl: 0 .  aspirin 81 MG tablet, Take 81 mg by mouth daily., Disp: , Rfl:  .  Cholecalciferol (VITAMIN D) 50 MCG (2000 UT) tablet, Take 1 tablet (2,000 Units total) by mouth 2 (two) times daily., Disp: 180 tablet, Rfl: 0 .  Ferrous Sulfate (IRON) 325 (65 Fe) MG  TABS, Take 1 tablet by mouth once daily, Disp: 90 tablet, Rfl: 0 .  metoprolol succinate (TOPROL-XL) 50 MG 24 hr tablet, Take 1 tablet (50 mg total) by mouth daily., Disp: 90 tablet, Rfl: 3 .  Multiple Vitamins-Minerals (CENTRUM SILVER ADULT 50+) TABS, 1 qd, Disp: , Rfl:  .  oxyCODONE-acetaminophen (PERCOCET) 10-325 MG tablet, Take 1 tablet by mouth 4 (four) times daily as needed., Disp: , Rfl:  .  potassium chloride (K-DUR) 10 MEQ tablet, Take 1 tablet (10 mEq total) by mouth daily., Disp: 90 tablet, Rfl: 3 .  pregabalin (LYRICA) 75 MG capsule, Take 1 capsule (75 mg total) by mouth 2 (two) times daily., Disp: 60 capsule, Rfl: 2 .  vitamin C (ASCORBIC ACID) 500 MG tablet, Take 500 mg by mouth daily., Disp: , Rfl:  .  warfarin (COUMADIN) 5 MG tablet, TAKE 1.5 TO 2 TABLETS DAILY AS DIRECTED BY COUMADIN CLINIC, Disp: 135 tablet, Rfl: 1 .  atorvastatin (LIPITOR) 20 MG tablet, Take 1 tablet (20 mg total) by mouth daily., Disp: 90 tablet, Rfl: 3 .  furosemide (LASIX) 40 MG tablet, Take 1 tablet (40 mg total) by mouth 2 (two) times daily., Disp: 60 tablet, Rfl: 3  Review of Systems:  Constitutional: Denies fever, chills, diaphoresis, appetite change and fatigue.  HEENT: Denies photophobia, eye pain, redness, hearing loss, ear pain, congestion, sore throat, rhinorrhea, sneezing, mouth sores, trouble swallowing, neck pain, neck stiffness and tinnitus.   Respiratory: Denies SOB, DOE, cough, chest tightness,  and wheezing.   Cardiovascular: Denies chest pain, palpitations. Gastrointestinal: Denies nausea, vomiting, abdominal pain, diarrhea, constipation, blood in stool and abdominal distention.  Genitourinary: Denies dysuria, urgency, frequency, hematuria, flank pain and difficulty urinating.  Endocrine: Denies: hot or cold intolerance, sweats, changes in hair or nails, polyuria, polydipsia. Musculoskeletal: Denies myalgias, back pain, joint swelling, arthralgias and gait problem.  Skin: Denies pallor, rash  and wound.  Neurological: Denies dizziness, seizures, syncope, weakness, light-headedness, numbness and headaches.  Hematological: Denies adenopathy. Easy bruising, personal or family bleeding history  Psychiatric/Behavioral: Denies suicidal ideation, mood changes, confusion, nervousness, sleep disturbance and agitation    Physical Exam: Vitals:   05/20/19 1018  BP: 130/78  Pulse: (!) 105  Temp: (!) 97.3 F (36.3 C)  TempSrc: Temporal  SpO2: 98%  Weight: (!) 349 lb 12.8 oz (158.7 kg)  Height: 6\' 2"  (1.88 m)    Body mass index is 44.91 kg/m.   Constitutional: NAD, calm, comfortable, obese Eyes: PERRL, lids and conjunctivae normal ENMT: Mucous membranes are moist.  Respiratory: clear to auscultation bilaterally, no wheezing, no crackles. Normal respiratory effort. No accessory muscle use.  Cardiovascular: Regular rate and rhythm, no murmurs / rubs / gallops.  Metallic  valve click.  1-2+ nonpitting edema bilaterally 2+ pedal pulses.  Abdomen: no tenderness, no masses palpated. No hepatosplenomegaly. Bowel sounds positive.  Musculoskeletal: no clubbing / cyanosis. No joint deformity upper and lower extremities. Good ROM, no contractures. Normal muscle tone.  Skin: no rashes, lesions, ulcers. No induration Neurologic: Grossly intact and nonfocal Psychiatric: Normal judgment and insight. Alert and oriented x 3. Normal mood.    Impression and Plan:  Chronic diastolic heart failure (HCC) -Despite some mild nonpitting lower extremity edema he appears compensated. -He is unable to afford further medical care until beginning of the year, he does have leftover medications until then. -Given we are only 2 weeks away, I believe we can afford to wait until then to have him resume routine medical care.  Paroxysmal atrial fibrillation (HCC) -Appears to be in sinus rhythm today, rate controlled, anticoagulated on warfarin  Chronic pain syndrome -He sees a chronic pain management  specialist  Gastro-esophageal reflux disease without esophagitis -Well-controlled, not on PPI therapy.  Hypertension associated with diabetes (HCC) -Blood pressures well controlled today  Morbid obesity (HCC) -Discussed healthy lifestyle, including increased physical activity and better food choices to promote weight loss. -We discussed referral to medical weight management once his insurance kicks in.    Patient Instructions  -Nice seeing you today!!  -Flu vaccine today.  -Schedule follow up in 4 months.  -Follow low salt diet.   DASH Eating Plan DASH stands for "Dietary Approaches to Stop Hypertension." The DASH eating plan is a healthy eating plan that has been shown to reduce high blood pressure (hypertension). It may also reduce your risk for type 2 diabetes, heart disease, and stroke. The DASH eating plan may also help with weight loss. What are tips for following this plan?  General guidelines  Avoid eating more than 2,300 mg (milligrams) of salt (sodium) a day. If you have hypertension, you may need to reduce your sodium intake to 1,500 mg a day.  Limit alcohol intake to no more than 1 drink a day for nonpregnant women and 2 drinks a day for men. One drink equals 12 oz of beer, 5 oz of wine, or 1 oz of hard liquor.  Work with your health care provider to maintain a healthy body weight or to lose weight. Ask what an ideal weight is for you.  Get at least 30 minutes of exercise that causes your heart to beat faster (aerobic exercise) most days of the week. Activities may include walking, swimming, or biking.  Work with your health care provider or diet and nutrition specialist (dietitian) to adjust your eating plan to your individual calorie needs. Reading food labels   Check food labels for the amount of sodium per serving. Choose foods with less than 5 percent of the Daily Value of sodium. Generally, foods with less than 300 mg of sodium per serving fit into this  eating plan.  To find whole grains, look for the word "whole" as the first word in the ingredient list. Shopping  Buy products labeled as "low-sodium" or "no salt added."  Buy fresh foods. Avoid canned foods and premade or frozen meals. Cooking  Avoid adding salt when cooking. Use salt-free seasonings or herbs instead of table salt or sea salt. Check with your health care provider or pharmacist before using salt substitutes.  Do not fry foods. Cook foods using healthy methods such as baking, boiling, grilling, and broiling instead.  Cook with heart-healthy oils, such as olive, canola, soybean, or sunflower oil.  Meal planning  Eat a balanced diet that includes: ? 5 or more servings of fruits and vegetables each day. At each meal, try to fill half of your plate with fruits and vegetables. ? Up to 6-8 servings of whole grains each day. ? Less than 6 oz of lean meat, poultry, or fish each day. A 3-oz serving of meat is about the same size as a deck of cards. One egg equals 1 oz. ? 2 servings of low-fat dairy each day. ? A serving of nuts, seeds, or beans 5 times each week. ? Heart-healthy fats. Healthy fats called Omega-3 fatty acids are found in foods such as flaxseeds and coldwater fish, like sardines, salmon, and mackerel.  Limit how much you eat of the following: ? Canned or prepackaged foods. ? Food that is high in trans fat, such as fried foods. ? Food that is high in saturated fat, such as fatty meat. ? Sweets, desserts, sugary drinks, and other foods with added sugar. ? Full-fat dairy products.  Do not salt foods before eating.  Try to eat at least 2 vegetarian meals each week.  Eat more home-cooked food and less restaurant, buffet, and fast food.  When eating at a restaurant, ask that your food be prepared with less salt or no salt, if possible. What foods are recommended? The items listed may not be a complete list. Talk with your dietitian about what dietary choices are  best for you. Grains Whole-grain or whole-wheat bread. Whole-grain or whole-wheat pasta. Brown rice. [name]. Bulgur. Whole-grain and low-sodium cereals. Pita bread. Low-fat, low-sodium crackers. Whole-wheat flour tortillas. Vegetables Fresh or frozen vegetables (raw, steamed, roasted, or grilled). Low-sodium or reduced-sodium tomato and vegetable juice. Low-sodium or reduced-sodium tomato sauce and tomato paste. Low-sodium or reduced-sodium canned vegetables. Fruits All fresh, dried, or frozen fruit. Canned fruit in natural juice (without added sugar). Meat and other protein foods Skinless chicken or [place]. Ground chicken or [place]. Pork with fat trimmed off. Fish and seafood. Egg whites. Dried beans, peas, or lentils. Unsalted nuts, nut butters, and seeds. Unsalted canned beans. Lean cuts of beef with fat trimmed off. Low-sodium, lean deli meat. Dairy Low-fat (1%) or fat-free (skim) milk. Fat-free, low-fat, or reduced-fat cheeses. Nonfat, low-sodium ricotta or cottage cheese. Low-fat or nonfat yogurt. Low-fat, low-sodium cheese. Fats and oils Soft margarine without trans fats. Vegetable oil. Low-fat, reduced-fat, or light mayonnaise and salad dressings (reduced-sodium). Canola, safflower, olive, soybean, and sunflower oils. Avocado. Seasoning and other foods Herbs. Spices. Seasoning mixes without salt. Unsalted popcorn and pretzels. Fat-free sweets. What foods are not recommended? The items listed may not be a complete list. Talk with your dietitian about what dietary choices are best for you. Grains Baked goods made with fat, such as croissants, muffins, or some breads. Dry pasta or rice meal packs. Vegetables Creamed or fried vegetables. Vegetables in a cheese sauce. Regular canned vegetables (not low-sodium or reduced-sodium). Regular canned tomato sauce and paste (not low-sodium or reduced-sodium). Regular tomato and vegetable juice (not low-sodium or reduced-sodium). [name].  Olives. Fruits Canned fruit in a light or heavy syrup. Fried fruit. Fruit in cream or butter sauce. Meat and other protein foods Fatty cuts of meat. Ribs. Fried meat. [name]. Sausage. Bologna and other processed lunch meats. Salami. Fatback. Hotdogs. Bratwurst. Salted nuts and seeds. Canned beans with added salt. Canned or smoked fish. Whole eggs or egg yolks. Chicken or [place] with skin. Dairy Whole or 2% milk, cream, and half-and-half. Whole or full-fat cream cheese. Whole-fat  or sweetened yogurt. Full-fat cheese. Nondairy creamers. Whipped toppings. Processed cheese and cheese spreads. Fats and oils Butter. Stick margarine. Lard. Shortening. Ghee. Bacon fat. Tropical oils, such as coconut, palm kernel, or palm oil. Seasoning and other foods Salted popcorn and pretzels. Onion salt, garlic salt, seasoned salt, table salt, and sea salt. Worcestershire sauce. Tartar sauce. Barbecue sauce. Teriyaki sauce. Soy sauce, including reduced-sodium. Steak sauce. Canned and packaged gravies. Fish sauce. Oyster sauce. Cocktail sauce. Horseradish that you find on the shelf. Ketchup. Mustard. Meat flavorings and tenderizers. Bouillon cubes. Hot sauce and Tabasco sauce. Premade or packaged marinades. Premade or packaged taco seasonings. Relishes. Regular salad dressings. Where to find more information:  National Heart, Lung, and Admire: https://wilson-eaton.com/  American Heart Association: www.heart.org Summary  The DASH eating plan is a healthy eating plan that has been shown to reduce high blood pressure (hypertension). It may also reduce your risk for type 2 diabetes, heart disease, and stroke.  With the DASH eating plan, you should limit salt (sodium) intake to 2,300 mg a day. If you have hypertension, you may need to reduce your sodium intake to 1,500 mg a day.  When on the DASH eating plan, aim to eat more fresh fruits and vegetables, whole grains, lean proteins, low-fat dairy, and heart-healthy  fats.  Work with your health care provider or diet and nutrition specialist (dietitian) to adjust your eating plan to your individual calorie needs. This information is not intended to replace advice given to you by your health care provider. Make sure you discuss any questions you have with your health care provider. Document Released: 05/15/2011 Document Revised: 05/08/2017 Document Reviewed: 05/19/2016 Elsevier Patient Education  2020 [place], MD [facility]

## 2019-06-15 ENCOUNTER — Ambulatory Visit: Payer: 59 | Attending: Internal Medicine

## 2019-06-15 DIAGNOSIS — Z20822 Contact with and (suspected) exposure to covid-19: Secondary | ICD-10-CM

## 2019-06-16 LAB — NOVEL CORONAVIRUS, NAA: SARS-CoV-2, NAA: DETECTED — AB

## 2019-06-17 ENCOUNTER — Telehealth: Payer: Self-pay | Admitting: Physician Assistant

## 2019-06-17 NOTE — Telephone Encounter (Signed)
Patient was contacted regarding positive covid 19 results. He is totally asymptomatic. 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 No shortness of breath. We went over CDC guidelines for quarantine and ER precautions. If he should develop symptoms he would be a candidate for outpatient monoclonal antibody infusion. He will stay on quarantine for 14 days from testing date 1/6.    Cline Crock PA-C  [facility]

## 2019-06-20 NOTE — Telephone Encounter (Signed)
Pt. Called to verify quarantine requirements.Verbalizes understanding.

## 2019-07-07 ENCOUNTER — Telehealth: Payer: Self-pay | Admitting: *Deleted

## 2019-07-07 NOTE — Telephone Encounter (Signed)
Patient overdue for follow up. I attempted to reach him to discuss and offer him a virtual appointment for next week but did not get an answer.

## 2019-07-11 ENCOUNTER — Encounter: Payer: Self-pay | Admitting: Cardiology

## 2019-07-11 ENCOUNTER — Other Ambulatory Visit: Payer: Self-pay

## 2019-07-11 ENCOUNTER — Telehealth (INDEPENDENT_AMBULATORY_CARE_PROVIDER_SITE_OTHER): Payer: 59 | Admitting: Family Medicine

## 2019-07-11 VITALS — BP 135/97 | HR 62 | Ht 74.0 in | Wt 336.0 lb

## 2019-07-11 DIAGNOSIS — I480 Paroxysmal atrial fibrillation: Secondary | ICD-10-CM

## 2019-07-11 DIAGNOSIS — Z952 Presence of prosthetic heart valve: Secondary | ICD-10-CM

## 2019-07-11 DIAGNOSIS — G4733 Obstructive sleep apnea (adult) (pediatric): Secondary | ICD-10-CM | POA: Diagnosis not present

## 2019-07-11 DIAGNOSIS — I10 Essential (primary) hypertension: Secondary | ICD-10-CM

## 2019-07-11 DIAGNOSIS — R600 Localized edema: Secondary | ICD-10-CM

## 2019-07-11 DIAGNOSIS — I359 Nonrheumatic aortic valve disorder, unspecified: Secondary | ICD-10-CM | POA: Diagnosis not present

## 2019-07-11 MED ORDER — FUROSEMIDE 40 MG PO TABS: 40.0000 mg | ORAL_TABLET | Freq: Two times a day (BID) | ORAL | 3 refills | Status: DC

## 2019-07-11 NOTE — Addendum Note (Signed)
Addended by: Burnetta Sabin on: 07/11/2019 11:23 AM   Modules accepted: Orders

## 2019-07-11 NOTE — Patient Instructions (Signed)
Medication Instructions:  Your physician recommends that you continue on your current medications as directed. Please refer to the Current Medication list given to you today.  I have sent in the correct prescription for the Lasix to your pharmacy, [facility].  *If you need a refill on your cardiac medications before your next appointment, please call your pharmacy*  Lab Work: None ordered  If you have labs (blood work) drawn today and your tests are completely normal, you will receive your results only by: Marland Kitchen MyChart Message (if you have MyChart) OR . A paper copy in the mail If you have any lab test that is abnormal or we need to change your treatment, we will call you to review the results.  Testing/Procedures: None ordered  Follow-Up: At [facility], you and your health needs are our priority.  As part of our continuing mission to provide you with exceptional heart care, we have created designated Provider Care Teams.  These Care Teams include your primary Cardiologist (physician) and Advanced Practice Providers (APPs -  Physician Assistants and Nurse Practitioners) who all work together to provide you with the care you need, when you need it.  Your next appointment:   09/28/2019 8:45  The format for your next appointment:   Virtual Visit   Provider:   Nada Boozer, NP  Other Instructions

## 2019-07-11 NOTE — Progress Notes (Addendum)
Virtual Visit via Telephone Note   This visit type was conducted due to national recommendations for restrictions regarding the COVID-19 Pandemic (e.g. social distancing) in an effort to limit this patient's exposure and mitigate transmission in our community.  Due to his co-morbid illnesses, this patient is at least at moderate risk for complications without adequate follow up.  This format is felt to be most appropriate for this patient at this time.  The patient did not have access to video technology/had technical difficulties with video requiring transitioning to audio format only (telephone).  All issues noted in this document were discussed and addressed.  No physical exam could be performed with this format.  Please refer to the patient's chart for his  consent to telehealth for [facility].   Date:  07/11/2019   ID:  [name], DOB 02/02/1961, MRN [id number]  Patient Location: Home Provider Location: Office  PCP:  Isaac Bliss, Rayford Halsted, MD  Cardiologist:  Sherren Mocha, MD  Electrophysiologist:  None   Evaluation Performed:  Follow-Up Visit  Chief Complaint:     History of Present Illness:    [name] is a 59 y.o. male last encounter with Daune Perch, NP January 14, 2019.  Past medical history includes; aortic valve disease and thoracic aortic aneurysm status post TAA repair with mechanical aortic valve replacement and reimplantation of coronary arteries in 4709, PAF, diastolic heart failure, hypertension, hyperlipidemia, severe obstructive sleep apnea on CPAP, morbid obesity, venous insufficiency associated with stasis changes and bleeding varicosities, arthritis in lower back, hips, and knees.  He receives intermittent steroid joint injections.  On chronic anticoagulation with warfarin.  He had a low risk nuclear stress study March 2020.  Last seen in the office July 2020 for lower extremity edema.  His weight was up 20 pounds but he denied any  dyspnea.  Patient had been eating [place] sausages at least 3 times a week and going to fast food restaurants eating chicken at [employer] and [facility] as well as frequently patronizing fast food establishments.  Dietary recommendations were recommended.  Lasix was increased to 80 mg twice daily for 2 days then 40 mg daily per last note.  Patient was here during August visit for clearance for steroid injections in his back.  He was to hold his warfarin for 5 days prior to procedure and needed Lovenox bridging twice a day dosing due to his weight.  This was to be coordinated through the Coumadin clinic.  He is disabled due to arthritis to back, knees, hips.    Cardiac medications reviewed.  Cardiac medications include aspirin 81 mg daily, atorvastatin 20 mg daily, furosemide 40 mg twice daily, Toprol-XL 50 mg daily, warfarin 5 mg 1 to 1-1/2 tablets daily as directed by Coumadin clinic.  Patient had a recent positive Covid test June 15, 2019 due to exposure.  He had no symptoms.  Patient states he has stopped eating processed foods such as [place] and [place].  He has decreased his visits to [facility] and [employer] and is trying to eat better.  He states today he has had no recent issues other than some swelling in his legs.  When reviewing medications with patient he states he is only taking Lasix 40 mg once a day instead of the scheduled 40 mg p.o. twice daily.  He states the bottle directions are for once a day only.  His weight today is 336.  He states his baseline weight is somewhere around 307.  He denies any dyspnea.  Past Medical History:  Diagnosis Date  . Ascending aortic aneurysm and dissection    2006 repaired  7 cm aneurysm  . Chest pain, atypical   . Coronary artery reimplantation   . Hyperlipidemia    Mixed  . Hypertension    Unspecified  . Nuclear stress test    Myoview 08/2018:  EF 58, no scar or ischemia; Low Risk  . Obesity   . Psoriasis   . S/P aortic valve and root  replacement    HX of St. Jude  . Seizures (HCC)   . Sleep apnea, obstructive    Past Surgical History:  Procedure Laterality Date  . Ascending aortic dissection aneurysm  12/06/2004   ok for MRI 1.5 or 3T Using a 27-mm St. Jude mechanical valve conduit with reimplantation of both coronary arteries  . CORNEAL TRANSPLANT     R   . KNEE SURGERY     R  . Replacement of aortic valve  12/06/2004     Current Meds  Medication Sig  . acetaminophen (TYLENOL) 325 MG tablet Take 2 tablets (650 mg total) by mouth every 6 (six) hours as needed.  Marland Kitchen aspirin 81 MG tablet Take 81 mg by mouth daily.  . Cholecalciferol (VITAMIN D) 50 MCG (2000 UT) tablet Take 1 tablet (2,000 Units total) by mouth 2 (two) times daily.  . Ferrous Sulfate (IRON) 325 (65 Fe) MG TABS Take 1 tablet by mouth once daily  . furosemide (LASIX) 40 MG tablet Take 1 tablet (40 mg total) by mouth 2 (two) times daily.  . metoprolol succinate (TOPROL-XL) 50 MG 24 hr tablet Take 1 tablet (50 mg total) by mouth daily.  . Multiple Vitamins-Minerals (CENTRUM SILVER ADULT 50+) TABS 1 qd  . oxyCODONE-acetaminophen (PERCOCET) 10-325 MG tablet Take 1 tablet by mouth 4 (four) times daily as needed for pain.   . potassium chloride (K-DUR) 10 MEQ tablet Take 1 tablet (10 mEq total) by mouth daily.  . pregabalin (LYRICA) 75 MG capsule Take 1 capsule (75 mg total) by mouth 2 (two) times daily.  . vitamin C (ASCORBIC ACID) 500 MG tablet Take 500 mg by mouth daily.  Marland Kitchen warfarin (COUMADIN) 5 MG tablet TAKE 1.5 TO 2 TABLETS DAILY AS DIRECTED BY COUMADIN CLINIC     Allergies:   Nsaids   Social History   Tobacco Use  . Smoking status: Former Smoker    Packs/day: 0.10    Years: 18.00    Pack years: 1.80    Types: Cigarettes    Quit date: 12/07/1989    Years since quitting: 29.6  . Smokeless tobacco: Former Engineer, water Use Topics  . Alcohol use: No    Comment: Quit 1991  . Drug use: No     Family Hx: The patient's family history includes  Cervical cancer in his mother and sister; Coronary artery disease in his mother; Diabetes in his father and mother.  ROS:   Please see the history of present illness.    All other systems reviewed and are negative.   Prior CV studies:   The following studies were reviewed today:  Recent Zio patch placed November 16, 2018  Preliminary Findings Patient had a min HR of 42 bpm, max HR of 187 bpm, and avg HR of 67 bpm. Predominant underlying rhythm was Sinus Rhythm. First Degree AV Block was present. 1 run of Ventricular Tachycardia occurred lasting 5 beats with a max rate of 156 bpm (avg 127  bpm). 8891 Supraventricular Tachycardia runs occurred, the run with the fastest interval lasting 10 beats with a max rate of 187 bpm, the longest lasting 35 mins 38 secs with an avg rate of 96 bpm. Some episodes of Supraventricular Tachycardia may be possible Atrial Tachycardia with variable block. Isolated SVEs were occasional (4.8%, 63439), SVE Couplets were occasional (1.0%, 6885), and SVE Triplets were occasional (1.6%, 7179). Isolated VEs were rare (<1.0%, 963), VE Couplets were rare (<1.0%, 11), and VE Triplets were rare (<1.0%, 2). Ventricular Bigeminy and Trigeminy were present.  Echo 01/20/18 Severe LVH, EF 60-65, no RWMA, Gr 1 DD, mechanical AVR functioning normally (mean 21), mod LAE, severe RAE, trivial TR, PASP 32  Echo 03/18/15 Severe LVH, EF 50-55, no RWMA, normally functioning AVR, mod LAE, mild RAE  Nuc study 08/31/18 Study Highlights    Nuclear stress EF: 58%. The left ventricular ejection fraction is normal (55-65%). The LV appears to be mildly dilated.  There was no ST segment deviation noted during stress.  This is a low risk study. Threre is no evidence of ischemia and no evidence of infarction. There is mild LV dilitation with normal LV function     Labs/Other Tests and Data Reviewed:    EKG:  An ECG dated November 17, 2018 was personally reviewed today and demonstrated:   Sinus rhythm with first-degree AV block rate of 63.  Possible inferior infarct, age undetermined, cannot rule out anterior infarct, age undetermined  Recent Labs: 08/27/2018: Hemoglobin 9.3; NT-Pro BNP 39; Platelets 261 11/16/2018: BUN 11; Creatinine, Ser 0.80; Potassium 4.2; Sodium 145   Recent Lipid Panel No results found for: CHOL, TRIG, HDL, CHOLHDL, LDLCALC, LDLDIRECT  Wt Readings from Last 3 Encounters:  07/11/19 (!) 336 lb (152.4 kg)  05/20/19 (!) 349 lb 12.8 oz (158.7 kg)  01/14/19 (!) 338 lb 12.8 oz (153.7 kg)     Objective:    Vital Signs:  BP (!) 135/97   Pulse 62   Ht 6\' 2"  (1.88 m)   Wt (!) 336 lb (152.4 kg)   BMI 43.14 kg/m    VITAL SIGNS:  reviewed   Patient has a normal speech pattern no evidence of cough, dyspnea, or wheezing noted.  ASSESSMENT & PLAN:    1. Bilateral leg edema Patient admits to mild to moderate lower extremity edema.  He states he is only taking his Lasix once a day which are other directions on the bottle.  Advised patient the instructions on our in our to take the 40 mg Lasix twice daily.  We will resend to pharmacy the correct orders for Lasix p.o. twice daily.  Patient states his baseline weight is usually around 307.  His weight today is 336.  2. PAF (paroxysmal atrial fibrillation) (HCC) Patient denies any recent palpitations, arrhythmias, fluttering sensation or racing heart.  Heart rate today is 62.  Continue Toprol-XL 50 mg daily.  Continue Coumadin 1 to 1-1/2 pills daily as directed by Coumadin clinic.  3. Essential hypertension Blood pressure today 135/97.  Patient states his usual blood pressures at home are usually 130s over 180s consistently.  4. OSA (obstructive sleep apnea) Patient is compliant with CPAP therapy.   5. S/P aortic valve replacement Previous history of aortic valve replacement most most recent echocardiogram August 2019 showed severe LVH, EF 60-65, no RWMA, Gr 1 DD, mechanical AVR functioning normally (mean 21),  mod LAE, severe RAE, trivial TR, PASP 32  COVID-19 Education: The signs and symptoms of COVID-19 were discussed with the  patient and how to seek care for testing (follow up with PCP or arrange E-visit). The importance of social distancing was discussed today.  Time:   Today, I have spent 15 minutes with the patient with telehealth technology discussing the above problems.     Medication Adjustments/Labs and Tests Ordered: Current medicines are reviewed at length with the patient today.  Concerns regarding medicines are outlined above.   Tests Ordered: No orders of the defined types were placed in this encounter.   Medication Changes: No orders of the defined types were placed in this encounter.   Follow Up:  Either In Person or Virtual in 3 month(s)  Signed, Netta Neat, NP  07/11/2019 10:47 AM     [facility]

## 2019-07-18 ENCOUNTER — Telehealth: Payer: Self-pay | Admitting: Emergency Medicine

## 2019-07-18 NOTE — Telephone Encounter (Signed)
Please schedule patient and appt to est care with Dr Eldred Manges

## 2019-07-26 ENCOUNTER — Other Ambulatory Visit: Payer: Self-pay | Admitting: Internal Medicine

## 2019-07-26 DIAGNOSIS — G589 Mononeuropathy, unspecified: Secondary | ICD-10-CM

## 2019-07-27 ENCOUNTER — Other Ambulatory Visit: Payer: Self-pay | Admitting: Internal Medicine

## 2019-07-27 ENCOUNTER — Encounter (INDEPENDENT_AMBULATORY_CARE_PROVIDER_SITE_OTHER): Payer: Self-pay

## 2019-07-27 ENCOUNTER — Ambulatory Visit (INDEPENDENT_AMBULATORY_CARE_PROVIDER_SITE_OTHER): Payer: 59 | Admitting: Pharmacist

## 2019-07-27 ENCOUNTER — Other Ambulatory Visit: Payer: Self-pay

## 2019-07-27 DIAGNOSIS — Z7901 Long term (current) use of anticoagulants: Secondary | ICD-10-CM

## 2019-07-27 DIAGNOSIS — Z952 Presence of prosthetic heart valve: Secondary | ICD-10-CM

## 2019-07-27 DIAGNOSIS — I359 Nonrheumatic aortic valve disorder, unspecified: Secondary | ICD-10-CM

## 2019-07-27 DIAGNOSIS — Z5181 Encounter for therapeutic drug level monitoring: Secondary | ICD-10-CM | POA: Diagnosis not present

## 2019-07-27 DIAGNOSIS — G589 Mononeuropathy, unspecified: Secondary | ICD-10-CM

## 2019-07-27 LAB — POCT INR: INR: 3.7 — AB (ref 2.0–3.0)

## 2019-07-27 NOTE — Patient Instructions (Signed)
Continue taking 1.5 tablets daily except 2 tablets on Fridays.  Recheck INR in 6 weeks-pending ESI, needs Lovenox bridge. Call us when your procedure date is set [phone] & Fax number is [phone]

## 2019-07-27 NOTE — Telephone Encounter (Signed)
Please deny.  Rx sent yesterday.

## 2019-08-04 ENCOUNTER — Ambulatory Visit (INDEPENDENT_AMBULATORY_CARE_PROVIDER_SITE_OTHER): Payer: 59 | Admitting: Family Medicine

## 2019-08-04 ENCOUNTER — Encounter: Payer: Self-pay | Admitting: Family Medicine

## 2019-08-04 ENCOUNTER — Other Ambulatory Visit: Payer: Self-pay

## 2019-08-04 VITALS — BP 126/76 | HR 60 | Temp 97.6°F | Resp 20 | Ht 74.0 in | Wt 351.0 lb

## 2019-08-04 DIAGNOSIS — I10 Essential (primary) hypertension: Secondary | ICD-10-CM

## 2019-08-04 DIAGNOSIS — I5032 Chronic diastolic (congestive) heart failure: Secondary | ICD-10-CM

## 2019-08-04 DIAGNOSIS — G894 Chronic pain syndrome: Secondary | ICD-10-CM

## 2019-08-04 DIAGNOSIS — E782 Mixed hyperlipidemia: Secondary | ICD-10-CM | POA: Diagnosis not present

## 2019-08-04 DIAGNOSIS — G4733 Obstructive sleep apnea (adult) (pediatric): Secondary | ICD-10-CM

## 2019-08-04 DIAGNOSIS — R7303 Prediabetes: Secondary | ICD-10-CM

## 2019-08-04 DIAGNOSIS — I480 Paroxysmal atrial fibrillation: Secondary | ICD-10-CM | POA: Diagnosis not present

## 2019-08-04 MED ORDER — FUROSEMIDE 40 MG PO TABS: 60.0000 mg | ORAL_TABLET | Freq: Two times a day (BID) | ORAL | 2 refills | Status: DC

## 2019-08-04 NOTE — Progress Notes (Signed)
2/25/20213:37 PM  [name] [birth year], 59 y.o., male [id number]  Chief Complaint  Patient presents with  . Establish Care    HPI:   Patient is a 59 y.o. male with past medical history significant for HTN, prediabetes, HLP, mechanical AV replacement on coumadin, dHF, AAA repair, OSA on cpap, GERD, chronic pain (DDD lumbar spine with radiculopathy and bilateral knee OA) and psoriasis who presents today to establish care  Previous PCP at [facility] - does not accept new insurance, last OV dec 2020 - no changes to regime, BP was controlled  Cards - Dr Burt Knack, last OV with NP earlier this month, increased lasix to 74m BID as was having new onset edema, fu 4 months Patient reports good UOP, lost about 5-6lbs but has regained, swelling not improved, per note baseline weight 307lbs, last NM stress test march 2020 - low risk, EF 58%  Coumadin managed by coumadin clinic, last INR was 3.7 on feb 17th  OSA on cpap - used to see Dr YAnnamaria Boots last OV oct 2019, had to stop when he lost insurance. Reports nightly use of cpap  Chronic pain - lyrica and percocet for DDD of lumbar spine and bilateral knee OA. Patient needs referral to new pain mgt, [facility] provider [facility]. Has not had surgeries or other interventions.  On permanent disability since nov 2020  Psoriasis - managed by derm on consentyx 3059mmonthly  [facility] 08/04/2019 01/05/2019  Decreased Interest 0 0  Down, Depressed, Hopeless 0 0  PHQ - 2 Score 0 0  Altered sleeping - 0  Tired, decreased energy - 0  Change in appetite - 0  Feeling bad or failure about yourself  - 0  Trouble concentrating - 0  Moving slowly or fidgety/restless - 0  Suicidal thoughts - 0  PHQ-9 Score - 0  Difficult doing work/chores - Not difficult at all  Some recent data might be hidden    Fall Risk  08/04/2019 01/05/2019  Falls in the past year? 0 0  Number falls in past yr: 0 0    Injury with Fall? 0 0  Follow up Falls evaluation completed -     Allergies  Allergen Reactions  . Nsaids Other (See Comments)    Told not to take meds-per patient    Prior to Admission medications   Medication Sig Start Date End Date Taking? Authorizing Provider  acetaminophen (TYLENOL) 325 MG tablet Take 2 tablets (650 mg total) by mouth every 6 (six) hours as needed. 05/20/18  Yes Khatri, Hina, PA-C  aspirin 81 MG tablet Take 81 mg by mouth daily.   Yes [provider]  Cholecalciferol (VITAMIN D) 50 MCG (2000 UT) tablet Take 1 tablet (2,000 Units total) by mouth 2 (two) times daily. 11/16/18  Yes InIsaiah SergeNP  Ferrous Sulfate (IRON) 325 (65 Fe) MG TABS Take 1 tablet by mouth once daily 02/16/19  Yes InIsaiah SergeNP  furosemide (LASIX) 40 MG tablet Take 1 tablet (40 mg total) by mouth 2 (two) times daily. 07/11/19 10/09/19 Yes QuVerta Ellen NP  metoprolol succinate (TOPROL-XL) 50 MG 24 hr tablet Take 1 tablet (50 mg total) by mouth daily. 11/16/18  Yes InIsaiah SergeNP  Multiple Vitamins-Minerals (CENTRUM SILVER ADULT 50+) TABS 1 qd   Yes [provider]  oxyCODONE-acetaminophen (PERCOCET) 10-325 MG tablet Take 1 tablet by mouth 4 (four) times daily as needed for pain.  05/11/19  Yes [provider]  potassium chloride (K-DUR) 10 MEQ tablet Take 1 tablet (10 mEq total) by mouth daily. 11/16/18 11/11/19 Yes Isaiah Serge, NP  pregabalin (LYRICA) 75 MG capsule Take 1 capsule (75 mg total) by mouth 2 (two) times daily. 07/26/19 08/25/19 Yes Nafziger, Tommi Rumps, NP  vitamin C (ASCORBIC ACID) 500 MG tablet Take 500 mg by mouth daily.   Yes [provider]  warfarin (COUMADIN) 5 MG tablet TAKE 1.5 TO 2 TABLETS DAILY AS DIRECTED BY COUMADIN CLINIC 01/14/19  Yes Sherren Mocha, MD  atorvastatin (LIPITOR) 20 MG tablet Take 1 tablet (20 mg total) by mouth daily. 11/16/18 02/14/19  Isaiah Serge, NP    Past Medical History:  Diagnosis Date  . Ascending aortic  aneurysm and dissection    2006 repaired  7 cm aneurysm  . Chest pain, atypical   . Coronary artery reimplantation   . Hyperlipidemia    Mixed  . Hypertension    Unspecified  . Nuclear stress test    Myoview 08/2018:  EF 58, no scar or ischemia; Low Risk  . Obesity   . Psoriasis   . S/P aortic valve and root replacement    HX of St. Jude  . Seizures ([place])    one time incident 2018  . Sleep apnea, obstructive     Past Surgical History:  Procedure Laterality Date  . Ascending aortic dissection aneurysm  12/06/2004   ok for MRI 1.5 or 3T Using a 27-mm St. Jude mechanical valve conduit with reimplantation of both coronary arteries  . CORNEAL TRANSPLANT     R   . KNEE SURGERY     R  . Replacement of aortic valve  12/06/2004    Social History   Tobacco Use  . Smoking status: Former Smoker    Packs/day: 0.10    Years: 18.00    Pack years: 1.80    Types: Cigarettes    Quit date: 12/07/1989    Years since quitting: 29.6  . Smokeless tobacco: Former Network engineer Use Topics  . Alcohol use: No    Comment: Quit 1991    Family History  Problem Relation Age of Onset  . Cervical cancer Mother   . Coronary artery disease Mother   . Diabetes Mother   . Diabetes Father   . Cancer Father        lung cancer   . Obesity Father   . Cervical cancer Sister   . Cancer Brother        lung cancer  . Kidney disease Other        dialysis    Review of Systems  Constitutional: Negative for chills and fever.  Respiratory: Negative for cough and shortness of breath.   Cardiovascular: Positive for leg swelling. Negative for chest pain, palpitations, orthopnea and PND.  Gastrointestinal: Negative for abdominal pain, nausea and vomiting.     OBJECTIVE:  Today's Vitals   08/04/19 1506  BP: 126/76  Pulse: 60  Resp: 20  Temp: 97.6 F (36.4 C)  TempSrc: Temporal  SpO2: 98%  Weight: (!) 351 lb (159.2 kg)  Height: 6' 2" (1.88 m)   Body mass index is 45.07 kg/m.  Wt Readings  from Last 3 Encounters:  08/04/19 (!) 351 lb (159.2 kg)  07/11/19 (!) 336 lb (152.4 kg)  05/20/19 (!) 349 lb 12.8 oz (158.7 kg)    Physical Exam Vitals and nursing note reviewed.  Constitutional:      Appearance:  He is well-developed.  HENT:     Head: Normocephalic and atraumatic.  Eyes:     Conjunctiva/sclera: Conjunctivae normal.     Pupils: Pupils are equal, round, and reactive to light.  Cardiovascular:     Rate and Rhythm: Normal rate and regular rhythm.     Heart sounds: No murmur. No friction rub. No gallop.   Pulmonary:     Effort: Pulmonary effort is normal.     Breath sounds: Normal breath sounds. No wheezing, rhonchi or rales.  Musculoskeletal:     Cervical back: Neck supple.     Right lower leg: Edema (+2 pitting edema) present.     Left lower leg: Edema present.  Skin:    General: Skin is warm and dry.  Neurological:     Mental Status: He is alert and oriented to person, place, and time.     No results found for this or any previous visit (from the past 24 hour(s)).  No results found.   ASSESSMENT and PLAN  1. Essential hypertension, benign Controlled. Continue current regime.  - CMP14+EGFR - Care order/instruction: - Care order/instruction:  2. Mixed hyperlipidemia Checking labs today, medications will be adjusted as needed.  - Lipid panel  3. Chronic diastolic heart failure (HCC) Increase weight gain and worsening edema, increase lasix 23m BID. Discussed importance of low salt diet.  4. Paroxysmal atrial fibrillation (HCC) Rate controlled on BB. Anticoagulated with coumadin at for pafib and mechanical valve.  5. Chronic pain syndrome - Ambulatory referral to Pain Clinic  6. Obstructive sleep apnea - Ambulatory referral to Pulmonology  7. Prediabetes Checking labs today, medications will be started as needed.  - Hemoglobin A1c  Other orders - furosemide (LASIX) 40 MG tablet; Take 1.5 tablets (60 mg total) by mouth 2 (two) times  daily.  Return in about 2 weeks (around 08/18/2019).    IRutherford Guys MD [facility] [id number].  [phone] [phone]

## 2019-08-04 NOTE — Patient Instructions (Signed)
° ° ° °  If you have lab work done today you will be contacted with your lab results within the next 2 weeks.  If you have not heard from us then please contact us. The fastest way to get your results is to register for My Chart. ° ° °IF you received an x-ray today, you will receive an invoice from [facility]. Please contact [facility] at [phone] with questions or concerns regarding your invoice.  ° °IF you received labwork today, you will receive an invoice from [facility]. Please contact [facility] at [phone] with questions or concerns regarding your invoice.  ° °Our billing staff will not be able to assist you with questions regarding bills from these companies. ° °You will be contacted with the lab results as soon as they are available. The fastest way to get your results is to activate your My Chart account. Instructions are located on the last page of this paperwork. If you have not heard from us regarding the results in 2 weeks, please contact this office. °  ° ° ° °

## 2019-08-05 LAB — CMP14+EGFR
ALT: 26 IU/L (ref 0–44)
AST: 13 IU/L (ref 0–40)
Albumin/Globulin Ratio: 1.2 (ref 1.2–2.2)
Albumin: 3.8 g/dL (ref 3.8–4.8)
Alkaline Phosphatase: 92 IU/L (ref 39–117)
BUN/Creatinine Ratio: 13 (ref 10–24)
BUN: 10 mg/dL (ref 8–27)
Bilirubin Total: 0.3 mg/dL (ref 0.0–1.2)
CO2: 26 mmol/L (ref 20–29)
Calcium: 8.9 mg/dL (ref 8.6–10.2)
Chloride: 106 mmol/L (ref 96–106)
Creatinine, Ser: 0.8 mg/dL (ref 0.76–1.27)
GFR calc Af Amer: 111 mL/min/{1.73_m2} (ref >59)
GFR calc non Af Amer: 96 mL/min/{1.73_m2} (ref >59)
Globulin, Total: 3.1 g/dL (ref 1.5–4.5)
Glucose: 90 mg/dL (ref 65–99)
Potassium: 4.3 mmol/L (ref 3.5–5.2)
Sodium: 144 mmol/L (ref 134–144)
Total Protein: 6.9 g/dL (ref 6.0–8.5)

## 2019-08-05 LAB — LIPID PANEL
Chol/HDL Ratio: 3.2 ratio (ref 0.0–5.0)
Cholesterol, Total: 153 mg/dL (ref 100–199)
HDL: 48 mg/dL (ref >39)
LDL Chol Calc (NIH): 86 mg/dL (ref 0–99)
Triglycerides: 105 mg/dL (ref 0–149)
VLDL Cholesterol Cal: 19 mg/dL (ref 5–40)

## 2019-08-05 LAB — HEMOGLOBIN A1C
Est. average glucose Bld gHb Est-mCnc: 123 mg/dL
Hgb A1c MFr Bld: 5.9 % — ABNORMAL HIGH (ref 4.8–5.6)

## 2019-08-10 ENCOUNTER — Telehealth: Payer: Self-pay | Admitting: Family Medicine

## 2019-08-10 NOTE — Telephone Encounter (Signed)
Pt is wanting to make sure his pain management referral is in [place]. Please advise at [phone].

## 2019-08-19 ENCOUNTER — Other Ambulatory Visit: Payer: Self-pay

## 2019-08-19 ENCOUNTER — Encounter: Payer: Self-pay | Admitting: Family Medicine

## 2019-08-19 ENCOUNTER — Ambulatory Visit: Payer: 59 | Admitting: Family Medicine

## 2019-08-19 VITALS — BP 128/82 | HR 81 | Temp 97.7°F | Ht 74.0 in | Wt 343.0 lb

## 2019-08-19 DIAGNOSIS — G894 Chronic pain syndrome: Secondary | ICD-10-CM

## 2019-08-19 DIAGNOSIS — E782 Mixed hyperlipidemia: Secondary | ICD-10-CM

## 2019-08-19 DIAGNOSIS — I5032 Chronic diastolic (congestive) heart failure: Secondary | ICD-10-CM | POA: Diagnosis not present

## 2019-08-19 DIAGNOSIS — R7303 Prediabetes: Secondary | ICD-10-CM

## 2019-08-19 DIAGNOSIS — G589 Mononeuropathy, unspecified: Secondary | ICD-10-CM

## 2019-08-19 MED ORDER — OXYCODONE-ACETAMINOPHEN 10-325 MG PO TABS: 1.0000 | ORAL_TABLET | Freq: Four times a day (QID) | ORAL | 0 refills | Status: DC | PRN

## 2019-08-19 MED ORDER — PREGABALIN 75 MG PO CAPS: 75.0000 mg | ORAL_CAPSULE | Freq: Two times a day (BID) | ORAL | 0 refills | Status: DC

## 2019-08-19 MED ORDER — FUROSEMIDE 40 MG PO TABS: 40.0000 mg | ORAL_TABLET | Freq: Two times a day (BID) | ORAL | 0 refills | Status: DC

## 2019-08-19 NOTE — Patient Instructions (Signed)
° ° ° °  If you have lab work done today you will be contacted with your lab results within the next 2 weeks.  If you have not heard from us then please contact us. The fastest way to get your results is to register for My Chart. ° ° °IF you received an x-ray today, you will receive an invoice from [facility]. Please contact [facility] at [phone] with questions or concerns regarding your invoice.  ° °IF you received labwork today, you will receive an invoice from [facility]. Please contact [facility] at [phone] with questions or concerns regarding your invoice.  ° °Our billing staff will not be able to assist you with questions regarding bills from these companies. ° °You will be contacted with the lab results as soon as they are available. The fastest way to get your results is to activate your My Chart account. Instructions are located on the last page of this paperwork. If you have not heard from us regarding the results in 2 weeks, please contact this office. °  ° ° ° °

## 2019-08-19 NOTE — Progress Notes (Signed)
3/12/202110:48 AM  [name] Class 19-Jun-1960, 59 y.o., male [id number]  Chief Complaint  Patient presents with  . Hypertension  . low back pain, both knees    pain medication refill    HPI:   Patient is a 59 y.o. male with past medical history significant for HTN, prediabetes, HLP, mechanical AV replacement on coumadin, dHF, AAA repair, OSA on cpap, GERD, chronic pain (DDD lumbar spine with radiculopathy and bilateral knee OA) and psoriasis  who presents today for followup  Last OV 2 weeks ago - increased lasix to 60mg  BID due to increased weight and edema - he has been weighing himself daily, reports weight loss, leg edema slightly better referred to pain mgt and pulm Has upcoming appt with pain mgt Has not heard from pulm yet Will get his second covid shot next week Has been walking a bit more despite the pain  Lab Results  Component Value Date   HGBA1C 5.9 (H) 08/04/2019   Lab Results  Component Value Date   CREATININE 0.80 08/04/2019   BUN 10 08/04/2019   NA 144 08/04/2019   K 4.3 08/04/2019   CL 106 08/04/2019   CO2 26 08/04/2019   Lab Results  Component Value Date   CHOL 153 08/04/2019   HDL 48 08/04/2019   LDLCALC 86 08/04/2019   TRIG 105 08/04/2019   CHOLHDL 3.2 08/04/2019   Lab Results  Component Value Date   ALT 26 08/04/2019   AST 13 08/04/2019   ALKPHOS 92 08/04/2019   BILITOT 0.3 08/04/2019    Depression screen [facility] 2/9 08/19/2019 08/04/2019 01/05/2019  Decreased Interest 0 0 0  Down, Depressed, Hopeless 0 0 0  PHQ - 2 Score 0 0 0  Altered sleeping - - 0  Tired, decreased energy - - 0  Change in appetite - - 0  Feeling bad or failure about yourself  - - 0  Trouble concentrating - - 0  Moving slowly or fidgety/restless - - 0  Suicidal thoughts - - 0  PHQ-9 Score - - 0  Difficult doing work/chores - - Not difficult at all  Some recent data might be hidden    Fall Risk  08/19/2019 08/04/2019 01/05/2019  Falls in the past year? 0 0 0    Number falls in past yr: 0 0 0  Injury with Fall? 0 0 0  Follow up Falls evaluation completed Falls evaluation completed -     Allergies  Allergen Reactions  . Nsaids Other (See Comments)    Told not to take meds-per patient    Prior to Admission medications   Medication Sig Start Date End Date Taking? Authorizing Provider  acetaminophen (TYLENOL) 325 MG tablet Take 2 tablets (650 mg total) by mouth every 6 (six) hours as needed. 05/20/18  Yes Khatri, Hina, PA-C  aspirin 81 MG tablet Take 81 mg by mouth daily.   Yes [provider]  atorvastatin (LIPITOR) 20 MG tablet Take 1 tablet (20 mg total) by mouth daily. 11/16/18 08/19/19 Yes 10/19/19, NP  Cholecalciferol (VITAMIN D) 50 MCG (2000 UT) tablet Take 1 tablet (2,000 Units total) by mouth 2 (two) times daily. 11/16/18  Yes 01/16/19, NP  Ferrous Sulfate (IRON) 325 (65 Fe) MG TABS Take 1 tablet by mouth once daily 02/16/19  Yes 04/18/19, NP  furosemide (LASIX) 40 MG tablet Take 1.5 tablets (60 mg total) by mouth 2 (two) times daily. 08/04/19  Yes 08/06/19, MD  metoprolol succinate (TOPROL-XL) 50 MG 24 hr tablet Take 1 tablet (50 mg total) by mouth daily. 11/16/18  Yes Leone Brand, NP  Multiple Vitamins-Minerals (CENTRUM SILVER ADULT 50+) TABS 1 qd   Yes [provider]  oxyCODONE-acetaminophen (PERCOCET) 10-325 MG tablet Take 1 tablet by mouth 4 (four) times daily as needed for pain.  05/11/19  Yes [provider]  potassium chloride (K-DUR) 10 MEQ tablet Take 1 tablet (10 mEq total) by mouth daily. 11/16/18 11/11/19 Yes Leone Brand, NP  pregabalin (LYRICA) 75 MG capsule Take 1 capsule (75 mg total) by mouth 2 (two) times daily. 07/26/19 08/25/19 Yes Nafziger, Kandee Keen, NP  Secukinumab, 300 MG Dose, (COSENTYX, 300 MG DOSE,) 150 MG/ML SOSY Inject 300 mg into the skin every 28 (twenty-eight) days.   Yes Specialists, Dermatology  vitamin C (ASCORBIC ACID) 500 MG tablet Take 500 mg by mouth daily.    Yes [provider]  warfarin (COUMADIN) 5 MG tablet TAKE 1.5 TO 2 TABLETS DAILY AS DIRECTED BY COUMADIN CLINIC 01/14/19  Yes Tonny Bollman, MD    Past Medical History:  Diagnosis Date  . Ascending aortic aneurysm and dissection    2006 repaired  7 cm aneurysm  . Chest pain, atypical   . Coronary artery reimplantation   . Hyperlipidemia    Mixed  . Hypertension    Unspecified  . Nuclear stress test    Myoview 08/2018:  EF 58, no scar or ischemia; Low Risk  . Obesity   . Psoriasis   . S/P aortic valve and root replacement    HX of [employer] Jude  . Seizures (HCC)    one time incident 2018  . Sleep apnea, obstructive     Past Surgical History:  Procedure Laterality Date  . Ascending aortic dissection aneurysm  12/06/2004   ok for MRI 1.5 or 3T Using a 27-mm [employer] Jude mechanical valve conduit with reimplantation of both coronary arteries  . CORNEAL TRANSPLANT     R   . KNEE SURGERY     R  . Replacement of aortic valve  12/06/2004    Social History   Tobacco Use  . Smoking status: Former Smoker    Packs/day: 0.10    Years: 18.00    Pack years: 1.80    Types: Cigarettes    Quit date: 12/07/1989    Years since quitting: 29.7  . Smokeless tobacco: Former Engineer, water Use Topics  . Alcohol use: No    Comment: Quit 1991    Family History  Problem Relation Age of Onset  . Cervical cancer Mother   . Coronary artery disease Mother   . Diabetes Mother   . Diabetes Father   . Cancer Father        lung cancer   . Obesity Father   . Cervical cancer Sister   . Cancer Brother        lung cancer  . Kidney disease Other        dialysis    Review of Systems  Constitutional: Negative for chills and fever.  Respiratory: Negative for cough and shortness of breath.   Cardiovascular: Positive for leg swelling. Negative for chest pain and palpitations.  Gastrointestinal: Negative for abdominal pain, nausea and vomiting.     OBJECTIVE:  Today's Vitals   08/19/19  1044  BP: 128/82  Pulse: 81  Temp: 97.7 F (36.5 C)  SpO2: 98%  Weight: (!) 343 lb (155.6 kg)  Height: 6\' 2"  (1.88  m)   Body mass index is 44.04 kg/m.  Wt Readings from Last 3 Encounters:  08/19/19 (!) 343 lb (155.6 kg)  08/04/19 (!) 351 lb (159.2 kg)  07/11/19 (!) 336 lb (152.4 kg)    Physical Exam Vitals and nursing note reviewed.  Constitutional:      Appearance: He is well-developed.  HENT:     Head: Normocephalic and atraumatic.  Eyes:     Conjunctiva/sclera: Conjunctivae normal.     Pupils: Pupils are equal, round, and reactive to light.  Cardiovascular:     Rate and Rhythm: Normal rate and regular rhythm.     Heart sounds: No murmur. No friction rub. No gallop.   Pulmonary:     Effort: Pulmonary effort is normal.     Breath sounds: Normal breath sounds. No wheezing or rales.  Musculoskeletal:     Cervical back: Neck supple.     Right lower leg: Edema (pitting upto knees, + 1, chronic stasis changes) present.     Left lower leg: Edema (+1 pitting upto knees, chronic stasis changes) present.  Skin:    General: Skin is warm and dry.  Neurological:     Mental Status: He is alert and oriented to person, place, and time.     No results found for this or any previous visit (from the past 24 hour(s)).  No results found.   ASSESSMENT and PLAN  1. Chronic diastolic heart failure (HCC) Improved weight and edema. Discussed decreasing lasix back to 40mg  BID next week, cont with low salt, elevation and daily BP monitoring.  - Basic Metabolic Panel  2. Chronic pain syndrome 3. Mononeuropathy pmp reviewed. Refill meds for this month. Has upcoming appt to establish care with new pain mgt provider - pregabalin (LYRICA) 75 MG capsule; Take 1 capsule (75 mg total) by mouth 2 (two) times daily.  4. Mixed hyperlipidemia Controlled. Continue current regime.   5. Prediabetes At goal.  Other orders - oxyCODONE-acetaminophen (PERCOCET) 10-325 MG tablet; Take 1 tablet  by mouth every 6 (six) hours as needed for pain.  Return in about 4 weeks (around 09/16/2019).    Rutherford Guys, MD [facility] [place], [place] [address] Ph.  [phone] Fax [phone]

## 2019-08-20 LAB — BASIC METABOLIC PANEL
BUN/Creatinine Ratio: 18 (ref 10–24)
BUN: 16 mg/dL (ref 8–27)
CO2: 25 mmol/L (ref 20–29)
Calcium: 9.2 mg/dL (ref 8.6–10.2)
Chloride: 105 mmol/L (ref 96–106)
Creatinine, Ser: 0.91 mg/dL (ref 0.76–1.27)
GFR calc Af Amer: 105 mL/min/{1.73_m2} (ref >59)
GFR calc non Af Amer: 91 mL/min/{1.73_m2} (ref >59)
Glucose: 100 mg/dL — ABNORMAL HIGH (ref 65–99)
Potassium: 4 mmol/L (ref 3.5–5.2)
Sodium: 145 mmol/L — ABNORMAL HIGH (ref 134–144)

## 2019-08-23 ENCOUNTER — Telehealth: Payer: Self-pay | Admitting: Family Medicine

## 2019-08-23 NOTE — Telephone Encounter (Signed)
Pt states that pharmacy is telling him that pregabalin (LYRICA) 75 MG capsule requires prior authorization before he can get his medication.

## 2019-08-24 NOTE — Telephone Encounter (Signed)
Please Advise

## 2019-08-25 NOTE — Telephone Encounter (Signed)
Key [id number]    Pregablin

## 2019-09-02 ENCOUNTER — Telehealth: Payer: Self-pay | Admitting: Family Medicine

## 2019-09-02 NOTE — Telephone Encounter (Signed)
Pt received a letter from his insurance company in regard to denial of coverage for lyrica. Ins will not cover until they receive more information from the provider. Pt will come in office to drop off paperwork. FYI

## 2019-09-05 ENCOUNTER — Other Ambulatory Visit: Payer: Self-pay

## 2019-09-05 ENCOUNTER — Ambulatory Visit
Payer: 59 | Attending: Student in an Organized Health Care Education/Training Program | Admitting: Student in an Organized Health Care Education/Training Program

## 2019-09-05 ENCOUNTER — Encounter: Payer: Self-pay | Admitting: Student in an Organized Health Care Education/Training Program

## 2019-09-05 VITALS — BP 134/77 | HR 53 | Temp 97.3°F | Resp 16 | Ht 74.0 in | Wt 338.0 lb

## 2019-09-05 DIAGNOSIS — M1712 Unilateral primary osteoarthritis, left knee: Secondary | ICD-10-CM | POA: Diagnosis present

## 2019-09-05 DIAGNOSIS — M5416 Radiculopathy, lumbar region: Secondary | ICD-10-CM | POA: Diagnosis not present

## 2019-09-05 DIAGNOSIS — M47816 Spondylosis without myelopathy or radiculopathy, lumbar region: Secondary | ICD-10-CM | POA: Insufficient documentation

## 2019-09-05 DIAGNOSIS — G894 Chronic pain syndrome: Secondary | ICD-10-CM | POA: Insufficient documentation

## 2019-09-05 DIAGNOSIS — M1612 Unilateral primary osteoarthritis, left hip: Secondary | ICD-10-CM | POA: Diagnosis present

## 2019-09-05 DIAGNOSIS — M1711 Unilateral primary osteoarthritis, right knee: Secondary | ICD-10-CM

## 2019-09-05 DIAGNOSIS — M5136 Other intervertebral disc degeneration, lumbar region: Secondary | ICD-10-CM | POA: Insufficient documentation
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
## 2019-09-07 ENCOUNTER — Other Ambulatory Visit: Payer: Self-pay

## 2019-09-07 ENCOUNTER — Ambulatory Visit (INDEPENDENT_AMBULATORY_CARE_PROVIDER_SITE_OTHER): Payer: 59 | Admitting: Pharmacist

## 2019-09-07 DIAGNOSIS — Z7901 Long term (current) use of anticoagulants: Secondary | ICD-10-CM | POA: Diagnosis not present

## 2019-09-07 DIAGNOSIS — I359 Nonrheumatic aortic valve disorder, unspecified: Secondary | ICD-10-CM

## 2019-09-07 DIAGNOSIS — Z952 Presence of prosthetic heart valve: Secondary | ICD-10-CM

## 2019-09-07 LAB — POCT INR: INR: 2.8 (ref 2.0–3.0)

## 2019-09-07 NOTE — Patient Instructions (Signed)
Description   Continue taking 1.5 tablets daily except 2 tablets on Fridays.  Recheck INR in 5 weeks-pending ESI, needs Lovenox bridge. Call us when your procedure date is set [phone] & Fax number is [phone]

## 2019-09-09 ENCOUNTER — Telehealth: Payer: Self-pay | Admitting: Cardiovascular Disease

## 2019-09-09 LAB — COMPLIANCE DRUG ANALYSIS, UR

## 2019-09-09 NOTE — Telephone Encounter (Signed)
Advised patient to take 2.5 tablets tonight (extra 1/2 tab) and 2 tablets tomorrow (extra 1/2 tab) and then continue his normal dose. INR check moved up a little bit to 4/21 since he sees laura that day.

## 2019-09-09 NOTE — Telephone Encounter (Signed)
Pt c/o medication issue:  1. Name of Medication: warfarin (COUMADIN) 5 MG tablet  2. How are you currently taking this medication (dosage and times per day)? As directed  3. Are you having a reaction (difficulty breathing--STAT)? no  4. What is your medication issue? Patient missed his first dose of the medication yesterday and wanted to know what to do to make up for the missed dose. Please advise

## 2019-09-14 ENCOUNTER — Encounter: Payer: Self-pay | Admitting: Family Medicine

## 2019-09-14 ENCOUNTER — Other Ambulatory Visit: Payer: Self-pay

## 2019-09-14 ENCOUNTER — Ambulatory Visit: Payer: 59 | Admitting: Family Medicine

## 2019-09-14 VITALS — BP 138/70 | HR 54 | Temp 97.8°F | Ht 74.0 in | Wt 338.2 lb

## 2019-09-14 DIAGNOSIS — G894 Chronic pain syndrome: Secondary | ICD-10-CM

## 2019-09-14 DIAGNOSIS — I5032 Chronic diastolic (congestive) heart failure: Secondary | ICD-10-CM

## 2019-09-14 DIAGNOSIS — Z6841 Body Mass Index (BMI) 40.0 and over, adult: Secondary | ICD-10-CM | POA: Diagnosis not present

## 2019-09-14 LAB — BASIC METABOLIC PANEL
BUN/Creatinine Ratio: 15 (ref 10–24)
BUN: 12 mg/dL (ref 8–27)
CO2: 25 mmol/L (ref 20–29)
Calcium: 9.3 mg/dL (ref 8.6–10.2)
Chloride: 107 mmol/L — ABNORMAL HIGH (ref 96–106)
Creatinine, Ser: 0.8 mg/dL (ref 0.76–1.27)
GFR calc Af Amer: 111 mL/min/{1.73_m2} (ref >59)
GFR calc non Af Amer: 96 mL/min/{1.73_m2} (ref >59)
Glucose: 113 mg/dL — ABNORMAL HIGH (ref 65–99)
Potassium: 3.8 mmol/L (ref 3.5–5.2)
Sodium: 148 mmol/L — ABNORMAL HIGH (ref 134–144)

## 2019-09-14 MED ORDER — OXYCODONE-ACETAMINOPHEN 10-325 MG PO TABS: 1.0000 | ORAL_TABLET | Freq: Four times a day (QID) | ORAL | 0 refills | Status: DC | PRN

## 2019-09-14 MED ORDER — FUROSEMIDE 40 MG PO TABS: 60.0000 mg | ORAL_TABLET | Freq: Two times a day (BID) | ORAL | 1 refills | Status: DC

## 2019-09-14 MED ORDER — POTASSIUM CHLORIDE ER 10 MEQ PO CPCR: 10.0000 meq | ORAL_CAPSULE | Freq: Every day | ORAL | 1 refills | Status: DC

## 2019-09-14 NOTE — Patient Instructions (Signed)
° ° ° °  If you have lab work done today you will be contacted with your lab results within the next 2 weeks.  If you have not heard from us then please contact us. The fastest way to get your results is to register for My Chart. ° ° °IF you received an x-ray today, you will receive an invoice from [facility]. Please contact [facility] at [phone] with questions or concerns regarding your invoice.  ° °IF you received labwork today, you will receive an invoice from [facility]. Please contact [facility] at [phone] with questions or concerns regarding your invoice.  ° °Our billing staff will not be able to assist you with questions regarding bills from these companies. ° °You will be contacted with the lab results as soon as they are available. The fastest way to get your results is to activate your My Chart account. Instructions are located on the last page of this paperwork. If you have not heard from us regarding the results in 2 weeks, please contact this office. °  ° ° ° °

## 2019-09-14 NOTE — Progress Notes (Signed)
4/7/202111:04 AM  [name] [birth year], 59 y.o., male [id number]  Chief Complaint  Patient presents with  . Medical Management of Chronic Issues    1 m f.u     HPI:   Patient is a 59 y.o. male with past medical history significant for HTN, prediabetes, HLP, mechanical AV replacement on coumadin, dHF, AAA repair, OSA on cpap, GERD, chronic pain (DDD lumbar spine with radiculopathy and bilateral knee OA) and psoriasis who presents today for followup  Last OV 2 weeks ago Overall doing well continues to be on lasix 60mg  BID Swelling better, using compression stockings Has established with pain mgt - referred to psychiatry - has not taken over meds, next appt needs to be scheduled Needs refill of percocet, pmp reviewed Coumadin managed by coumadin clinic Sees cards in 2 weeks Requesting referral to medical weight mgt Has no acute concerns today  Lab Results  Component Value Date   CREATININE 0.91 08/19/2019   BUN 16 08/19/2019   NA 145 (H) 08/19/2019   K 4.0 08/19/2019   CL 105 08/19/2019   CO2 25 08/19/2019    Depression screen PHQ 2/9 09/14/2019 09/05/2019 08/19/2019  Decreased Interest 0 0 0  Down, Depressed, Hopeless 0 0 0  PHQ - 2 Score 0 0 0  Altered sleeping - - -  Tired, decreased energy - - -  Change in appetite - - -  Feeling bad or failure about yourself  - - -  Trouble concentrating - - -  Moving slowly or fidgety/restless - - -  Suicidal thoughts - - -  PHQ-9 Score - - -  Difficult doing work/chores - - -  Some recent data might be hidden    Fall Risk  09/14/2019 09/05/2019 08/19/2019 08/04/2019 01/05/2019  Falls in the past year? 0 0 0 0 0  Number falls in past yr: 0 - 0 0 0  Injury with Fall? 0 - 0 0 0  Follow up Falls evaluation completed - Falls evaluation completed Falls evaluation completed -     Allergies  Allergen Reactions  . Nsaids Other (See Comments)    Told not to take meds-per patient    Prior to Admission medications     Medication Sig Start Date End Date Taking? Authorizing Provider  acetaminophen (TYLENOL) 325 MG tablet Take 2 tablets (650 mg total) by mouth every 6 (six) hours as needed. 05/20/18  Yes Khatri, Hina, PA-C  aspirin 81 MG tablet Take 81 mg by mouth daily.   Yes [provider]  Cholecalciferol (VITAMIN D) 50 MCG (2000 UT) tablet Take 1 tablet (2,000 Units total) by mouth 2 (two) times daily. 11/16/18  Yes 01/16/19, NP  Cholecalciferol (VITAMIN D-3) 25 MCG (1000 UT) CAPS Take by mouth in the morning and at bedtime.   Yes [provider]  Ferrous Sulfate (IRON) 325 (65 Fe) MG TABS Take 1 tablet by mouth once daily 02/16/19  Yes 04/18/19, NP  furosemide (LASIX) 40 MG tablet Take 1 tablet (40 mg total) by mouth 2 (two) times daily. 08/19/19  Yes 10/19/19, MD  metoprolol succinate (TOPROL-XL) 50 MG 24 hr tablet Take 1 tablet (50 mg total) by mouth daily. 11/16/18  Yes 01/16/19, NP  Multiple Vitamins-Minerals (CENTRUM SILVER ADULT 50+) TABS 1 qd   Yes [provider]  oxyCODONE-acetaminophen (PERCOCET) 10-325 MG tablet Take 1 tablet by mouth every 6 (six) hours as needed for pain. 08/19/19  Yes 10/19/19, Leretha Pol  M, MD  potassium chloride (K-DUR) 10 MEQ tablet Take 1 tablet (10 mEq total) by mouth daily. 11/16/18 11/11/19 Yes Isaiah Serge, NP  pregabalin (LYRICA) 75 MG capsule Take 1 capsule (75 mg total) by mouth 2 (two) times daily. 08/19/19 09/18/19 Yes Rutherford Guys, MD  Secukinumab, 300 MG Dose, (COSENTYX, 300 MG DOSE,) 150 MG/ML SOSY Inject 300 mg into the skin every 28 (twenty-eight) days.   Yes Specialists, Dermatology  vitamin C (ASCORBIC ACID) 500 MG tablet Take 500 mg by mouth daily.   Yes [provider]  warfarin (COUMADIN) 5 MG tablet TAKE 1.5 TO 2 TABLETS DAILY AS DIRECTED BY COUMADIN CLINIC 01/14/19  Yes Sherren Mocha, MD  atorvastatin (LIPITOR) 20 MG tablet Take 1 tablet (20 mg total) by mouth daily. 11/16/18 08/19/19  Isaiah Serge, NP     Past Medical History:  Diagnosis Date  . Ascending aortic aneurysm and dissection    2006 repaired  7 cm aneurysm  . Chest pain, atypical   . Coronary artery reimplantation   . Hyperlipidemia    Mixed  . Hypertension    Unspecified  . Nuclear stress test    Myoview 08/2018:  EF 58, no scar or ischemia; Low Risk  . Obesity   . Psoriasis   . S/P aortic valve and root replacement    HX of St. Jude  . Seizures ([place])    one time incident 2018  . Sleep apnea, obstructive     Past Surgical History:  Procedure Laterality Date  . Ascending aortic dissection aneurysm  12/06/2004   ok for MRI 1.5 or 3T Using a 27-mm St. Jude mechanical valve conduit with reimplantation of both coronary arteries  . CORNEAL TRANSPLANT     R   . KNEE SURGERY     R  . Replacement of aortic valve  12/06/2004    Social History   Tobacco Use  . Smoking status: Former Smoker    Packs/day: 0.10    Years: 18.00    Pack years: 1.80    Types: Cigarettes    Quit date: 12/07/1989    Years since quitting: 29.7  . Smokeless tobacco: Former Network engineer Use Topics  . Alcohol use: No    Comment: Quit 1991    Family History  Problem Relation Age of Onset  . Cervical cancer Mother   . Coronary artery disease Mother   . Diabetes Mother   . Diabetes Father   . Cancer Father        lung cancer   . Obesity Father   . Cervical cancer Sister   . Cancer Brother        lung cancer  . Kidney disease Other        dialysis    Review of Systems  Constitutional: Negative for chills and fever.  Respiratory: Negative for cough and shortness of breath.   Cardiovascular: Negative for chest pain, palpitations and leg swelling.  Gastrointestinal: Negative for abdominal pain, nausea and vomiting.  Musculoskeletal: Positive for back pain and joint pain.  per hpi   OBJECTIVE:  Today's Vitals   09/14/19 1056  BP: 138/70  Pulse: (!) 54  Temp: 97.8 F (36.6 C)  TempSrc: Temporal  SpO2: 94%  Weight: (!)  338 lb 3.2 oz (153.4 kg)  Height: 6\' 2"  (1.88 m)   Body mass index is 43.42 kg/m.   Wt Readings from Last 3 Encounters:  09/14/19 (!) 338 lb 3.2 oz (153.4 kg)  09/05/19 (!) 338 lb (153.3 kg)  08/19/19 (!) 343 lb (155.6 kg)     Physical Exam Vitals and nursing note reviewed.  Constitutional:      Appearance: He is well-developed.  HENT:     Head: Normocephalic and atraumatic.  Eyes:     Conjunctiva/sclera: Conjunctivae normal.     Pupils: Pupils are equal, round, and reactive to light.  Cardiovascular:     Rate and Rhythm: Normal rate and regular rhythm.     Heart sounds: No murmur. No friction rub. No gallop.   Pulmonary:     Effort: Pulmonary effort is normal.     Breath sounds: Normal breath sounds. No wheezing or rales.  Musculoskeletal:     Cervical back: Neck supple.     Right lower leg: No edema.     Left lower leg: No edema.  Skin:    General: Skin is warm and dry.  Neurological:     Mental Status: He is alert and oriented to person, place, and time.     No results found for this or any previous visit (from the past 24 hour(s)).  No results found.   ASSESSMENT and PLAN  1. Chronic diastolic heart failure (HCC) Euvolemic. Continue current regime. Has upcoming appt with cards - Basic Metabolic Panel  2. Chronic pain syndrome Currently in process of being evaluated by pain mgt. Will refill percocet for this month, anticipating next appt with pain mgt in May - June.   3. BMI 40.0-44.9, adult (HCC) Discussed water based exercises as limited by chronic pain - Amb Ref to Medical Weight Management  Other orders - oxyCODONE-acetaminophen (PERCOCET) 10-325 MG tablet; Take 1 tablet by mouth every 6 (six) hours as needed for pain. - potassium chloride (MICRO-K) 10 MEQ CR capsule; Take 1 capsule (10 mEq total) by mouth daily. - furosemide (LASIX) 40 MG tablet; Take 1.5 tablets (60 mg total) by mouth 2 (two) times daily.  Return in about 3 months (around  12/14/2019).    Myles Lipps, MD Primary Care at [facility] [address] [place], [place] [address] Ph.  [phone] Fax [phone]

## 2019-09-16 ENCOUNTER — Telehealth: Payer: Self-pay | Admitting: Family Medicine

## 2019-09-16 NOTE — Telephone Encounter (Signed)
Pt has been scheduled with Kateri Plummer for Tues. He is having bleeding when he pulls up his pants due to psoriasis and the blood thinners. He says that the blood flow last for 15-20 min. Pt has been instructed to go to er if problems persist or gets worse. Verbalized  understanding.

## 2019-09-16 NOTE — Telephone Encounter (Signed)
Pt called and states he is on blood thinners and every time he pulls up his pants he doesn't know if he has a scab or something but he will start bleeding and it takes about 15 to 20 mins to stop. Pt would like a nurse to give him a call. Please advise. [phone]

## 2019-09-20 ENCOUNTER — Ambulatory Visit: Payer: Self-pay | Admitting: Internal Medicine

## 2019-09-20 ENCOUNTER — Ambulatory Visit: Payer: Self-pay | Admitting: Registered Nurse

## 2019-09-23 ENCOUNTER — Telehealth: Payer: Self-pay | Admitting: *Deleted

## 2019-09-23 NOTE — Telephone Encounter (Signed)
  Patient Consent for Virtual Visit         [name] has provided verbal consent on 09/23/2019 for a virtual visit (video or telephone).   CONSENT FOR VIRTUAL VISIT FOR:  [name]  By participating in this virtual visit I agree to the following:  I hereby voluntarily request, consent and authorize [facility] and its employed or contracted physicians, physician assistants, nurse practitioners or other licensed health care professionals (the Practitioner), to provide me with telemedicine health care services (the "Services") as deemed necessary by the treating Practitioner. I acknowledge and consent to receive the Services by the Practitioner via telemedicine. I understand that the telemedicine visit will involve communicating with the Practitioner through live audiovisual communication technology and the disclosure of certain medical information by electronic transmission. I acknowledge that I have been given the opportunity to request an in-person assessment or other available alternative prior to the telemedicine visit and am voluntarily participating in the telemedicine visit.  I understand that I have the right to withhold or withdraw my consent to the use of telemedicine in the course of my care at any time, without affecting my right to future care or treatment, and that the Practitioner or I may terminate the telemedicine visit at any time. I understand that I have the right to inspect all information obtained and/or recorded in the course of the telemedicine visit and may receive copies of available information for a reasonable fee.  I understand that some of the potential risks of receiving the Services via telemedicine include:  Marland Kitchen Delay or interruption in medical evaluation due to technological equipment failure or disruption; . Information transmitted may not be sufficient (e.g. poor resolution of images) to allow for appropriate medical decision making by the  Practitioner; and/or  . In rare instances, security protocols could fail, causing a breach of personal health information.  Furthermore, I acknowledge that it is my responsibility to provide information about my medical history, conditions and care that is complete and accurate to the best of my ability. I acknowledge that Practitioner's advice, recommendations, and/or decision may be based on factors not within their control, such as incomplete or inaccurate data provided by me or distortions of diagnostic images or specimens that may result from electronic transmissions. I understand that the practice of medicine is not an exact science and that Practitioner makes no warranties or guarantees regarding treatment outcomes. I acknowledge that a copy of this consent can be made available to me via my patient portal [facility] MyChart), or I can request a printed copy by calling the office of [facility].    I understand that my insurance will be billed for this visit.   I have read or had this consent read to me. . I understand the contents of this consent, which adequately explains the benefits and risks of the Services being provided via telemedicine.  . I have been provided ample opportunity to ask questions regarding this consent and the Services and have had my questions answered to my satisfaction. . I give my informed consent for the services to be provided through the use of telemedicine in my medical care

## 2019-09-27 NOTE — Progress Notes (Signed)
Virtual Visit via Telephone Note   This visit type was conducted due to national recommendations for restrictions regarding the COVID-19 Pandemic (e.g. social distancing) in an effort to limit this patient's exposure and mitigate transmission in our community.  Due to his co-morbid illnesses, this patient is at least at moderate risk for complications without adequate follow up.  This format is felt to be most appropriate for this patient at this time.  The patient did not have access to video technology/had technical difficulties with video requiring transitioning to audio format only (telephone).  All issues noted in this document were discussed and addressed.  No physical exam could be performed with this format.  Please refer to the patient's chart for his  consent to telehealth for [facility].   The patient was identified using 2 identifiers.  Date:  09/28/2019   ID:  [name], DOB 09/12/1960, MRN [id number]  Patient Location: Home Provider Location: Office  PCP:  Rutherford Guys, MD  Cardiologist:  Sherren Mocha, MD  Electrophysiologist:  None   Evaluation Performed:  Follow-Up Visit  Chief Complaint:  2 months follow up   History of Present Illness:    [name] is a 59 y.o. male with history significant for aortic valve disease and thoracic aortic aneurysm s/p TAA repair with mechanical aortic valve replacement and reimplantation of coronary arteries in 2006, paroxysmal atrial fibrillation, diastolic heart failure, hypertension, hyperlipidemia, severe OSA on CPAP, morbid obesity, venous insufficiency with associated stasis presented for follow up.   History of volume overload in 2020 secondary to higher salt intake and venous insufficiency.  Last seen virtually by Levell July,  NP 07/11/2019.Marland Kitchen He was not taking his Lasix as prescribed.  Encouraged compliance.  Seen virtually for follow-up.  Patient reports intermittent bleeding from his varicose vein.   He denies chest pain, shortness of breath, orthopnea or PND.  His lower extremity edema is stable.  Reports using low-sodium diet and compliance with his medication.  Stats his potasium cost $44/month.    The patient does not have symptoms concerning for COVID-19 infection (fever, chills, cough, or new shortness of breath).    Past Medical History:  Diagnosis Date  . Ascending aortic aneurysm and dissection    2006 repaired  7 cm aneurysm  . Chest pain, atypical   . Coronary artery reimplantation   . Hyperlipidemia    Mixed  . Hypertension    Unspecified  . Nuclear stress test    Myoview 08/2018:  EF 58, no scar or ischemia; Low Risk  . Obesity   . Psoriasis   . S/P aortic valve and root replacement    HX of St. Jude  . Seizures ([place])    one time incident 2018  . Sleep apnea, obstructive    Past Surgical History:  Procedure Laterality Date  . Ascending aortic dissection aneurysm  12/06/2004   ok for MRI 1.5 or 3T Using a 27-mm St. Jude mechanical valve conduit with reimplantation of both coronary arteries  . CORNEAL TRANSPLANT     R   . KNEE SURGERY     R  . Replacement of aortic valve  12/06/2004     Current Meds  Medication Sig  . acetaminophen (TYLENOL) 325 MG tablet Take 2 tablets (650 mg total) by mouth every 6 (six) hours as needed.  Marland Kitchen aspirin 81 MG tablet Take 81 mg by mouth daily.  Marland Kitchen atorvastatin (LIPITOR) 20 MG tablet Take 1 tablet (20 mg total)  by mouth daily.  . Cholecalciferol (VITAMIN D) 50 MCG (2000 UT) tablet Take 1 tablet (2,000 Units total) by mouth 2 (two) times daily.  . Ferrous Sulfate (IRON) 325 (65 Fe) MG TABS Take 1 tablet by mouth once daily  . furosemide (LASIX) 40 MG tablet Take 1.5 tablets (60 mg total) by mouth 2 (two) times daily.  . metoprolol succinate (TOPROL-XL) 50 MG 24 hr tablet Take 1 tablet (50 mg total) by mouth daily.  . Multiple Vitamins-Minerals (CENTRUM SILVER ADULT 50+) TABS 1 qd  . oxyCODONE-acetaminophen (PERCOCET) 10-325 MG  tablet Take 1 tablet by mouth every 6 (six) hours as needed for pain.  . potassium chloride (MICRO-K) 10 MEQ CR capsule Take 1 capsule (10 mEq total) by mouth daily.  . pregabalin (LYRICA) 75 MG capsule Take 1 capsule (75 mg total) by mouth 2 (two) times daily.  . Secukinumab, 300 MG Dose, (COSENTYX, 300 MG DOSE,) 150 MG/ML SOSY Inject 300 mg into the skin every 28 (twenty-eight) days.  . vitamin C (ASCORBIC ACID) 500 MG tablet Take 1,000 mg by mouth daily.   Marland Kitchen warfarin (COUMADIN) 5 MG tablet TAKE 1.5 TO 2 TABLETS DAILY AS DIRECTED BY COUMADIN CLINIC     Allergies:   Nsaids   Social History   Tobacco Use  . Smoking status: Former Smoker    Packs/day: 0.10    Years: 18.00    Pack years: 1.80    Types: Cigarettes    Quit date: 12/07/1989    Years since quitting: 29.8  . Smokeless tobacco: Former Engineer, water Use Topics  . Alcohol use: No    Comment: Quit 1991  . Drug use: No     Family Hx: The patient's family history includes Cancer in his brother and father; Cervical cancer in his mother and sister; Coronary artery disease in his mother; Diabetes in his father and mother; Kidney disease in an other family member; Obesity in his father.  ROS:   Please see the history of present illness.    All other systems reviewed and are negative.   Prior CV studies:   The following studies were reviewed today:  Echo 01/2018 Study Conclusions   - HPI and indications: Aortic valve replacement (8mm) St. Jude  mechanical valve.  - Left ventricle: The cavity size was normal. There was severe  concentric hypertrophy. Systolic function was normal. The  estimated ejection fraction was in the range of 60% to 65%. Wall  motion was normal; there were no regional wall motion  abnormalities. Doppler parameters are consistent with abnormal  left ventricular relaxation (grade 1 diastolic dysfunction).  - Aortic valve: A mechanical prosthesis was present and functioning  normally.  Peak velocity (S): 331 cm/s. Mean gradient (S): 21 mm  Hg. Valve area (VTI): 1.26 cm^2. Valve area (Vmax): 1.08 cm^2.  Valve area (Vmean): 1.05 cm^2.  - Left atrium: The atrium was moderately dilated.  - Right atrium: The atrium was severely dilated.  - Tricuspid valve: There was trivial regurgitation.  - Pulmonary arteries: Systolic pressure was mildly increased. PA  peak pressure: 32 mm Hg (S).   Stress test 08/2018  Nuclear stress EF: 58%. The left ventricular ejection fraction is normal (55-65%). The LV appears to be mildly dilated.  There was no ST segment deviation noted during stress.  This is a low risk study. Threre is no evidence of ischemia and no evidence of infarction. There is mild LV dilitation with normal LV function   Labs/Other Tests and  Data Reviewed:    EKG:  No ECG reviewed.  Recent Labs: 08/04/2019: ALT 26 09/14/2019: BUN 12; Creatinine, Ser 0.80; Potassium 3.8; Sodium 148   Recent Lipid Panel Lab Results  Component Value Date/Time   CHOL 153 08/04/2019 04:58 PM   TRIG 105 08/04/2019 04:58 PM   HDL 48 08/04/2019 04:58 PM   CHOLHDL 3.2 08/04/2019 04:58 PM   LDLCALC 86 08/04/2019 04:58 PM    Wt Readings from Last 3 Encounters:  09/28/19 (!) 330 lb (149.7 kg)  09/14/19 (!) 338 lb 3.2 oz (153.4 kg)  09/05/19 (!) 338 lb (153.3 kg)     Objective:    Vital Signs:  BP 132/83   Pulse 68   Ht 6\' 2"  (1.88 m)   Wt (!) 330 lb (149.7 kg)   BMI 42.37 kg/m    VITAL SIGNS:  reviewed GEN:  no acute distress PSYCH:  normal affect  ASSESSMENT & PLAN:    1. Chronic bilateral lower extremity edema in setting of venous insufficiency and varicose veins (hx of dietary and medication noncompliance) Patient reports stable edema and compliance with medication and diet.  Continue Lasix at current dose.  Cost of supplement potassium is high.  Spoken with pharmacist and will change capsule to tablet form.  If still high, he will try OTC supplement and have lab work  done by PCP (low cost with PCP per patient). - Normal renal function and K on 09/14/19.  2.  Status post mechanical aortic valve -Denies shortness of breath or syncope. - normal function valve by echo in 2019  3.  Chronic anticoagulation with warfarin -Monitor by Coumadin clinic  4.  Paroxysmal atrial fibrillation -On warfarin for anticoagulation.  Denies palpitation  5.  OSA -On CPAP  6.  Hypertension -Stable and well-controlled on current medications  7.  Intermittent bleeding from varicose vein -Stop aspirin.  Continue warfarin.  He will discuss with PCP if worsening symptoms.  COVID-19 Education: The signs and symptoms of COVID-19 were discussed with the patient and how to seek care for testing (follow up with PCP or arrange E-visit).  The importance of social distancing was discussed today.  Time:   Today, I have spent 11 minutes with the patient with telehealth technology discussing the above problems.     Medication Adjustments/Labs and Tests Ordered: Current medicines are reviewed at length with the patient today.  Concerns regarding medicines are outlined above.   Tests Ordered: No orders of the defined types were placed in this encounter.   Medication Changes: No orders of the defined types were placed in this encounter.   Follow Up:  Either In Person or Virtual in 6 month(s)  Signed, 2020, Manson Passey  09/28/2019 10:40 AM    [facility]

## 2019-09-28 ENCOUNTER — Telehealth (INDEPENDENT_AMBULATORY_CARE_PROVIDER_SITE_OTHER): Payer: 59 | Admitting: Physician Assistant

## 2019-09-28 ENCOUNTER — Other Ambulatory Visit: Payer: Self-pay | Admitting: Cardiovascular Disease

## 2019-09-28 ENCOUNTER — Ambulatory Visit (INDEPENDENT_AMBULATORY_CARE_PROVIDER_SITE_OTHER): Payer: 59

## 2019-09-28 ENCOUNTER — Encounter: Payer: Self-pay | Admitting: Physician Assistant

## 2019-09-28 ENCOUNTER — Other Ambulatory Visit: Payer: Self-pay

## 2019-09-28 VITALS — BP 132/83 | HR 68 | Ht 74.0 in | Wt 330.0 lb

## 2019-09-28 DIAGNOSIS — Z5181 Encounter for therapeutic drug level monitoring: Secondary | ICD-10-CM

## 2019-09-28 DIAGNOSIS — Z7901 Long term (current) use of anticoagulants: Secondary | ICD-10-CM | POA: Diagnosis not present

## 2019-09-28 DIAGNOSIS — I480 Paroxysmal atrial fibrillation: Secondary | ICD-10-CM

## 2019-09-28 DIAGNOSIS — I359 Nonrheumatic aortic valve disorder, unspecified: Secondary | ICD-10-CM | POA: Diagnosis not present

## 2019-09-28 DIAGNOSIS — G4733 Obstructive sleep apnea (adult) (pediatric): Secondary | ICD-10-CM

## 2019-09-28 DIAGNOSIS — Z952 Presence of prosthetic heart valve: Secondary | ICD-10-CM | POA: Diagnosis not present

## 2019-09-28 DIAGNOSIS — I10 Essential (primary) hypertension: Secondary | ICD-10-CM

## 2019-09-28 DIAGNOSIS — I83899 Varicose veins of unspecified lower extremities with other complications: Secondary | ICD-10-CM

## 2019-09-28 DIAGNOSIS — R600 Localized edema: Secondary | ICD-10-CM

## 2019-09-28 LAB — POCT INR: INR: 2.5 (ref 2.0–3.0)

## 2019-09-28 MED ORDER — WARFARIN SODIUM 5 MG PO TABS: ORAL_TABLET | ORAL | 1 refills | Status: DC

## 2019-09-28 NOTE — Patient Instructions (Signed)
Description   Continue on same dosage 1.5 tablets daily except 2 tablets on Fridays.  Recheck INR in 6 weeks. Call us when your procedure date is set [phone] & Fax number is [phone]     

## 2019-09-28 NOTE — Patient Instructions (Signed)
Medication Instructions:  STOP ASPIRIN *If you need a refill on your cardiac medications before your next appointment, please call your pharmacy*   Lab Work:  HAVE LAB WORK DONE IF CHANGE POTASSIUM TO OVER THE COUNTER SUPPLEMENT If you have labs (blood work) drawn today and your tests are completely normal, you will receive your results only by: Marland Kitchen MyChart Message (if you have MyChart) OR . A paper copy in the mail If you have any lab test that is abnormal or we need to change your treatment, we will call you to review the results.   Testing/Procedures: NONE   Follow-Up: At [facility], you and your health needs are our priority.  As part of our continuing mission to provide you with exceptional heart care, we have created designated Provider Care Teams.  These Care Teams include your primary Cardiologist (physician) and Advanced Practice Providers (APPs -  Physician Assistants and Nurse Practitioners) who all work together to provide you with the care you need, when you need it.  We recommend signing up for the patient portal called "MyChart".  Sign up information is provided on this After Visit Summary.  MyChart is used to connect with patients for Virtual Visits (Telemedicine).  Patients are able to view lab/test results, encounter notes, upcoming appointments, etc.  Non-urgent messages can be sent to your provider as well.   To learn more about what you can do with MyChart, go to [email or web address].    Your next appointment:   6 month(s)  The format for your next appointment:   In Person  Provider:   Tonny Bollman, MD   Other Instructions

## 2019-10-05 ENCOUNTER — Telehealth (INDEPENDENT_AMBULATORY_CARE_PROVIDER_SITE_OTHER): Payer: 59 | Admitting: Psychiatry

## 2019-10-05 ENCOUNTER — Other Ambulatory Visit: Payer: Self-pay

## 2019-10-05 ENCOUNTER — Encounter: Payer: Self-pay | Admitting: Psychiatry

## 2019-10-05 DIAGNOSIS — G894 Chronic pain syndrome: Secondary | ICD-10-CM

## 2019-10-05 DIAGNOSIS — Z008 Encounter for other general examination: Secondary | ICD-10-CM | POA: Diagnosis not present

## 2019-10-05 NOTE — Progress Notes (Signed)
Provider Location : [facility] Patient Location : Advertising account executive Visit via Video Note  I connected with [name] on 10/05/19 at  9:00 AM EDT by a video enabled telemedicine application and verified that I am speaking with the correct person using two identifiers.   I discussed the limitations of evaluation and management by telemedicine and the availability of in person appointments. The patient expressed understanding and agreed to proceed.    I discussed the assessment and treatment plan with the patient. The patient was provided an opportunity to ask questions and all were answered. The patient agreed with the plan and demonstrated an understanding of the instructions.   The patient was advised to call back or seek an in-person evaluation if the symptoms worsen or if the condition fails to improve as anticipated.  Psychiatric Initial Adult Assessment   Patient Identification: AIJALON KIRTZ MRN:  [id number] Date of Evaluation:  10/05/2019 Referral Source: Dr.Bilal Lateef Chief Complaint:   Chief Complaint    Psychiatric Evaluation     Visit Diagnosis:    ICD-10-CM   1. Evaluation by psychiatric service required  Z00.8   2. Chronic pain syndrome  G89.4     History of Present Illness:  Mr.Daidone is a 59 year old African-American male, married, on disability, lives in [place], has a history of chronic pain, hyperlipidemia, obesity, obstructive sleep apnea on CPAP, essential hypertension, aortic valve disorder, status post aortic valve replacement, long-term current use of anticoagulants, paroxysmal atrial fibrillation, gastroesophageal reflux disease, chronic diastolic heart failure, bilateral knee pain, lower back pain, history of seizure-1 episode, was evaluated by telemedicine today.  Patient was referred to the clinic for routine assessment of possible mental health/substance abuse was potential prior to initiation of pain management by his pain provider.  Patient  reports he has been struggling with pain since the past 16 years.  He reports he currently struggles with low back pain as well as bilateral knee pain.  He rates his pain at 9 out of 10, 10 being the worst.  He reports he has difficulty doing any activities including walking, bending because of his pain.  He reports he uses a cane to walk.  The pain does affect his functioning on a daily basis.  Patient reports he has tried epidural injection, steroid medications, opioid medications and so on.  He reports the opioid medications as beneficial to some extent.  He currently uses opioid medications.  He denies misusing it or diverting it.  He has been very compliant with therapy.  He reports his pain provider is unable to continue to treat him due to his health insurance plan change and hence he had to change providers.  Patient denies any history of depression.  Patient denies any history of anxiety.  Patient does report some sleep issues on and off due to his pain.  He uses CPAP and is able to sleep most of the night.  Patient denies any history of trauma.  Patient denies any history of mania or hypomanic episodes.  Patient does report having a problem with alcohol in the 1990s.  He quit drinking in 1991.  He does report a history of at least 3 DWIs.  The last one was in 1990.  He currently denies problem with alcohol at this time.    Associated Signs/Symptoms: Depression Symptoms:  Denies (Hypo) Manic Symptoms:  Denies Anxiety Symptoms:  Denies Psychotic Symptoms:  Denies PTSD Symptoms: Negative  Past Psychiatric History: Patient denies past history of psychiatric  diagnosis or treatment.  He denies inpatient mental health admissions.  He denies suicide attempts.  Previous Psychotropic Medications: No   Substance Abuse History in the last 12 months:  No.  Consequences of Substance Abuse: Negative  Past Medical History:  Past Medical History:  Diagnosis Date  . Ascending aortic  aneurysm and dissection    2006 repaired  7 cm aneurysm  . Chest pain, atypical   . Coronary artery reimplantation   . Hyperlipidemia    Mixed  . Hypertension    Unspecified  . Nuclear stress test    Myoview 08/2018:  EF 58, no scar or ischemia; Low Risk  . Obesity   . Psoriasis   . S/P aortic valve and root replacement    HX of St. Jude  . Seizures ([place])    one time incident 2018  . Sleep apnea, obstructive     Past Surgical History:  Procedure Laterality Date  . Ascending aortic dissection aneurysm  12/06/2004   ok for MRI 1.5 or 3T Using a 27-mm St. Jude mechanical valve conduit with reimplantation of both coronary arteries  . CORNEAL TRANSPLANT     R   . KNEE SURGERY     R  . Replacement of aortic valve  12/06/2004    Family Psychiatric History: Father-alcohol abuse, brother-alcohol abuse.  Both of them are deceased.  Family History:  Family History  Problem Relation Age of Onset  . Cervical cancer Mother   . Coronary artery disease Mother   . Diabetes Mother   . Diabetes Father   . Cancer Father        lung cancer   . Obesity Father   . Alcohol abuse Father   . Cervical cancer Sister   . Cancer Brother        lung cancer  . Alcohol abuse Brother   . Kidney disease Other        dialysis    Social History:   Social History   Socioeconomic History  . Marital status: Married    Spouse name: Not on file  . Number of children: 0  . Years of education: 72  . Highest education level: Not on file  Occupational History  . Occupation: MIXER/PACKER    Employer: [employer]    Comment: works at [employer]- inhales vapors  Tobacco Use  . Smoking status: Former Smoker    Packs/day: 0.10    Years: 18.00    Pack years: 1.80    Types: Cigarettes    Quit date: 12/07/1989    Years since quitting: 29.8  . Smokeless tobacco: Never Used  Substance and Sexual Activity  . Alcohol use: No    Comment: Quit 1991  . Drug use: No  . Sexual activity: Not on  file  Other Topics Concern  . Not on file  Social History Narrative   Married   No regular exercise   Currently disabled      Patient drinks 2 cups of coffee a day    Patient is right handed.    Social Determinants of Health   Financial Resource Strain:   . Difficulty of Paying Living Expenses:   Food Insecurity:   . Worried About Charity fundraiser in the Last Year:   . Arboriculturist in the Last Year:   Transportation Needs:   . Film/video editor (Medical):   Marland Kitchen Lack of Transportation (Non-Medical):   Physical Activity:   . Days  of Exercise per Week:   . Minutes of Exercise per Session:   Stress:   . Feeling of Stress :   Social Connections:   . Frequency of Communication with Friends and Family:   . Frequency of Social Gatherings with Friends and Family:   . Attends Religious Services:   . Active Member of Clubs or Organizations:   . Attends Banker Meetings:   Marland Kitchen Marital Status:     Additional Social History: Patient was raised by mother.  He reports he had a good childhood.  He went up to 12th grade.  He is currently disabled.  He has been married since the past 32 years.  He raised his wife's biological children however both of them passed away due to medical causes few years ago.  Patient currently lives in [place] with his wife.  Patient denies any history of trauma.  Patient does report a past history of at least 3 DWIs as summarized above.  Allergies:   Allergies  Allergen Reactions  . Nsaids Other (See Comments)    Told not to take meds-per patient    Metabolic Disorder Labs: Lab Results  Component Value Date   HGBA1C 5.9 (H) 08/04/2019   No results found for: PROLACTIN Lab Results  Component Value Date   CHOL 153 08/04/2019   TRIG 105 08/04/2019   HDL 48 08/04/2019   CHOLHDL 3.2 08/04/2019   LDLCALC 86 08/04/2019   No results found for: TSH  Therapeutic Level Labs: No results found for: LITHIUM No results found for:  CBMZ No results found for: VALPROATE  Current Medications: Current Outpatient Medications  Medication Sig Dispense Refill  . acetaminophen (TYLENOL) 325 MG tablet Take 2 tablets (650 mg total) by mouth every 6 (six) hours as needed. 30 tablet 0  . aspirin 81 MG tablet Take 81 mg by mouth daily.    Marland Kitchen atorvastatin (LIPITOR) 20 MG tablet Take 1 tablet (20 mg total) by mouth daily. 90 tablet 3  . Cholecalciferol (VITAMIN D) 50 MCG (2000 UT) tablet Take 1 tablet (2,000 Units total) by mouth 2 (two) times daily. 180 tablet 0  . Ferrous Sulfate (IRON) 325 (65 Fe) MG TABS Take 1 tablet by mouth once daily 90 tablet 0  . furosemide (LASIX) 40 MG tablet Take 1.5 tablets (60 mg total) by mouth 2 (two) times daily. 90 tablet 1  . metoprolol succinate (TOPROL-XL) 50 MG 24 hr tablet Take 1 tablet (50 mg total) by mouth daily. 90 tablet 3  . Multiple Vitamins-Minerals (CENTRUM SILVER ADULT 50+) TABS 1 qd    . oxyCODONE-acetaminophen (PERCOCET) 10-325 MG tablet Take 1 tablet by mouth every 6 (six) hours as needed for pain. 120 tablet 0  . potassium chloride (MICRO-K) 10 MEQ CR capsule Take 1 capsule (10 mEq total) by mouth daily. 90 capsule 1  . pregabalin (LYRICA) 75 MG capsule Take 1 capsule (75 mg total) by mouth 2 (two) times daily. 60 capsule 0  . Secukinumab, 300 MG Dose, (COSENTYX, 300 MG DOSE,) 150 MG/ML SOSY Inject 300 mg into the skin every 28 (twenty-eight) days.    . vitamin C (ASCORBIC ACID) 500 MG tablet Take 1,000 mg by mouth daily.     Marland Kitchen warfarin (COUMADIN) 5 MG tablet TAKE AS DIRECTED BY COUMADIN CLINIC 150 tablet 1   No current facility-administered medications for this visit.    Musculoskeletal: Strength & Muscle Tone: UTA Gait & Station: Walks with cane Patient leans: N/A  Psychiatric Specialty  Exam: Review of Systems  Musculoskeletal: Positive for back pain.       BL Knee pain  Psychiatric/Behavioral: Negative for agitation, behavioral problems, confusion, decreased  concentration, dysphoric mood, hallucinations, self-injury, sleep disturbance and suicidal ideas. The patient is not nervous/anxious and is not hyperactive.     There were no vitals taken for this visit.There is no height or weight on file to calculate BMI.  General Appearance: Casual  Eye Contact:  Fair  Speech:  Clear and Coherent  Volume:  Normal  Mood:  Euthymic  Affect:  Congruent  Thought Process:  Goal Directed and Descriptions of Associations: Intact  Orientation:  Full (Time, Place, and Person)  Thought Content:  Logical  Suicidal Thoughts:  No  Homicidal Thoughts:  No  Memory:  Immediate;   Fair Recent;   Fair Remote;   Fair  Judgement:  Fair  Insight:  Fair  Psychomotor Activity:  Normal  Concentration:  Concentration: Fair and Attention Span: Fair  Recall:  [employer] of Knowledge:Fair  Language: Fair  Akathisia:  No  Handed:  Right  AIMS (if indicated): UTA  Assets:  Communication Skills Desire for Improvement Housing Social Support  ADL's:  Intact  Cognition: WNL  Sleep:  overall OK   Screenings: PHQ2-9     Office Visit from 09/14/2019 in Primary Care at [facility] Visit from 09/05/2019 in [facility] Office Visit from 08/19/2019 in Primary Care at [facility] Visit from 08/04/2019 in Primary Care at [facility] Visit from 01/05/2019 in [facility] [place]  PHQ-2 Total Score  0  0  0  0  0  PHQ-9 Total Score  -  -  -  -  0      Assessment and Plan: Mr. Janziel Hockett is a 59 year old African-American male, has a history of chronic back pain, bilateral knee pain, multiple medical problems, currently disabled, was evaluated by telemedicine today.  Patient was referred to the clinic for routine assessment of possible mental health/substance abuse risk potential prior to initiation of pain management by his pain provider.  Instruments used Clinical interview Screener and Opioid assessment for  patients with pain/revised Opioid risk tool Drug abuse screening test Alcohol use disorder identification test PHQ-9 GAD 7   Based on clinical interview and instruments used at the time of evaluation the risk is determined to be low to moderate.  I have reviewed urine compliance drug analysis dated 09/06/2019.  I have reviewed [place] controlled substance database.  I have reviewed Dr. Garnett Farm progress notes from 09/05/2019.  I have spent atleast 60 minutes non face to face with patient today. More than 50 % of the time was spent for preparing to see the patient ( e.g., review of test, records ), obtaining and to review and separately obtained history ,as well as documenting clinical information in electronic health record,interpreting results of test and communication of results. This note was generated in part or whole with voice recognition software. Voice recognition is usually quite accurate but there are transcription errors that can and very often do occur. I apologize for any typographical errors that were not detected and corrected.         Jomarie Longs, MD 4/28/202110:12 AM

## 2019-10-12 ENCOUNTER — Other Ambulatory Visit: Payer: Self-pay | Admitting: Family Medicine

## 2019-10-12 DIAGNOSIS — G589 Mononeuropathy, unspecified: Secondary | ICD-10-CM

## 2019-10-12 NOTE — Telephone Encounter (Signed)
Patient is requesting a refill of the following medications: Requested Prescriptions   Pending Prescriptions Disp Refills  . oxyCODONE-acetaminophen (PERCOCET) 10-325 MG tablet 120 tablet 0    Sig: Take 1 tablet by mouth every 6 (six) hours as needed for pain.  . pregabalin (LYRICA) 75 MG capsule 60 capsule 0    Sig: Take 1 capsule (75 mg total) by mouth 2 (two) times daily.    Date of patient request: 10/12/2019 Last office visit: 09/14/2019 Date of last refill: 09/14/2019 oxy 08/19/2019 lyrica Last refill amount:  Follow up time period per chart: 12/15/2019

## 2019-10-12 NOTE — Telephone Encounter (Signed)
What is the name of the medication? oxyCODONE-acetaminophen (PERCOCET) 10-325 MG tablet [[id number] and pregabalin (LYRICA) 75 MG capsule [[id number]] ENDED   Have you contacted your pharmacy to request a refill? Yes he needs to contact his doctor.   Which pharmacy would you like this sent to? Pharmacy  [id number] - [place]), [place] - [address]  [address] [place] ([place]) [place] [address]  Phone:  [phone] Fax:  [phone]       Patient notified that their request is being sent to the clinical staff for review and that they should receive a call once it is complete. If they do not receive a call within 72 hours they can check with their pharmacy or our office.

## 2019-10-13 ENCOUNTER — Telehealth: Payer: Self-pay

## 2019-10-13 ENCOUNTER — Telehealth: Payer: Self-pay | Admitting: *Deleted

## 2019-10-13 MED ORDER — OXYCODONE-ACETAMINOPHEN 10-325 MG PO TABS: 1.0000 | ORAL_TABLET | Freq: Four times a day (QID) | ORAL | 0 refills | Status: DC | PRN

## 2019-10-13 MED ORDER — PREGABALIN 75 MG PO CAPS: 75.0000 mg | ORAL_CAPSULE | Freq: Two times a day (BID) | ORAL | 0 refills | Status: DC

## 2019-10-13 NOTE — Telephone Encounter (Signed)
Lyrica PA was faxed over successfully. Waiting on approval.

## 2019-10-13 NOTE — Telephone Encounter (Signed)
pmp reviewd, appropriate meds refilled 

## 2019-10-13 NOTE — Telephone Encounter (Signed)
[name] from [facility] [employer] called to request encounter notes and alternative drug suggestions for this pt. Pt uses lyrica for peripheral neuropathy

## 2019-10-17 ENCOUNTER — Telehealth: Payer: Self-pay

## 2019-10-17 ENCOUNTER — Encounter: Payer: Self-pay | Admitting: Family Medicine

## 2019-10-17 DIAGNOSIS — M5416 Radiculopathy, lumbar region: Secondary | ICD-10-CM | POA: Insufficient documentation

## 2019-10-17 NOTE — Telephone Encounter (Signed)
Sent message to Raynelle Fanning to follow up on this case. Trying to get Lyrica for pt approved

## 2019-10-18 ENCOUNTER — Telehealth: Payer: Self-pay

## 2019-10-18 NOTE — Telephone Encounter (Signed)
Closing encounter, no action needed 

## 2019-10-18 NOTE — Telephone Encounter (Signed)
Had to resend appeal waiting on communication of approval/denial

## 2019-10-19 ENCOUNTER — Telehealth: Payer: Self-pay | Admitting: Emergency Medicine

## 2019-10-19 NOTE — Telephone Encounter (Signed)
CRM=Name/Agency: [name] from [facility] #: [phone] option 3  Information Requested: chart notes  supporting diagnoses and medication use for pregablin  Office notes and CT scans has been faxed to [facility], Fax was successful.

## 2019-10-27 ENCOUNTER — Telehealth: Payer: Self-pay | Admitting: Family Medicine

## 2019-10-27 NOTE — Telephone Encounter (Signed)
Pt is calling about needing gabapentin 75 multiple requests he has made will he need a visit for this ? Wanted let her know that a still swollen

## 2019-10-27 NOTE — Telephone Encounter (Signed)
I see in patient has had gabapentin 100 mg and 400 mg but I do not see where we ever prescribed a 75 mg.  Please advise if patient need to be seen or if we can send in the 75 mg

## 2019-10-28 ENCOUNTER — Other Ambulatory Visit: Payer: Self-pay | Admitting: Family Medicine

## 2019-10-28 DIAGNOSIS — G589 Mononeuropathy, unspecified: Secondary | ICD-10-CM

## 2019-10-28 NOTE — Telephone Encounter (Signed)
[name] - were are we with PA requested. Did it get approved, denied? Can we please update the patient. Thanks

## 2019-10-31 ENCOUNTER — Telehealth: Payer: Self-pay | Admitting: *Deleted

## 2019-10-31 ENCOUNTER — Other Ambulatory Visit: Payer: Self-pay | Admitting: *Deleted

## 2019-10-31 DIAGNOSIS — G589 Mononeuropathy, unspecified: Secondary | ICD-10-CM

## 2019-10-31 NOTE — Telephone Encounter (Signed)
Patient would like a refill on his oxycodone before it runs out, he has an appointment next month with pain management.

## 2019-11-01 MED ORDER — OXYCODONE-ACETAMINOPHEN 10-325 MG PO TABS: 1.0000 | ORAL_TABLET | Freq: Four times a day (QID) | ORAL | 0 refills | Status: DC | PRN

## 2019-11-01 NOTE — Telephone Encounter (Signed)
pmp reviewed Med refilled, dated for 11/11/2019

## 2019-11-01 NOTE — Addendum Note (Signed)
Addended by: Myles Lipps on: 11/01/2019 11:17 AM   Modules accepted: Orders

## 2019-11-01 NOTE — Telephone Encounter (Signed)
Spoke with patient he will use [facility] to fill medication .

## 2019-11-08 ENCOUNTER — Other Ambulatory Visit: Payer: Self-pay

## 2019-11-08 ENCOUNTER — Ambulatory Visit: Payer: Self-pay | Admitting: Family Medicine

## 2019-11-08 ENCOUNTER — Ambulatory Visit
Payer: 59 | Attending: Student in an Organized Health Care Education/Training Program | Admitting: Student in an Organized Health Care Education/Training Program

## 2019-11-08 ENCOUNTER — Encounter: Payer: Self-pay | Admitting: Student in an Organized Health Care Education/Training Program

## 2019-11-08 VITALS — BP 129/63 | HR 74 | Temp 97.3°F | Resp 18 | Ht 74.0 in | Wt 330.0 lb

## 2019-11-08 DIAGNOSIS — M47816 Spondylosis without myelopathy or radiculopathy, lumbar region: Secondary | ICD-10-CM | POA: Insufficient documentation

## 2019-11-08 DIAGNOSIS — M5136 Other intervertebral disc degeneration, lumbar region: Secondary | ICD-10-CM | POA: Insufficient documentation

## 2019-11-08 DIAGNOSIS — G894 Chronic pain syndrome: Secondary | ICD-10-CM | POA: Insufficient documentation

## 2019-11-08 DIAGNOSIS — M5416 Radiculopathy, lumbar region: Secondary | ICD-10-CM | POA: Insufficient documentation

## 2019-11-08 MED ORDER — OXYCODONE-ACETAMINOPHEN 10-325 MG PO TABS: 1.0000 | ORAL_TABLET | Freq: Two times a day (BID) | ORAL | 0 refills | Status: DC | PRN

## 2019-11-08 MED ORDER — BUPRENORPHINE 10 MCG/HR TD PTWK: 1.0000 | MEDICATED_PATCH | TRANSDERMAL | 1 refills | Status: AC

## 2019-11-08 NOTE — Patient Instructions (Signed)
Sign contract

## 2019-11-08 NOTE — Progress Notes (Signed)
Safety precautions to be maintained throughout the outpatient stay will include: orient to surroundings, keep bed in low position, maintain call bell within reach at all times, provide assistance with transfer out of bed and ambulation.  
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
## 2019-11-08 NOTE — Telephone Encounter (Signed)
Pt is complaining of swelling, no redness heat or pitting. Pt has been taking his Lasix but it has been uncontrolled. Last OV 09/14/2019, should I advise a new OV? Or a medication change? Will call him back

## 2019-11-08 NOTE — Telephone Encounter (Signed)
  Pt reports edema of both legs, from knees through feet. Onset 2 weeks ago.Denies pain, no redness or warmth. States non-pitting. Denies any SOB, on lasix, no missed doses. States has appt 11/15/2019, saw pain management MD this am who suggested he call PCP.  BP at visit 129/66.  Has not been elevating legs, "Been on them a lot." H/O [facility].  Reports"Tightness when first standing." NT called practice for appt,  Availability Thursday. Pt advised ED if symptoms worsen, any SOB occurs. Verbalizes understanding.  Reason for Disposition . [1] MODERATE leg swelling (e.g., swelling extends up to knees) AND [2] new onset or worsening  Answer Assessment - Initial Assessment Questions 1. ONSET: "When did the swelling start?" (e.g., minutes, hours, days)     2 weeks 2. LOCATION: "What part of the leg is swollen?"  "Are both legs swollen or just one leg?"     Both  Knee through feet 3. SEVERITY: "How bad is the swelling?" (e.g., localized; mild, moderate, severe)  - Localized - small area of swelling localized to one leg  - MILD pedal edema - swelling limited to foot and ankle, pitting edema < 1/4 inch (6 mm) deep, rest and elevation eliminate most or all swelling  - MODERATE edema - swelling of lower leg to knee, pitting edema > 1/4 inch (6 mm) deep, rest and elevation only partially reduce swelling  - SEVERE edema - swelling extends above knee, facial or hand swelling present      moderate 4. REDNESS: "Does the swelling look red or infected?"     "Little around ankles" 5. PAIN: "Is the swelling painful to touch?" If so, ask: "How painful is it?"   (Scale 1-10; mild, moderate or severe)     Intermittent when first stand up. Tightness 6. FEVER: "Do you have a fever?" If so, ask: "What is it, how was it measured, and when did it start?"      no 7. CAUSE: "What do you think is causing the leg swelling?"     Unsure 8. MEDICAL HISTORY: "Do you have a history of heart failure, kidney disease, liver failure, or  cancer?"    Yes DCHF 9. RECURRENT SYMPTOM: "Have you had leg swelling before?" If so, ask: "When was the last time?" "What happened that time?"     no 10. OTHER SYMPTOMS: "Do you have any other symptoms?" (e.g., chest pain, difficulty breathing)       no  Protocols used: LEG SWELLING AND EDEMA-A-AH

## 2019-11-09 ENCOUNTER — Ambulatory Visit (INDEPENDENT_AMBULATORY_CARE_PROVIDER_SITE_OTHER): Payer: 59 | Admitting: *Deleted

## 2019-11-09 DIAGNOSIS — I359 Nonrheumatic aortic valve disorder, unspecified: Secondary | ICD-10-CM | POA: Diagnosis not present

## 2019-11-09 DIAGNOSIS — Z7901 Long term (current) use of anticoagulants: Secondary | ICD-10-CM

## 2019-11-09 DIAGNOSIS — Z5181 Encounter for therapeutic drug level monitoring: Secondary | ICD-10-CM

## 2019-11-09 DIAGNOSIS — Z952 Presence of prosthetic heart valve: Secondary | ICD-10-CM

## 2019-11-09 LAB — POCT INR: INR: 3.6 — AB (ref 2.0–3.0)

## 2019-11-09 NOTE — Telephone Encounter (Signed)
Please let patient know to take an extra lasix in the AM (2 tabs) and 1 tab in afternoon for 3 days. He is to also take an extra potassium for 3 days. He is to elevate his legs. Thanks

## 2019-11-09 NOTE — Patient Instructions (Signed)
Description   Today take 1 tablet then continue on same dosage 1.5 tablets daily except 2 tablets on Fridays.  Recheck INR in 6 weeks. Call us when your procedure date is set [phone] & Fax number is [phone]

## 2019-11-10 NOTE — Telephone Encounter (Signed)
Pt voiced understanding and stated he would see Dr Leretha Pol Tuesday

## 2019-11-15 ENCOUNTER — Ambulatory Visit (INDEPENDENT_AMBULATORY_CARE_PROVIDER_SITE_OTHER): Payer: 59 | Admitting: Family Medicine

## 2019-11-15 ENCOUNTER — Encounter: Payer: Self-pay | Admitting: Family Medicine

## 2019-11-15 ENCOUNTER — Other Ambulatory Visit: Payer: Self-pay

## 2019-11-15 VITALS — BP 132/69 | HR 69 | Temp 98.0°F | Ht 74.0 in | Wt 319.0 lb

## 2019-11-15 DIAGNOSIS — I110 Hypertensive heart disease with heart failure: Secondary | ICD-10-CM | POA: Diagnosis not present

## 2019-11-15 DIAGNOSIS — G589 Mononeuropathy, unspecified: Secondary | ICD-10-CM | POA: Diagnosis not present

## 2019-11-15 DIAGNOSIS — I8311 Varicose veins of right lower extremity with inflammation: Secondary | ICD-10-CM | POA: Diagnosis not present

## 2019-11-15 DIAGNOSIS — E870 Hyperosmolality and hypernatremia: Secondary | ICD-10-CM

## 2019-11-15 MED ORDER — PREGABALIN 75 MG PO CAPS: 75.0000 mg | ORAL_CAPSULE | Freq: Two times a day (BID) | ORAL | 5 refills | Status: DC

## 2019-11-15 NOTE — Progress Notes (Signed)
6/8/20214:24 PM  [name] 12/25/1960, 59 y.o., male [id number]  Chief Complaint  Patient presents with  . Pain    having pain and swelling in the legs, still not approved for the lyrica. Was given a pain patch to use, has not put it on yet, received from pain management doctor    HPI:   Patient is a 59 y.o. male with past medical history significant for HTN, prediabetes, HLP, mechanical AV replacement on coumadin, dHF, AAA repair, OSA on cpap, GERD, chronic pain (DDD lumbar spine with radiculopathy and bilateral knee OA and psoriasiswho presents today for followup  Last OV April 2021 Patient's insurance not covering lyrica Saw pain mgt November 08 2019 - transitioned to buprenorphine  He is overall doing well  He is ok with paying lyrica thru [facility] as PA denied Patient reports edema is better Patient reports that he has had several varicose veins bleed Has had to call EMS several times to help control bleeding He does use compression stockings  Uses a cane  Depression screen [facility] 2/9 09/14/2019 09/05/2019 08/19/2019  Decreased Interest 0 0 0  Down, Depressed, Hopeless 0 0 0  PHQ - 2 Score 0 0 0  Altered sleeping - - -  Tired, decreased energy - - -  Change in appetite - - -  Feeling bad or failure about yourself  - - -  Trouble concentrating - - -  Moving slowly or fidgety/restless - - -  Suicidal thoughts - - -  PHQ-9 Score - - -  Difficult doing work/chores - - -  Some recent data might be hidden    Fall Risk  11/08/2019 09/14/2019 09/05/2019 08/19/2019 08/04/2019  Falls in the past year? 0 0 0 0 0  Number falls in past yr: 0 0 - 0 0  Injury with Fall? 0 0 - 0 0  Follow up Falls evaluation completed Falls evaluation completed - Falls evaluation completed Falls evaluation completed     Allergies  Allergen Reactions  . Nsaids Other (See Comments)    Told not to take meds-per patient    Prior to Admission medications   Medication Sig Start Date End Date  Taking? Authorizing Provider  acetaminophen (TYLENOL) 325 MG tablet Take 2 tablets (650 mg total) by mouth every 6 (six) hours as needed. 05/20/18   Khatri, Hina, PA-C  aspirin 81 MG tablet Take 81 mg by mouth daily.    [provider]  atorvastatin (LIPITOR) 20 MG tablet Take 1 tablet (20 mg total) by mouth daily. 11/16/18 11/08/19  Leone Brand, NP  buprenorphine (BUTRANS) 10 MCG/HR PTWK Place 1 patch onto the skin every 7 (seven) days. 11/08/19 12/08/19  Edward Jolly, MD  Cholecalciferol (VITAMIN D) 50 MCG (2000 UT) tablet Take 1 tablet (2,000 Units total) by mouth 2 (two) times daily. 11/16/18   Leone Brand, NP  Ferrous Sulfate (IRON) 325 (65 Fe) MG TABS Take 1 tablet by mouth once daily 02/16/19   Leone Brand, NP  furosemide (LASIX) 40 MG tablet Take 1.5 tablets (60 mg total) by mouth 2 (two) times daily. 09/14/19   Myles Lipps, MD  metoprolol succinate (TOPROL-XL) 50 MG 24 hr tablet Take 1 tablet (50 mg total) by mouth daily. 11/16/18   Leone Brand, NP  Multiple Vitamins-Minerals (CENTRUM SILVER ADULT 50+) TABS 1 qd    [provider]  oxyCODONE-acetaminophen (PERCOCET) 10-325 MG tablet Take 1 tablet by mouth 2 (two) times daily as  needed for pain. Must last 30 days. 11/11/19 12/11/19  Gillis Santa, MD  oxyCODONE-acetaminophen (PERCOCET) 10-325 MG tablet Take 1 tablet by mouth 2 (two) times daily as needed for pain. Must last 30 days. 12/11/19 01/10/20  Gillis Santa, MD  potassium chloride (MICRO-K) 10 MEQ CR capsule Take 1 capsule (10 mEq total) by mouth daily. 09/14/19 09/08/20  Rutherford Guys, MD  pregabalin (LYRICA) 75 MG capsule Take 1 capsule (75 mg total) by mouth 2 (two) times daily. 10/13/19 11/12/19  Rutherford Guys, MD  Secukinumab, 300 MG Dose, (COSENTYX, 300 MG DOSE,) 150 MG/ML SOSY Inject 300 mg into the skin every 28 (twenty-eight) days.    Specialists, Dermatology  vitamin C (ASCORBIC ACID) 500 MG tablet Take 1,000 mg by mouth daily.     [provider]    warfarin (COUMADIN) 5 MG tablet TAKE AS DIRECTED BY COUMADIN CLINIC Patient taking differently: 7.5 mg. TAKE AS DIRECTED BY COUMADIN CLINIC 7.5 every day except fridays.  Fridays is 10 mg 09/29/19   Sherren Mocha, MD    Past Medical History:  Diagnosis Date  . Ascending aortic aneurysm and dissection    2006 repaired  7 cm aneurysm  . Chest pain, atypical   . Coronary artery reimplantation   . Hyperlipidemia    Mixed  . Hypertension    Unspecified  . Nuclear stress test    Myoview 08/2018:  EF 58, no scar or ischemia; Low Risk  . Obesity   . Psoriasis   . S/P aortic valve and root replacement    HX of [employer] Jude  . Seizures ([place])    one time incident 2018  . Sleep apnea, obstructive     Past Surgical History:  Procedure Laterality Date  . Ascending aortic dissection aneurysm  12/06/2004   ok for MRI 1.5 or 3T Using a 27-mm [employer] Jude mechanical valve conduit with reimplantation of both coronary arteries  . CORNEAL TRANSPLANT     R   . KNEE SURGERY     R  . Replacement of aortic valve  12/06/2004    Social History   Tobacco Use  . Smoking status: Former Smoker    Packs/day: 0.10    Years: 18.00    Pack years: 1.80    Types: Cigarettes    Quit date: 12/07/1989    Years since quitting: 29.9  . Smokeless tobacco: Never Used  Substance Use Topics  . Alcohol use: No    Comment: Quit 1991    Family History  Problem Relation Age of Onset  . Cervical cancer Mother   . Coronary artery disease Mother   . Diabetes Mother   . Diabetes Father   . Cancer Father        lung cancer   . Obesity Father   . Alcohol abuse Father   . Cervical cancer Sister   . Cancer Brother        lung cancer  . Alcohol abuse Brother   . Kidney disease Other        dialysis    Review of Systems  Constitutional: Negative for chills and fever.  Respiratory: Negative for cough and shortness of breath.   Cardiovascular: Positive for leg swelling. Negative for chest pain and palpitations.   Gastrointestinal: Negative for abdominal pain, nausea and vomiting.    OBJECTIVE:  Today's Vitals   11/15/19 1612  BP: 132/69  Pulse: 69  Temp: 98 F (36.7 C)  SpO2: 98%  Weight: (!) 319 lb (  144.7 kg)  Height: 6\' 2"  (1.88 m)   Body mass index is 40.96 kg/m.   Wt Readings from Last 3 Encounters:  11/15/19 (!) 319 lb (144.7 kg)  11/08/19 (!) 330 lb (149.7 kg)  09/28/19 (!) 330 lb (149.7 kg)     Physical Exam Vitals and nursing note reviewed.  Constitutional:      Appearance: He is well-developed.  HENT:     Head: Normocephalic and atraumatic.  Eyes:     Conjunctiva/sclera: Conjunctivae normal.     Pupils: Pupils are equal, round, and reactive to light.  Cardiovascular:     Rate and Rhythm: Normal rate and regular rhythm.     Heart sounds: No murmur. No friction rub. No gallop.   Pulmonary:     Effort: Pulmonary effort is normal.     Breath sounds: Normal breath sounds. No wheezing or rales.  Musculoskeletal:     Cervical back: Neck supple.     Right lower leg: Edema (trace calf, +1 ankle, chronic hyperpigmentation, multiple scabs) present.     Left lower leg: Edema present.  Skin:    General: Skin is warm and dry.  Neurological:     Mental Status: He is alert and oriented to person, place, and time.     No results found for this or any previous visit (from the past 24 hour(s)).  No results found.   ASSESSMENT and PLAN  1. Benign hypertensive heart disease with heart failure (HCC) Controlled. Continue current regime.  - Basic Metabolic Panel  2. Mononeuropathy Patient will be paying for lyrica as not covered by insurance, PA denied - pregabalin (LYRICA) 75 MG capsule; Take 1 capsule (75 mg total) by mouth 2 (two) times daily.  3. Varicose veins of right lower extremity with inflammation - Ambulatory referral to Vascular Surgery  Discussed decreased risk of accidental OD/respiratory failure with buprenorphine.   Return for end of aug.    09/30/19, MD [facility] at [facility] [address] [place], [place] Ph.  [phone] Fax [phone]

## 2019-11-15 NOTE — Patient Instructions (Signed)
° ° ° °  If you have lab work done today you will be contacted with your lab results within the next 2 weeks.  If you have not heard from us then please contact us. The fastest way to get your results is to register for My Chart. ° ° °IF you received an x-ray today, you will receive an invoice from [facility]. Please contact [facility] at [phone] with questions or concerns regarding your invoice.  ° °IF you received labwork today, you will receive an invoice from [facility]. Please contact [facility] at [phone] with questions or concerns regarding your invoice.  ° °Our billing staff will not be able to assist you with questions regarding bills from these companies. ° °You will be contacted with the lab results as soon as they are available. The fastest way to get your results is to activate your My Chart account. Instructions are located on the last page of this paperwork. If you have not heard from us regarding the results in 2 weeks, please contact this office. °  ° ° ° °

## 2019-11-16 LAB — BASIC METABOLIC PANEL
BUN/Creatinine Ratio: 16 (ref 10–24)
BUN: 14 mg/dL (ref 8–27)
CO2: 27 mmol/L (ref 20–29)
Calcium: 8.9 mg/dL (ref 8.6–10.2)
Chloride: 109 mmol/L — ABNORMAL HIGH (ref 96–106)
Creatinine, Ser: 0.86 mg/dL (ref 0.76–1.27)
GFR calc Af Amer: 108 mL/min/{1.73_m2} (ref >59)
GFR calc non Af Amer: 94 mL/min/{1.73_m2} (ref >59)
Glucose: 132 mg/dL — ABNORMAL HIGH (ref 65–99)
Potassium: 3.8 mmol/L (ref 3.5–5.2)
Sodium: 150 mmol/L — ABNORMAL HIGH (ref 134–144)

## 2019-11-17 NOTE — Addendum Note (Signed)
Addended by: Myles Lipps on: 11/17/2019 02:03 PM   Modules accepted: Orders

## 2019-11-18 ENCOUNTER — Telehealth: Payer: Self-pay

## 2019-11-18 NOTE — Telephone Encounter (Signed)
Received message from patient on Friday.  Message was unclear.  States something about not getting his oxycodone.  Attempted to call patient back.  Left message on answering machine to call us back.

## 2019-11-21 ENCOUNTER — Encounter (HOSPITAL_COMMUNITY): Payer: 59

## 2019-11-21 ENCOUNTER — Telehealth: Payer: Self-pay | Admitting: *Deleted

## 2019-11-21 NOTE — Telephone Encounter (Signed)
Ok for [employer], no issues there. Please let me know if I need to do anything

## 2019-11-21 NOTE — Telephone Encounter (Signed)
Patient also has told me that pharmacy states they do not have an Rx for his oxycodone- apap 10-325 that was sent in on June 1 to fill on June 4.   Called to [facility], spoke with pharmacist to verify Rx's.  She states that they had a drug interaction alert and wanted to check combination.  Same verified, no PA needed for oxycodone- apap.   I will call patient to let him know.

## 2019-11-21 NOTE — Telephone Encounter (Signed)
Patient went to pharmacy and they told patient that he should not use patches and take oxycodone at the same time as this would make him overdose.  Also requesting Narcan be sent in.  He is calling to make sure this is okay.

## 2019-12-15 ENCOUNTER — Other Ambulatory Visit: Payer: Self-pay | Admitting: *Deleted

## 2019-12-15 ENCOUNTER — Ambulatory Visit: Payer: 59 | Admitting: Family Medicine

## 2019-12-15 DIAGNOSIS — I8311 Varicose veins of right lower extremity with inflammation: Secondary | ICD-10-CM

## 2019-12-21 ENCOUNTER — Encounter: Payer: Self-pay | Admitting: Student in an Organized Health Care Education/Training Program

## 2019-12-21 ENCOUNTER — Other Ambulatory Visit: Payer: Self-pay

## 2019-12-21 ENCOUNTER — Ambulatory Visit
Payer: 59 | Attending: Student in an Organized Health Care Education/Training Program | Admitting: Student in an Organized Health Care Education/Training Program

## 2019-12-21 ENCOUNTER — Ambulatory Visit (INDEPENDENT_AMBULATORY_CARE_PROVIDER_SITE_OTHER): Payer: 59 | Admitting: *Deleted

## 2019-12-21 VITALS — BP 113/70 | HR 69 | Temp 98.7°F | Resp 18 | Ht 74.0 in | Wt 320.0 lb

## 2019-12-21 DIAGNOSIS — M47816 Spondylosis without myelopathy or radiculopathy, lumbar region: Secondary | ICD-10-CM

## 2019-12-21 DIAGNOSIS — M1711 Unilateral primary osteoarthritis, right knee: Secondary | ICD-10-CM | POA: Diagnosis present

## 2019-12-21 DIAGNOSIS — Z7901 Long term (current) use of anticoagulants: Secondary | ICD-10-CM

## 2019-12-21 DIAGNOSIS — G894 Chronic pain syndrome: Secondary | ICD-10-CM | POA: Diagnosis not present

## 2019-12-21 DIAGNOSIS — M5136 Other intervertebral disc degeneration, lumbar region: Secondary | ICD-10-CM | POA: Diagnosis present

## 2019-12-21 DIAGNOSIS — Z952 Presence of prosthetic heart valve: Secondary | ICD-10-CM | POA: Diagnosis not present

## 2019-12-21 DIAGNOSIS — Z5181 Encounter for therapeutic drug level monitoring: Secondary | ICD-10-CM

## 2019-12-21 DIAGNOSIS — M5416 Radiculopathy, lumbar region: Secondary | ICD-10-CM

## 2019-12-21 DIAGNOSIS — M1712 Unilateral primary osteoarthritis, left knee: Secondary | ICD-10-CM | POA: Diagnosis present

## 2019-12-21 DIAGNOSIS — I359 Nonrheumatic aortic valve disorder, unspecified: Secondary | ICD-10-CM | POA: Diagnosis not present

## 2019-12-21 DIAGNOSIS — M1612 Unilateral primary osteoarthritis, left hip: Secondary | ICD-10-CM | POA: Diagnosis present

## 2019-12-21 LAB — POCT INR: INR: 3.2 — AB (ref 2.0–3.0)

## 2019-12-21 MED ORDER — WARFARIN SODIUM 5 MG PO TABS: ORAL_TABLET | ORAL | 0 refills | Status: DC

## 2019-12-21 MED ORDER — OXYCODONE-ACETAMINOPHEN 10-325 MG PO TABS: 1.0000 | ORAL_TABLET | Freq: Four times a day (QID) | ORAL | 0 refills | Status: DC | PRN

## 2019-12-21 NOTE — Progress Notes (Signed)
Contacted patient's pharmacy, [facility] [address] [place], in regards to the increase in quantity for Percocet, from 60 to 120 pills/bottle.

## 2019-12-21 NOTE — Progress Notes (Signed)
Nursing Pain Medication Assessment:  Safety precautions to be maintained throughout the outpatient stay will include: orient to surroundings, keep bed in low position, maintain call bell within reach at all times, provide assistance with transfer out of bed and ambulation.  Medication Inspection Compliance: [name] did not comply with our request to bring his pills to be counted. He was reminded that bringing the medication bottles, even when empty, is a requirement.  Medication: None brought in. Pill/Patch Count: None available to be counted. Bottle Appearance: No container available. Did not bring bottle(s) to appointment. Filled Date: N/A Last Medication intake:  Ran out of medicine more than 48 hours ago. Ran out on 12/18/19. (Percocet)   Pulled out last patch (Buprenorpine) yesterday, 12/20/19.

## 2019-12-21 NOTE — Progress Notes (Signed)
PROVIDER NOTE: Information contained herein reflects review and annotations entered in association with encounter. Interpretation of such information and data should be left to medically-trained personnel. Information provided to patient can be located elsewhere in the medical record under "Patient Instructions". Document created using STT-dictation technology, any transcriptional errors that may result from process are unintentional.    Patient: [name]  Service Category: E/M  Provider: Gillis Santa, MD  DOB: [birth year]  DOS: 12/21/2019  Specialty: Interventional Pain Management  MRN: [id number]  Setting: Ambulatory outpatient  PCP: Lance Guys, MD  Type: Established Patient    Referring Provider: Rutherford Guys, MD  Location: Office  Delivery: Face-to-face     HPI  Reason for encounter: [name], a 59 y.o. year old male, is here today for evaluation and management of his Lumbar spondylosis [M47.816]. [name]'s primary complain today is Medication Refill Last encounter: Practice (11/21/2019). My last encounter with him was on 11/08/2019. Pertinent problems: [name] has OBESITY; Obstructive sleep apnea; Aortic valve disorder; S/P aortic valve replacement; Long term current use of anticoagulant; Chronic pain syndrome; Pain in joint, pelvic region and thigh; Pain in right hip; and Lumbar radiculopathy, chronic on their pertinent problem list. Pain Assessment: Severity of Chronic pain is reported as a 7 /10. Location: Knee Left/Denies. However had pain in left hip as well.. Onset: More than a month ago. Quality: Constant. Timing: Constant. Modifying factor(s): 'Only thing that really helps is the pills and sitting". Vitals:  height is _0  (1.88 m) and weight is 320 lb (145.2 kg) (abnormal). His oral temperature is 98.7 F (37.1 C). His blood pressure is 113/70 and his pulse is 69. His respiration is 18 and oxygen saturation is 96%.   Patient presents today for  medication management.  At his last visit, his chronic Percocet dose of 10 mg 4 times daily as needed, quantity 120/month was reduced to 60 and he was started on Butrans patch at 10 mcg an hour.  He states that he does not find any benefit with the Butrans patch and in fact his pain is worse than it was before.  He is requesting to go back to his previous regimen of Percocet 10 mg 4 times daily as needed.  Risks and benefits of oxycodone were discussed especially in the context of obstructive sleep apnea and morbid obesity.  Patient has been on this medication for many years.  We will refill as below.  Pharmacotherapy Assessment   Analgesic:  Percocet 10 mg 4 times daily as needed, 120/month, MME equals 60  (Failed Butrans patch at 10 mcg an hour when attempting to wean his Percocet dose so returning back to his previous regimen of Percocet 10 4 times daily as needed)   Monitoring: [name] PMP: PDMP reviewed during this encounter.       Pharmacotherapy: No side-effects or adverse reactions reported. Compliance: No problems identified. Effectiveness: Clinically acceptable.  Lance Napoleon, RN  12/21/2019  2:33 PM  Sign when Signing Visit Nursing Pain Medication Assessment:  Safety precautions to be maintained throughout the outpatient stay will include: orient to surroundings, keep bed in low position, maintain call bell within reach at all times, provide assistance with transfer out of bed and ambulation.  Medication Inspection Compliance: [name] did not comply with our request to bring his pills to be counted. He was reminded that bringing the medication bottles, even when empty, is a requirement.  Medication: None brought in. Pill/Patch Count: None  available to be counted. Bottle Appearance: No container available. Did not bring bottle(s) to appointment. Filled Date: N/A Last Medication intake:  Ran out of medicine more than 48 hours ago. Ran out on 12/18/19. (Percocet)   Pulled out  last patch (Buprenorpine) yesterday, 12/20/19.        UDS:  Summary  Date Value Ref Range Status  09/06/2019 Note  Final    Comment:    ==================================================================== Compliance Drug Analysis, Ur ==================================================================== Test                             Result       Flag       Units Drug Present and Declared for Prescription Verification   Oxycodone                      591          EXPECTED   ng/mg creat   Oxymorphone                    1065         EXPECTED   ng/mg creat   Noroxycodone                   1752         EXPECTED   ng/mg creat   Noroxymorphone                 325          EXPECTED   ng/mg creat    Sources of oxycodone are scheduled prescription medications.    Oxymorphone, noroxycodone, and noroxymorphone are expected    metabolites of oxycodone. Oxymorphone is also available as a    scheduled prescription medication.   Pregabalin                     PRESENT      EXPECTED   Acetaminophen                  PRESENT      EXPECTED   Metoprolol                     PRESENT      EXPECTED Drug Absent but Declared for Prescription Verification   Salicylate                     Not Detected UNEXPECTED    Aspirin, as indicated in the declared medication list, is not always    detected even when used as directed. ==================================================================== Test                      Result    Flag   Units      Ref Range   Creatinine              129              mg/dL      >=20 ==================================================================== Declared Medications:  The flagging and interpretation on this report are based on the  following declared medications.  Unexpected results may arise from  inaccuracies in the declared medications.  **Note: The testing scope of this panel includes these medications:  Metoprolol (Toprol)  Oxycodone (Percocet)  Pregabalin (Lyrica)   **Note: The testing scope of this panel does not include small to  moderate amounts of these  reported medications:  Acetaminophen (Tylenol)  Acetaminophen (Percocet)  Aspirin  **Note: The testing scope of this panel does not include the  following reported medications:  Atorvastatin (Lipitor)  Furosemide (Lasix)  Iron  Multivitamin (Centrum)  Potassium (Klor-Con)  Secukinumab (Cosentyx)  Vitamin C  Vitamin D  Vitamin D3  Warfarin (Coumadin) ==================================================================== For clinical consultation, please call [phone]. ====================================================================      ROS  Constitutional: Denies any fever or chills Gastrointestinal: No reported hemesis, hematochezia, vomiting, or acute GI distress Musculoskeletal: Denies any acute onset joint swelling, redness, loss of ROM, or weakness Neurological: No reported episodes of acute onset apraxia, aphasia, dysarthria, agnosia, amnesia, paralysis, loss of coordination, or loss of consciousness  Medication Review  Centrum Silver Adult 50+, Iron, Secukinumab (300 MG Dose), Vitamin D, acetaminophen, aspirin, atorvastatin, furosemide, metoprolol succinate, oxyCODONE-acetaminophen, potassium chloride, pregabalin, vitamin C, and warfarin  History Review  Allergy: Mr. Copen is allergic to nsaids. Drug: Mr. Andujo  reports no history of drug use. Alcohol:  reports no history of alcohol use. Tobacco:  reports that he quit smoking about 30 years ago. His smoking use included cigarettes. He has a 1.80 pack-year smoking history. He has never used smokeless tobacco. Social: Mr. Mitch  reports that he quit smoking about 30 years ago. His smoking use included cigarettes. He has a 1.80 pack-year smoking history. He has never used smokeless tobacco. He reports that he does not drink alcohol and does not use drugs. Medical:  has a past medical history of Ascending aortic aneurysm  and dissection, Chest pain, atypical, Coronary artery reimplantation, Hyperlipidemia, Hypertension, Nuclear stress test, Obesity, Psoriasis, S/P aortic valve and root replacement, Seizures ([place]), and Sleep apnea, obstructive. Surgical: Mr. Reuter  has a past surgical history that includes Replacement of aortic valve (12/06/2004); Ascending aortic dissection aneurysm (12/06/2004); Knee surgery; and Corneal transplant. Family: family history includes Alcohol abuse in his brother and father; Cancer in his brother and father; Cervical cancer in his mother and sister; Coronary artery disease in his mother; Diabetes in his father and mother; Kidney disease in an other family member; Obesity in his father.  Laboratory Chemistry Profile   Renal Lab Results  Component Value Date   BUN 14 11/15/2019   CREATININE 0.86 11/15/2019   BCR 16 11/15/2019   GFRAA 108 11/15/2019   GFRNONAA 94 11/15/2019     Hepatic Lab Results  Component Value Date   AST 13 08/04/2019   ALT 26 08/04/2019   ALBUMIN 3.8 08/04/2019   ALKPHOS 92 08/04/2019   LIPASE 21 07/26/2016     Electrolytes Lab Results  Component Value Date   NA 150 (H) 11/15/2019   K 3.8 11/15/2019   CL 109 (H) 11/15/2019   CALCIUM 8.9 11/15/2019   MG 1.9 03/18/2015     Bone No results found for: VD25OH, VD125OH2TOT, ZH2992EQ6, ST4196QI2, 25OHVITD1, 25OHVITD2, 25OHVITD3, TESTOFREE, TESTOSTERONE   Inflammation (CRP: Acute Phase) (ESR: Chronic Phase) No results found for: CRP, ESRSEDRATE, LATICACIDVEN     Note: Above Lab results reviewed.  Recent Imaging Review  LONG TERM MONITOR (3-14 DAYS) The basic rhythm is normal sinus with an average heart rate of 67 bpm. There are isolated PVCs present. There is an approximate 30-minute run of what appears to be atrial  tachycardia with abrupt onset and offset with heart rate approximately 100  bpm with some variability There is no sustained ventricular arrhythmia, pathologic pauses, or marked   bradycardia arrhythmias Note: Reviewed        Physical  Exam  General appearance: Well nourished, well developed, and well hydrated. In no apparent acute distress Mental status: Alert, oriented x 3 (person, place, & time)       Respiratory: No evidence of acute respiratory distress Eyes: PERLA Vitals: BP 113/70    Pulse 69    Temp 98.7 F (37.1 C) (Oral)    Resp 18    Ht _0  (1.88 m)    Wt (!) 320 lb (145.2 kg)    SpO2 96%    BMI 41.09 kg/m  BMI: Estimated body mass index is 41.09 kg/m as calculated from the following:   Height as of this encounter: _1  (1.88 m).   Weight as of this encounter: 320 lb (145.2 kg). Ideal: Ideal body weight: 82.2 kg (181 lb 3.5 oz) Adjusted ideal body weight: 107.4 kg (236 lb 11.7 oz)  Cervical Spine Exam  Skin & Axial Inspection: No masses, redness, edema, swelling, or associated skin lesions Alignment: Symmetrical Functional ROM: Pain restricted ROM      Stability: No instability detected Muscle Tone/Strength: Functionally intact. No obvious neuro-muscular anomalies detected. Sensory (Neurological): Musculoskeletal pain pattern Palpation: No palpable anomalies                    Upper Extremity (UE) Exam    Side: Right upper extremity  Side: Left upper extremity   Skin & Extremity Inspection: Skin color, temperature, and hair growth are WNL. No peripheral edema or cyanosis. No masses, redness, swelling, asymmetry, or associated skin lesions. No contractures.  Skin & Extremity Inspection: Skin color, temperature, and hair growth are WNL. No peripheral edema or cyanosis. No masses, redness, swelling, asymmetry, or associated skin lesions. No contractures.   Functional ROM: Unrestricted ROM          Functional ROM: Unrestricted ROM           Muscle Tone/Strength: Functionally intact. No obvious neuro-muscular anomalies detected.  Muscle Tone/Strength: Functionally intact. No obvious neuro-muscular anomalies detected.   Sensory (Neurological):  Unimpaired          Sensory (Neurological): Unimpaired                      Thoracic Spine Area Exam  Skin & Axial Inspection: No masses, redness, or swelling Alignment: Symmetrical Functional ROM: Decreased ROM Stability: No instability detected Muscle Tone/Strength: Functionally intact. No obvious neuro-muscular anomalies detected. Sensory (Neurological): Musculoskeletal pain pattern Muscle strength & Tone: No palpable anomalies  Lumbar Exam  Skin & Axial Inspection: No masses, redness, or swelling Alignment: Symmetrical Functional ROM: Decreased ROM affecting both sides Stability: No instability detected Muscle Tone/Strength: Functionally intact. No obvious neuro-muscular anomalies detected. Sensory (Neurological): Musculoskeletal pain pattern  Gait & Posture Assessment  Ambulation: Limited Gait: Age-related, senile gait pattern Posture: Difficulty standing up straight, due to pain   Lower Extremity Exam    Side: Right lower extremity  Side: Left lower extremity  Stability: No instability observed          Stability: No instability observed          Skin & Extremity Inspection: Edema  Skin & Extremity Inspection: Pitting edema venous stasis changes also noted  Functional ROM: Unrestricted ROM                  Functional ROM: Unrestricted ROM                  Muscle Tone/Strength: Functionally intact. No obvious neuro-muscular anomalies detected.  Muscle Tone/Strength: Functionally intact. No obvious neuro-muscular anomalies detected.  Sensory (Neurological): Neurogenic pain pattern        Sensory (Neurological): Neurogenic pain pattern        DTR: Patellar: deferred today Achilles: deferred today Plantar: deferred today  DTR: Patellar: deferred today Achilles: deferred today Plantar: deferred today  Palpation: No palpable anomalies  Palpation: No palpable anomalies     Assessment   Status Diagnosis  Controlled Controlled Controlled 1. Lumbar  spondylosis   2. Chronic pain syndrome   3. Lumbar radiculopathy   4. Lumbar facet arthropathy   5. Lumbar degenerative disc disease   6. Primary osteoarthritis of right knee   7. Primary osteoarthritis of left knee   8. Primary osteoarthritis of left hip      Updated Problems: Problem  Lumbar Radiculopathy, Chronic  Chronic Pain Syndrome  Pain in Joint, Pelvic Region and Thigh  Pain in Right Hip  Aortic Valve Disorder  S/P Aortic Valve Replacement  Long Term Current Use of Anticoagulant  OBESITY   Qualifier: Diagnosis of  By: Ronne Binning     Obstructive Sleep Apnea   HST-02/01/2018-[facility] 36.7/hour, desaturation to 63%, body weight 324 pounds      Plan of Care  [name] has a current medication list which includes the following long-term medication(s): atorvastatin, iron, furosemide, metoprolol succinate, potassium chloride, pregabalin, and warfarin.  Discontinue Butrans patch.  Will call pharmacy and cancel his current prescription of buprenorphine.  He states that he did not experience satisfactory pain relief with buprenorphine.  For this reason, we are going back to his previous regimen of Percocet 10 mg 4 times daily as needed, quantity 120/month.  He was counseled on the risks of this medication in the context of his morbid obesity and obstructive sleep apnea.  Recommend the patient not take any other sedative medications while he is taking oxycodone.  We also discussed the importance of weight loss and exercise.  We discussed dieting and techniques to lose weight.  Patient states that he will try to incorporate those into his daily routine as he understands the importance of losing weight in regards to his cardiovascular health as well as his chronic pain.  Pharmacotherapy (Medications Ordered): Meds ordered this encounter  Medications   oxyCODONE-acetaminophen (PERCOCET) 10-325 MG tablet    Sig: Take 1 tablet by mouth every 6 (six) hours as needed for  pain. Must last 30 days.    Dispense:  120 tablet    Refill:  0    Chronic Pain. (STOP Act - Not applicable). Fill one day early if closed on scheduled refill date.   oxyCODONE-acetaminophen (PERCOCET) 10-325 MG tablet    Sig: Take 1 tablet by mouth every 6 (six) hours as needed for pain. Must last 30 days.    Dispense:  120 tablet    Refill:  0    Chronic Pain. (STOP Act - Not applicable). Fill one day early if closed on scheduled refill date.   Follow-up plan:   Return in about 8 weeks (around 02/15/2020) for Medication Management, in person.   Recent Visits Date Type Provider Dept  11/08/19 Office Visit Lance Santa, MD [facility]  Showing recent visits within past 90 days and meeting all other requirements Today's Visits Date Type Provider Dept  12/21/19 Office Visit Lance Santa, MD [facility]  Showing today's visits and meeting all other requirements Future Appointments Date Type Provider Dept  02/16/20 Appointment Lance Santa, MD [facility]  Showing future appointments within next 90 days and meeting all other requirements  I discussed the assessment and treatment plan with the patient. The patient was provided an opportunity to ask questions and all were answered. The patient agreed with the plan and demonstrated an understanding of the instructions.  Patient advised to call back or seek an in-person evaluation if the symptoms or condition worsens.  Duration of encounter: 31 minutes.  Note by: Lance Santa, MD Date: 12/21/2019; Time: 3:17 PM

## 2019-12-21 NOTE — Patient Instructions (Signed)
Description   Continue on same dosage 1.5 tablets daily except 2 tablets on Fridays.  Recheck INR in 6 weeks. Call us when your procedure date is set [phone] & Fax number is [phone]

## 2019-12-21 NOTE — Patient Instructions (Addendum)
Cancel butrans Rx and Percocet #60 that is currently at pharmacy  New Rx for #120 sent to pharmacy for 2 months

## 2019-12-22 ENCOUNTER — Ambulatory Visit (HOSPITAL_COMMUNITY)
Admission: RE | Admit: 2019-12-22 | Discharge: 2019-12-22 | Disposition: A | Discharge status: Home / Self Care | Payer: 59 | Source: Ambulatory Visit | Attending: Vascular Surgery | Admitting: Vascular Surgery

## 2019-12-22 DIAGNOSIS — I8311 Varicose veins of right lower extremity with inflammation: Secondary | ICD-10-CM

## 2019-12-23 ENCOUNTER — Other Ambulatory Visit: Payer: Self-pay

## 2019-12-23 ENCOUNTER — Ambulatory Visit (INDEPENDENT_AMBULATORY_CARE_PROVIDER_SITE_OTHER): Payer: 59 | Admitting: Physician Assistant

## 2019-12-23 VITALS — BP 106/63 | HR 74 | Temp 97.6°F | Resp 20 | Ht 74.0 in | Wt 324.7 lb

## 2019-12-23 DIAGNOSIS — I872 Venous insufficiency (chronic) (peripheral): Secondary | ICD-10-CM

## 2019-12-23 NOTE — Progress Notes (Signed)
Requested by:  Myles Lipps, MD [address]. [place],  [place] [address]  Reason for consultation: Multiple episodes of bleeding from lower extremity varicosities   History of Present Illness   [name] is a 59 y.o. ([birth year]) male who presents for evaluation of bleeding lower extremity varicosities.  The patient's wife accompanies him in the office today.  Patient is maintained on Coumadin with a history of aortic valve replacement.  Pertinent medical history includes AAA repair, chronic pain syndrome, hypertension, prediabetes.  He was seen recently by his primary care physician with complaints of episodes of bleeding from lower extremity varicosities requiring evaluation of the emergency department.  Venous symptoms include:   + aching,swelling, bleeding Onset/duration: Years Occupation: Retired Aggravating factors: Sitting and standing Alleviating factors: Elevation  compression:  yes Helps: Yes  pain medications: None Previous vein procedures: No History of DVT: No  Past Medical History:  Diagnosis Date  . Ascending aortic aneurysm and dissection    2006 repaired  7 cm aneurysm  . Chest pain, atypical   . Coronary artery reimplantation   . Hyperlipidemia    Mixed  . Hypertension    Unspecified  . Nuclear stress test    Myoview 08/2018:  EF 58, no scar or ischemia; Low Risk  . Obesity   . Psoriasis   . S/P aortic valve and root replacement    HX of St. Jude  . Seizures (HCC)    one time incident 2018  . Sleep apnea, obstructive     Past Surgical History:  Procedure Laterality Date  . Ascending aortic dissection aneurysm  12/06/2004   ok for MRI 1.5 or 3T Using a 27-mm St. Jude mechanical valve conduit with reimplantation of both coronary arteries  . CORNEAL TRANSPLANT     R   . KNEE SURGERY     R  . Replacement of aortic valve  12/06/2004    Social History   Socioeconomic History  . Marital status: Married    Spouse name: Not on file    . Number of children: 0  . Years of education: 34  . Highest education level: Not on file  Occupational History  . Occupation: MIXER/PACKER    Employer: [employer]    Comment: works at [employer]- inhales vapors  Tobacco Use  . Smoking status: Former Smoker    Packs/day: 0.10    Years: 18.00    Pack years: 1.80    Types: Cigarettes    Quit date: 12/07/1989    Years since quitting: 30.0  . Smokeless tobacco: Never Used  Vaping Use  . Vaping Use: Never used  Substance and Sexual Activity  . Alcohol use: No    Comment: Quit 1991  . Drug use: No  . Sexual activity: Not on file  Other Topics Concern  . Not on file  Social History Narrative   Married   No regular exercise   Currently disabled      Patient drinks 2 cups of coffee a day    Patient is right handed.    Social Determinants of Health   Financial Resource Strain:   . Difficulty of Paying Living Expenses:   Food Insecurity:   . Worried About Programme researcher, broadcasting/film/video in the Last Year:   . Barista in the Last Year:   Transportation Needs:   . Freight forwarder (Medical):   Marland Kitchen Lack of Transportation (Non-Medical):   Physical Activity:   .  Days of Exercise per Week:   . Minutes of Exercise per Session:   Stress:   . Feeling of Stress :   Social Connections:   . Frequency of Communication with Friends and Family:   . Frequency of Social Gatherings with Friends and Family:   . Attends Religious Services:   . Active Member of Clubs or Organizations:   . Attends BankerClub or Organization Meetings:   Marland Kitchen. Marital Status:   Intimate Partner Violence:   . Fear of Current or Ex-Partner:   . Emotionally Abused:   Marland Kitchen. Physically Abused:   . Sexually Abused:     Family History  Problem Relation Age of Onset  . Cervical cancer Mother   . Coronary artery disease Mother   . Diabetes Mother   . Diabetes Father   . Cancer Father        lung cancer   . Obesity Father   . Alcohol abuse Father   .  Cervical cancer Sister   . Cancer Brother        lung cancer  . Alcohol abuse Brother   . Kidney disease Other        dialysis    Current Outpatient Medications  Medication Sig Dispense Refill  . acetaminophen (TYLENOL) 325 MG tablet Take 2 tablets (650 mg total) by mouth every 6 (six) hours as needed. 30 tablet 0  . aspirin 81 MG tablet Take 81 mg by mouth daily.    Marland Kitchen. atorvastatin (LIPITOR) 20 MG tablet Take 1 tablet (20 mg total) by mouth daily. 90 tablet 3  . Cholecalciferol (VITAMIN D) 50 MCG (2000 UT) tablet Take 1 tablet (2,000 Units total) by mouth 2 (two) times daily. 180 tablet 0  . Ferrous Sulfate (IRON) 325 (65 Fe) MG TABS Take 1 tablet by mouth once daily 90 tablet 0  . furosemide (LASIX) 40 MG tablet Take 1.5 tablets (60 mg total) by mouth 2 (two) times daily. 90 tablet 1  . metoprolol succinate (TOPROL-XL) 50 MG 24 hr tablet Take 1 tablet (50 mg total) by mouth daily. 90 tablet 3  . Multiple Vitamins-Minerals (CENTRUM SILVER ADULT 50+) TABS 1 qd    . oxyCODONE-acetaminophen (PERCOCET) 10-325 MG tablet Take 1 tablet by mouth every 6 (six) hours as needed for pain. Must last 30 days. 120 tablet 0  . [START ON 01/20/2020] oxyCODONE-acetaminophen (PERCOCET) 10-325 MG tablet Take 1 tablet by mouth every 6 (six) hours as needed for pain. Must last 30 days. 120 tablet 0  . potassium chloride (MICRO-K) 10 MEQ CR capsule Take 1 capsule (10 mEq total) by mouth daily. 90 capsule 1  . pregabalin (LYRICA) 75 MG capsule Take 1 capsule (75 mg total) by mouth 2 (two) times daily. 60 capsule 5  . Secukinumab, 300 MG Dose, (COSENTYX, 300 MG DOSE,) 150 MG/ML SOSY Inject 300 mg into the skin every 28 (twenty-eight) days.    . vitamin C (ASCORBIC ACID) 500 MG tablet Take 1,000 mg by mouth daily.     Marland Kitchen. warfarin (COUMADIN) 5 MG tablet TAKE AS DIRECTED BY COUMADIN CLINIC 150 tablet 0   No current facility-administered medications for this visit.    Allergies  Allergen Reactions  . Nsaids Other  (See Comments)    Told not to take meds-per patient    REVIEW OF SYSTEMS (negative unless checked):   Cardiac:  []  Chest pain or chest pressure? []  Shortness of breath upon activity? []  Shortness of breath when lying flat? []  Irregular heart rhythm?  Vascular:  []  Pain in calf, thigh, or hip brought on by walking? []  Pain in feet at night that wakes you up from your sleep? []  Blood clot in your veins? [x]  Leg swelling?  Pulmonary:  []  Oxygen at home? []  Productive cough? []  Wheezing?  Neurologic:  []  Sudden weakness in arms or legs? []  Sudden numbness in arms or legs? []  Sudden onset of difficult speaking or slurred speech? []  Temporary loss of vision in one eye? []  Problems with dizziness?  Gastrointestinal:  []  Blood in stool? []  Vomited blood?  Genitourinary:  []  Burning when urinating? []  Blood in urine?  Psychiatric:  []  Major depression  Hematologic:  [x]  Bleeding problems? []  Problems with blood clotting?  Dermatologic:  []  Rashes or ulcers?  Constitutional:  []  Fever or chills?  Ear/Nose/Throat:  []  Change in hearing? []  Nose bleeds? []  Sore throat?  Musculoskeletal:  []  Back pain? []  Joint pain? []  Muscle pain?   Physical Examination     Vitals:   12/23/19 1308  BP: 106/63  Pulse: 74  Resp: 20  Temp: 97.6 F (36.4 C)  SpO2: 95%   General:  WDWN in NAD; vital signs documented above Gait: Not observed HENT: WNL, normocephalic Pulmonary: normal non-labored breathing , without Rales, rhonchi,  wheezing Cardiac: regular HR, with Murmurs with transmitted carotid bruits from harsh systolic heart murmur Abdomen: soft, NT, no masses Skin: without rashes Vascular Exam/Pulses:  Right Left  Radial 2+ (normal) 2+ (normal)  Ulnar  not evaluated  not evaluated  Femoral  2+  2+  Popliteal  unable to palpate  to palpate  DP 2+ (normal) 2+ (normal)  PT  not palpable  not palpable   Extremities: with varicose veins, without reticular  veins, with edema, with stasis pigmentation, with lipodermatosclerosis, without ulcers.  Fleshy, mobile, nontender right posterior distal thigh mass consistent with lipoma Musculoskeletal: no muscle wasting or atrophy  Neurologic: A&O X 3;  No focal weakness or paresthesias are detected Psychiatric:  The pt has Normal affect.      Reviewed the patient's laboratory results.  His INR was 3.6 on June 2 and 3.2 on July 14.  Non-invasive Vascular Imaging   BLE Venous Insufficiency Duplex (12/22/2019):  Summary:  Right:  - No evidence of deep vein thrombosis seen in the right lower extremity,  from the common femoral through the popliteal veins.  - Venous reflux is noted in the right common femoral vein.  - Venous reflux is noted in the right sapheno-femoral junction.  - Venous reflux is noted in the right greater saphenous vein in the thigh.  - Venous reflux is noted in the right greater saphenous vein in the calf.  - Venous reflux is noted in the right popliteal vein.  - Venous reflux is noted in the right short saphenous vein.    Left:  - No evidence of deep vein thrombosis seen in the left lower extremity,  from the common femoral through the popliteal veins.  - Venous reflux is noted in the left common femoral vein.  - Venous reflux is noted in the left sapheno-femoral junction.  - Venous reflux is noted in the left greater saphenous vein in the calf  and medial knee.  - Venous reflux is noted in the left femoral vein.  - Venous reflux is noted in the left popliteal vein.  - Venous reflux is noted in the left short saphenous vein.     Medical Decision Making   [name] is  a 59 y.o. male who presents with: Chronic venous stasis insufficiency.  Physical exam consistent with C4 disease.  There is no evidence of DVT of either lower extremity to the level of the popliteal veins. There is reflux at the saphenofemoral junction into the greater saphenous veins bilaterally   The right saphenous vein measures approximately 5-6 millimeters in the proximal and mid calf.  The left greater saphenous and superficial saphenous veins measure greater than 4 mm.    I discussed with the patient the use of 20-30 mm thigh high compression stockings and need for 3 month trial of such.  I explained to get maximal benefit he needs to place these on prior to getting out of bed in the mornings.  Also recommended regular elevation and provided elevation cushion written information.  We discussed what to do in the event he has lower extremity bleeding.  I recommended carrying with him at all times gauze pads and Ace wrap.  The patient will follow up in 3 months with Dr. Darrick Penna or Edilia Bo to evaluate the possibility of endovenous ablation. . Thank you for allowing [place] to participate in this patient's care.   Milinda Antis, PA-C [facility] Office: [phone]  12/23/2019, 12:49 PM  Clinic MD: Randie Heinz

## 2020-01-03 ENCOUNTER — Other Ambulatory Visit: Payer: Self-pay

## 2020-01-03 MED ORDER — ATORVASTATIN CALCIUM 20 MG PO TABS: 20.0000 mg | ORAL_TABLET | Freq: Every day | ORAL | 2 refills | Status: DC

## 2020-01-13 ENCOUNTER — Telehealth: Payer: Self-pay | Admitting: Family Medicine

## 2020-01-13 NOTE — Telephone Encounter (Signed)
Called back and LM pt is to see Nephrology due to lab results from 11/17/2019

## 2020-01-13 NOTE — Telephone Encounter (Signed)
Pt is calling because he received a call from [facility] to set up appt and he is not sure why .   Please reach out to patient

## 2020-02-07 ENCOUNTER — Ambulatory Visit: Payer: 59 | Admitting: Family Medicine

## 2020-02-08 ENCOUNTER — Other Ambulatory Visit: Payer: Self-pay

## 2020-02-08 ENCOUNTER — Ambulatory Visit (INDEPENDENT_AMBULATORY_CARE_PROVIDER_SITE_OTHER): Payer: 59 | Admitting: *Deleted

## 2020-02-08 DIAGNOSIS — I359 Nonrheumatic aortic valve disorder, unspecified: Secondary | ICD-10-CM

## 2020-02-08 DIAGNOSIS — Z7901 Long term (current) use of anticoagulants: Secondary | ICD-10-CM | POA: Diagnosis not present

## 2020-02-08 DIAGNOSIS — Z5181 Encounter for therapeutic drug level monitoring: Secondary | ICD-10-CM

## 2020-02-08 DIAGNOSIS — Z952 Presence of prosthetic heart valve: Secondary | ICD-10-CM | POA: Diagnosis not present

## 2020-02-08 LAB — POCT INR: INR: 3.7 — AB (ref 2.0–3.0)

## 2020-02-08 NOTE — Patient Instructions (Signed)
Description   Today take 1 tablet then continue on same dosage 1.5 tablets daily except 2 tablets on Fridays.  Recheck INR in 5 weeks. Call us when your procedure date is set [phone] & Fax number is [phone]

## 2020-02-16 ENCOUNTER — Other Ambulatory Visit: Payer: Self-pay

## 2020-02-16 ENCOUNTER — Encounter: Payer: Self-pay | Admitting: Student in an Organized Health Care Education/Training Program

## 2020-02-16 ENCOUNTER — Ambulatory Visit
Payer: 59 | Attending: Student in an Organized Health Care Education/Training Program | Admitting: Student in an Organized Health Care Education/Training Program

## 2020-02-16 ENCOUNTER — Ambulatory Visit: Payer: 59 | Admitting: Internal Medicine

## 2020-02-16 VITALS — BP 139/68 | HR 60 | Temp 97.2°F | Ht 74.0 in | Wt 328.0 lb

## 2020-02-16 DIAGNOSIS — G894 Chronic pain syndrome: Secondary | ICD-10-CM | POA: Diagnosis not present

## 2020-02-16 DIAGNOSIS — M1712 Unilateral primary osteoarthritis, left knee: Secondary | ICD-10-CM | POA: Diagnosis present

## 2020-02-16 DIAGNOSIS — M47816 Spondylosis without myelopathy or radiculopathy, lumbar region: Secondary | ICD-10-CM | POA: Insufficient documentation

## 2020-02-16 DIAGNOSIS — M1711 Unilateral primary osteoarthritis, right knee: Secondary | ICD-10-CM

## 2020-02-16 DIAGNOSIS — M5136 Other intervertebral disc degeneration, lumbar region: Secondary | ICD-10-CM

## 2020-02-16 DIAGNOSIS — M1612 Unilateral primary osteoarthritis, left hip: Secondary | ICD-10-CM

## 2020-02-16 DIAGNOSIS — M5416 Radiculopathy, lumbar region: Secondary | ICD-10-CM | POA: Diagnosis not present

## 2020-02-16 MED ORDER — OXYCODONE-ACETAMINOPHEN 10-325 MG PO TABS: 1.0000 | ORAL_TABLET | Freq: Four times a day (QID) | ORAL | 0 refills | Status: DC | PRN

## 2020-02-16 NOTE — Progress Notes (Signed)
PROVIDER NOTE: Information contained herein reflects review and annotations entered in association with encounter. Interpretation of such information and data should be left to medically-trained personnel. Information provided to patient can be located elsewhere in the medical record under "Patient Instructions". Document created using STT-dictation technology, any transcriptional errors that may result from process are unintentional.    Patient: [name]  Service Category: E/M  Provider: Gillis Santa, MD  DOB: 1961-03-02  DOS: 02/16/2020  Specialty: Interventional Pain Management  MRN: [id number]  Setting: Ambulatory outpatient  PCP: Rutherford Guys, MD  Type: Established Patient    Referring Provider: Rutherford Guys, MD  Location: Office  Delivery: Face-to-face     HPI  Reason for encounter: [name], a 59 y.o. year old male, is here today for evaluation and management of his Chronic pain syndrome [G89.4]. [name]'s primary complain today is Knee Pain Last encounter: Practice (12/21/2019). My last encounter with him was on 12/21/2019. Pertinent problems: [name] has OBESITY; Obstructive sleep apnea; Aortic valve disorder; S/P aortic valve replacement; Long term current use of anticoagulant; Chronic pain syndrome; Pain in joint, pelvic region and thigh; Pain in right hip; and Lumbar radiculopathy, chronic on their pertinent problem list. Pain Assessment: Severity of Chronic pain is reported as a 8 /10. Location: Knee Left, Right/Denies. Onset: More than a month ago. Quality: Aching, Burning, Constant. Timing: Constant. Modifying factor(s): meds. Vitals:  height is 6' 2" (1.88 m) and weight is 328 lb (148.8 kg) (abnormal). His temperature is 97.2 F (36.2 C) (abnormal). His blood pressure is 139/68 and his pulse is 60. His oxygen saturation is 96%.   No change in medical history since last visit.  Patient's pain is at baseline.  Patient continues multimodal pain regimen  as prescribed.  States that it provides pain relief and improvement in functional status.   Pharmacotherapy Assessment   Analgesic: Percocet 10 mg 4 times daily as needed, quantity 120/month MME equals 60   Monitoring: [place] PMP: PDMP reviewed during this encounter.       Pharmacotherapy: No side-effects or adverse reactions reported. Compliance: No problems identified. Effectiveness: Clinically acceptable.  Chauncey Fischer, RN  02/16/2020 12:54 PM  Sign when Signing Visit Nursing Pain Medication Assessment:  Safety precautions to be maintained throughout the outpatient stay will include: orient to surroundings, keep bed in low position, maintain call bell within reach at all times, provide assistance with transfer out of bed and ambulation.  Medication Inspection Compliance: [name] did not comply with our request to bring his pills to be counted. He was reminded that bringing the medication bottles, even when empty, is a requirement.  Medication: None brought in. Pill/Patch Count: None available to be counted. Bottle Appearance: No container available. Did not bring bottle(s) to appointment. Filled Date: N/A Last Medication intake:  TodaySafety precautions to be maintained throughout the outpatient stay will include: orient to surroundings, keep bed in low position, maintain call bell within reach at all times, provide assistance with transfer out of bed and ambulation.     UDS:  Summary  Date Value Ref Range Status  09/06/2019 Note  Final    Comment:    ==================================================================== Compliance Drug Analysis, Ur ==================================================================== Test                             Result       Flag       Units Drug Present  and Declared for Prescription Verification   Oxycodone                      591          EXPECTED   ng/mg creat   Oxymorphone                    1065         EXPECTED   ng/mg creat    Noroxycodone                   1752         EXPECTED   ng/mg creat   Noroxymorphone                 325          EXPECTED   ng/mg creat    Sources of oxycodone are scheduled prescription medications.    Oxymorphone, noroxycodone, and noroxymorphone are expected    metabolites of oxycodone. Oxymorphone is also available as a    scheduled prescription medication.   Pregabalin                     PRESENT      EXPECTED   Acetaminophen                  PRESENT      EXPECTED   Metoprolol                     PRESENT      EXPECTED Drug Absent but Declared for Prescription Verification   Salicylate                     Not Detected UNEXPECTED    Aspirin, as indicated in the declared medication list, is not always    detected even when used as directed. ==================================================================== Test                      Result    Flag   Units      Ref Range   Creatinine              129              mg/dL      >=20 ==================================================================== Declared Medications:  The flagging and interpretation on this report are based on the  following declared medications.  Unexpected results may arise from  inaccuracies in the declared medications.  **Note: The testing scope of this panel includes these medications:  Metoprolol (Toprol)  Oxycodone (Percocet)  Pregabalin (Lyrica)  **Note: The testing scope of this panel does not include small to  moderate amounts of these reported medications:  Acetaminophen (Tylenol)  Acetaminophen (Percocet)  Aspirin  **Note: The testing scope of this panel does not include the  following reported medications:  Atorvastatin (Lipitor)  Furosemide (Lasix)  Iron  Multivitamin (Centrum)  Potassium (Klor-Con)  Secukinumab (Cosentyx)  Vitamin C  Vitamin D  Vitamin D3  Warfarin (Coumadin) ==================================================================== For clinical consultation, please call [phone]. ====================================================================      ROS  Constitutional: Denies any fever or chills Gastrointestinal: No reported hemesis, hematochezia, vomiting, or acute GI distress Musculoskeletal: Denies any acute onset joint swelling, redness, loss of ROM, or weakness Neurological: No reported episodes of acute onset apraxia, aphasia, dysarthria, agnosia, amnesia, paralysis, loss of coordination, or loss of consciousness  Medication Review  Centrum  Silver Adult 50+, Iron, Secukinumab (300 MG Dose), Vitamin D, acetaminophen, aspirin, atorvastatin, furosemide, metoprolol succinate, oxyCODONE-acetaminophen, potassium chloride, pregabalin, vitamin C, and warfarin  History Review  Allergy: [name] is allergic to nsaids. Drug: [name]  reports no history of drug use. Alcohol:  reports no history of alcohol use. Tobacco:  reports that he quit smoking about 30 years ago. His smoking use included cigarettes. He has a 1.80 pack-year smoking history. He has never used smokeless tobacco. Social: [name]  reports that he quit smoking about 30 years ago. His smoking use included cigarettes. He has a 1.80 pack-year smoking history. He has never used smokeless tobacco. He reports that he does not drink alcohol and does not use drugs. Medical:  has a past medical history of Ascending aortic aneurysm and dissection, Chest pain, atypical, Coronary artery reimplantation, Hyperlipidemia, Hypertension, Nuclear stress test, Obesity, Psoriasis, S/P aortic valve and root replacement, Seizures ([place]), and Sleep apnea, obstructive. Surgical: [name]  has a past surgical history that includes Replacement of aortic valve (12/06/2004); Ascending aortic dissection aneurysm (12/06/2004); Knee surgery; and Corneal transplant. Family: family history includes Alcohol abuse in his brother and father; Cancer in his brother and father; Cervical cancer in his mother and sister;  Coronary artery disease in his mother; Diabetes in his father and mother; Kidney disease in an other family member; Obesity in his father.  Laboratory Chemistry Profile   Renal Lab Results  Component Value Date   BUN 14 11/15/2019   CREATININE 0.86 11/15/2019   BCR 16 11/15/2019   GFRAA 108 11/15/2019   GFRNONAA 94 11/15/2019     Hepatic Lab Results  Component Value Date   AST 13 08/04/2019   ALT 26 08/04/2019   ALBUMIN 3.8 08/04/2019   ALKPHOS 92 08/04/2019   LIPASE 21 07/26/2016     Electrolytes Lab Results  Component Value Date   NA 150 (H) 11/15/2019   K 3.8 11/15/2019   CL 109 (H) 11/15/2019   CALCIUM 8.9 11/15/2019   MG 1.9 03/18/2015     Bone No results found for: VD25OH, VD125OH2TOT, ZO1096EA5, WU9811BJ4, 25OHVITD1, 25OHVITD2, 25OHVITD3, TESTOFREE, TESTOSTERONE   Inflammation (CRP: Acute Phase) (ESR: Chronic Phase) No results found for: CRP, ESRSEDRATE, LATICACIDVEN     Note: Above Lab results reviewed.  Recent Imaging Review  VAS [place] LOWER EXTREMITY VENOUS REFLUX  Lower Venous Reflux Study  Indications: Edema, and varicosities.   Performing Technologist: Alvia Grove RVT    Examination Guidelines: A complete evaluation includes B-mode imaging, spectral Doppler, color Doppler, and power Doppler as needed of all accessible portions of each vessel. Bilateral testing is considered an integral part of a complete examination. Limited examinations for reoccurring indications may be performed as noted. The reflux portion of the exam is performed with the patient in reverse Trendelenburg. Significant venous reflux is defined as >500 ms in the superficial venous system, and >1 second in the deep venous system.    Venous Reflux Times +--------------+---------+------+-----------+------------+--------+ RIGHT         Reflux NoRefluxReflux TimeDiameter cmsComments                         Yes                                   +--------------+---------+------+-----------+------------+--------+ CFV  yes   >1 second                      +--------------+---------+------+-----------+------------+--------+ Popliteal               yes   >1 second                      +--------------+---------+------+-----------+------------+--------+ GSV at [facility]              yes    >500 ms      0.88             +--------------+---------+------+-----------+------------+--------+ GSV prox thigh                              0.46             +--------------+---------+------+-----------+------------+--------+ GSV mid thigh                               0.47    branches +--------------+---------+------+-----------+------------+--------+ GSV dist thigh          yes    >500 ms      0.41             +--------------+---------+------+-----------+------------+--------+ GSV at knee                                 0.35             +--------------+---------+------+-----------+------------+--------+ GSV prox calf           yes    >500 ms      0.33             +--------------+---------+------+-----------+------------+--------+ GSV mid calf                   >500 ms      0.30             +--------------+---------+------+-----------+------------+--------+ GSV dist calf                               0.47             +--------------+---------+------+-----------+------------+--------+ SSV Pop Fossa                               0.64             +--------------+---------+------+-----------+------------+--------+ SSV prox calf           yes    >500 ms      0.57             +--------------+---------+------+-----------+------------+--------+ SSV mid calf            yes    >500 ms      0.56             +--------------+---------+------+-----------+------------+--------+                                                     NWV       +--------------+---------+------+-----------+------------+--------+    +--------------+---------+------+-----------+------------+--------+ LEFT  Reflux NoRefluxReflux TimeDiameter cmsComments                         Yes                                  +--------------+---------+------+-----------+------------+--------+ CFV                     yes   >1 second                      +--------------+---------+------+-----------+------------+--------+ FV mid                  yes   >1 second                      +--------------+---------+------+-----------+------------+--------+ Popliteal               yes   >1 second                      +--------------+---------+------+-----------+------------+--------+ GSV at SFJ              yes    >500 ms      0.74             +--------------+---------+------+-----------+------------+--------+ GSV prox thigh                              0.41             +--------------+---------+------+-----------+------------+--------+ GSV mid thigh                               0.30             +--------------+---------+------+-----------+------------+--------+ GSV dist thigh                              0.39             +--------------+---------+------+-----------+------------+--------+ GSV at knee             yes    >500 ms      0.62             +--------------+---------+------+-----------+------------+--------+ GSV prox calf           yes    >500 ms      0.49             +--------------+---------+------+-----------+------------+--------+ GSV mid calf            yes    >500 ms      0.46             +--------------+---------+------+-----------+------------+--------+ GSV dist calf           yes    >500 ms      0.42             +--------------+---------+------+-----------+------------+--------+ SSV Pop Fossa                               0.62              +--------------+---------+------+-----------+------------+--------+ SSV prox calf  0.55             +--------------+---------+------+-----------+------------+--------+ SSV mid calf            yes    >500 ms      0.50             +--------------+---------+------+-----------+------------+--------+                                             0.32             +--------------+---------+------+-----------+------------+--------+        Summary: Right: - No evidence of deep vein thrombosis seen in the right lower extremity, from the common femoral through the popliteal veins. - Venous reflux is noted in the right common femoral vein. - Venous reflux is noted in the right sapheno-femoral junction. - Venous reflux is noted in the right greater saphenous vein in the thigh. - Venous reflux is noted in the right greater saphenous vein in the calf. - Venous reflux is noted in the right popliteal vein. - Venous reflux is noted in the right short saphenous vein.   Left: - No evidence of deep vein thrombosis seen in the left lower extremity, from the common femoral through the popliteal veins. - Venous reflux is noted in the left common femoral vein. - Venous reflux is noted in the left sapheno-femoral junction. - Venous reflux is noted in the left greater saphenous vein in the calf and medial knee. - Venous reflux is noted in the left femoral vein. - Venous reflux is noted in the left popliteal vein. - Venous reflux is noted in the left short saphenous vein.   *See table(s) above for measurements and observations.  Electronically signed by Ruta Hinds MD on 12/22/2019 at 3:54:49 PM.      Final   Note: Reviewed        Physical Exam  General appearance: Well nourished, well developed, and well hydrated. In no apparent acute distress Mental status: Alert, oriented x 3 (person, place, & time)       Respiratory: No evidence of acute  respiratory distress Eyes: PERLA Vitals: BP 139/68   Pulse 60   Temp (!) 97.2 F (36.2 C)   Ht 6' 2" (1.88 m)   Wt (!) 328 lb (148.8 kg)   SpO2 96%   BMI 42.11 kg/m  BMI: Estimated body mass index is 42.11 kg/m as calculated from the following:   Height as of this encounter: 6' 2" (1.88 m).   Weight as of this encounter: 328 lb (148.8 kg). Ideal: Ideal body weight: 82.2 kg (181 lb 3.5 oz) Adjusted ideal body weight: 108.8 kg (239 lb 14.9 oz)   Cervical Spine Exam  Skin & Axial Inspection:No masses, redness, edema, swelling, or associated skin lesions Alignment:Symmetrical Functional AVW:UJWJ restricted ROM Stability:No instability detected Muscle Tone/Strength:Functionally intact. No obvious neuro-muscular anomalies detected. Sensory (Neurological):Musculoskeletal pain pattern Palpation:No palpable anomalies        Upper Extremity (UE) Exam    Side:Right upper extremity  Side:Left upper extremity   Skin & Extremity Inspection:Skin color, temperature, and hair growth are WNL. No peripheral edema or cyanosis. No masses, redness, swelling, asymmetry, or associated skin lesions. No contractures.  Skin & Extremity Inspection:Skin color, temperature, and hair growth are WNL. No peripheral edema or cyanosis. No masses, redness, swelling, asymmetry, or associated skin lesions. No contractures.  Functional XNT:ZGYFVCBSWHQP ROM  Functional RFF:MBWGYKZLDJTT ROM   Muscle Tone/Strength:Functionally intact. No obvious neuro-muscular anomalies detected.  Muscle Tone/Strength:Functionally intact. No obvious neuro-muscular anomalies detected.   Sensory (Neurological):Unimpaired  Sensory (Neurological):Unimpaired              Thoracic Spine Area Exam  Skin & Axial Inspection:No masses, redness, or swelling Alignment:Symmetrical Functional SVX:BLTJQZESP ROM Stability:No instability  detected Muscle Tone/Strength:Functionally intact. No obvious neuro-muscular anomalies detected. Sensory (Neurological):Musculoskeletal pain pattern Muscle strength & Tone:No palpable anomalies  Lumbar Exam  Skin & Axial Inspection:No masses, redness, or swelling Alignment:Symmetrical Functional QZR:AQTMAUQJF ROMaffecting both sides Stability:No instability detected Muscle Tone/Strength:Functionally intact. No obvious neuro-muscular anomalies detected. Sensory (Neurological):Musculoskeletal pain pattern  Gait & Posture Assessment  Ambulation:Limited Gait:Age-related, senile gait pattern Posture:Difficulty standing up straight, due to pain  Lower Extremity Exam    Side:Right lower extremity  Side:Left lower extremity  Stability:No instability observed  Stability:No instability observed  Skin & Extremity Inspection:Edema  Skin & Extremity Inspection:Pitting edemavenous stasis changes also noted  Functional HLK:TGYBWLSLHTDS ROM   Functional KAJ:GOTLXBWIOMBT ROM   Muscle Tone/Strength:Functionally intact. No obvious neuro-muscular anomalies detected.  Muscle Tone/Strength:Functionally intact. No obvious neuro-muscular anomalies detected.  Sensory (Neurological):Neurogenic pain pattern  Sensory (Neurological):Neurogenic pain pattern  DTR: Patellar:deferred today Achilles:deferred today Plantar:deferred today  DTR: Patellar:deferred today Achilles:deferred today Plantar:deferred today  Palpation:No palpable anomalies  Palpation:No palpable anomalies      Assessment   Status Diagnosis  Controlled Controlled Controlled 1. Chronic pain syndrome   2. Lumbar radiculopathy   3. Primary osteoarthritis of right knee   4. Lumbar facet arthropathy   5. Lumbar spondylosis   6. Lumbar degenerative disc disease   7. Primary osteoarthritis of left hip   8. Primary osteoarthritis  of left knee       Plan of Care  [name] has a current medication list which includes the following long-term medication(s): atorvastatin, iron, furosemide, metoprolol succinate, [START ON 03/07/2020] oxycodone-acetaminophen, [START ON 04/06/2020] oxycodone-acetaminophen, [START ON 05/06/2020] oxycodone-acetaminophen, potassium chloride, warfarin, and pregabalin.  Pharmacotherapy (Medications Ordered): Meds ordered this encounter  Medications  . oxyCODONE-acetaminophen (PERCOCET) 10-325 MG tablet    Sig: Take 1 tablet by mouth every 6 (six) hours as needed for pain. Must last 30 days.    Dispense:  120 tablet    Refill:  0    Chronic Pain. (STOP Act - Not applicable). Fill one day early if closed on scheduled refill date.  Marland Kitchen oxyCODONE-acetaminophen (PERCOCET) 10-325 MG tablet    Sig: Take 1 tablet by mouth every 6 (six) hours as needed for pain. Must last 30 days.    Dispense:  120 tablet    Refill:  0    Chronic Pain. (STOP Act - Not applicable). Fill one day early if closed on scheduled refill date.  Marland Kitchen oxyCODONE-acetaminophen (PERCOCET) 10-325 MG tablet    Sig: Take 1 tablet by mouth every 6 (six) hours as needed for pain. Must last 30 days.    Dispense:  120 tablet    Refill:  0    Chronic Pain. (STOP Act - Not applicable). Fill one day early if closed on scheduled refill date.   Follow-up plan:   Return in about 3 months (around 05/27/2020) for Medication Management, in person.   Recent Visits Date Type Provider Dept  12/21/19 Office Visit Gillis Santa, MD [facility]  Showing recent visits within past 90 days and meeting all other requirements Today's Visits Date Type Provider Dept  02/16/20 Office Visit  Gillis Santa, MD [facility]  Showing today's visits and meeting all other requirements Future Appointments No visits were found meeting these conditions. Showing future appointments within next 90 days and meeting all other  requirements  I discussed the assessment and treatment plan with the patient. The patient was provided an opportunity to ask questions and all were answered. The patient agreed with the plan and demonstrated an understanding of the instructions.  Patient advised to call back or seek an in-person evaluation if the symptoms or condition worsens.  Duration of encounter: 30 minutes.  Note by: Gillis Santa, MD Date: 02/16/2020; Time: 1:29 PM

## 2020-02-16 NOTE — Progress Notes (Signed)
Nursing Pain Medication Assessment:  Safety precautions to be maintained throughout the outpatient stay will include: orient to surroundings, keep bed in low position, maintain call bell within reach at all times, provide assistance with transfer out of bed and ambulation.  Medication Inspection Compliance: [name] did not comply with our request to bring his pills to be counted. He was reminded that bringing the medication bottles, even when empty, is a requirement.  Medication: None brought in. Pill/Patch Count: None available to be counted. Bottle Appearance: No container available. Did not bring bottle(s) to appointment. Filled Date: N/A Last Medication intake:  TodaySafety precautions to be maintained throughout the outpatient stay will include: orient to surroundings, keep bed in low position, maintain call bell within reach at all times, provide assistance with transfer out of bed and ambulation.

## 2020-02-17 ENCOUNTER — Encounter: Payer: Self-pay | Admitting: Family Medicine

## 2020-02-17 ENCOUNTER — Ambulatory Visit (INDEPENDENT_AMBULATORY_CARE_PROVIDER_SITE_OTHER): Payer: 59 | Admitting: Family Medicine

## 2020-02-17 ENCOUNTER — Other Ambulatory Visit: Payer: Self-pay

## 2020-02-17 VITALS — BP 136/68 | HR 60 | Temp 97.9°F | Ht 74.0 in | Wt 327.0 lb

## 2020-02-17 DIAGNOSIS — Z86010 Personal history of colonic polyps: Secondary | ICD-10-CM

## 2020-02-17 DIAGNOSIS — I872 Venous insufficiency (chronic) (peripheral): Secondary | ICD-10-CM

## 2020-02-17 DIAGNOSIS — Z1211 Encounter for screening for malignant neoplasm of colon: Secondary | ICD-10-CM

## 2020-02-17 DIAGNOSIS — Z23 Encounter for immunization: Secondary | ICD-10-CM

## 2020-02-17 DIAGNOSIS — E870 Hyperosmolality and hypernatremia: Secondary | ICD-10-CM

## 2020-02-17 MED ORDER — FUROSEMIDE 40 MG PO TABS
40.0000 mg | ORAL_TABLET | Freq: Two times a day (BID) | ORAL | 1 refills | Status: DC
Start: 2020-02-17 — End: 2020-04-03

## 2020-02-17 MED ORDER — METOPROLOL SUCCINATE ER 50 MG PO TB24
50.0000 mg | ORAL_TABLET | Freq: Every day | ORAL | 3 refills | Status: DC
Start: 2020-02-17 — End: 2021-03-28

## 2020-02-17 NOTE — Progress Notes (Signed)
9/10/202112:06 PM  Loving [birth year], 59 y.o., male [id number]  Chief Complaint  Patient presents with  . Follow-up    htn heart disease, neuro, follow up from vein and kidney doctor. Requesting records from kidney doctor    HPI:   Patient is a 58 y.o. male with past medical history significant for HTN, prediabetes, HLP, mechanical AV replacement on coumadin, dHF, AAA repair, OSA on cpap, GERD, chronic pain (DDD lumbar spine with radiculopathy and bilateral knee OA and psoriasiswho presents today for followup  Last OV June 2021 - referred to vascular surgery and nephrology  Saw vein July 2021 - trial of compression stockings, fu in 3 months for eval for possible endovenous ablation  He is doing ok He has been using compression stockings, Feels tight but has not had anymore bleeding episodes Saw nephrology for elevated sodium, patient reports that renal stated it was secondary to lasix, they did not change diuretic Pending neuro appt for OSA mgt He reports last colonoscopy in 2014, h/o polyps, Dr Michail Sermon at [facility] is finally paying for his lyrica  Depression screen [facility] 2/9 12/21/2019 09/14/2019 09/05/2019  Decreased Interest 0 0 0  Down, Depressed, Hopeless 0 0 0  PHQ - 2 Score 0 0 0  Altered sleeping - - -  Tired, decreased energy - - -  Change in appetite - - -  Feeling bad or failure about yourself  - - -  Trouble concentrating - - -  Moving slowly or fidgety/restless - - -  Suicidal thoughts - - -  PHQ-9 Score - - -  Difficult doing work/chores - - -  Some recent data might be hidden    Fall Risk  02/16/2020 12/21/2019 11/08/2019 09/14/2019 09/05/2019  Falls in the past year? 0 1 0 0 0  Comment - "Knee gave out" - - -  Number falls in past yr: - 1 0 0 -  Injury with Fall? - 1 0 0 -  Follow up - - Falls evaluation completed Falls evaluation completed -     Allergies  Allergen Reactions  . Nsaids Other (See Comments)    Told not to take meds-per  patient    Prior to Admission medications   Medication Sig Start Date End Date Taking? Authorizing Provider  acetaminophen (TYLENOL) 325 MG tablet Take 2 tablets (650 mg total) by mouth every 6 (six) hours as needed. 05/20/18   Khatri, Hina, PA-C  atorvastatin (LIPITOR) 20 MG tablet Take 1 tablet (20 mg total) by mouth daily. 01/03/20   Sherren Mocha, MD  Cholecalciferol (VITAMIN D) 50 MCG (2000 UT) tablet Take 1 tablet (2,000 Units total) by mouth 2 (two) times daily. 11/16/18   Isaiah Serge, NP  Ferrous Sulfate (IRON) 325 (65 Fe) MG TABS Take 1 tablet by mouth once daily 02/16/19   Isaiah Serge, NP  furosemide (LASIX) 40 MG tablet Take 1.5 tablets (60 mg total) by mouth 2 (two) times daily. 09/14/19   Rutherford Guys, MD  metoprolol succinate (TOPROL-XL) 50 MG 24 hr tablet Take 1 tablet (50 mg total) by mouth daily. 11/16/18   Isaiah Serge, NP  Multiple Vitamins-Minerals (CENTRUM SILVER ADULT 50+) TABS 1 qd    [provider]  NARCAN 4 MG/0.1ML LIQD nasal spray kit 1 spray once. 02/06/20   [provider]  oxyCODONE-acetaminophen (PERCOCET) 10-325 MG tablet Take 1 tablet by mouth every 6 (six) hours as needed for pain. Must last 30 days. 03/07/20 04/06/20  Gillis Santa, MD  oxyCODONE-acetaminophen (PERCOCET) 10-325 MG tablet Take 1 tablet by mouth every 6 (six) hours as needed for pain. Must last 30 days. 04/06/20 05/06/20  Gillis Santa, MD  oxyCODONE-acetaminophen (PERCOCET) 10-325 MG tablet Take 1 tablet by mouth every 6 (six) hours as needed for pain. Must last 30 days. 05/06/20 06/05/20  Gillis Santa, MD  potassium chloride (MICRO-K) 10 MEQ CR capsule Take 1 capsule (10 mEq total) by mouth daily. 09/14/19 09/08/20  Rutherford Guys, MD  pregabalin (LYRICA) 75 MG capsule Take 1 capsule (75 mg total) by mouth 2 (two) times daily. 11/15/19 12/15/19  Rutherford Guys, MD  vitamin C (ASCORBIC ACID) 500 MG tablet Take 1,000 mg by mouth daily.     [provider]  warfarin  (COUMADIN) 5 MG tablet TAKE AS DIRECTED BY COUMADIN CLINIC 12/21/19   Sherren Mocha, MD    Past Medical History:  Diagnosis Date  . Ascending aortic aneurysm and dissection    2006 repaired  7 cm aneurysm  . Chest pain, atypical   . Coronary artery reimplantation   . Hyperlipidemia    Mixed  . Hypertension    Unspecified  . Nuclear stress test    Myoview 08/2018:  EF 58, no scar or ischemia; Low Risk  . Obesity   . Psoriasis   . S/P aortic valve and root replacement    HX of St. Jude  . Seizures ([place])    one time incident 2018  . Sleep apnea, obstructive     Past Surgical History:  Procedure Laterality Date  . Ascending aortic dissection aneurysm  12/06/2004   ok for MRI 1.5 or 3T Using a 27-mm St. Jude mechanical valve conduit with reimplantation of both coronary arteries  . CORNEAL TRANSPLANT     R   . KNEE SURGERY     R  . Replacement of aortic valve  12/06/2004    Social History   Tobacco Use  . Smoking status: Former Smoker    Packs/day: 0.10    Years: 18.00    Pack years: 1.80    Types: Cigarettes    Quit date: 12/07/1989    Years since quitting: 30.2  . Smokeless tobacco: Never Used  Substance Use Topics  . Alcohol use: No    Comment: Quit 1991    Family History  Problem Relation Age of Onset  . Cervical cancer Mother   . Coronary artery disease Mother   . Diabetes Mother   . Diabetes Father   . Cancer Father        lung cancer   . Obesity Father   . Alcohol abuse Father   . Cervical cancer Sister   . Cancer Brother        lung cancer  . Alcohol abuse Brother   . Kidney disease Other        dialysis    Review of Systems  Constitutional: Negative for chills and fever.  Respiratory: Negative for cough and shortness of breath.   Cardiovascular: Positive for leg swelling. Negative for chest pain and palpitations.  Gastrointestinal: Negative for abdominal pain, nausea and vomiting.     OBJECTIVE:  Today's Vitals   02/17/20 1155  BP:  136/68  Pulse: 60  Temp: 97.9 F (36.6 C)  SpO2: 100%  Weight: (!) 327 lb (148.3 kg)  Height: 6' 2"  (1.88 m)   Body mass index is 41.98 kg/m.   Physical Exam Vitals and nursing note reviewed.  Constitutional:  Appearance: He is well-developed.  HENT:     Head: Normocephalic and atraumatic.  Eyes:     Extraocular Movements: Extraocular movements intact.     Conjunctiva/sclera: Conjunctivae normal.     Pupils: Pupils are equal, round, and reactive to light.  Cardiovascular:     Rate and Rhythm: Normal rate and regular rhythm.     Heart sounds: No murmur heard.  No friction rub. No gallop.   Pulmonary:     Effort: Pulmonary effort is normal.     Breath sounds: Normal breath sounds. No wheezing, rhonchi or rales.  Musculoskeletal:     Cervical back: Neck supple.  Skin:    General: Skin is warm and dry.  Neurological:     Mental Status: He is alert and oriented to person, place, and time.     No results found for this or any previous visit (from the past 24 hour(s)).  No results found.   ASSESSMENT and PLAN  1. Hypernatremia Decrease lasix to 58m BID. Recheck BMP at next OV  2. Chronic venous insufficiency Currently using compression stockings. Fu with vein as scheduled  3. Need for vaccination - Flu Vaccine QUAD 36+ mos IM  4. History of colonic polyps 5. Screen for colon cancer - Ambulatory referral to Gastroenterology  Other orders - furosemide (LASIX) 40 MG tablet; Take 1 tablet (40 mg total) by mouth 2 (two) times daily. - metoprolol succinate (TOPROL-XL) 50 MG 24 hr tablet; Take 1 tablet (50 mg total) by mouth daily.  Return in about 2 weeks (around 03/02/2020).    IRutherford Guys MD [facility] [place] [address]Ph.  [phone] [phone]

## 2020-02-17 NOTE — Patient Instructions (Signed)
° ° ° °  If you have lab work done today you will be contacted with your lab results within the next 2 weeks.  If you have not heard from us then please contact us. The fastest way to get your results is to register for My Chart. ° ° °IF you received an x-ray today, you will receive an invoice from [facility]. Please contact [facility] at [phone] with questions or concerns regarding your invoice.  ° °IF you received labwork today, you will receive an invoice from [facility]. Please contact [facility] at [phone] with questions or concerns regarding your invoice.  ° °Our billing staff will not be able to assist you with questions regarding bills from these companies. ° °You will be contacted with the lab results as soon as they are available. The fastest way to get your results is to activate your My Chart account. Instructions are located on the last page of this paperwork. If you have not heard from us regarding the results in 2 weeks, please contact this office. °  ° ° ° °

## 2020-03-01 ENCOUNTER — Ambulatory Visit (INDEPENDENT_AMBULATORY_CARE_PROVIDER_SITE_OTHER): Payer: 59 | Admitting: Family Medicine

## 2020-03-01 ENCOUNTER — Other Ambulatory Visit: Payer: Self-pay

## 2020-03-01 ENCOUNTER — Encounter: Payer: Self-pay | Admitting: Family Medicine

## 2020-03-01 VITALS — BP 125/75 | HR 55 | Temp 97.6°F | Ht 74.0 in | Wt 323.2 lb

## 2020-03-01 DIAGNOSIS — E870 Hyperosmolality and hypernatremia: Secondary | ICD-10-CM | POA: Diagnosis not present

## 2020-03-01 DIAGNOSIS — Z119 Encounter for screening for infectious and parasitic diseases, unspecified: Secondary | ICD-10-CM

## 2020-03-01 DIAGNOSIS — E782 Mixed hyperlipidemia: Secondary | ICD-10-CM

## 2020-03-01 DIAGNOSIS — I10 Essential (primary) hypertension: Secondary | ICD-10-CM | POA: Diagnosis not present

## 2020-03-01 DIAGNOSIS — R7303 Prediabetes: Secondary | ICD-10-CM

## 2020-03-01 DIAGNOSIS — Z7901 Long term (current) use of anticoagulants: Secondary | ICD-10-CM

## 2020-03-01 NOTE — Progress Notes (Signed)
9/23/202110:31 AM  [name] [birth year], 59 y.o., male [id number]  Chief Complaint  Patient presents with  . Follow-up    2 week follow up, wanting to see if he needs shingles. Not sure if he has pox as kid    HPI:   Patient is a 59 y.o. male with past medical history significant for HTN, prediabetes, HLP, mechanical AV replacement on coumadin, dHF, AAA repair, OSA on cpap, GERD, chronic pain (DDD lumbar spine with radiculopathy and bilateral knee OA and psoriasiswho presents today for followup  Seen 2 weeks ago - decreased lasix to 29m BID due to elevated Na  Patient overall doing well He is requesting for proof of varicella infection as not sure if he should get shingrix He has not noticed any sign changes with decrease in lasix: no worsening swelling, no SOB or chest pain He has been trying to work on is diet, he is requesting referral to dietician He continues to wear his compression He has no acute concerns today  Lab Results  Component Value Date   CREATININE 0.86 11/15/2019   BUN 14 11/15/2019   NA 150 (H) 11/15/2019   K 3.8 11/15/2019   CL 109 (H) 11/15/2019   CO2 27 11/15/2019   Wt Readings from Last 3 Encounters:  03/01/20 (!) 323 lb 3.2 oz (146.6 kg)  02/17/20 (!) 327 lb (148.3 kg)  02/16/20 (!) 328 lb (148.8 kg)   BP Readings from Last 3 Encounters:  03/01/20 125/75  02/17/20 136/68  02/16/20 139/68    Depression screen PHQ 2/9 12/21/2019 09/14/2019 09/05/2019  Decreased Interest 0 0 0  Down, Depressed, Hopeless 0 0 0  PHQ - 2 Score 0 0 0  Altered sleeping - - -  Tired, decreased energy - - -  Change in appetite - - -  Feeling bad or failure about yourself  - - -  Trouble concentrating - - -  Moving slowly or fidgety/restless - - -  Suicidal thoughts - - -  PHQ-9 Score - - -  Difficult doing work/chores - - -  Some recent data might be hidden    Fall Risk  03/01/2020 02/16/2020 12/21/2019 11/08/2019 09/14/2019  Falls in the past year? 0 0 1  0 0  Comment - - "Knee gave out" - -  Number falls in past yr: 0 - 1 0 0  Injury with Fall? 0 - 1 0 0  Follow up - - - Falls evaluation completed Falls evaluation completed     Allergies  Allergen Reactions  . Nsaids Other (See Comments)    Told not to take meds-per patient    Prior to Admission medications   Medication Sig Start Date End Date Taking? Authorizing Provider  acetaminophen (TYLENOL) 325 MG tablet Take 2 tablets (650 mg total) by mouth every 6 (six) hours as needed. 05/20/18  Yes Khatri, Hina, PA-C  atorvastatin (LIPITOR) 20 MG tablet Take 1 tablet (20 mg total) by mouth daily. 01/03/20  Yes CSherren Mocha MD  Cholecalciferol (VITAMIN D) 50 MCG (2000 UT) tablet Take 1 tablet (2,000 Units total) by mouth 2 (two) times daily. 11/16/18  Yes IIsaiah Serge NP  Ferrous Sulfate (IRON) 325 (65 Fe) MG TABS Take 1 tablet by mouth once daily 02/16/19  Yes IIsaiah Serge NP  furosemide (LASIX) 40 MG tablet Take 1 tablet (40 mg total) by mouth 2 (two) times daily. 02/17/20  Yes SRutherford Guys MD  metoprolol succinate (TOPROL-XL) 50 MG 24  hr tablet Take 1 tablet (50 mg total) by mouth daily. 02/17/20  Yes Rutherford Guys, MD  Multiple Vitamins-Minerals (CENTRUM SILVER ADULT 50+) TABS 1 qd   Yes [provider]  NARCAN 4 MG/0.1ML LIQD nasal spray kit 1 spray once. 02/06/20  Yes [provider]  oxyCODONE-acetaminophen (PERCOCET) 10-325 MG tablet Take 1 tablet by mouth every 6 (six) hours as needed for pain. Must last 30 days. 03/07/20 04/06/20 Yes Gillis Santa, MD  oxyCODONE-acetaminophen (PERCOCET) 10-325 MG tablet Take 1 tablet by mouth every 6 (six) hours as needed for pain. Must last 30 days. 04/06/20 05/06/20 Yes Gillis Santa, MD  oxyCODONE-acetaminophen (PERCOCET) 10-325 MG tablet Take 1 tablet by mouth every 6 (six) hours as needed for pain. Must last 30 days. 05/06/20 06/05/20 Yes Gillis Santa, MD  potassium chloride (MICRO-K) 10 MEQ CR capsule Take 1 capsule  (10 mEq total) by mouth daily. 09/14/19 09/08/20 Yes Rutherford Guys, MD  vitamin C (ASCORBIC ACID) 500 MG tablet Take 1,000 mg by mouth daily.    Yes [provider]  warfarin (COUMADIN) 5 MG tablet TAKE AS DIRECTED BY COUMADIN CLINIC 12/21/19  Yes Sherren Mocha, MD  pregabalin (LYRICA) 75 MG capsule Take 1 capsule (75 mg total) by mouth 2 (two) times daily. 11/15/19 12/15/19  Rutherford Guys, MD    Past Medical History:  Diagnosis Date  . Ascending aortic aneurysm and dissection    2006 repaired  7 cm aneurysm  . Chest pain, atypical   . Coronary artery reimplantation   . Hyperlipidemia    Mixed  . Hypertension    Unspecified  . Nuclear stress test    Myoview 08/2018:  EF 58, no scar or ischemia; Low Risk  . Obesity   . Psoriasis   . S/P aortic valve and root replacement    HX of St. Jude  . Seizures ([place])    one time incident 2018  . Sleep apnea, obstructive     Past Surgical History:  Procedure Laterality Date  . Ascending aortic dissection aneurysm  12/06/2004   ok for MRI 1.5 or 3T Using a 27-mm St. Jude mechanical valve conduit with reimplantation of both coronary arteries  . CORNEAL TRANSPLANT     R   . KNEE SURGERY     R  . Replacement of aortic valve  12/06/2004    Social History   Tobacco Use  . Smoking status: Former Smoker    Packs/day: 0.10    Years: 18.00    Pack years: 1.80    Types: Cigarettes    Quit date: 12/07/1989    Years since quitting: 30.2  . Smokeless tobacco: Never Used  Substance Use Topics  . Alcohol use: No    Comment: Quit 1991    Family History  Problem Relation Age of Onset  . Cervical cancer Mother   . Coronary artery disease Mother   . Diabetes Mother   . Diabetes Father   . Cancer Father        lung cancer   . Obesity Father   . Alcohol abuse Father   . Cervical cancer Sister   . Cancer Brother        lung cancer  . Alcohol abuse Brother   . Kidney disease Other        dialysis    Review of Systems   Constitutional: Negative for chills and fever.  Respiratory: Negative for cough and shortness of breath.   Cardiovascular: Negative for chest  pain, palpitations and leg swelling.  Gastrointestinal: Negative for abdominal pain, nausea and vomiting.   Per hpi  OBJECTIVE:  Today's Vitals   03/01/20 1026  BP: 125/75  Pulse: (!) 55  Temp: 97.6 F (36.4 C)  SpO2: 94%  Weight: (!) 323 lb 3.2 oz (146.6 kg)  Height: 6' 2"  (1.88 m)   Body mass index is 41.5 kg/m.   Physical Exam Vitals and nursing note reviewed.  Constitutional:      Appearance: He is well-developed.  HENT:     Head: Normocephalic and atraumatic.  Eyes:     Extraocular Movements: Extraocular movements intact.     Conjunctiva/sclera: Conjunctivae normal.     Pupils: Pupils are equal, round, and reactive to light.  Cardiovascular:     Rate and Rhythm: Normal rate and regular rhythm.     Heart sounds: No murmur heard.  No friction rub. No gallop.   Pulmonary:     Effort: Pulmonary effort is normal.     Breath sounds: Normal breath sounds. No wheezing, rhonchi or rales.  Musculoskeletal:     Cervical back: Neck supple.  Skin:    General: Skin is warm and dry.  Neurological:     Mental Status: He is alert and oriented to person, place, and time.     No results found for this or any previous visit (from the past 24 hour(s)).  No results found.   ASSESSMENT and PLAN  1. Hypernatremia eval by renal. Decreased lasix. Rechecking labs today - Comprehensive metabolic panel  2. Screening examination for infectious disease If proof of past infection, advise shingrix vaccine - Varicella zoster antibody, IgG  3. Essential hypertension Controlled. Continue current regime.  - Ambulatory referral to diabetic education  4. Mixed hyperlipidemia Checking labs today, medications will be adjusted as needed.  - Lipid panel - Ambulatory referral to diabetic education  5. Prediabetes Checking labs today, cont  working on LFM - Hemoglobin A1c - Ambulatory referral to diabetic education  6. Long term current use of anticoagulant Managed by coumadin clinic - Ambulatory referral to diabetic education  Return in about 3 months (around 05/31/2020) for [facility] he is interested in esablishing with Merleen Nicely Just, NP.    Rutherford Guys, MD Primary Care at [place], [place] [address] Ph.  [phone] Fax [phone]

## 2020-03-01 NOTE — Patient Instructions (Signed)
pcp    If you have lab work done today you will be contacted with your lab results within the next 2 weeks.  If you have not heard from us then please contact us. The fastest way to get your results is to register for My Chart.   IF you received an x-ray today, you will receive an invoice from [facility]. Please contact [facility] at [phone] with questions or concerns regarding your invoice.   IF you received labwork today, you will receive an invoice from [facility]. Please contact [facility] at [phone] with questions or concerns regarding your invoice.   Our billing staff will not be able to assist you with questions regarding bills from these companies.  You will be contacted with the lab results as soon as they are available. The fastest way to get your results is to activate your My Chart account. Instructions are located on the last page of this paperwork. If you have not heard from us regarding the results in 2 weeks, please contact this office.     

## 2020-03-02 LAB — LIPID PANEL
Chol/HDL Ratio: 4.2 ratio (ref 0.0–5.0)
Cholesterol, Total: 171 mg/dL (ref 100–199)
HDL: 41 mg/dL (ref >39)
LDL Chol Calc (NIH): 105 mg/dL — ABNORMAL HIGH (ref 0–99)
Triglycerides: 139 mg/dL (ref 0–149)
VLDL Cholesterol Cal: 25 mg/dL (ref 5–40)

## 2020-03-02 LAB — COMPREHENSIVE METABOLIC PANEL
ALT: 23 IU/L (ref 0–44)
AST: 13 IU/L (ref 0–40)
Albumin/Globulin Ratio: 1.4 (ref 1.2–2.2)
Albumin: 4.3 g/dL (ref 3.8–4.8)
Alkaline Phosphatase: 105 IU/L (ref 44–121)
BUN/Creatinine Ratio: 12 (ref 10–24)
BUN: 12 mg/dL (ref 8–27)
Bilirubin Total: 0.5 mg/dL (ref 0.0–1.2)
CO2: 29 mmol/L (ref 20–29)
Calcium: 9.2 mg/dL (ref 8.6–10.2)
Chloride: 102 mmol/L (ref 96–106)
Creatinine, Ser: 0.99 mg/dL (ref 0.76–1.27)
GFR calc Af Amer: 95 mL/min/{1.73_m2} (ref >59)
GFR calc non Af Amer: 82 mL/min/{1.73_m2} (ref >59)
Globulin, Total: 3 g/dL (ref 1.5–4.5)
Glucose: 117 mg/dL — ABNORMAL HIGH (ref 65–99)
Potassium: 3.7 mmol/L (ref 3.5–5.2)
Sodium: 143 mmol/L (ref 134–144)
Total Protein: 7.3 g/dL (ref 6.0–8.5)

## 2020-03-02 LAB — VARICELLA ZOSTER ANTIBODY, IGG: Varicella zoster IgG: 1357 index (ref >165)

## 2020-03-02 LAB — HEMOGLOBIN A1C
Est. average glucose Bld gHb Est-mCnc: 126 mg/dL
Hgb A1c MFr Bld: 6 % — ABNORMAL HIGH (ref 4.8–5.6)

## 2020-03-12 ENCOUNTER — Other Ambulatory Visit: Payer: Self-pay | Admitting: Family Medicine

## 2020-03-14 ENCOUNTER — Ambulatory Visit (INDEPENDENT_AMBULATORY_CARE_PROVIDER_SITE_OTHER): Payer: 59

## 2020-03-14 ENCOUNTER — Other Ambulatory Visit: Payer: Self-pay

## 2020-03-14 DIAGNOSIS — Z7901 Long term (current) use of anticoagulants: Secondary | ICD-10-CM

## 2020-03-14 DIAGNOSIS — Z5181 Encounter for therapeutic drug level monitoring: Secondary | ICD-10-CM | POA: Diagnosis not present

## 2020-03-14 DIAGNOSIS — Z952 Presence of prosthetic heart valve: Secondary | ICD-10-CM

## 2020-03-14 DIAGNOSIS — I359 Nonrheumatic aortic valve disorder, unspecified: Secondary | ICD-10-CM

## 2020-03-14 LAB — POCT INR: INR: 5.3 — AB (ref 2.0–3.0)

## 2020-03-14 NOTE — Patient Instructions (Signed)
Description   Skip today and tomorrow's dosage of Warfarin, then start taking 1.5 tablets daily.  Recheck INR in 2 weeks. Call us when your procedure date is set [phone] & Fax number is [phone]

## 2020-03-16 ENCOUNTER — Ambulatory Visit: Payer: Self-pay

## 2020-03-16 ENCOUNTER — Ambulatory Visit: Payer: 59 | Attending: Internal Medicine

## 2020-03-16 DIAGNOSIS — Z23 Encounter for immunization: Secondary | ICD-10-CM

## 2020-03-16 NOTE — Progress Notes (Signed)
   Covid-19 Vaccination Clinic  Name:  [name]    MRN: [id number] DOB: 01-28-1961  03/16/2020  [name] was observed post Covid-19 immunization for 30 minutes based on pre-vaccination screening without incident. He was provided with Vaccine Information Sheet and instruction to access the V-Safe system.   [name] was instructed to call 911 with any severe reactions post vaccine: Marland Kitchen Difficulty breathing  . Swelling of face and throat  . A fast heartbeat  . A bad rash all over body  . Dizziness and weakness

## 2020-03-28 ENCOUNTER — Other Ambulatory Visit: Payer: Self-pay

## 2020-03-28 ENCOUNTER — Ambulatory Visit (INDEPENDENT_AMBULATORY_CARE_PROVIDER_SITE_OTHER): Payer: 59 | Admitting: Vascular Surgery

## 2020-03-28 ENCOUNTER — Encounter: Payer: Self-pay | Admitting: Vascular Surgery

## 2020-03-28 VITALS — BP 142/72 | HR 57 | Temp 97.9°F | Resp 18 | Ht 73.0 in | Wt 323.3 lb

## 2020-03-28 DIAGNOSIS — I83813 Varicose veins of bilateral lower extremities with pain: Secondary | ICD-10-CM

## 2020-03-28 NOTE — Progress Notes (Signed)
Patient is a 59 year old male who returns for follow-up today.  He was last seen by our PA in July 2021.  Patient's primary complaint has been areas of pretibial bleeding that have occurred on several occasions.  The right leg has bled 5 times.  The left once.  He has no prior history of DVT.  He did have a reflux study done at his last visit which showed primarily reflux in the lesser saphenous vein 6 mm vein diameter.  He is also overweight but has been losing some weight and is down about 25 pounds.  He has not had any further bleeding episodes.  He has been wearing his compression stockings.  Review of systems: He has no shortness of breath.  He has no chest pain.  Physical exam:  Vitals:   03/28/20 1324  BP: (!) 142/72  Pulse: (!) 57  Resp: 18  Temp: 97.9 F (36.6 C)  TempSrc: Temporal  SpO2: 97%  Weight: (!) 323 lb 4.8 oz (146.6 kg)  Height: 6\' 1"  (1.854 m)    Extremities: 2+ dorsalis pedis pulse bilaterally  Skin: Circumferential hemosiderin staining from the knee down into the ankle area bilaterally.  Slightly thickened skin.  Data: I repeated portions of the patient's ultrasound today with the SonoSite at the bedside.  This again shows a dilated left and right lesser saphenous vein 6 mm diameter.  I also reviewed the patient's previous images and study from July.  Assessment: Bilateral lesser saphenous venous reflux with skin changes and prior bleeding episodes.  Patient has been compliant with compression stockings for the last 3 months.  Plan: Staged right followed by left lesser saphenous vein ablation to improve overall skin condition and prevent bleeding episodes.  Our office will call him regarding scheduling in the near future pending insurance approval.  August, MD [facility] Office: [phone]

## 2020-03-29 ENCOUNTER — Ambulatory Visit (INDEPENDENT_AMBULATORY_CARE_PROVIDER_SITE_OTHER): Payer: 59

## 2020-03-29 DIAGNOSIS — Z7901 Long term (current) use of anticoagulants: Secondary | ICD-10-CM

## 2020-03-29 DIAGNOSIS — Z5181 Encounter for therapeutic drug level monitoring: Secondary | ICD-10-CM

## 2020-03-29 DIAGNOSIS — Z952 Presence of prosthetic heart valve: Secondary | ICD-10-CM | POA: Diagnosis not present

## 2020-03-29 DIAGNOSIS — I359 Nonrheumatic aortic valve disorder, unspecified: Secondary | ICD-10-CM

## 2020-03-29 LAB — POCT INR: INR: 3 (ref 2.0–3.0)

## 2020-03-29 NOTE — Patient Instructions (Signed)
Description   Continue on same dosage 1.5 tablets daily.  Recheck INR in 3 weeks. Call us when your procedure date is set [phone] & Fax number is [phone]

## 2020-03-31 ENCOUNTER — Encounter: Payer: Self-pay | Admitting: Pulmonary Disease

## 2020-03-31 ENCOUNTER — Other Ambulatory Visit: Payer: Self-pay | Admitting: Cardiovascular Disease

## 2020-03-31 DIAGNOSIS — Z5181 Encounter for therapeutic drug level monitoring: Secondary | ICD-10-CM

## 2020-03-31 DIAGNOSIS — I359 Nonrheumatic aortic valve disorder, unspecified: Secondary | ICD-10-CM

## 2020-03-31 DIAGNOSIS — Z7901 Long term (current) use of anticoagulants: Secondary | ICD-10-CM

## 2020-03-31 DIAGNOSIS — Z952 Presence of prosthetic heart valve: Secondary | ICD-10-CM

## 2020-04-03 ENCOUNTER — Ambulatory Visit: Payer: 59 | Admitting: Internal Medicine

## 2020-04-03 ENCOUNTER — Other Ambulatory Visit: Payer: Self-pay | Admitting: Student in an Organized Health Care Education/Training Program

## 2020-04-03 ENCOUNTER — Other Ambulatory Visit: Payer: Self-pay | Admitting: Family Medicine

## 2020-04-03 MED ORDER — POTASSIUM CHLORIDE ER 10 MEQ PO CPCR: 10.0000 meq | ORAL_CAPSULE | Freq: Every day | ORAL | 1 refills | Status: DC

## 2020-04-03 MED ORDER — FUROSEMIDE 40 MG PO TABS
40.0000 mg | ORAL_TABLET | Freq: Two times a day (BID) | ORAL | 1 refills | Status: DC
Start: 2020-04-03 — End: 2020-04-06

## 2020-04-03 NOTE — Telephone Encounter (Signed)
Patient called pharmacy to ask them to get script ready to fill and was told they did not have any scripts to fill for oxycodone. I check chart and he should have one for 04-06-20 and 05-07-20. Please call pharmacy then let patient know status.

## 2020-04-03 NOTE — Progress Notes (Deleted)
HPI male former smoker followed for OSA, complicated by HBP, aortic valve replacement, GERD, obesity, restrictive lung disease HST-02/01/2018-AHI 36.7/hour, desaturation to 63%, body weight 324 pounds  ===========================================================   03/23/2018-59 year old male former smoker followed for OSA, complicated by HBP, aortic valve replacement, GERD, obesity, restrictive lung disease HST-02/01/2018-AHI 36.7/hour, desaturation to 63%, body weight 324 pounds CPAP auto 5-20/[facility] new 02/18/2018 Body weight today 320 pounds -----OSA: DME [facility]. Pt states he is wearing CPAP mostly but feels his needs a different mask-has nasal pillows. DL attached.  Download began recording on the 20th of the month so percentages are not correct.  He was having a lot of short nights but is gradually improving.  Definitely less tired.  Nasal pillows mask is allowing his mouth to dry out.  We discussed humidifier and alternative mask styles.  04/03/20- 59 year old male former smoker followed for OSA, complicated by HBP, aortic valve replacement/ warfarin, GERD, obesity, restrictive lung disease CPAP auto 5-20/[facility] new 02/18/2018 Download- compliance 10%, AHI 13.2/ hr Body weight today- Covid vax- Flu vax-   CXR 1/14//2020- Cardiomegaly without acute disease.   ROS-see HPI   + = positive Constitutional:    weight loss, night sweats, fevers, chills, fatigue, lassitude. HEENT:    +headaches, +difficulty swallowing, +tooth/dental problems, sore throat,       sneezing, itching, ear ache, nasal congestion, post nasal drip, snoring CV:    chest pain, orthopnea, PND, swelling in lower extremities, anasarca,                                                  dizziness, palpitations Resp:   shortness of breath with exertion or at rest.                productive cough,   non-productive cough, coughing up of blood.              change in color of mucus.  wheezing.   Skin:    rash or lesions. GI:   No-   heartburn, indigestion, abdominal pain, nausea, vomiting, diarrhea,                 change in bowel habits, loss of appetite GU: dysuria, change in color of urine, no urgency or frequency.   flank pain. MS:   +joint pain, stiffness, decreased range of motion, back pain. Neuro-     nothing unusual Psych:  change in mood or affect.  depression or anxiety.   memory loss.  OBJ- Physical Exam General- Alert, Oriented, Affect-appropriate, Distress- none acute, + obese Skin- rash-none, lesions- none, excoriation- none Lymphadenopathy- none Head- atraumatic            Eyes- Gross vision intact, PERRLA, +strabismus,  conjunctivae and secretions clear            Ears- Hearing, canals-normal            Nose- Clear, no-Septal dev, mucus, polyps, erosion, perforation             Throat- Mallampati II-III , mucosa clear , drainage- none, tonsils- atrophic Neck- flexible , trachea midline, no stridor , thyroid nl, carotid no bruit Chest - symmetrical excursion , unlabored           Heart/CV- RRR , no murmur , no gallop  , no rub, nl s1 s2                           -  JVD- none , edema- none, stasis changes- none, varices- none           Lung- clear to P&A, wheeze- none, cough- none , dullness-none, rub- none           Chest wall-  Abd-  Br/ Gen/ Rectal- Not done, not indicated Extrem- cyanosis- none, clubbing, none, atrophy- none, strength- nl Neuro- grossly intact to observation

## 2020-04-03 NOTE — Telephone Encounter (Signed)
Pt need a refill  potassium chloride (MICRO-K) 10 MEQ CR capsule [[id number]]  furosemide (LASIX) 40 MG tablet [[id number]]  [id number] - [place] - [address]  [address] [place] ([place]) [place] [address]  Phone: [phone] Fax: [phone]

## 2020-04-03 NOTE — Telephone Encounter (Signed)
Requested medication (s) are due for refill today: Furosemide, yes  Requested medication (s) are on the active medication list: yes  Last refill:  03/12/20  Future visit scheduled: no  Notes to clinic:  previously seen by Dr Leretha Pol  Requested medication (s) are due for refill today: Potassium, yes  Requested medication (s) are on the active medication list: yes  Last refill:  09/14/19  Future visit scheduled: no  Notes to clinic:  previously seen by Dr Leretha Pol        Requested Prescriptions  Pending Prescriptions Disp Refills   furosemide (LASIX) 40 MG tablet 90 tablet 1    Sig: Take 1 tablet (40 mg total) by mouth 2 (two) times daily.      Cardiovascular:  Diuretics - Loop Failed - 04/03/2020  1:19 PM      Failed - Last BP in normal range    BP Readings from Last 1 Encounters:  03/28/20 (!) 142/72          Passed - K in normal range and within 360 days    Potassium  Date Value Ref Range Status  03/01/2020 3.7 3.5 - 5.2 mmol/L Final          Passed - Ca in normal range and within 360 days    Calcium  Date Value Ref Range Status  03/01/2020 9.2 8.6 - 10.2 mg/dL Final   Calcium, Ion  Date Value Ref Range Status  03/15/2016 1.12 (L) 1.15 - 1.40 mmol/L Final          Passed - Na in normal range and within 360 days    Sodium  Date Value Ref Range Status  03/01/2020 143 134 - 144 mmol/L Final          Passed - Cr in normal range and within 360 days    Creatinine, Ser  Date Value Ref Range Status  03/01/2020 0.99 0.76 - 1.27 mg/dL Final          Passed - Valid encounter within last 6 months    Recent Outpatient Visits           1 month ago Hypernatremia   [facility] Meda Coffee, MD   1 month ago Hypernatremia   [facility], Meda Coffee, MD   4 months ago Benign hypertensive heart disease with heart failure [facility])   [facility] Meda Coffee, MD   6 months ago Chronic diastolic heart failure [facility])    [facility] Meda Coffee, MD   7 months ago Chronic diastolic heart failure [facility])   [facility] Meda Coffee, MD                potassium chloride (MICRO-K) 10 MEQ CR capsule 90 capsule 1    Sig: Take 1 capsule (10 mEq total) by mouth daily.      Endocrinology:  Minerals - Potassium Supplementation Passed - 04/03/2020  1:19 PM      Passed - K in normal range and within 360 days    Potassium  Date Value Ref Range Status  03/01/2020 3.7 3.5 - 5.2 mmol/L Final          Passed - Cr in normal range and within 360 days    Creatinine, Ser  Date Value Ref Range Status  03/01/2020 0.99 0.76 - 1.27 mg/dL Final          Passed - Valid encounter within last  12 months    Recent Outpatient Visits           1 month ago Hypernatremia   [facility], Meda Coffee, MD   1 month ago Hypernatremia   [facility], Meda Coffee, MD   4 months ago Benign hypertensive heart disease with heart failure [facility])   [facility] Meda Coffee, MD   6 months ago Chronic diastolic heart failure [facility])   [facility] Meda Coffee, MD   7 months ago Chronic diastolic heart failure [facility])   [facility], Meda Coffee, MD

## 2020-04-04 ENCOUNTER — Encounter: Payer: Self-pay | Admitting: Pulmonary Disease

## 2020-04-04 ENCOUNTER — Other Ambulatory Visit: Payer: Self-pay

## 2020-04-04 ENCOUNTER — Ambulatory Visit (INDEPENDENT_AMBULATORY_CARE_PROVIDER_SITE_OTHER): Payer: 59 | Admitting: Pulmonary Disease

## 2020-04-04 VITALS — BP 132/70 | HR 52 | Temp 97.1°F | Ht 74.0 in | Wt 327.6 lb

## 2020-04-04 DIAGNOSIS — G4733 Obstructive sleep apnea (adult) (pediatric): Secondary | ICD-10-CM | POA: Diagnosis not present

## 2020-04-04 NOTE — Assessment & Plan Note (Signed)
Plan: New CPAP ordered today Follow-up in 3 months Resume CPAP use If AHI remains elevated despite new CPAP use may need to consider titration study

## 2020-04-04 NOTE — Progress Notes (Signed)
$'@Patient'v$  ID: [name], male    DOB: [birth year], 59 y.o.   MRN: [id number]  Chief Complaint  Patient presents with  . Follow-up    needs CPAP machine     Referring provider: No ref. provider found  HPI:  59 year old male former smoker followed in our office for severe obstructive sleep apnea  PMH: Hypertension, aortic valve replacement, GERD, obesity, restrictive lung disease Smoker/ Smoking History: Smoker Maintenance:  none Pt of: Dr. Annamaria Boots  04/04/2020  - Visit   59 year old male former smoker found our office for severe obstructive sleep apnea.  Established with Dr. Annamaria Boots.   Last seen in October/2019.  Patient with known severe obstructive sleep apnea.  Poor compliance on CPAP compliance reports see compliance were listed below:  03/04/2020-04/02/2020-7 on the last 30 days use, 3 of those days greater than 4 hours, average usage 4 hours and 1 minute, APAP setting 5-20, AHI 13.2  Patient reports that his machine is not working.  He believes the machine is over 72 years old.  His DME company is [employer].  Is been unable to use it.  He reports poor sleep quality as well as increased daytime fatigue.  He is up-to-date with his COVID-19 vaccinations as well as flu vaccine.  He plans to discuss with primary care in December/2021 when he establishes with them whether or not he should receive the Shingrix vaccine.  Questionaires / Pulmonary Flowsheets:    Epworth:  Results of the Epworth flowsheet 01/11/2018  Sitting and reading 3  Watching TV 2  Sitting, inactive in a public place (e.g. a theatre or a meeting) 2  As a passenger in a car for an hour without a break 3  Lying down to rest in the afternoon when circumstances permit 3  Sitting and talking to someone 1  Sitting quietly after a lunch without alcohol 2  In a car, while stopped for a few minutes in traffic 0  Total score 16    Tests:   HST-02/01/2018-AHI 36.7/hour, desaturation to 63%, body weight 324  pounds  CPAP auto 5-20/[facility] new 02/18/2018  FENO:  No results found for: NITRICOXIDE  PFT: No flowsheet data found.  WALK:  No flowsheet data found.  Imaging: No results found.  Lab Results:  CBC    Component Value Date/Time   WBC 6.7 08/27/2018 0949   WBC 8.3 08/10/2018 0526   RBC 3.19 (L) 08/27/2018 0949   RBC 3.78 (L) 08/10/2018 0526   HGB 9.3 (L) 08/27/2018 0949   HCT 29.7 (L) 08/27/2018 0949   PLT 261 08/27/2018 0949   MCV 93 08/27/2018 0949   MCH 29.2 08/27/2018 0949   MCH 28.3 08/10/2018 0526   MCHC 31.3 (L) 08/27/2018 0949   MCHC 29.6 (L) 08/10/2018 0526   RDW 13.6 08/27/2018 0949   LYMPHSABS 1.5 08/10/2018 0526   MONOABS 0.8 08/10/2018 0526   EOSABS 0.1 08/10/2018 0526   BASOSABS 0.0 08/10/2018 0526    BMET    Component Value Date/Time   NA 143 03/01/2020 1102   K 3.7 03/01/2020 1102   CL 102 03/01/2020 1102   CO2 29 03/01/2020 1102   GLUCOSE 117 (H) 03/01/2020 1102   GLUCOSE 102 (H) 05/30/2018 1133   BUN 12 03/01/2020 1102   CREATININE 0.99 03/01/2020 1102   CALCIUM 9.2 03/01/2020 1102   GFRNONAA 82 03/01/2020 1102   GFRAA 95 03/01/2020 1102    BNP No results found for: BNP  ProBNP    Component Value  Date/Time   PROBNP 39 08/27/2018 0949   PROBNP 40.3 04/21/2014 1437    Specialty Problems      Pulmonary Problems   Obstructive sleep apnea    HST-02/01/2018-AHI 36.7/hour, desaturation to 63%, body weight 324 pounds       Restrictive lung disease    CXR 10/2010: no acute process PFT's 03/2011:  No obstruction, moderate restriction, normal DLCO.       Snoring   Common cold      Allergies  Allergen Reactions  . Nsaids Other (See Comments)    Told not to take meds-per patient    Immunization History  Administered Date(s) Administered  . Influenza,inj,Quad PF,6+ Mos 05/20/2016, 03/23/2018, 05/20/2019, 02/17/2020  . Influenza-Unspecified 02/16/2020  . PFIZER SARS-COV-2 Vaccination 08/01/2019, 08/22/2019, 03/16/2020  . Tdap  12/23/2011    Past Medical History:  Diagnosis Date  . Ascending aortic aneurysm and dissection    2006 repaired  7 cm aneurysm  . Chest pain, atypical   . Coronary artery reimplantation   . Hyperlipidemia    Mixed  . Hypertension    Unspecified  . Nuclear stress test    Myoview 08/2018:  EF 58, no scar or ischemia; Low Risk  . Obesity   . Psoriasis   . S/P aortic valve and root replacement    HX of [employer]  . Seizures ([place])    one time incident 2018  . Sleep apnea, obstructive     Tobacco History: Social History   Tobacco Use  Smoking Status Former Smoker  . Packs/day: 0.10  . Years: 18.00  . Pack years: 1.80  . Types: Cigarettes  . Quit date: 12/07/1989  . Years since quitting: 30.3  Smokeless Tobacco Never Used   Counseling given: Not Answered   Continue to not smoke  Outpatient Encounter Medications as of 04/04/2020  Medication Sig  . acetaminophen (TYLENOL) 325 MG tablet Take 2 tablets (650 mg total) by mouth every 6 (six) hours as needed.  Marland Kitchen atorvastatin (LIPITOR) 20 MG tablet Take 1 tablet (20 mg total) by mouth daily.  . Cholecalciferol (VITAMIN D) 50 MCG (2000 UT) tablet Take 1 tablet (2,000 Units total) by mouth 2 (two) times daily.  . Ferrous Sulfate (IRON) 325 (65 Fe) MG TABS Take 1 tablet by mouth once daily  . furosemide (LASIX) 40 MG tablet Take 1 tablet (40 mg total) by mouth 2 (two) times daily.  . metoprolol succinate (TOPROL-XL) 50 MG 24 hr tablet Take 1 tablet (50 mg total) by mouth daily.  . Multiple Vitamins-Minerals (CENTRUM SILVER ADULT 50+) TABS 1 qd  . NARCAN 4 MG/0.1ML LIQD nasal spray kit 1 spray once.  Derrill Memo ON 05/06/2020] oxyCODONE-acetaminophen (PERCOCET) 10-325 MG tablet Take 1 tablet by mouth every 6 (six) hours as needed for pain. Must last 30 days.  . potassium chloride (MICRO-K) 10 MEQ CR capsule Take 1 capsule (10 mEq total) by mouth daily.  . vitamin C (ASCORBIC ACID) 500 MG tablet Take 1,000 mg by mouth daily.   Marland Kitchen warfarin  (COUMADIN) 5 MG tablet TAKE 1.5 TABLETS DAILY OR AS DIRECTED BY COUMADIN CLINIC  . oxyCODONE-acetaminophen (PERCOCET) 10-325 MG tablet Take 1 tablet by mouth every 6 (six) hours as needed for pain. Must last 30 days. (Patient not taking: Reported on 04/04/2020)  . [START ON 04/06/2020] oxyCODONE-acetaminophen (PERCOCET) 10-325 MG tablet Take 1 tablet by mouth every 6 (six) hours as needed for pain. Must last 30 days. (Patient not taking: Reported on 04/04/2020)  .  pregabalin (LYRICA) 75 MG capsule Take 1 capsule (75 mg total) by mouth 2 (two) times daily.   No facility-administered encounter medications on file as of 04/04/2020.     Review of Systems  Review of Systems  Constitutional: Positive for fatigue. Negative for activity change, chills, fever and unexpected weight change.  HENT: Negative for postnasal drip, rhinorrhea, sinus pressure, sinus pain and sore throat.   Eyes: Negative.   Respiratory: Negative for cough, shortness of breath and wheezing.   Cardiovascular: Negative for chest pain and palpitations.  Gastrointestinal: Negative for constipation, diarrhea, nausea and vomiting.  Endocrine: Negative.   Genitourinary: Negative.   Musculoskeletal: Negative.   Skin: Negative.   Neurological: Negative for dizziness and headaches.  Psychiatric/Behavioral: Negative.  Negative for dysphoric mood. The patient is not nervous/anxious.   All other systems reviewed and are negative.    Physical Exam  BP 132/70 (Cuff Size: Large)   Pulse (!) 52   Temp (!) 97.1 F (36.2 C) (Temporal)   Ht $R'6\' 2"'yA$  (1.88 m)   Wt (!) 327 lb 9.6 oz (148.6 kg)   SpO2 97%   BMI 42.06 kg/m   Wt Readings from Last 5 Encounters:  04/04/20 (!) 327 lb 9.6 oz (148.6 kg)  03/28/20 (!) 323 lb 4.8 oz (146.6 kg)  03/01/20 (!) 323 lb 3.2 oz (146.6 kg)  02/17/20 (!) 327 lb (148.3 kg)  02/16/20 (!) 328 lb (148.8 kg)    BMI Readings from Last 5 Encounters:  04/04/20 42.06 kg/m  03/28/20 42.65 kg/m    03/01/20 41.50 kg/m  02/17/20 41.98 kg/m  02/16/20 42.11 kg/m     Physical Exam Vitals and nursing note reviewed.  Constitutional:      General: He is not in acute distress.    Appearance: Normal appearance. He is obese.  HENT:     Head: Normocephalic and atraumatic.     Right Ear: Hearing and external ear normal.     Left Ear: Hearing and external ear normal.     Nose: Rhinorrhea present. No mucosal edema.     Right Turbinates: Not enlarged.     Left Turbinates: Not enlarged.     Mouth/Throat:     Mouth: Mucous membranes are dry.     Pharynx: Oropharynx is clear. No oropharyngeal exudate.     Comments: +PND Eyes:     Pupils: Pupils are equal, round, and reactive to light.  Cardiovascular:     Rate and Rhythm: Normal rate and regular rhythm.     Pulses: Normal pulses.     Heart sounds: Normal heart sounds. No murmur heard.   Pulmonary:     Effort: Pulmonary effort is normal.     Breath sounds: Normal breath sounds. No decreased breath sounds, wheezing or rales.  Musculoskeletal:     Cervical back: Normal range of motion.     Right lower leg: No edema.     Left lower leg: No edema.  Lymphadenopathy:     Cervical: No cervical adenopathy.  Skin:    General: Skin is warm and dry.     Capillary Refill: Capillary refill takes less than 2 seconds.     Findings: No erythema or rash.  Neurological:     General: No focal deficit present.     Mental Status: He is alert and oriented to person, place, and time.     Motor: No weakness.     Coordination: Coordination normal.     Gait: Gait is intact. Gait normal.  Psychiatric:        Mood and Affect: Mood normal.        Behavior: Behavior normal. Behavior is cooperative.        Thought Content: Thought content normal.        Judgment: Judgment normal.       Assessment & Plan:   Obstructive sleep apnea Plan: New CPAP ordered today Follow-up in 3 months Resume CPAP use If AHI remains elevated despite new CPAP use  may need to consider titration study    Return in about 3 months (around 07/05/2020), or if symptoms worsen or fail to improve, for Follow up with Dr. Annamaria Boots.   Lauraine Rinne, NP 04/04/2020   This appointment required 26 minutes of patient care (this includes precharting, chart review, review of results, face-to-face care, etc.).

## 2020-04-04 NOTE — Telephone Encounter (Signed)
Patient notified by voicemail that he has 2 prescriptions that can be filled.  Informed him that the fill date was 04-06-2020 and to call us for any further questions or concerns.

## 2020-04-04 NOTE — Patient Instructions (Addendum)
You were seen today by Lance Ceo, NP  for:   1. Obstructive sleep apnea  - Ambulatory Referral for DME  We will order new CPAP for you today to replace her broken 1 Same settings Mask of choice DME: [facility]  We recommend that you continue using your CPAP daily >>>Keep up the hard work using your device >>> Goal should be wearing this for the entire night that you are sleeping, at least 4 to 6 hours  Remember:  . Do not drive or operate heavy machinery if tired or drowsy.  . Please notify the supply company and office if you are unable to use your device regularly due to missing supplies or machine being broken.  . Work on maintaining a healthy weight and following your recommended nutrition plan  . Maintain proper daily exercise and movement  . Maintaining proper use of your device can also help improve management of other chronic illnesses such as: Blood pressure, blood sugars, and weight management.   BiPAP/ CPAP Cleaning:  >>>Clean weekly, with Dawn soap, and bottle brush.  Set up to air dry. >>> Wipe mask out daily with wet wipe or towelette    We recommend today:  Orders Placed This Encounter  Procedures  . Ambulatory Referral for DME    Referral Priority:   Routine    Referral Type:   Durable Medical Equipment Purchase    Number of Visits Requested:   1   Orders Placed This Encounter  Procedures  . Ambulatory Referral for DME   No orders of the defined types were placed in this encounter.   Follow Up:    Return in about 3 months (around 07/05/2020), or if symptoms worsen or fail to improve, for Follow up with Dr. Maple Hudson.   Notification of test results are managed in the following manner: If there are  any recommendations or changes to the  plan of care discussed in office today,  we will contact you and let you know what they are. If you do not hear from [place], then your results are normal and you can view them through your  MyChart account , or a letter will be sent  to you. Thank you again for trusting [place] with your care  - Thank you, [facility]    It is flu season:   >>> Best ways to protect herself from the flu: Receive the yearly flu vaccine, practice good hand hygiene washing with soap and also using hand sanitizer when available, eat a nutritious meals, get adequate rest, hydrate appropriately       Please contact the office if your symptoms worsen or you have concerns that you are not improving.   Thank you for choosing [facility] for your healthcare, and for allowing [place] to partner with you on your healthcare journey. I am thankful to be able to provide care to you today.   Lance Headland FNP-C   Sleep Apnea Sleep apnea affects breathing during sleep. It causes breathing to stop for a short time or to become shallow. It can also increase the risk of:  Heart attack.  Stroke.  Being very overweight (obese).  Diabetes.  Heart failure.  Irregular heartbeat. The goal of treatment is to help you breathe normally again. What are the causes? There are three kinds of sleep apnea:  Obstructive sleep apnea. This is caused by a blocked or collapsed airway.  Central sleep apnea. This happens when the brain does not send the right  signals to the muscles that control breathing.  Mixed sleep apnea. This is a combination of obstructive and central sleep apnea. The most common cause of this condition is a collapsed or blocked airway. This can happen if:  Your throat muscles are too relaxed.  Your tongue and tonsils are too large.  You are overweight.  Your airway is too small. What increases the risk?  Being overweight.  Smoking.  Having a small airway.  Being older.  Being male.  Drinking alcohol.  Taking medicines to calm yourself (sedatives or tranquilizers).  Having family members with the condition. What are the signs or symptoms?  Trouble staying asleep.  Being sleepy or tired during the  day.  Getting angry a lot.  Loud snoring.  Headaches in the morning.  Not being able to focus your mind (concentrate).  Forgetting things.  Less interest in sex.  Mood swings.  Personality changes.  Feelings of sadness (depression).  Waking up a lot during the night to pee (urinate).  Dry mouth.  Sore throat. How is this diagnosed?  Your medical history.  A physical exam.  A test that is done when you are sleeping (sleep study). The test is most often done in a sleep lab but may also be done at home. How is this treated?   Sleeping on your side.  Using a medicine to get rid of mucus in your nose (decongestant).  Avoiding the use of alcohol, medicines to help you relax, or certain pain medicines (narcotics).  Losing weight, if needed.  Changing your diet.  Not smoking.  Using a machine to open your airway while you sleep, such as: ? An oral appliance. This is a mouthpiece that shifts your lower jaw forward. ? A CPAP device. This device blows air through a mask when you breathe out (exhale). ? An EPAP device. This has valves that you put in each nostril. ? A BPAP device. This device blows air through a mask when you breathe in (inhale) and breathe out.  Having surgery if other treatments do not work. It is important to get treatment for sleep apnea. Without treatment, it can lead to:  High blood pressure.  Coronary artery disease.  In men, not being able to have an erection (impotence).  Reduced thinking ability. Follow these instructions at home: Lifestyle  Make changes that your doctor recommends.  Eat a healthy diet.  Lose weight if needed.  Avoid alcohol, medicines to help you relax, and some pain medicines.  Do not use any products that contain nicotine or tobacco, such as cigarettes, e-cigarettes, and chewing tobacco. If you need help quitting, ask your doctor. General instructions  Take over-the-counter and prescription medicines only  as told by your doctor.  If you were given a machine to use while you sleep, use it only as told by your doctor.  If you are having surgery, make sure to tell your doctor you have sleep apnea. You may need to bring your device with you.  Keep all follow-up visits as told by your doctor. This is important. Contact a doctor if:  The machine that you were given to use during sleep bothers you or does not seem to be working.  You do not get better.  You get worse. Get help right away if:  Your chest hurts.  You have trouble breathing in enough air.  You have an uncomfortable feeling in your back, arms, or stomach.  You have trouble talking.  One side of your  body feels weak.  A part of your face is hanging down. These symptoms may be an emergency. Do not wait to see if the symptoms will go away. Get medical help right away. Call your local emergency services (911 in the [place] Do not drive yourself to the hospital. Summary  This condition affects breathing during sleep.  The most common cause is a collapsed or blocked airway.  The goal of treatment is to help you breathe normally while you sleep. This information is not intended to replace advice given to you by your health care provider. Make sure you discuss any questions you have with your health care provider. Document Revised: 03/12/2018 Document Reviewed: 01/19/2018 Elsevier Patient Education  2020 [employer].

## 2020-04-05 ENCOUNTER — Telehealth: Payer: Self-pay | Admitting: Cardiovascular Disease

## 2020-04-05 ENCOUNTER — Other Ambulatory Visit: Payer: Self-pay | Admitting: *Deleted

## 2020-04-05 DIAGNOSIS — I83813 Varicose veins of bilateral lower extremities with pain: Secondary | ICD-10-CM

## 2020-04-05 NOTE — Telephone Encounter (Signed)
Patient with diagnosis of mechanical AVR and afib on warfarin for anticoagulation.    Procedure: Endovenous Laser Ablation Right Small Saphenous Vein & Left Small Saphenous Vein  Date of procedure: 04/25/20 and again in Dec  CHA2DS2-VASc Score = 2  This indicates a 2.2% annual risk of stroke. The patient's score is based upon: CHF History: 1 HTN History: 1 Diabetes History: 0 Stroke History: 0 Vascular Disease History: 0 Age Score: 0 Gender Score: 0   CrCl >126mL/min Platelet count 261K  Per office protocol, patient can hold warfarin for 5 days prior to procedure due to hx of mechanical AVR and afib.  Patient WILL need bridging with Lovenox (enoxaparin) around procedure. Will coordinate this at [facility] where pt is followed. Moved his next INR check to 11/10 so that bridge can be coordinated at that visit.

## 2020-04-05 NOTE — Telephone Encounter (Signed)
   [facility] Pre-operative Risk Assessment    HEARTCARE STAFF: - Please ensure there is not already an duplicate clearance open for this procedure. - Under Visit Info/Reason for Call, type in Other and utilize the format Clearance MM/DD/YY or Clearance TBD. Do not use dashes or single digits. - If request is for dental extraction, please clarify the # of teeth to be extracted.  Request for surgical clearance:  1. What type of surgery is being performed? Endovenous Laser Ablation Right Small Saphenous Vein & Left Small Saphenous Vein   2. When is this surgery scheduled? 04/25/20 & 12/N/A/21  3. What type of clearance is required (medical clearance vs. Pharmacy clearance to hold med vs. Both)? Both   4. Are there any medications that need to be held prior to surgery and how long? Coumadin 5 days prior and 1 day following   5. Practice name and name of physician performing surgery? [facility], Dr. Scot Dock   6. What is the office phone number? [phone]   7.   What is the office fax number? [phone]  8.   Anesthesia type (None, local, MAC, general) ? Local /[place]    Trilby Drummer 04/05/2020, 3:27 PM  _________________________________________________________________  Please contact Dr. Nicole Cella nurse Davy Pique Rankin for further information or any questions you may have. [phone] option 4.

## 2020-04-06 ENCOUNTER — Other Ambulatory Visit: Payer: Self-pay | Admitting: Registered Nurse

## 2020-04-06 DIAGNOSIS — E870 Hyperosmolality and hypernatremia: Secondary | ICD-10-CM

## 2020-04-06 MED ORDER — FUROSEMIDE 40 MG PO TABS: 40.0000 mg | ORAL_TABLET | Freq: Two times a day (BID) | ORAL | 1 refills | Status: DC

## 2020-04-09 ENCOUNTER — Telehealth: Payer: Self-pay | Admitting: Family Medicine

## 2020-04-09 ENCOUNTER — Encounter: Payer: Self-pay | Admitting: Vascular Surgery

## 2020-04-09 DIAGNOSIS — G589 Mononeuropathy, unspecified: Secondary | ICD-10-CM

## 2020-04-09 NOTE — Telephone Encounter (Signed)
Pt called stating that he is not able to get his pregablin without a  PA. Please advise .     [id number] - [place] ([place] - [address]  [address] [place] [address]  Phone: [phone] Fax: [phone]  Hours: Not open 24 hours

## 2020-04-10 NOTE — Telephone Encounter (Signed)
Called the requesting office and spoke with Elisha Ponder and informed her that the patient is scheduled for 04/19/20 with Azalee Course, PA-C for cardiac clearance. She stated that she will pass it along to [place] [facility] and thanked me for calling.

## 2020-04-10 NOTE — Telephone Encounter (Signed)
Patient was called and scheduled for 04/19/20 to see Azalee Course, PA-C. Patient verbalized understanding and thanked for calling

## 2020-04-10 NOTE — Telephone Encounter (Signed)
Pt needs a call back, does not have Rx he needs. Needs PA

## 2020-04-10 NOTE — Telephone Encounter (Signed)
   Primary Cardiologist: Tonny Bollman, MD  Chart reviewed as part of pre-operative protocol coverage. Because of [name]'s past medical history and time since last visit, he will require a follow-up visit in order to better assess preoperative cardiovascular risk.  Pre-op covering staff: - Please schedule appointment and call patient to inform them. If patient already had an upcoming appointment within acceptable timeframe, please add "pre-op clearance" to the appointment notes so provider is aware. - Please contact requesting surgeon's office via preferred method (i.e, phone, fax) to inform them of need for appointment prior to surgery.  If applicable, this message will also be routed to pharmacy pool and/or primary cardiologist for input on holding anticoagulant/antiplatelet agent as requested below so that this information is available to the clearing provider at time of patient's appointment.   [place], [place]  04/10/2020, 2:24 PM

## 2020-04-10 NOTE — Telephone Encounter (Signed)
Pt is needing another PA on the lyrica

## 2020-04-12 NOTE — Telephone Encounter (Signed)
Called insurance company unable to due PA through covermy meds.    States patient does not need a prior [place].  Spoke with insurance and patient will need one they will be faxing over paperwork to be filled out.

## 2020-04-17 ENCOUNTER — Other Ambulatory Visit: Payer: Self-pay | Admitting: Family Medicine

## 2020-04-17 ENCOUNTER — Other Ambulatory Visit: Payer: Self-pay

## 2020-04-17 DIAGNOSIS — G589 Mononeuropathy, unspecified: Secondary | ICD-10-CM

## 2020-04-17 MED ORDER — PREGABALIN 75 MG PO CAPS: 75.0000 mg | ORAL_CAPSULE | Freq: Two times a day (BID) | ORAL | 3 refills | Status: DC

## 2020-04-17 MED ORDER — PREGABALIN 75 MG PO CAPS: 75.0000 mg | ORAL_CAPSULE | Freq: Two times a day (BID) | ORAL | 1 refills | Status: DC

## 2020-04-19 ENCOUNTER — Ambulatory Visit (INDEPENDENT_AMBULATORY_CARE_PROVIDER_SITE_OTHER): Payer: 59 | Admitting: *Deleted

## 2020-04-19 ENCOUNTER — Telehealth: Payer: Self-pay

## 2020-04-19 ENCOUNTER — Other Ambulatory Visit: Payer: Self-pay

## 2020-04-19 ENCOUNTER — Other Ambulatory Visit: Payer: Self-pay | Admitting: *Deleted

## 2020-04-19 ENCOUNTER — Ambulatory Visit (INDEPENDENT_AMBULATORY_CARE_PROVIDER_SITE_OTHER): Payer: 59 | Admitting: Physician Assistant

## 2020-04-19 ENCOUNTER — Ambulatory Visit: Payer: 59 | Admitting: Physician Assistant

## 2020-04-19 ENCOUNTER — Encounter: Payer: Self-pay | Admitting: Physician Assistant

## 2020-04-19 VITALS — BP 127/61 | HR 70 | Ht 74.0 in | Wt 316.6 lb

## 2020-04-19 DIAGNOSIS — I480 Paroxysmal atrial fibrillation: Secondary | ICD-10-CM | POA: Diagnosis not present

## 2020-04-19 DIAGNOSIS — Z5181 Encounter for therapeutic drug level monitoring: Secondary | ICD-10-CM | POA: Diagnosis not present

## 2020-04-19 DIAGNOSIS — Z7901 Long term (current) use of anticoagulants: Secondary | ICD-10-CM | POA: Diagnosis not present

## 2020-04-19 DIAGNOSIS — I5032 Chronic diastolic (congestive) heart failure: Secondary | ICD-10-CM | POA: Diagnosis not present

## 2020-04-19 DIAGNOSIS — E785 Hyperlipidemia, unspecified: Secondary | ICD-10-CM

## 2020-04-19 DIAGNOSIS — Z01818 Encounter for other preprocedural examination: Secondary | ICD-10-CM

## 2020-04-19 DIAGNOSIS — Z952 Presence of prosthetic heart valve: Secondary | ICD-10-CM

## 2020-04-19 DIAGNOSIS — I359 Nonrheumatic aortic valve disorder, unspecified: Secondary | ICD-10-CM | POA: Diagnosis not present

## 2020-04-19 DIAGNOSIS — I10 Essential (primary) hypertension: Secondary | ICD-10-CM

## 2020-04-19 LAB — POCT INR: INR: 3.5 — AB (ref 2.0–3.0)

## 2020-04-19 MED ORDER — ENOXAPARIN SODIUM 150 MG/ML ~~LOC~~ SOLN: 150.0000 mg | Freq: Two times a day (BID) | SUBCUTANEOUS | 1 refills | Status: DC

## 2020-04-19 MED ORDER — LORAZEPAM 1 MG PO TABS: ORAL_TABLET | ORAL | 0 refills | Status: DC

## 2020-04-19 NOTE — Progress Notes (Signed)
Cardiology Office Note:    Date:  04/21/2020   ID:  [name], DOB [birth year], MRN [id number]  PCP:  Just, Laurita Quint, FNP  [facility] Cardiologist:  Sherren Mocha, MD  [facility] Electrophysiologist:  None   Referring MD: Just, Laurita Quint, FNP   Chief Complaint  Patient presents with  . Follow-up    seen for Dr. Burt Knack    History of Present Illness:    [name] is a 59 y.o. male with a hx of thoracic aortic aneurysm and aortic valve disease s/p thoracic aortic repair with mechanical aortic valve replacement and reimplantation coronary arteries in 2006, PAF, chronic diastolic heart failure, hypertension, hyperlipidemia, OSA on CPAP, morbid obesity and venous insufficiency.  Cardiac catheterization performed on 12/06/2004 by Dr. Sabino Snipes showed severely dilated aortic root and ascending aorta, probable dissection flap near the origin of the RCA without contrast staining of a false lumen.  Last echocardiogram performed on 01/20/2018 showed stable St Jude 27 mm aortic valve replacement, EF 60 to 65%, grade 1 DD, severe right atrial dilatation, PA peak pressure 32 mmHg.  Myoview performed in March 2020 showed normal EF without any perfusion abnormality.  Patient presents today for follow-up.  He has 3 grandkids, a 30-year-old, 5-year-old and a 57-monthold.  He spends the day playing with his grandchildren.  He denies any obvious exertional chest pain or shortness of breath.  Although his knee issue is limiting his physical activity, he is still able to accomplish at least 4 METS of activity without significant limitation.  On exam, he has crisp aortic valve click with a 3 out of 6 murmur.  He has upcoming venous ablation by Dr. DScot Dock given the fact he is able to exercise at least 4 METS of activity and had a negative Myoview last year, he is cleared to proceed with venous ablation procedure.  He will need to hold Coumadin for 5 days but Lovenox bridge.  [facility].  I recommended a repeat echocardiogram in the next few months, however this does not need to be done prior to his surgery.  He can see Dr. CBurt Knackin 6 months.    Past Medical History:  Diagnosis Date  . Ascending aortic aneurysm and dissection    2006 repaired  7 cm aneurysm  . Chest pain, atypical   . Coronary artery reimplantation   . Hyperlipidemia    Mixed  . Hypertension    Unspecified  . Nuclear stress test    Myoview 08/2018:  EF 58, no scar or ischemia; Low Risk  . Obesity   . Psoriasis   . S/P aortic valve and root replacement    HX of St. Jude  . Seizures ([place]    one time incident 2018  . Sleep apnea, obstructive     Past Surgical History:  Procedure Laterality Date  . Ascending aortic dissection aneurysm  12/06/2004   ok for MRI 1.5 or 3T Using a 27-mm St. Jude mechanical valve conduit with reimplantation of both coronary arteries  . CORNEAL TRANSPLANT     R   . KNEE SURGERY     R  . Replacement of aortic valve  12/06/2004    Current Medications: Current Meds  Medication Sig  . acetaminophen (TYLENOL) 325 MG tablet Take 2 tablets (650 mg total) by mouth every 6 (six) hours as needed.  .Marland Kitchenatorvastatin (LIPITOR) 20 MG tablet Take 1 tablet (  20 mg total) by mouth daily.  . Cholecalciferol (VITAMIN D) 50 MCG (2000 UT) tablet Take 1 tablet (2,000 Units total) by mouth 2 (two) times daily.  . Ferrous Sulfate (IRON) 325 (65 Fe) MG TABS Take 1 tablet by mouth once daily  . furosemide (LASIX) 40 MG tablet Take 1 tablet (40 mg total) by mouth 2 (two) times daily.  . metoprolol succinate (TOPROL-XL) 50 MG 24 hr tablet Take 1 tablet (50 mg total) by mouth daily.  . Multiple Vitamins-Minerals (CENTRUM SILVER ADULT 50+) TABS 1 qd  . NARCAN 4 MG/0.1ML LIQD nasal spray kit 1 spray once.  Derrill Memo ON 05/06/2020] oxyCODONE-acetaminophen (PERCOCET) 10-325 MG tablet Take 1 tablet by mouth every 6 (six) hours as  needed for pain. Must last 30 days.  . potassium chloride (MICRO-K) 10 MEQ CR capsule Take 1 capsule (10 mEq total) by mouth daily.  . pregabalin (LYRICA) 75 MG capsule Take 1 capsule (75 mg total) by mouth 2 (two) times daily.  . vitamin C (ASCORBIC ACID) 500 MG tablet Take 1,000 mg by mouth daily.   Marland Kitchen warfarin (COUMADIN) 5 MG tablet TAKE 1.5 TABLETS DAILY OR AS DIRECTED BY COUMADIN CLINIC  . [DISCONTINUED] oxyCODONE-acetaminophen (PERCOCET) 10-325 MG tablet Take 1 tablet by mouth every 6 (six) hours as needed for pain. Must last 30 days.     Allergies:   Nsaids   Social History   Socioeconomic History  . Marital status: Married    Spouse name: Not on file  . Number of children: 0  . Years of education: 23  . Highest education level: Not on file  Occupational History  . Occupation: MIXER/PACKER    Employer: [employer]    Comment: works at [employer]- inhales vapors  Tobacco Use  . Smoking status: Former Smoker    Packs/day: 0.10    Years: 18.00    Pack years: 1.80    Types: Cigarettes    Quit date: 12/07/1989    Years since quitting: 30.3  . Smokeless tobacco: Never Used  Vaping Use  . Vaping Use: Never used  Substance and Sexual Activity  . Alcohol use: No    Comment: Quit 1991  . Drug use: No  . Sexual activity: Not on file  Other Topics Concern  . Not on file  Social History Narrative   Married   No regular exercise   Currently disabled      Patient drinks 2 cups of coffee a day    Patient is right handed.    Social Determinants of Health   Financial Resource Strain:   . Difficulty of Paying Living Expenses: Not on file  Food Insecurity:   . Worried About Charity fundraiser in the Last Year: Not on file  . Ran Out of Food in the Last Year: Not on file  Transportation Needs:   . Lack of Transportation (Medical): Not on file  . Lack of Transportation (Non-Medical): Not on file  Physical Activity:   . Days of Exercise per Week: Not on file  .  Minutes of Exercise per Session: Not on file  Stress:   . Feeling of Stress : Not on file  Social Connections:   . Frequency of Communication with Friends and Family: Not on file  . Frequency of Social Gatherings with Friends and Family: Not on file  . Attends Religious Services: Not on file  . Active Member of Clubs or Organizations: Not on file  . Attends Club  or Organization Meetings: Not on file  . Marital Status: Not on file     Family History: The patient's family history includes Alcohol abuse in his brother and father; Cancer in his brother and father; Cervical cancer in his mother and sister; Coronary artery disease in his mother; Diabetes in his father and mother; Kidney disease in an other family member; Obesity in his father.  ROS:   Please see the history of present illness.     All other systems reviewed and are negative.  EKGs/Labs/Other Studies Reviewed:    The following studies were reviewed today:  Echo 01/20/2018 LV EF: 60% -  65%   -------------------------------------------------------------------  Indications:   I10 Essential hypertension. I48.91 New onset  atrial fibrillation.   -------------------------------------------------------------------  History:  PMH: Aortic valve replacement (28m) St. Jude  mechanical valve. Chest pain. Dyspnea. Risk factors:  Dyslipidemia.   -------------------------------------------------------------------  Study Conclusions   - HPI and indications: Aortic valve replacement (256m St. Jude  mechanical valve.  - Left ventricle: The cavity size was normal. There was severe  concentric hypertrophy. Systolic function was normal. The  estimated ejection fraction was in the range of 60% to 65%. Wall  motion was normal; there were no regional wall motion  abnormalities. Doppler parameters are consistent with abnormal  left ventricular relaxation (grade 1 diastolic dysfunction).  - Aortic valve: A  mechanical prosthesis was present and functioning  normally. Peak velocity (S): 331 cm/s. Mean gradient (S): 21 mm  Hg. Valve area (VTI): 1.26 cm^2. Valve area (Vmax): 1.08 cm^2.  Valve area (Vmean): 1.05 cm^2.  - Left atrium: The atrium was moderately dilated.  - Right atrium: The atrium was severely dilated.  - Tricuspid valve: There was trivial regurgitation.  - Pulmonary arteries: Systolic pressure was mildly increased. PA  peak pressure: 32 mm Hg (S).   EKG:  EKG is ordered today.  The ekg ordered today demonstrates sinus rhythm, with occasional PACs.  Recent Labs: 03/01/2020: ALT 23; BUN 12; Creatinine, Ser 0.99; Potassium 3.7; Sodium 143  Recent Lipid Panel    Component Value Date/Time   CHOL 171 03/01/2020 1102   TRIG 139 03/01/2020 1102   HDL 41 03/01/2020 1102   CHOLHDL 4.2 03/01/2020 1102   LDLCALC 105 (H) 03/01/2020 1102     Risk Assessment/Calculations:       Physical Exam:    VS:  BP 127/61   Pulse 70   Ht 6' 2"  (1.88 m)   Wt (!) 316 lb 9.6 oz (143.6 kg)   SpO2 98%   BMI 40.65 kg/m     Wt Readings from Last 3 Encounters:  04/19/20 (!) 316 lb 9.6 oz (143.6 kg)  04/04/20 (!) 327 lb 9.6 oz (148.6 kg)  03/28/20 (!) 323 lb 4.8 oz (146.6 kg)     GEN:  Well nourished, well developed in no acute distress HEENT: Normal NECK: No JVD; No carotid bruits LYMPHATICS: No lymphadenopathy CARDIAC: RRR, no murmurs, rubs, gallops RESPIRATORY:  Clear to auscultation without rales, wheezing or rhonchi  ABDOMEN: Soft, non-tender, non-distended MUSCULOSKELETAL:  No edema; No deformity  SKIN: Warm and dry NEUROLOGIC:  Alert and oriented x 3 PSYCHIATRIC:  Normal affect   ASSESSMENT:    1. Pre-procedural examination   2. History of mechanical aortic valve replacement   3. PAF (paroxysmal atrial fibrillation) ([facility]  4. Chronic diastolic heart failure ([facility]  5. Essential hypertension   6. Hyperlipidemia LDL goal <70    PLAN:    In  order of problems  listed above:  1. Preoperative clearance: Upcoming venous ablation procedure by Dr. Scot Dock.  He denies any recent chest pain or shortness of breath.  During the previous aortic valve replacement, he had coronary artery reimplantation.  Given lack of chest discomfort, he may proceed with venous ablation after holding Coumadin for 5 days.  He would need Lovenox bridging, this will be arranged by Coumadin clinic this afternoon.  2. History of mechanical aortic valve replacement: Patient will need a repeat echocardiogram, however this does not need to be done prior to the upcoming procedure  3. PAF: Maintaining sinus rhythm on metoprolol and Coumadin.  4. Chronic diastolic heart failure: Euvolemic  5. Hypertension: Blood pressure stable  6. Hyperlipidemia: On Lipitor    Shared Decision Making/Informed Consent      Medication Adjustments/Labs and Tests Ordered: Current medicines are reviewed at length with the patient today.  Concerns regarding medicines are outlined above.  Orders Placed This Encounter  Procedures  . EKG 12-Lead  . ECHOCARDIOGRAM COMPLETE   No orders of the defined types were placed in this encounter.   Patient Instructions  Medication Instructions:   Your physician recommends that you continue on your current medications as directed. Please refer to the Current Medication list given to you today.  *If you need a refill on your cardiac medications before your next appointment, please call your pharmacy*  Lab Work: NONE ordered at this time of appointment   If you have labs (blood work) drawn today and your tests are completely normal, you will receive your results only by: Marland Kitchen MyChart Message (if you have MyChart) OR . A paper copy in the mail If you have any lab test that is abnormal or we need to change your treatment, we will call you to review the results.  Testing/Procedures: Your physician has requested that you have an echocardiogram. Echocardiography  is a painless test that uses sound waves to create images of your heart. It provides your doctor with information about the size and shape of your heart and how well your heart's chambers and valves are working. This procedure takes approximately one hour. There are no restrictions for this procedure.   Please schedule for 1-2 months    Follow-Up: At [facility], you and your health needs are our priority.  As part of our continuing mission to provide you with exceptional heart care, we have created designated Provider Care Teams.  These Care Teams include your primary Cardiologist (physician) and Advanced Practice Providers (APPs -  Physician Assistants and Nurse Practitioners) who all work together to provide you with the care you need, when you need it.  Your next appointment:   6 month(s)  The format for your next appointment:   In Person  Provider:   Sherren Mocha, MD  Other Instructions      Signed, Almyra Deforest, [place]  04/21/2020 8:53 PM    [place]

## 2020-04-19 NOTE — Patient Instructions (Addendum)
Description   Continue taking 1.5 tablets daily. Follow instructions for upcoming procedure. Recheck INR in 1 week after procedure. Call us when your procedure date is set [phone] & Fax number is [phone]     04/19/2020: Last dose of warfarin.  04/20/2020: No warfarin or enoxaparin (Lovenox).  04/21/2020: Inject enoxaparin 150mg  in the fatty abdominal tissue at least 2 inches from the belly button twice a day about 12 hours apart, 8am and 8pm rotate sites. No warfarin.  04/22/2020: Inject enoxaparin in the fatty tissue every 12 hours, 8am and 8pm. No warfarin.  04/23/2020: Inject enoxaparin in the fatty tissue every 12 hours, 8am and 8pm. No warfarin.  04/24/2020: Inject enoxaparin in the fatty tissue in the morning at 8 am (No PM dose). No warfarin.  04/25/2020: Procedure Day - No enoxaparin - Resume warfarin in the evening or as directed by doctor (take an extra half tablet with usual dose for 2 days then resume normal dose).  04/26/2020: Resume enoxaparin inject in the fatty tissue every 12 hours and take warfarin  04/27/2020: Inject enoxaparin in the fatty tissue every 12 hours and take warfarin  04/28/2020: Inject enoxaparin in the fatty tissue every 12 hours and take warfarin  04/29/2020: Inject enoxaparin in the fatty tissue every 12 hours and take warfarin  04/30/2020: Inject enoxaparin in the fatty tissue every in the morning and report to Anticoagulation Appt.

## 2020-04-19 NOTE — Telephone Encounter (Signed)
Copied from CRM [id number]. Topic: Referral - Status >> Apr 19, 2020  9:21 AM Marylen Ponto wrote: Reason for CRM: Jasmine December with [facility] stated patient has been dismissed for [employer]. Cb#  [phone]

## 2020-04-19 NOTE — Patient Instructions (Signed)
Medication Instructions:   Your physician recommends that you continue on your current medications as directed. Please refer to the Current Medication list given to you today.  *If you need a refill on your cardiac medications before your next appointment, please call your pharmacy*  Lab Work: NONE ordered at this time of appointment   If you have labs (blood work) drawn today and your tests are completely normal, you will receive your results only by: Marland Kitchen MyChart Message (if you have MyChart) OR . A paper copy in the mail If you have any lab test that is abnormal or we need to change your treatment, we will call you to review the results.  Testing/Procedures: Your physician has requested that you have an echocardiogram. Echocardiography is a painless test that uses sound waves to create images of your heart. It provides your doctor with information about the size and shape of your heart and how well your heart's chambers and valves are working. This procedure takes approximately one hour. There are no restrictions for this procedure.   Please schedule for 1-2 months    Follow-Up: At [facility], you and your health needs are our priority.  As part of our continuing mission to provide you with exceptional heart care, we have created designated Provider Care Teams.  These Care Teams include your primary Cardiologist (physician) and Advanced Practice Providers (APPs -  Physician Assistants and Nurse Practitioners) who all work together to provide you with the care you need, when you need it.  Your next appointment:   6 month(s)  The format for your next appointment:   In Person  Provider:   Tonny Bollman, MD  Other Instructions

## 2020-04-21 ENCOUNTER — Encounter: Payer: Self-pay | Admitting: Physician Assistant

## 2020-04-25 ENCOUNTER — Encounter: Payer: Self-pay | Admitting: Vascular Surgery

## 2020-04-25 ENCOUNTER — Other Ambulatory Visit: Payer: Self-pay

## 2020-04-25 ENCOUNTER — Ambulatory Visit (INDEPENDENT_AMBULATORY_CARE_PROVIDER_SITE_OTHER): Payer: 59 | Admitting: Vascular Surgery

## 2020-04-25 VITALS — BP 138/66 | HR 61 | Temp 97.2°F | Resp 18 | Ht 74.0 in | Wt 318.0 lb

## 2020-04-25 DIAGNOSIS — I83811 Varicose veins of right lower extremities with pain: Secondary | ICD-10-CM

## 2020-04-25 HISTORY — PX: ENDOVENOUS ABLATION SAPHENOUS VEIN W/ LASER: SUR449

## 2020-04-25 NOTE — Progress Notes (Signed)
     Laser Ablation Procedure    Date: 04/25/2020   Birdie Sons Blackwell DOB:[birth year]  Consent signed: Yes      Surgeon: Fabienne Bruns MD  Procedure: Laser Ablation: right Small Saphenous Vein  BP 138/66 (BP Location: Left Arm, Patient Position: Sitting, Cuff Size: Large)   Pulse 61   Temp (!) 97.2 F (36.2 C) (Temporal)   Resp 18   Ht 6\' 2"  (1.88 m)   Wt (!) 318 lb (144.2 kg)   SpO2 99%   BMI 40.83 kg/m   Tumescent Anesthesia: 325 cc 0.9% NaCl with 50 cc Lidocaine HCL 1%  and 15 cc 8.4% NaHCO3  Local Anesthesia: 3 cc Lidocaine HCL and NaHCO3 (ratio 2:1)  7 watts continuous mode     Total energy: 626 Joules    Total time: 89 seconds Treatment Length 14 cm  Laser Fiber Ref. #  Lot# [id number]                           Patient tolerated procedure well  Notes: Patient wore face mask.  All staff members wore facial masks and facial shields/goggles.  Last dose of Coumadin was 04-19-2020.  Mr. Hain took ativan 1 mg two tablets on 04-25-2020 at 7:50 AM.    Description of Procedure:  After marking the course of the secondary varicosities, the patient was placed on the operating table in the prone position, and the right leg was prepped and draped in sterile fashion.   Local anesthetic was administered and under ultrasound guidance the saphenous vein was accessed with a micro needle and guide wire; then the mirco puncture sheath was placed.  A guide wire was inserted saphenopopliteal junction , followed by a 5 french sheath.  The position of the sheath and then the laser fiber below the junction was confirmed using the ultrasound.  Tumescent anesthesia was administered along the course of the saphenous vein using ultrasound guidance. The patient was placed in Trendelenburg position and protective laser glasses were placed on patient and staff, and the laser was fired at 7 watts continuous mode for a total of 626 joules.  Steri strip was applied to the IV insertion site  and ABD pads and thigh high compression stockings were applied.  Ace wrap bandages were applied over the right calf and thigh  and at the top of the saphenopopliteal junction. Blood loss was less than 15 cc.  Discharge instructions reviewed with patient and hardcopy of discharge instructions given to patient to take home. The patient ambulated out of the operating room having tolerated the procedure well.   04-27-2020, MD [facility] Office: [phone]

## 2020-04-26 ENCOUNTER — Other Ambulatory Visit: Payer: Self-pay | Admitting: *Deleted

## 2020-04-26 DIAGNOSIS — I83813 Varicose veins of bilateral lower extremities with pain: Secondary | ICD-10-CM

## 2020-04-30 ENCOUNTER — Ambulatory Visit (INDEPENDENT_AMBULATORY_CARE_PROVIDER_SITE_OTHER): Payer: 59

## 2020-04-30 ENCOUNTER — Other Ambulatory Visit: Payer: Self-pay

## 2020-04-30 DIAGNOSIS — Z952 Presence of prosthetic heart valve: Secondary | ICD-10-CM

## 2020-04-30 DIAGNOSIS — Z5181 Encounter for therapeutic drug level monitoring: Secondary | ICD-10-CM

## 2020-04-30 DIAGNOSIS — I359 Nonrheumatic aortic valve disorder, unspecified: Secondary | ICD-10-CM

## 2020-04-30 DIAGNOSIS — Z7901 Long term (current) use of anticoagulants: Secondary | ICD-10-CM

## 2020-04-30 LAB — POCT INR: INR: 1.7 — AB (ref 2.0–3.0)

## 2020-04-30 NOTE — Patient Instructions (Signed)
Description   Take 2 tablets today and tomorrow, then resume same dosage 1.5 tablets daily. Continue on Lovenox twice daily, can discontinue Lovenox on Thursday 05/03/20.  Recheck as soon as you get back in town. Call us when your procedure date is set [phone] & Fax number is [phone]

## 2020-05-09 ENCOUNTER — Other Ambulatory Visit: Payer: Self-pay

## 2020-05-09 ENCOUNTER — Ambulatory Visit (INDEPENDENT_AMBULATORY_CARE_PROVIDER_SITE_OTHER): Payer: Self-pay | Admitting: Vascular Surgery

## 2020-05-09 ENCOUNTER — Ambulatory Visit (HOSPITAL_COMMUNITY)
Admission: RE | Admit: 2020-05-09 | Discharge: 2020-05-09 | Disposition: A | Discharge status: Home / Self Care | Payer: 59 | Source: Ambulatory Visit | Attending: Vascular Surgery | Admitting: Vascular Surgery

## 2020-05-09 ENCOUNTER — Encounter: Payer: Self-pay | Admitting: Vascular Surgery

## 2020-05-09 VITALS — BP 131/73 | HR 62 | Temp 97.5°F | Resp 18 | Ht 74.0 in | Wt 318.0 lb

## 2020-05-09 DIAGNOSIS — I83813 Varicose veins of bilateral lower extremities with pain: Secondary | ICD-10-CM | POA: Diagnosis not present

## 2020-05-09 NOTE — Progress Notes (Signed)
Patient is a 59 year old male who returns for follow-up today.  He underwent laser ablation of his right lesser saphenous vein on April 25, 2020.  He reports no significant pain.  He has had no drainage.  He has had no shortness of breath or chest pain.  He is tentatively scheduled to have the contralateral leg done on December 15.  He has had several episodes of bleeding from his right leg in the past which was his primary indication for the procedure.  He has not had any further bleeding episodes.  Physical exam:  Vitals:   05/09/20 1025  BP: 131/73  Pulse: 62  Resp: 18  Temp: (!) 97.5 F (36.4 C)  TempSrc: Temporal  SpO2: 98%  Weight: (!) 318 lb (144.2 kg)  Height: 6\' 2"  (1.88 m)    Right lower extremity: No significant edema well-healed puncture site  Assessment: Doing well status post laser ablation right lesser saphenous vein.  He is scheduled for the left lesser saphenous vein on December 15.  December 17, MD [facility] [place] Office: [phone]

## 2020-05-11 ENCOUNTER — Ambulatory Visit (INDEPENDENT_AMBULATORY_CARE_PROVIDER_SITE_OTHER): Payer: 59 | Admitting: *Deleted

## 2020-05-11 ENCOUNTER — Other Ambulatory Visit: Payer: Self-pay

## 2020-05-11 DIAGNOSIS — Z7901 Long term (current) use of anticoagulants: Secondary | ICD-10-CM | POA: Diagnosis not present

## 2020-05-11 DIAGNOSIS — Z5181 Encounter for therapeutic drug level monitoring: Secondary | ICD-10-CM | POA: Diagnosis not present

## 2020-05-11 DIAGNOSIS — I359 Nonrheumatic aortic valve disorder, unspecified: Secondary | ICD-10-CM

## 2020-05-11 DIAGNOSIS — Z952 Presence of prosthetic heart valve: Secondary | ICD-10-CM

## 2020-05-11 LAB — POCT INR: INR: 3.2 — AB (ref 2.0–3.0)

## 2020-05-11 MED ORDER — ENOXAPARIN SODIUM 150 MG/ML ~~LOC~~ SOLN: 150.0000 mg | Freq: Two times a day (BID) | SUBCUTANEOUS | 1 refills | Status: DC

## 2020-05-11 NOTE — Patient Instructions (Addendum)
Description    Continue taking warfarin 1.5 tablets daily. Follow bridge instructions. Coumadin Clinic [phone] & Fax number is [phone]     12/9: Last dose of warfarin.  12/10: No warfarin or enoxaparin (Lovenox).  12/11: Inject enoxaparin 150 mg in the fatty abdominal tissue at least 2 inches from the belly button twice a day about 12 hours apart, 8am and 8pm rotate sites. No warfarin.  12/12: Inject enoxaparin in the fatty tissue every 12 hours, 8am and 8pm. No warfarin.  12/13: Inject enoxaparin in the fatty tissue every 12 hours, 8am and 8pm. No warfarin.  12/14: Inject enoxaparin in the fatty tissue in the morning at 8 am,  no pm dose.  No warfarin.  12/15: Procedure Day - No enoxaparin - Resume warfarin in the evening or as directed by doctor (take an extra half tablet with usual dose for 2 days then resume normal dose).  12/16: Resume enoxaparin inject in the fatty tissue every 12 hours and take warfarin  12/17: Inject enoxaparin in the fatty tissue every 12 hours and take warfarin  12/18: Inject enoxaparin in the fatty tissue every 12 hours and take warfarin  12/19: Inject enoxaparin in the fatty tissue every 12 hours and take warfarin  12/20: Inject enoxaparin in the fatty tissue every 12 hours and take warfarin  12/21: warfarin appt to check INR.

## 2020-05-15 ENCOUNTER — Other Ambulatory Visit: Payer: Self-pay | Admitting: *Deleted

## 2020-05-15 MED ORDER — LORAZEPAM 1 MG PO TABS: ORAL_TABLET | ORAL | 0 refills | Status: DC

## 2020-05-16 ENCOUNTER — Encounter: Payer: Self-pay | Admitting: Family Medicine

## 2020-05-16 ENCOUNTER — Telehealth (INDEPENDENT_AMBULATORY_CARE_PROVIDER_SITE_OTHER): Payer: 59 | Admitting: Family Medicine

## 2020-05-16 ENCOUNTER — Other Ambulatory Visit: Payer: Self-pay

## 2020-05-16 DIAGNOSIS — R197 Diarrhea, unspecified: Secondary | ICD-10-CM

## 2020-05-16 DIAGNOSIS — R059 Cough, unspecified: Secondary | ICD-10-CM | POA: Diagnosis not present

## 2020-05-16 DIAGNOSIS — R0981 Nasal congestion: Secondary | ICD-10-CM | POA: Diagnosis not present

## 2020-05-16 MED ORDER — CORICIDIN HBP COUGH/COLD 4-30 MG PO TABS: 1.0000 | ORAL_TABLET | Freq: Two times a day (BID) | ORAL | 0 refills | Status: DC | PRN

## 2020-05-16 MED ORDER — MUCINEX DM MAXIMUM STRENGTH 60-1200 MG PO TB12: 1.0000 | ORAL_TABLET | Freq: Two times a day (BID) | ORAL | 0 refills | Status: AC

## 2020-05-16 NOTE — Patient Instructions (Signed)
Viral Respiratory Infection A viral respiratory infection is an illness that affects parts of the body that are used for breathing. These include the lungs, nose, and throat. It is caused by a germ called a virus. Some examples of this kind of infection are:  A cold.  The flu (influenza).  A respiratory syncytial virus (RSV) infection. A person who gets this illness may have the following symptoms:  A stuffy or runny nose.  Yellow or green fluid in the nose.  A cough.  Sneezing.  Tiredness (fatigue).  Achy muscles.  A sore throat.  Sweating or chills.  A fever.  A headache. Follow these instructions at home: Managing pain and congestion  Take over-the-counter and prescription medicines only as told by your doctor.  If you have a sore throat, gargle with salt water. Do this 3-4 times per day or as needed. To make a salt-water mixture, dissolve -1 tsp of salt in 1 cup of warm water. Make sure that all the salt dissolves.  Use nose drops made from salt water. This helps with stuffiness (congestion). It also helps soften the skin around your nose.  Drink enough fluid to keep your pee (urine) pale yellow. General instructions   Rest as much as possible.  Do not drink alcohol.  Do not use any products that have nicotine or tobacco, such as cigarettes and e-cigarettes. If you need help quitting, ask your doctor.  Keep all follow-up visits as told by your doctor. This is important. How is this prevented?   Get a flu shot every year. Ask your doctor when you should get your flu shot.  Do not let other people get your germs. If you are sick: ? Stay home from work or school. ? Wash your hands with soap and water often. Wash your hands after you cough or sneeze. If soap and water are not available, use hand sanitizer.  Avoid contact with people who are sick during cold and flu season. This is in fall and winter. Get help if:  Your symptoms last for 10 days or  longer.  Your symptoms get worse over time.  You have a fever.  You have very bad pain in your face or forehead.  Parts of your jaw or neck become very swollen. Get help right away if:  You feel pain or pressure in your chest.  You have shortness of breath.  You faint or feel like you will faint.  You keep throwing up (vomiting).  You feel confused. Summary  A viral respiratory infection is an illness that affects parts of the body that are used for breathing.  Examples of this illness include a cold, the flu, and respiratory syncytial virus (RSV) infection.  The infection can cause a runny nose, cough, sneezing, sore throat, and fever.  Follow what your doctor tells you about taking medicines, drinking lots of fluid, washing your hands, resting at home, and avoiding people who are sick. This information is not intended to replace advice given to you by your health care provider. Make sure you discuss any questions you have with your health care provider. Document Revised: 06/03/2018 Document Reviewed: 07/06/2017 Elsevier Patient Education  2020 [employer]  

## 2020-05-16 NOTE — Progress Notes (Addendum)
Virtual Visit Note  I connected with patient on 05/16/20 at 1438 by Epic video visit and verified that I am speaking with the correct person using two identifiers. [name] is currently located at home and no family members are currently with them during visit. The provider, Laurita Quint Laelynn Blizzard, FNP is located in their office at time of visit.  I discussed the limitations, risks, security and privacy concerns of performing an evaluation and management service by telephone and the availability of in person appointments. I also discussed with the patient that there may be a patient responsible charge related to this service. The patient expressed understanding and agreed to proceed.   I provided 20 minutes of non-face-to-face time during this encounter.  Chief Complaint  Patient presents with  . Cough    x 3 days- mucinex, coriciden hbp   . Nasal Congestion    x 3 days- states no recent covid testing   . Diarrhea    HPI ? Great grandson had a cold Congestion and cough Diarrhea this morning Has been taking mucinex, theraflu x2  Has been sleeping well Congestion is the worst symptom    Allergies  Allergen Reactions  . Nsaids Other (See Comments)    Told not to take meds-per patient    Prior to Admission medications   Medication Sig Start Date End Date Taking? Authorizing Provider  acetaminophen (TYLENOL) 325 MG tablet Take 2 tablets (650 mg total) by mouth every 6 (six) hours as needed. 05/20/18  Yes Khatri, Hina, PA-C  atorvastatin (LIPITOR) 20 MG tablet Take 1 tablet (20 mg total) by mouth daily. 01/03/20  Yes Sherren Mocha, MD  Cholecalciferol (VITAMIN D) 50 MCG (2000 UT) tablet Take 1 tablet (2,000 Units total) by mouth 2 (two) times daily. 11/16/18  Yes Isaiah Serge, NP  enoxaparin (LOVENOX) 150 MG/ML injection Inject 1 mL (150 mg total) into the skin every 12 (twelve) hours. 20 syringes 05/11/20  Yes Sherren Mocha, MD  Ferrous Sulfate (IRON) 325 (65 Fe) MG TABS  Take 1 tablet by mouth once daily 02/16/19  Yes Isaiah Serge, NP  furosemide (LASIX) 40 MG tablet Take 1 tablet (40 mg total) by mouth 2 (two) times daily. 04/06/20  Yes Maximiano Coss, NP  LORazepam (ATIVAN) 1 MG tablet Take 2 tablets 30 minutes prior to leaving house on day of office surgery. 04/19/20  Yes Fields, Jessy Oto, MD  LORazepam (ATIVAN) 1 MG tablet Take 1 tablet 30 minutes prior to leaving house on day of procedure and bring second tablet with you to the office. 05/15/20  Yes Fields, Jessy Oto, MD  metoprolol succinate (TOPROL-XL) 50 MG 24 hr tablet Take 1 tablet (50 mg total) by mouth daily. 02/17/20  Yes Rutherford Guys, MD  Multiple Vitamins-Minerals (CENTRUM SILVER ADULT 50+) TABS 1 qd   Yes [provider]  NARCAN 4 MG/0.1ML LIQD nasal spray kit 1 spray once. 02/06/20  Yes [provider]  oxyCODONE-acetaminophen (PERCOCET) 10-325 MG tablet Take 1 tablet by mouth every 6 (six) hours as needed for pain. Must last 30 days. 05/06/20 06/05/20 Yes Gillis Santa, MD  potassium chloride (MICRO-K) 10 MEQ CR capsule Take 1 capsule (10 mEq total) by mouth daily. 04/03/20 03/29/21 Yes Maximiano Coss, NP  pregabalin (LYRICA) 75 MG capsule Take 1 capsule (75 mg total) by mouth 2 (two) times daily. 04/17/20 05/17/20 Yes Brieonna Crutcher, Laurita Quint, FNP  vitamin C (ASCORBIC ACID) 500 MG tablet Take 1,000 mg by mouth daily.  Yes [provider]  warfarin (COUMADIN) 5 MG tablet TAKE 1.5 TABLETS DAILY OR AS DIRECTED BY COUMADIN CLINIC 04/02/20  Yes Sherren Mocha, MD    Past Medical History:  Diagnosis Date  . Ascending aortic aneurysm and dissection    2006 repaired  7 cm aneurysm  . Chest pain, atypical   . Coronary artery reimplantation   . Hyperlipidemia    Mixed  . Hypertension    Unspecified  . Nuclear stress test    Myoview 08/2018:  EF 58, no scar or ischemia; Low Risk  . Obesity   . Psoriasis   . S/P aortic valve and root replacement    HX of [employer] Jude  . Seizures  ([place])    one time incident 2018  . Sleep apnea, obstructive     Past Surgical History:  Procedure Laterality Date  . Ascending aortic dissection aneurysm  12/06/2004   ok for MRI 1.5 or 3T Using a 27-mm [employer] mechanical valve conduit with reimplantation of both coronary arteries  . CORNEAL TRANSPLANT     R   . ENDOVENOUS ABLATION SAPHENOUS VEIN W/ LASER Right 04/25/2020   endovenous laser ablation right small saphenous vein by Ruta Hinds MD   . KNEE SURGERY     R  . Replacement of aortic valve  12/06/2004    Social History   Tobacco Use  . Smoking status: Former Smoker    Packs/day: 0.10    Years: 18.00    Pack years: 1.80    Types: Cigarettes    Quit date: 12/07/1989    Years since quitting: 30.4  . Smokeless tobacco: Never Used  Substance Use Topics  . Alcohol use: No    Comment: Quit 1991    Family History  Problem Relation Age of Onset  . Cervical cancer Mother   . Coronary artery disease Mother   . Diabetes Mother   . Diabetes Father   . Cancer Father        lung cancer   . Obesity Father   . Alcohol abuse Father   . Cervical cancer Sister   . Cancer Brother        lung cancer  . Alcohol abuse Brother   . Kidney disease Other        dialysis    Review of Systems  Constitutional: Negative for chills, fever and malaise/fatigue.  HENT: Positive for congestion. Negative for ear pain, sinus pain and sore throat.   Eyes: Negative for discharge and redness.  Respiratory: Positive for cough. Negative for sputum production, shortness of breath and wheezing.   Cardiovascular: Negative for chest pain.  Gastrointestinal: Positive for diarrhea.  Musculoskeletal: Negative for back pain.  Neurological: Negative for headaches.    Objective  Constitutional:      General: She is not in acute distress.    Appearance: Normal appearance. She is not ill-appearing.   Pulmonary:     Effort: Pulmonary effort is normal. No respiratory distress.  Neurological:      Mental Status: She is alert and oriented to person, place, and time.  Psychiatric:        Mood and Affect: Mood normal.        Behavior: Behavior normal.     ASSESSMENT and PLAN  Problem List Items Addressed This Visit      Other   Diarrhea    Other Visit Diagnoses    Cough    -  Primary   Nasal congestion  Relevant Medications   Dextromethorphan-guaiFENesin (MUCINEX DM MAXIMUM STRENGTH) 60-1200 MG TB12   Chlorpheniramine-DM (CORICIDIN COUGH/COLD) 4-30 MG TABS  R/se/b of medications discussed RTC precautions provided Discussed conservative management:  sore throat, gargle with salt water. Do this 3-4 times per day or as needed. To make a salt-water mixture, dissolve -1 tsp of salt in 1 cup of warm water. Make sure that all the salt dissolves.  Use nose drops made from salt water. This helps with stuffiness (congestion). It also helps soften the skin around your nose.  Drink enough fluid to keep your pee (urine) pale yellow.  Humidifier in bedroom  Rest       Return if symptoms worsen or fail to improve.    The above assessment and management plan was discussed with the patient. The patient verbalized understanding of and has agreed to the management plan. Patient is aware to call the clinic if symptoms persist or worsen. Patient is aware when to return to the clinic for a follow-up visit. Patient educated on when it is appropriate to go to the emergency department.     Huston Foley Sitlaly Gudiel, FNP-BC [facility] [place], [place] [address] Ph.  [phone] Fax [phone]

## 2020-05-22 ENCOUNTER — Other Ambulatory Visit: Payer: Self-pay

## 2020-05-22 ENCOUNTER — Encounter: Payer: Self-pay | Admitting: Student in an Organized Health Care Education/Training Program

## 2020-05-22 ENCOUNTER — Telehealth: Payer: Self-pay | Admitting: Family Medicine

## 2020-05-22 ENCOUNTER — Ambulatory Visit
Payer: 59 | Attending: Student in an Organized Health Care Education/Training Program | Admitting: Student in an Organized Health Care Education/Training Program

## 2020-05-22 VITALS — BP 137/71 | HR 54 | Temp 97.4°F | Resp 18 | Ht 74.0 in | Wt 311.0 lb

## 2020-05-22 DIAGNOSIS — M5416 Radiculopathy, lumbar region: Secondary | ICD-10-CM | POA: Diagnosis not present

## 2020-05-22 DIAGNOSIS — M1712 Unilateral primary osteoarthritis, left knee: Secondary | ICD-10-CM | POA: Diagnosis present

## 2020-05-22 DIAGNOSIS — G589 Mononeuropathy, unspecified: Secondary | ICD-10-CM | POA: Insufficient documentation

## 2020-05-22 DIAGNOSIS — M5136 Other intervertebral disc degeneration, lumbar region: Secondary | ICD-10-CM | POA: Insufficient documentation

## 2020-05-22 DIAGNOSIS — M47816 Spondylosis without myelopathy or radiculopathy, lumbar region: Secondary | ICD-10-CM | POA: Diagnosis not present

## 2020-05-22 DIAGNOSIS — M1612 Unilateral primary osteoarthritis, left hip: Secondary | ICD-10-CM

## 2020-05-22 DIAGNOSIS — M1711 Unilateral primary osteoarthritis, right knee: Secondary | ICD-10-CM | POA: Diagnosis not present

## 2020-05-22 DIAGNOSIS — G894 Chronic pain syndrome: Secondary | ICD-10-CM | POA: Diagnosis not present

## 2020-05-22 MED ORDER — OXYCODONE-ACETAMINOPHEN 10-325 MG PO TABS: 1.0000 | ORAL_TABLET | Freq: Four times a day (QID) | ORAL | 0 refills | Status: DC | PRN

## 2020-05-22 MED ORDER — PREGABALIN 75 MG PO CAPS: 75.0000 mg | ORAL_CAPSULE | Freq: Two times a day (BID) | ORAL | 3 refills | Status: DC

## 2020-05-22 NOTE — Patient Instructions (Signed)
____________________________________________________________________________________________  Medication Rules  Applies to: All patients receiving prescriptions (written or electronic).  Pharmacy of record: Pharmacy where electronic prescriptions will be sent. If written prescriptions are taken to a different pharmacy, please inform the nursing staff. The pharmacy listed in the electronic medical record should be the one where you would like electronic prescriptions to be sent.  Prescription refills: Only during scheduled appointments. Applies to both, written and electronic prescriptions.  NOTE: The following applies primarily to controlled substances (Opioid* Pain Medications).   Patient's responsibilities: 1. Pain Pills: Bring all pain pills to every appointment (except for procedure appointments). 2. Pill Bottles: Bring pills in original pharmacy bottle. Always bring newest bottle. Bring bottle, even if empty. 3. Medication refills: You are responsible for knowing and keeping track of what medications you need refilled. The day before your appointment, write a list of all prescriptions that need to be refilled. Bring that list to your appointment and give it to the admitting nurse. Prescriptions will be written only during appointments. If you forget a medication, it will not be "Called in", "Faxed", or "electronically sent". You will need to get another appointment to get these prescribed. 4. Prescription Accuracy: You are responsible for carefully inspecting your prescriptions before leaving our office. Have the discharge nurse carefully go over each prescription with you, before taking them home. Make sure that your name is accurately spelled, that your address is correct. Check the name and dose of your medication to make sure it is accurate. Check the number of pills, and the written instructions to make sure they are clear and accurate. Make sure that you are given enough medication to last  until your next medication refill appointment. 5. Taking Medication: Take medication as prescribed. Never take more pills than instructed. Never take medication more frequently than prescribed. Taking less pills or less frequently is permitted and encouraged, when it comes to controlled substances (written prescriptions).  6. Inform other Doctors: Always inform, all of your healthcare providers, of all the medications you take. 7. Pain Medication from other Providers: You are not allowed to accept any additional pain medication from any other Doctor or Healthcare provider. There are two exceptions to this rule. (see below) In the event that you require additional pain medication, you are responsible for notifying us, as stated below. 8. Medication Agreement: You are responsible for carefully reading and following our Medication Agreement. This must be signed before receiving any prescriptions from our practice. Safely store a copy of your signed Agreement. Violations to the Agreement will result in no further prescriptions. (Additional copies of our Medication Agreement are available upon request.) 9. Laws, Rules, & Regulations: All patients are expected to follow all Federal and State Laws, Statutes, Rules, & Regulations. Ignorance of the Laws does not constitute a valid excuse. The use of any illegal substances is prohibited. 10. Adopted CDC guidelines & recommendations: Target dosing levels will be at or below 60 MME/day. Use of benzodiazepines** is not recommended.  Exceptions: There are only two exceptions to the rule of not receiving pain medications from other Healthcare Providers. 1. Exception #1 (Emergencies): In the event of an emergency (i.e.: accident requiring emergency care), you are allowed to receive additional pain medication. However, you are responsible for: As soon as you are able, call our office [phone], at any time of the day or night, and leave a message stating your name, the  date and nature of the emergency, and the name and dose of the medication   prescribed. In the event that your call is answered by a member of our staff, make sure to document and save the date, time, and the name of the person that took your information.  2. Exception #2 (Planned Surgery): In the event that you are scheduled by another doctor or dentist to have any type of surgery or procedure, you are allowed (for a period no longer than 30 days), to receive additional pain medication, for the acute post-op pain. However, in this case, you are responsible for picking up a copy of our "Post-op Pain Management for Surgeons" handout, and giving it to your surgeon or dentist. This document is available at our office, and does not require an appointment to obtain it. Simply go to our office during business hours (Monday-Thursday from 8:00 AM to 4:00 PM) (Friday 8:00 AM to 12:00 Noon) or if you have a scheduled appointment with us, prior to your surgery, and ask for it by name. In addition, you will need to provide us with your name, name of your surgeon, type of surgery, and date of procedure or surgery.  *Opioid medications include: morphine, codeine, oxycodone, oxymorphone, hydrocodone, hydromorphone, meperidine, tramadol, tapentadol, buprenorphine, fentanyl, methadone. **Benzodiazepine medications include: diazepam (Valium), alprazolam (Xanax), clonazepam (Klonopine), lorazepam (Ativan), clorazepate (Tranxene), chlordiazepoxide (Librium), estazolam (Prosom), oxazepam (Serax), temazepam (Restoril), triazolam (Halcion) (Last updated: 08/06/2017) ____________________________________________________________________________________________    

## 2020-05-22 NOTE — Telephone Encounter (Signed)
Pt is calling to request that Macario Carls Just needs to ffax a letter  a letter to [facility] (Pain Management )  Stating that she is Patients Provider and is taking over your care   I will schedule a TOC for patient

## 2020-05-22 NOTE — Progress Notes (Signed)
PROVIDER NOTE: Information contained herein reflects review and annotations entered in association with encounter. Interpretation of such information and data should be left to medically-trained personnel. Information provided to patient can be located elsewhere in the medical record under "Patient Instructions". Document created using STT-dictation technology, any transcriptional errors that may result from process are unintentional.    Patient: [name]  Service Category: E/M  Provider: Gillis Santa, MD  DOB: 1960/09/24  DOS: 05/22/2020  Specialty: Interventional Pain Management  MRN: [id number]  Setting: Ambulatory outpatient  PCP: Patient, No Pcp Per  Type: Established Patient    Referring Provider: Jacelyn Pi, Lilia Argue, *  Location: Office  Delivery: Face-to-face     HPI  [name], a 59 y.o. year old male, is here today because of his Chronic pain syndrome [G89.4]. Mr. Mayse's primary complain today is Medication Refill Last encounter: My last encounter with him was on 04/03/2020. Pertinent problems: Mr. Keltner has OBESITY; Obstructive sleep apnea; Aortic valve disorder; S/P aortic valve replacement; Long term current use of anticoagulant; Chronic pain syndrome; Pain in joint, pelvic region and thigh; Pain in right hip; and Lumbar radiculopathy, chronic on their pertinent problem list. Pain Assessment: Severity of Chronic pain is reported as a 6 /10. Location: Back Lower,Right,Left/Radiates down to legs bilateral past knee. Onset: More than a month ago. Quality: Constant,Stabbing,Aching (pulsating). Timing: Constant. Modifying factor(s): Sitting and medication. Vitals:  height is 6' 2"  (1.88 m) and weight is 311 lb (141.1 kg) (abnormal). His temperature is 97.4 F (36.3 C) (abnormal). His blood pressure is 137/71 and his pulse is 54 (abnormal). His respiration is 18 and oxygen saturation is 99%.   Reason for encounter: medication management.   No change in medical  history since last visit.  Patient's pain is at baseline.  Patient continues multimodal pain regimen as prescribed.  States that it provides pain relief and improvement in functional status.   Pharmacotherapy Assessment   05/12/2020  02/16/2020   3  Oxycodone-Acetaminophen 10-325  120.00  30  Bi Lat  [id number]  Wal (2001)  0/0  60.00 MME  Comm Ins  [place]     Analgesic: Percocet 10 mg 4 times daily as needed, quantity 120/month MME equals 60   Monitoring: [place] PMP: PDMP reviewed during this encounter.       Pharmacotherapy: No side-effects or adverse reactions reported. Compliance: No problems identified. Effectiveness: Clinically acceptable.  Janne Napoleon, RN  05/22/2020  2:07 PM  Sign when Signing Visit Safety precautions to be maintained throughout the outpatient stay will include: orient to surroundings, keep bed in low position, maintain call bell within reach at all times, provide assistance with transfer out of bed and ambulation.   Nursing Pain Medication Assessment:  Safety precautions to be maintained throughout the outpatient stay will include: orient to surroundings, keep bed in low position, maintain call bell within reach at all times, provide assistance with transfer out of bed and ambulation.  Medication Inspection Compliance: Pill count conducted under aseptic conditions, in front of the patient. Neither the pills nor the bottle was removed from the patient's sight at any time. Once count was completed pills were immediately returned to the patient in their original bottle.  Medication: Oxycodone/APAP Pill/Patch Count: 54 of 120 pills remain Pill/Patch Appearance: Markings consistent with prescribed medication Bottle Appearance: Intact  Filled Date: 12 / 04 / 2021 Last Medication intake:  Today  Patient states he has about 13-14 pills and his medication container at  home he forgot to bring.     UDS:  Summary  Date Value Ref Range Status  09/06/2019 Note  Final     Comment:    ==================================================================== Compliance Drug Analysis, Ur ==================================================================== Test                             Result       Flag       Units Drug Present and Declared for Prescription Verification   Oxycodone                      591          EXPECTED   ng/mg creat   Oxymorphone                    1065         EXPECTED   ng/mg creat   Noroxycodone                   1752         EXPECTED   ng/mg creat   Noroxymorphone                 325          EXPECTED   ng/mg creat    Sources of oxycodone are scheduled prescription medications.    Oxymorphone, noroxycodone, and noroxymorphone are expected    metabolites of oxycodone. Oxymorphone is also available as a    scheduled prescription medication.   Pregabalin                     PRESENT      EXPECTED   Acetaminophen                  PRESENT      EXPECTED   Metoprolol                     PRESENT      EXPECTED Drug Absent but Declared for Prescription Verification   Salicylate                     Not Detected UNEXPECTED    Aspirin, as indicated in the declared medication list, is not always    detected even when used as directed. ==================================================================== Test                      Result    Flag   Units      Ref Range   Creatinine              129              mg/dL      >=20 ==================================================================== Declared Medications:  The flagging and interpretation on this report are based on the  following declared medications.  Unexpected results may arise from  inaccuracies in the declared medications.  **Note: The testing scope of this panel includes these medications:  Metoprolol (Toprol)  Oxycodone (Percocet)  Pregabalin (Lyrica)  **Note: The testing scope of this panel does not include small to  moderate amounts of these reported medications:  Acetaminophen  (Tylenol)  Acetaminophen (Percocet)  Aspirin  **Note: The testing scope of this panel does not include the  following reported medications:  Atorvastatin (Lipitor)  Furosemide (Lasix)  Iron  Multivitamin (Centrum)  Potassium (Klor-Con)  Secukinumab (Cosentyx)  Vitamin C  Vitamin D  Vitamin D3  Warfarin (Coumadin) ==================================================================== For clinical consultation, please call [phone]. ====================================================================      ROS  Constitutional: Denies any fever or chills Gastrointestinal: No reported hemesis, hematochezia, vomiting, or acute GI distress Musculoskeletal: Denies any acute onset joint swelling, redness, loss of ROM, or weakness Neurological: No reported episodes of acute onset apraxia, aphasia, dysarthria, agnosia, amnesia, paralysis, loss of coordination, or loss of consciousness  Medication Review  Centrum Silver Adult 50+, Chlorpheniramine-DM, Iron, LORazepam, Mucinex DM Maximum Strength, Vitamin D, acetaminophen, atorvastatin, enoxaparin, furosemide, metoprolol succinate, naloxone, oxyCODONE-acetaminophen, potassium chloride, pregabalin, vitamin C, and warfarin  History Review  Allergy: Mr. Raby is allergic to nsaids. Drug: Mr. Deviney  reports no history of drug use. Alcohol:  reports no history of alcohol use. Tobacco:  reports that he quit smoking about 30 years ago. His smoking use included cigarettes. He has a 1.80 pack-year smoking history. He has never used smokeless tobacco. Social: Mr. Howes  reports that he quit smoking about 30 years ago. His smoking use included cigarettes. He has a 1.80 pack-year smoking history. He has never used smokeless tobacco. He reports that he does not drink alcohol and does not use drugs. Medical:  has a past medical history of Ascending aortic aneurysm and dissection, Chest pain, atypical, Coronary artery reimplantation,  Hyperlipidemia, Hypertension, Nuclear stress test, Obesity, Psoriasis, S/P aortic valve and root replacement, Seizures ([place]), and Sleep apnea, obstructive. Surgical: Mr. Berisha  has a past surgical history that includes Replacement of aortic valve (12/06/2004); Ascending aortic dissection aneurysm (12/06/2004); Knee surgery; Corneal transplant; and Endovenous ablation saphenous vein w/ laser (Right, 04/25/2020). Family: family history includes Alcohol abuse in his brother and father; Cancer in his brother and father; Cervical cancer in his mother and sister; Coronary artery disease in his mother; Diabetes in his father and mother; Kidney disease in an other family member; Obesity in his father.  Laboratory Chemistry Profile   Renal Lab Results  Component Value Date   BUN 12 03/01/2020   CREATININE 0.99 03/01/2020   BCR 12 03/01/2020   GFRAA 95 03/01/2020   GFRNONAA 82 03/01/2020     Hepatic Lab Results  Component Value Date   AST 13 03/01/2020   ALT 23 03/01/2020   ALBUMIN 4.3 03/01/2020   ALKPHOS 105 03/01/2020   LIPASE 21 07/26/2016     Electrolytes Lab Results  Component Value Date   NA 143 03/01/2020   K 3.7 03/01/2020   CL 102 03/01/2020   CALCIUM 9.2 03/01/2020   MG 1.9 03/18/2015     Bone No results found for: VD25OH, VD125OH2TOT, FI4332RJ1, OA4166AY3, 25OHVITD1, 25OHVITD2, 25OHVITD3, TESTOFREE, TESTOSTERONE   Inflammation (CRP: Acute Phase) (ESR: Chronic Phase) No results found for: CRP, ESRSEDRATE, LATICACIDVEN     Note: Above Lab results reviewed.  Recent Imaging Review  VAS [place] LOWER EXTREMITY VENOUS POST ABLATION  Lower Venous Reflux Study  Indications: Post-ablation evaluation.  Right small saphenous vein ablation 04/25/2020   Performing Technologist: Ronal Fear RVS, RCS    Examination Guidelines: A complete evaluation includes B-mode imaging, spectral Doppler, color Doppler, and power Doppler as needed of all accessible portions of each vessel.  Bilateral testing is considered an integral part of a complete examination. Limited examinations for reoccurring indications may be performed as noted. The reflux portion of the exam is performed with the patient in reverse Trendelenburg. Significant venous reflux is defined as >500 ms in the superficial venous system, and >1 second in the  deep venous system.       Summary: Right: - No evidence of deep vein thrombosis from the common femoral through the popliteal veins. - Successful ablation of the small saphenous vein from the mid calf to within 3.3 cm from the saphenopopliteal junction.   *See table(s) above for measurements and observations.  Electronically signed by Ruta Hinds MD on 05/09/2020 at 10:35:06 AM.      Final   Note: Reviewed        Physical Exam  General appearance: Well nourished, well developed, and well hydrated. In no apparent acute distress Mental status: Alert, oriented x 3 (person, place, & time)       Respiratory: No evidence of acute respiratory distress Eyes: PERLA Vitals: BP 137/71   Pulse (!) 54   Temp (!) 97.4 F (36.3 C)   Resp 18   Ht 6' 2"  (1.88 m)   Wt (!) 311 lb (141.1 kg)   SpO2 99%   BMI 39.93 kg/m  BMI: Estimated body mass index is 39.93 kg/m as calculated from the following:   Height as of this encounter: 6' 2"  (1.88 m).   Weight as of this encounter: 311 lb (141.1 kg). Ideal: Ideal body weight: 82.2 kg (181 lb 3.5 oz) Adjusted ideal body weight: 105.7 kg (233 lb 2.1 oz)   Cervical Spine Exam  Skin & Axial Inspection:No masses, redness, edema, swelling, or associated skin lesions Alignment:Symmetrical Functional XHB:ZJIR restricted ROM Stability:No instability detected Muscle Tone/Strength:Functionally intact. No obvious neuro-muscular anomalies detected. Sensory (Neurological):Musculoskeletal pain pattern Palpation:No palpable anomalies        Upper Extremity (UE) Exam    Side:Right  upper extremity  Side:Left upper extremity   Skin & Extremity Inspection:Skin color, temperature, and hair growth are WNL. No peripheral edema or cyanosis. No masses, redness, swelling, asymmetry, or associated skin lesions. No contractures.  Skin & Extremity Inspection:Skin color, temperature, and hair growth are WNL. No peripheral edema or cyanosis. No masses, redness, swelling, asymmetry, or associated skin lesions. No contractures.   Functional CVE:LFYBOFBPZWCH ROM  Functional ENI:DPOEUMPNTIRW ROM   Muscle Tone/Strength:Functionally intact. No obvious neuro-muscular anomalies detected.  Muscle Tone/Strength:Functionally intact. No obvious neuro-muscular anomalies detected.   Sensory (Neurological):Unimpaired  Sensory (Neurological):Unimpaired    Thoracic Spine Area Exam  Skin & Axial Inspection:No masses, redness, or swelling Alignment:Symmetrical Functional ERX:VQMGQQPYP ROM Stability:No instability detected Muscle Tone/Strength:Functionally intact. No obvious neuro-muscular anomalies detected. Sensory (Neurological):Musculoskeletal pain pattern Muscle strength & Tone:No palpable anomalies  Lumbar Exam  Skin & Axial Inspection:No masses, redness, or swelling Alignment:Symmetrical Functional PJK:DTOIZTIWP ROMaffecting both sides Stability:No instability detected Muscle Tone/Strength:Functionally intact. No obvious neuro-muscular anomalies detected. Sensory (Neurological):Musculoskeletal pain pattern  Gait & Posture Assessment  Ambulation:Limited Gait:Age-related, senile gait pattern Posture:Difficulty standing up straight, due to pain  Lower Extremity Exam    Side:Right lower extremity  Side:Left lower extremity  Stability:No instability observed  Stability:No instability observed  Skin & Extremity Inspection:Edema  Skin & Extremity Inspection:Pitting edemavenous stasis changes also  noted  Functional YKD:XIPJASNKNLZJ ROM   Functional QBH:ALPFXTKWIOXB ROM   Muscle Tone/Strength:Functionally intact. No obvious neuro-muscular anomalies detected.  Muscle Tone/Strength:Functionally intact. No obvious neuro-muscular anomalies detected.  Sensory (Neurological):Neurogenic pain pattern  Sensory (Neurological):Neurogenic pain pattern  DTR: Patellar:deferred today Achilles:deferred today Plantar:deferred today  DTR: Patellar:deferred today Achilles:deferred today Plantar:deferred today    Assessment   Status Diagnosis  Controlled Controlled Controlled 1. Chronic pain syndrome   2. Lumbar radiculopathy   3. Primary osteoarthritis of right knee   4. Lumbar facet arthropathy  5. Lumbar spondylosis   6. Lumbar degenerative disc disease   7. Primary osteoarthritis of left hip   8. Primary osteoarthritis of left knee   9. Mononeuropathy      Plan of Care  [name] has a current medication list which includes the following long-term medication(s): atorvastatin, enoxaparin, iron, furosemide, metoprolol succinate, [START ON 06/11/2020] oxycodone-acetaminophen, [START ON 07/11/2020] oxycodone-acetaminophen, [START ON 08/10/2020] oxycodone-acetaminophen, potassium chloride, pregabalin, and warfarin.  Pharmacotherapy (Medications Ordered): Meds ordered this encounter  Medications  . oxyCODONE-acetaminophen (PERCOCET) 10-325 MG tablet    Sig: Take 1 tablet by mouth every 6 (six) hours as needed for pain. Must last 30 days.    Dispense:  120 tablet    Refill:  0    Chronic Pain. (STOP Act - Not applicable). Fill one day early if closed on scheduled refill date.  Marland Kitchen oxyCODONE-acetaminophen (PERCOCET) 10-325 MG tablet    Sig: Take 1 tablet by mouth every 6 (six) hours as needed for pain. Must last 30 days.    Dispense:  120 tablet    Refill:  0    Chronic Pain. (STOP Act - Not applicable). Fill one day early  if closed on scheduled refill date.  Marland Kitchen oxyCODONE-acetaminophen (PERCOCET) 10-325 MG tablet    Sig: Take 1 tablet by mouth every 6 (six) hours as needed for pain. Must last 30 days.    Dispense:  120 tablet    Refill:  0    Chronic Pain. (STOP Act - Not applicable). Fill one day early if closed on scheduled refill date.  . pregabalin (LYRICA) 75 MG capsule    Sig: Take 1 capsule (75 mg total) by mouth 2 (two) times daily.    Dispense:  60 capsule    Refill:  3    Follow-up plan:   Return in about 3 months (around 08/30/2020) for Medication Management, in person, (UDS).   Recent Visits No visits were found meeting these conditions. Showing recent visits within past 90 days and meeting all other requirements Today's Visits Date Type Provider Dept  05/22/20 Office Visit Gillis Santa, MD [facility]  Showing today's visits and meeting all other requirements Future Appointments No visits were found meeting these conditions. Showing future appointments within next 90 days and meeting all other requirements  I discussed the assessment and treatment plan with the patient. The patient was provided an opportunity to ask questions and all were answered. The patient agreed with the plan and demonstrated an understanding of the instructions.  Patient advised to call back or seek an in-person evaluation if the symptoms or condition worsens.  Duration of encounter: 8mnutes.  Note by: BGillis Santa MD Date: 05/22/2020; Time: 2:33 PM

## 2020-05-22 NOTE — Progress Notes (Signed)
Safety precautions to be maintained throughout the outpatient stay will include: orient to surroundings, keep bed in low position, maintain call bell within reach at all times, provide assistance with transfer out of bed and ambulation.   Nursing Pain Medication Assessment:  Safety precautions to be maintained throughout the outpatient stay will include: orient to surroundings, keep bed in low position, maintain call bell within reach at all times, provide assistance with transfer out of bed and ambulation.  Medication Inspection Compliance: Pill count conducted under aseptic conditions, in front of the patient. Neither the pills nor the bottle was removed from the patient's sight at any time. Once count was completed pills were immediately returned to the patient in their original bottle.  Medication: Oxycodone/APAP Pill/Patch Count: 54 of 120 pills remain Pill/Patch Appearance: Markings consistent with prescribed medication Bottle Appearance: Intact  Filled Date: 12 / 04 / 2021 Last Medication intake:  Today  Patient states he has about 13-14 pills and his medication container at home he forgot to bring.

## 2020-05-23 ENCOUNTER — Encounter: Payer: Self-pay | Admitting: Vascular Surgery

## 2020-05-23 ENCOUNTER — Encounter: Payer: 59 | Admitting: Family Medicine

## 2020-05-23 ENCOUNTER — Ambulatory Visit (INDEPENDENT_AMBULATORY_CARE_PROVIDER_SITE_OTHER): Payer: 59 | Admitting: Vascular Surgery

## 2020-05-23 VITALS — BP 134/72 | HR 60 | Temp 97.0°F | Resp 16 | Ht 74.0 in | Wt 311.0 lb

## 2020-05-23 DIAGNOSIS — I83812 Varicose veins of left lower extremities with pain: Secondary | ICD-10-CM | POA: Diagnosis not present

## 2020-05-23 HISTORY — PX: ENDOVENOUS ABLATION SAPHENOUS VEIN W/ LASER: SUR449

## 2020-05-23 NOTE — Progress Notes (Signed)
     Laser Ablation Procedure    Date: 05/23/2020   [name] DOB:June 27, 1960  Consent signed: Yes      Surgeon: Fabienne Bruns MD   Procedure: Laser Ablation: left Small Saphenous Vein  BP 134/72 (BP Location: Left Arm, Patient Position: Sitting, Cuff Size: Large)   Pulse 60   Temp (!) 97 F (36.1 C) (Temporal)   Resp 16   Ht 6\' 2"  (1.88 m)   Wt (!) 311 lb (141.1 kg)   SpO2 98%   BMI 39.93 kg/m   Tumescent Anesthesia: 300 cc 0.9% NaCl with 50 cc Lidocaine HCL 1%  and 15 cc 8.4% NaHCO3  Local Anesthesia: 5 cc Lidocaine HCL and NaHCO3 (ratio 2:1)  7 watts continuous mode     Total energy: 647 Joules    Total time: 92 seconds Treatment Length 14 cm  Laser Fiber Ref. #                              Lot # [id number]     Patient tolerated procedure well  Notes: Patient wore face mask.  All staff members wore facial masks and facial shields/goggles.  [name] took last dose of Coumadin on 05-17-2020.  [name] took Ativan 1 mg two tablets at 8:00 AM on 05-23-2020.  Description of Procedure:  After marking the course of the secondary varicosities, the patient was placed on the operating table in the prone position, and the left leg was prepped and draped in sterile fashion.   Local anesthetic was administered and under ultrasound guidance the saphenous vein was accessed with a micro needle and guide wire; then the mirco puncture sheath was placed.  A guide wire was inserted saphenopopliteal junction , followed by a 5 french sheath.  The position of the sheath and then the laser fiber below the junction was confirmed using the ultrasound.  Tumescent anesthesia was administered along the course of the saphenous vein using ultrasound guidance. The patient was placed in Trendelenburg position and protective laser glasses were placed on patient and staff, and the laser was fired at 7 watts continuous mode for a total of 647 joules.       Steri strip were  applied to the IV insertion site and ABD pads and thigh high compression stockings were applied.  Ace wrap bandages were applied over the left calf and thigh  and at the top of the saphenopopliteal junction. Blood loss was less than 15 cc.  Discharge instructions reviewed with patient and hardcopy of discharge instructions given to patient to take home. The patient ambulated out of the operating room having tolerated the procedure well.  05-25-2020, MD [facility] Office: [phone]

## 2020-05-28 ENCOUNTER — Encounter: Payer: 59 | Admitting: Family Medicine

## 2020-05-30 ENCOUNTER — Other Ambulatory Visit: Payer: Self-pay

## 2020-05-30 ENCOUNTER — Ambulatory Visit (INDEPENDENT_AMBULATORY_CARE_PROVIDER_SITE_OTHER): Payer: 59 | Admitting: *Deleted

## 2020-05-30 DIAGNOSIS — I359 Nonrheumatic aortic valve disorder, unspecified: Secondary | ICD-10-CM

## 2020-05-30 DIAGNOSIS — Z7901 Long term (current) use of anticoagulants: Secondary | ICD-10-CM

## 2020-05-30 DIAGNOSIS — Z952 Presence of prosthetic heart valve: Secondary | ICD-10-CM

## 2020-05-30 LAB — POCT INR: INR: 1.8 — AB (ref 2.0–3.0)

## 2020-05-30 MED ORDER — ENOXAPARIN SODIUM 150 MG/ML ~~LOC~~ SOLN: 150.0000 mg | Freq: Two times a day (BID) | SUBCUTANEOUS | 1 refills | Status: DC

## 2020-05-30 NOTE — Patient Instructions (Addendum)
Description   Continue Lovenox through 06/02/20. Today and tomorrow take 2 tablets then continue taking Warfarin 1.5 tablets daily. Recheck INR in 1 week. [facility] [phone] & Fax number is [phone]

## 2020-06-06 ENCOUNTER — Ambulatory Visit (INDEPENDENT_AMBULATORY_CARE_PROVIDER_SITE_OTHER): Payer: 59 | Admitting: Family Medicine

## 2020-06-06 ENCOUNTER — Ambulatory Visit (INDEPENDENT_AMBULATORY_CARE_PROVIDER_SITE_OTHER): Payer: Self-pay | Admitting: Vascular Surgery

## 2020-06-06 ENCOUNTER — Other Ambulatory Visit: Payer: Self-pay

## 2020-06-06 ENCOUNTER — Encounter: Payer: Self-pay | Admitting: Vascular Surgery

## 2020-06-06 ENCOUNTER — Encounter: Payer: Self-pay | Admitting: Family Medicine

## 2020-06-06 ENCOUNTER — Ambulatory Visit (HOSPITAL_COMMUNITY)
Admission: RE | Admit: 2020-06-06 | Discharge: 2020-06-06 | Disposition: A | Discharge status: Home / Self Care | Payer: 59 | Source: Ambulatory Visit | Attending: Vascular Surgery | Admitting: Vascular Surgery

## 2020-06-06 ENCOUNTER — Ambulatory Visit (INDEPENDENT_AMBULATORY_CARE_PROVIDER_SITE_OTHER): Payer: 59

## 2020-06-06 VITALS — BP 101/59 | HR 50 | Temp 97.2°F | Ht 74.0 in | Wt 310.0 lb

## 2020-06-06 VITALS — BP 122/68 | HR 49 | Temp 97.2°F | Resp 16 | Ht 74.0 in | Wt 310.0 lb

## 2020-06-06 DIAGNOSIS — Z1159 Encounter for screening for other viral diseases: Secondary | ICD-10-CM

## 2020-06-06 DIAGNOSIS — Z952 Presence of prosthetic heart valve: Secondary | ICD-10-CM | POA: Diagnosis not present

## 2020-06-06 DIAGNOSIS — R7303 Prediabetes: Secondary | ICD-10-CM | POA: Diagnosis not present

## 2020-06-06 DIAGNOSIS — I359 Nonrheumatic aortic valve disorder, unspecified: Secondary | ICD-10-CM

## 2020-06-06 DIAGNOSIS — Z114 Encounter for screening for human immunodeficiency virus [HIV]: Secondary | ICD-10-CM

## 2020-06-06 DIAGNOSIS — Z5181 Encounter for therapeutic drug level monitoring: Secondary | ICD-10-CM

## 2020-06-06 DIAGNOSIS — Z23 Encounter for immunization: Secondary | ICD-10-CM

## 2020-06-06 DIAGNOSIS — Z7901 Long term (current) use of anticoagulants: Secondary | ICD-10-CM | POA: Diagnosis not present

## 2020-06-06 DIAGNOSIS — I10 Essential (primary) hypertension: Secondary | ICD-10-CM | POA: Diagnosis not present

## 2020-06-06 DIAGNOSIS — I83813 Varicose veins of bilateral lower extremities with pain: Secondary | ICD-10-CM | POA: Insufficient documentation

## 2020-06-06 DIAGNOSIS — I83812 Varicose veins of left lower extremities with pain: Secondary | ICD-10-CM

## 2020-06-06 LAB — POCT INR: INR: 2.3 (ref 2.0–3.0)

## 2020-06-06 NOTE — Patient Instructions (Signed)
Description   Take 2 tablets today, then start taking Warfarin 1.5 tablets daily except 2 tablets on Fridays. Recheck INR in 3 weeks. [facility] [phone] & Fax number is [phone]

## 2020-06-06 NOTE — Progress Notes (Signed)
12/29/20212:21 PM  [name] 18-Mar-1961, 59 y.o., male [id number]  Chief Complaint  Patient presents with  . transfer of care     HPI:   Patient is a 59 y.o. male with past medical history significant for HTN, prediabetes, HLD, mechanical AV replacement on coumadin, dHF, AAA repair, OSA on cpap, GERD, chronic pain (DDD lumbar spine with radiculopathy and bilateral knee OA and psoriasiswho presents today for toc.    Family lives nearby Lives with wife Retired  Dr. Burt Knack: Cardiology Dr. Oneida Alar: Vascular (endovenous ablation) Dr. Holley Raring: Pain [facility]:Nephrology   Screenings He reports last colonoscopy in 2014, h/o polyps, Dr Michail Sermon at [facility] Varicella zoster positive  Lab Results  Component Value Date   HGBA1C 6.0 (H) 03/01/2020   BP Readings from Last 3 Encounters:  06/06/20 (!) 101/59  06/06/20 122/68  05/23/20 134/72   Lab Results  Component Value Date   CHOL 171 03/01/2020   HDL 41 03/01/2020   LDLCALC 105 (H) 03/01/2020   TRIG 139 03/01/2020   CHOLHDL 4.2 03/01/2020   The 10-year ASCVD risk score Mikey Bussing DC Jr., et al., 2013) is: 16.8%   Values used to calculate the score:     Age: 56 years     Sex: Male     Is Non-Hispanic African American: Yes     Diabetic: Yes     Tobacco smoker: No     Systolic Blood Pressure: 833 mmHg     Is BP treated: Yes     HDL Cholesterol: 41 mg/dL     Total Cholesterol: 171 mg/dL    Depression screen [facility] 2/9 05/22/2020 05/16/2020 12/21/2019  Decreased Interest 0 0 0  Down, Depressed, Hopeless 0 0 0  PHQ - 2 Score 0 0 0  Altered sleeping - - -  Tired, decreased energy - - -  Change in appetite - - -  Feeling bad or failure about yourself  - - -  Trouble concentrating - - -  Moving slowly or fidgety/restless - - -  Suicidal thoughts - - -  PHQ-9 Score - - -  Difficult doing work/chores - - -  Some recent data might be hidden    Fall Risk  06/06/2020 05/22/2020 05/16/2020 03/01/2020  02/16/2020  Falls in the past year? 0 0 0 0 0  Comment - - - - -  Number falls in past yr: 0 0 0 0 -  Injury with Fall? 0 0 0 0 -  Risk for fall due to : - History of fall(s) - - -  Follow up Falls evaluation completed - Falls evaluation completed - -     Allergies  Allergen Reactions  . Nsaids Other (See Comments)    Told not to take meds-per patient    Prior to Admission medications   Medication Sig Start Date End Date Taking? Authorizing Provider  acetaminophen (TYLENOL) 325 MG tablet Take 2 tablets (650 mg total) by mouth every 6 (six) hours as needed. 05/20/18   Khatri, Hina, PA-C  atorvastatin (LIPITOR) 20 MG tablet Take 1 tablet (20 mg total) by mouth daily. 01/03/20   Sherren Mocha, MD  Chlorpheniramine-DM (CORICIDIN COUGH/COLD) 4-30 MG TABS Take 1 tablet by mouth 2 (two) times daily as needed. 05/16/20   Fin Hupp, Laurita Quint, FNP  Cholecalciferol (VITAMIN D) 50 MCG (2000 UT) tablet Take 1 tablet (2,000 Units total) by mouth 2 (two) times daily. 11/16/18   Isaiah Serge, NP  enoxaparin (LOVENOX) 150 MG/ML injection Inject  1 mL (150 mg total) into the skin every 12 (twelve) hours. 05/30/20   Sherren Mocha, MD  Ferrous Sulfate (IRON) 325 (65 Fe) MG TABS Take 1 tablet by mouth once daily 02/16/19   Isaiah Serge, NP  furosemide (LASIX) 40 MG tablet Take 1 tablet (40 mg total) by mouth 2 (two) times daily. 04/06/20   Maximiano Coss, NP  LORazepam (ATIVAN) 1 MG tablet Take 2 tablets 30 minutes prior to leaving house on day of office surgery. Patient not taking: Reported on 06/06/2020 04/19/20   Elam Dutch, MD  LORazepam (ATIVAN) 1 MG tablet Take 1 tablet 30 minutes prior to leaving house on day of procedure and bring second tablet with you to the office. Patient not taking: Reported on 06/06/2020 05/15/20   Elam Dutch, MD  metoprolol succinate (TOPROL-XL) 50 MG 24 hr tablet Take 1 tablet (50 mg total) by mouth daily. 02/17/20   Daleen Squibb, MD  Multiple  Vitamins-Minerals (CENTRUM SILVER ADULT 50+) TABS 1 qd    [provider]  NARCAN 4 MG/0.1ML LIQD nasal spray kit 1 spray once. Patient not taking: Reported on 06/06/2020 02/06/20   [provider]  oxyCODONE-acetaminophen (PERCOCET) 10-325 MG tablet Take 1 tablet by mouth every 6 (six) hours as needed for pain. Must last 30 days. 06/11/20 07/11/20  Gillis Santa, MD  oxyCODONE-acetaminophen (PERCOCET) 10-325 MG tablet Take 1 tablet by mouth every 6 (six) hours as needed for pain. Must last 30 days. 07/11/20 08/10/20  Gillis Santa, MD  oxyCODONE-acetaminophen (PERCOCET) 10-325 MG tablet Take 1 tablet by mouth every 6 (six) hours as needed for pain. Must last 30 days. 08/10/20 09/09/20  Gillis Santa, MD  potassium chloride (MICRO-K) 10 MEQ CR capsule Take 1 capsule (10 mEq total) by mouth daily. 04/03/20 03/29/21  Maximiano Coss, NP  pregabalin (LYRICA) 75 MG capsule Take 1 capsule (75 mg total) by mouth 2 (two) times daily. 05/22/20 09/19/20  Gillis Santa, MD  vitamin C (ASCORBIC ACID) 500 MG tablet Take 1,000 mg by mouth daily.     [provider]  warfarin (COUMADIN) 5 MG tablet TAKE 1.5 TABLETS DAILY OR AS DIRECTED BY COUMADIN CLINIC 04/02/20   Sherren Mocha, MD    Past Medical History:  Diagnosis Date  . Ascending aortic aneurysm and dissection    2006 repaired  7 cm aneurysm  . Chest pain, atypical   . Coronary artery reimplantation   . Hyperlipidemia    Mixed  . Hypertension    Unspecified  . Nuclear stress test    Myoview 08/2018:  EF 58, no scar or ischemia; Low Risk  . Obesity   . Psoriasis   . S/P aortic valve and root replacement    HX of [employer] Jude  . Seizures ([place])    one time incident 2018  . Sleep apnea, obstructive     Past Surgical History:  Procedure Laterality Date  . Ascending aortic dissection aneurysm  12/06/2004   ok for MRI 1.5 or 3T Using a 27-mm St[employer] Jude mechanical valve conduit with reimplantation of both coronary arteries  . CORNEAL  TRANSPLANT     R   . ENDOVENOUS ABLATION SAPHENOUS VEIN W/ LASER Right 04/25/2020   endovenous laser ablation right small saphenous vein by Ruta Hinds MD   . ENDOVENOUS ABLATION SAPHENOUS VEIN W/ LASER Left 05/23/2020   endovenous laser ablation left small saphenous vein by Ruta Hinds MD   . [place]  .  Replacement of aortic valve  12/06/2004    Social History   Tobacco Use  . Smoking status: Former Smoker    Packs/day: 0.10    Years: 18.00    Pack years: 1.80    Types: Cigarettes    Quit date: 12/07/1989    Years since quitting: 30.5  . Smokeless tobacco: Never Used  Substance Use Topics  . Alcohol use: No    Comment: Quit 1991    Family History  Problem Relation Age of Onset  . Cervical cancer Mother   . Coronary artery disease Mother   . Diabetes Mother   . Diabetes Father   . Cancer Father        lung cancer   . Obesity Father   . Alcohol abuse Father   . Cervical cancer Sister   . Cancer Brother        lung cancer  . Alcohol abuse Brother   . Kidney disease Other        dialysis   Review of Systems  Constitutional: Negative for chills, fever and malaise/fatigue.  Eyes: Negative for blurred vision and double vision.  Respiratory: Negative for cough, shortness of breath and wheezing.   Cardiovascular: Positive for leg swelling. Negative for chest pain and palpitations.  Gastrointestinal: Negative for abdominal pain, blood in stool, constipation, diarrhea, heartburn, nausea and vomiting.  Genitourinary: Positive for frequency (On lasix). Negative for dysuria and hematuria.  Musculoskeletal: Positive for back pain and joint pain (Bilateral knees and hips).  Skin: Negative for rash.  Neurological: Negative for dizziness, weakness and headaches.  Psychiatric/Behavioral: Negative for depression and suicidal ideas.     OBJECTIVE:  Today's Vitals   06/06/20 1251  BP: (!) 101/59  Pulse: (!) 50  Temp: (!) 97.2 F (36.2 C)  SpO2: 96%  Weight:  (!) 310 lb (140.6 kg)  Height: _0  (1.88 m)   Body mass index is 39.8 kg/m.  Wt Readings from Last 3 Encounters:  06/06/20 (!) 310 lb (140.6 kg)  06/06/20 (!) 310 lb (140.6 kg)  05/23/20 (!) 311 lb (141.1 kg)     Physical Exam Vitals reviewed.  Constitutional:      Appearance: Normal appearance.  HENT:     Head: Normocephalic and atraumatic.  Eyes:     Conjunctiva/sclera: Conjunctivae normal.     Pupils: Pupils are equal, round, and reactive to light.  Cardiovascular:     Rate and Rhythm: Normal rate and regular rhythm.     Pulses: Normal pulses.     Heart sounds: No murmur heard. No friction rub. No gallop.      Comments: click Pulmonary:     Effort: Pulmonary effort is normal. No respiratory distress.     Breath sounds: Normal breath sounds. No stridor. No wheezing or rales.  Abdominal:     General: Bowel sounds are normal.     Palpations: Abdomen is soft.     Tenderness: There is no abdominal tenderness.  Musculoskeletal:     Right lower leg: No edema.     Left lower leg: No edema.  Skin:    General: Skin is warm and dry.  Neurological:     General: No focal deficit present.     Mental Status: He is alert and oriented to person, place, and time.  Psychiatric:        Mood and Affect: Mood normal.        Behavior: Behavior normal.     Results for orders placed or performed  in visit on 06/06/20 (from the past 24 hour(s))  POCT INR     Status: None   Collection Time: 06/06/20 11:20 AM  Result Value Ref Range   INR 2.3 2.0 - 3.0    VAS [place] LOWER EXTREMITY VENOUS POST ABLATION  Result Date: 06/06/2020  Lower Venous DVT Study Indications: Pain, Swelling, and varicosities.  Risk Factors: Surgery Left small saphenous vein ablation 05/24/2019. Performing Technologist: Delorise Shiner RVT  Examination Guidelines: A complete evaluation includes B-mode imaging, spectral Doppler, color Doppler, and power Doppler as needed of all accessible portions of each vessel.  Bilateral testing is considered an integral part of a complete examination. Limited examinations for reoccurring indications may be performed as noted. The reflux portion of the exam is performed with the patient in reverse Trendelenburg.  +-----+---------------+---------+-----------+----------+--------------+ RIGHTCompressibilityPhasicitySpontaneityPropertiesThrombus Aging +-----+---------------+---------+-----------+----------+--------------+ CFV  Full                    Yes                                 +-----+---------------+---------+-----------+----------+--------------+   +---------+---------------+---------+-----------+----------+--------------+ LEFT     CompressibilityPhasicitySpontaneityPropertiesThrombus Aging +---------+---------------+---------+-----------+----------+--------------+ CFV      Full                    Yes                                 +---------+---------------+---------+-----------+----------+--------------+ FV DistalFull                    Yes                                 +---------+---------------+---------+-----------+----------+--------------+ POP      Full                    Yes                                 +---------+---------------+---------+-----------+----------+--------------+ SSV      None                               dilated                  +---------+---------------+---------+-----------+----------+--------------+ Left small saphenous vein is non compressible with thrombus extending to the sapheo-popliteal junction. No thrombus identified within the popliteal vein.    Summary: RIGHT: - No evidence of deep vein thrombosis in the lower extremity. No indirect evidence of obstruction proximal to the inguinal ligament.  LEFT: - There is no evidence of deep vein thrombosis in the lower extremity.  - No cystic structure found in the popliteal fossa. - Findings are consistent with successful ablation of left small saphenous  vein.  *See table(s) above for measurements and observations. Electronically signed by Ruta Hinds MD on 06/06/2020 at 10:49:14 AM.    Final      ASSESSMENT and PLAN  Problem List Items Addressed This Visit      Cardiovascular and Mediastinum   Essential hypertension   Relevant Orders   CMP14+EGFR    Other Visit Diagnoses    Encounter for screening for HIV    -  Primary   Relevant Orders   HIV Antibody (routine testing w rflx)   Encounter for hepatitis C screening test for low risk patient       Relevant Orders   Hepatitis C antibody   Prediabetes       Relevant Orders   Hemoglobin A1c   Microalbumin/Creatinine Ratio, Urine  Will follow up with lab results   Need for vaccination       Relevant Orders   Varicella-zoster vaccine IM (Shingrix)       Return in about 3 months (around 09/04/2020).    Huston Foley Maryln Eastham, FNP-BC [facility] [place], [place] [address] Ph.  [phone] Fax [phone]

## 2020-06-06 NOTE — Progress Notes (Signed)
   Current Outpatient Medications on File Prior to Visit  Medication Sig Dispense Refill  . acetaminophen (TYLENOL) 325 MG tablet Take 2 tablets (650 mg total) by mouth every 6 (six) hours as needed. 30 tablet 0  . atorvastatin (LIPITOR) 20 MG tablet Take 1 tablet (20 mg total) by mouth daily. 90 tablet 2  . Chlorpheniramine-DM (CORICIDIN COUGH/COLD) 4-30 MG TABS Take 1 tablet by mouth 2 (two) times daily as needed. 14 tablet 0  . Cholecalciferol (VITAMIN D) 50 MCG (2000 UT) tablet Take 1 tablet (2,000 Units total) by mouth 2 (two) times daily. 180 tablet 0  . enoxaparin (LOVENOX) 150 MG/ML injection Inject 1 mL (150 mg total) into the skin every 12 (twelve) hours. 10 mL 1  . Ferrous Sulfate (IRON) 325 (65 Fe) MG TABS Take 1 tablet by mouth once daily 90 tablet 0  . furosemide (LASIX) 40 MG tablet Take 1 tablet (40 mg total) by mouth 2 (two) times daily. 90 tablet 1  . LORazepam (ATIVAN) 1 MG tablet Take 2 tablets 30 minutes prior to leaving house on day of office surgery. 2 tablet 0  . LORazepam (ATIVAN) 1 MG tablet Take 1 tablet 30 minutes prior to leaving house on day of procedure and bring second tablet with you to the office. 2 tablet 0  . metoprolol succinate (TOPROL-XL) 50 MG 24 hr tablet Take 1 tablet (50 mg total) by mouth daily. 90 tablet 3  . Multiple Vitamins-Minerals (CENTRUM SILVER ADULT 50+) TABS 1 qd    . NARCAN 4 MG/0.1ML LIQD nasal spray kit 1 spray once.    Derrill Memo ON 06/11/2020] oxyCODONE-acetaminophen (PERCOCET) 10-325 MG tablet Take 1 tablet by mouth every 6 (six) hours as needed for pain. Must last 30 days. 120 tablet 0  . [START ON 07/11/2020] oxyCODONE-acetaminophen (PERCOCET) 10-325 MG tablet Take 1 tablet by mouth every 6 (six) hours as needed for pain. Must last 30 days. 120 tablet 0  . [START ON 08/10/2020] oxyCODONE-acetaminophen (PERCOCET) 10-325 MG tablet Take 1 tablet by mouth every 6 (six) hours as needed for pain. Must last 30 days. 120 tablet 0  . potassium chloride  (MICRO-K) 10 MEQ CR capsule Take 1 capsule (10 mEq total) by mouth daily. 90 capsule 1  . pregabalin (LYRICA) 75 MG capsule Take 1 capsule (75 mg total) by mouth 2 (two) times daily. 60 capsule 3  . vitamin C (ASCORBIC ACID) 500 MG tablet Take 1,000 mg by mouth daily.     Marland Kitchen warfarin (COUMADIN) 5 MG tablet TAKE 1.5 TABLETS DAILY OR AS DIRECTED BY COUMADIN CLINIC (Patient not taking: Reported on 05/23/2020) 150 tablet 0   No current facility-administered medications on file prior to visit.     Data: Patient had a duplex ultrasound today which shows successful ablation of the lesser saphenous vein with extension up to but not including the popliteal vein.  Assessment: Successful ablation left lesser saphenous vein May 23, 2020 and previously right lesser saphenous vein November 2021.  Patient is on chronic anticoagulation therapy.  He will continue this.   Plan: Patient will follow up on an as-needed basis. Ruta Hinds, MD [facility] Office: [phone]  Plan: See above.  Patient will follow up on an as-needed basis.

## 2020-06-06 NOTE — Patient Instructions (Addendum)
Zoster Vaccine, Recombinant injection What is this medicine? ZOSTER VACCINE (ZOS ter vak SEEN) is used to prevent shingles in adults 61 years old and over. This vaccine is not used to treat shingles or nerve pain from shingles. This medicine may be used for other purposes; ask your health care provider or pharmacist if you have questions. COMMON BRAND NAME(S): [facility] What should I tell my health care provider before I take this medicine? They need to know if you have any of these conditions:  blood disorders or disease  cancer like leukemia or lymphoma  immune system problems or therapy  an unusual or allergic reaction to vaccines, other medications, foods, dyes, or preservatives  pregnant or trying to get pregnant  breast-feeding How should I use this medicine? This vaccine is for injection in a muscle. It is given by a health care professional. Talk to your pediatrician regarding the use of this medicine in children. This medicine is not approved for use in children. Overdosage: If you think you have taken too much of this medicine contact a poison control center or emergency room at once. NOTE: This medicine is only for you. Do not share this medicine with others. What if I miss a dose? Keep appointments for follow-up (booster) doses as directed. It is important not to miss your dose. Call your doctor or health care professional if you are unable to keep an appointment. What may interact with this medicine?  medicines that suppress your immune system  medicines to treat cancer  steroid medicines like prednisone or cortisone This list may not describe all possible interactions. Give your health care provider a list of all the medicines, herbs, non-prescription drugs, or dietary supplements you use. Also tell them if you smoke, drink alcohol, or use illegal drugs. Some items may interact with your medicine. What should I watch for while using this medicine? Visit your doctor for  regular check ups. This vaccine, like all vaccines, may not fully protect everyone. What side effects may I notice from receiving this medicine? Side effects that you should report to your doctor or health care professional as soon as possible:  allergic reactions like skin rash, itching or hives, swelling of the face, lips, or tongue  breathing problems Side effects that usually do not require medical attention (report these to your doctor or health care professional if they continue or are bothersome):  chills  headache  fever  nausea, vomiting  redness, warmth, pain, swelling or itching at site where injected  tiredness This list may not describe all possible side effects. Call your doctor for medical advice about side effects. You may report side effects to FDA at 1-800-FDA-1088. Where should I keep my medicine? This vaccine is only given in a clinic, pharmacy, doctor's office, or other health care setting and will not be stored at home. NOTE: This sheet is a summary. It may not cover all possible information. If you have questions about this medicine, talk to your doctor, pharmacist, or health care provider.  2020 Elsevier/Gold Standard (2017-01-05 13:20:30)  Health Maintenance, Male Adopting a healthy lifestyle and getting preventive care are important in promoting health and wellness. Ask your health care provider about:  The right schedule for you to have regular tests and exams.  Things you can do on your own to prevent diseases and keep yourself healthy. What should I know about diet, weight, and exercise? Eat a healthy diet   Eat a diet that includes plenty of vegetables, fruits, low-fat  dairy products, and lean protein.  Do not eat a lot of foods that are high in solid fats, added sugars, or sodium. Maintain a healthy weight Body mass index (BMI) is a measurement that can be used to identify possible weight problems. It estimates body fat based on height and  weight. Your health care provider can help determine your BMI and help you achieve or maintain a healthy weight. Get regular exercise Get regular exercise. This is one of the most important things you can do for your health. Most adults should:  Exercise for at least 150 minutes each week. The exercise should increase your heart rate and make you sweat (moderate-intensity exercise).  Do strengthening exercises at least twice a week. This is in addition to the moderate-intensity exercise.  Spend less time sitting. Even light physical activity can be beneficial. Watch cholesterol and blood lipids Have your blood tested for lipids and cholesterol at 61 years of age, then have this test every 5 years. You may need to have your cholesterol levels checked more often if:  Your lipid or cholesterol levels are high.  You are older than 61 years of age.  You are at high risk for heart disease. What should I know about cancer screening? Many types of cancers can be detected early and may often be prevented. Depending on your health history and family history, you may need to have cancer screening at various ages. This may include screening for:  Colorectal cancer.  Prostate cancer.  Skin cancer.  Lung cancer. What should I know about heart disease, diabetes, and high blood pressure? Blood pressure and heart disease  High blood pressure causes heart disease and increases the risk of stroke. This is more likely to develop in people who have high blood pressure readings, are of African descent, or are overweight.  Talk with your health care provider about your target blood pressure readings.  Have your blood pressure checked: ? Every 3-5 years if you are 92-2 years of age. ? Every year if you are 60 years old or older.  If you are between the ages of 65 and 63 and are a current or former smoker, ask your health care provider if you should have a one-time screening for abdominal aortic  aneurysm (AAA). Diabetes Have regular diabetes screenings. This checks your fasting blood sugar level. Have the screening done:  Once every three years after age 53 if you are at a normal weight and have a low risk for diabetes.  More often and at a younger age if you are overweight or have a high risk for diabetes. What should I know about preventing infection? Hepatitis B If you have a higher risk for hepatitis B, you should be screened for this virus. Talk with your health care provider to find out if you are at risk for hepatitis B infection. Hepatitis C Blood testing is recommended for:  Everyone born from [birth year] through 1967.  Anyone with known risk factors for hepatitis C. Sexually transmitted infections (STIs)  You should be screened each year for STIs, including gonorrhea and chlamydia, if: ? You are sexually active and are younger than 61 years of age. ? You are older than 61 years of age and your health care provider tells you that you are at risk for this type of infection. ? Your sexual activity has changed since you were last screened, and you are at increased risk for chlamydia or gonorrhea. Ask your health care provider if you are  at risk.  Ask your health care provider about whether you are at high risk for HIV. Your health care provider may recommend a prescription medicine to help prevent HIV infection. If you choose to take medicine to prevent HIV, you should first get tested for HIV. You should then be tested every 3 months for as long as you are taking the medicine. Follow these instructions at home: Lifestyle  Do not use any products that contain nicotine or tobacco, such as cigarettes, e-cigarettes, and chewing tobacco. If you need help quitting, ask your health care provider.  Do not use street drugs.  Do not share needles.  Ask your health care provider for help if you need support or information about quitting drugs. Alcohol use  Do not drink alcohol if  your health care provider tells you not to drink.  If you drink alcohol: ? Limit how much you have to 0-2 drinks a day. ? Be aware of how much alcohol is in your drink. In the [place] one drink equals one 12 oz bottle of beer (355 mL), one 5 oz glass of wine (148 mL), or one 1 oz glass of hard liquor (44 mL). General instructions  Schedule regular health, dental, and eye exams.  Stay current with your vaccines.  Tell your health care provider if: ? You often feel depressed. ? You have ever been abused or do not feel safe at home. Summary  Adopting a healthy lifestyle and getting preventive care are important in promoting health and wellness.  Follow your health care provider's instructions about healthy diet, exercising, and getting tested or screened for diseases.  Follow your health care provider's instructions on monitoring your cholesterol and blood pressure. This information is not intended to replace advice given to you by your health care provider. Make sure you discuss any questions you have with your health care provider. Document Revised: 05/19/2018 Document Reviewed: 05/19/2018 Elsevier Patient Education  [employer].   If you have lab work done today you will be contacted with your lab results within the next 2 weeks.  If you have not heard from [place] then please contact us. The fastest way to get your results is to register for My Chart.   IF you received an x-ray today, you will receive an invoice from [facility]. Please contact [facility] at [phone] with questions or concerns regarding your invoice.   IF you received labwork today, you will receive an invoice from [place]. Please contact [facility] at [phone] with questions or concerns regarding your invoice.   Our billing staff will not be able to assist you with questions regarding bills from these companies.  You will be contacted with the lab results as soon as they are available.  The fastest way to get your results is to activate your My Chart account. Instructions are located on the last page of this paperwork. If you have not heard from [place] regarding the results in 2 weeks, please contact this office.

## 2020-06-07 LAB — CMP14+EGFR
ALT: 46 IU/L — ABNORMAL HIGH (ref 0–44)
AST: 11 IU/L (ref 0–40)
Albumin/Globulin Ratio: 1.4 (ref 1.2–2.2)
Albumin: 4.2 g/dL (ref 3.8–4.8)
Alkaline Phosphatase: 98 IU/L (ref 44–121)
BUN/Creatinine Ratio: 19 (ref 10–24)
BUN: 15 mg/dL (ref 8–27)
Bilirubin Total: 0.3 mg/dL (ref 0.0–1.2)
CO2: 28 mmol/L (ref 20–29)
Calcium: 9.4 mg/dL (ref 8.6–10.2)
Chloride: 103 mmol/L (ref 96–106)
Creatinine, Ser: 0.79 mg/dL (ref 0.76–1.27)
GFR calc Af Amer: 112 mL/min/{1.73_m2} (ref >59)
GFR calc non Af Amer: 97 mL/min/{1.73_m2} (ref >59)
Globulin, Total: 2.9 g/dL (ref 1.5–4.5)
Glucose: 93 mg/dL (ref 65–99)
Potassium: 4.4 mmol/L (ref 3.5–5.2)
Sodium: 146 mmol/L — ABNORMAL HIGH (ref 134–144)
Total Protein: 7.1 g/dL (ref 6.0–8.5)

## 2020-06-07 LAB — MICROALBUMIN / CREATININE URINE RATIO
Creatinine, Urine: 229 mg/dL
Microalb/Creat Ratio: 6 mg/g creat (ref 0–29)
Microalbumin, Urine: 12.7 ug/mL

## 2020-06-07 LAB — HEPATITIS C ANTIBODY: Hep C Virus Ab: <0.1 s/co ratio (ref 0.0–0.9)

## 2020-06-07 LAB — HEMOGLOBIN A1C
Est. average glucose Bld gHb Est-mCnc: 114 mg/dL
Hgb A1c MFr Bld: 5.6 % (ref 4.8–5.6)

## 2020-06-07 LAB — HIV ANTIBODY (ROUTINE TESTING W REFLEX): HIV Screen 4th Generation wRfx: NONREACTIVE

## 2020-06-13 ENCOUNTER — Ambulatory Visit: Payer: Self-pay | Admitting: *Deleted

## 2020-06-13 NOTE — Telephone Encounter (Signed)
I returned pt's call.   He was exposed to his grandson who tested positive for covid 2 weeks ago.   Pt does not have symptoms and has had both covid vaccines and the booster.  I let him know his 14 day quarantine period is complete and he has not developed symptoms he did not need to be tested unless he started developing symptoms then he needs to go be tested right away.  He verbalized understanding and thanked me for calling him back. And answering his questions.     Reason for Disposition . COVID-19 Testing, questions about  Answer Assessment - Initial Assessment Questions 1. COVID-19 DIAGNOSIS: "Who made your COVID-19 diagnosis?" "Was it confirmed by a positive lab test?" If not diagnosed by a HCP, ask "Are there lots of cases (community spread) where you live?" Note: See public health department website, if unsure.     My grandson found out positive for covid yesterday.  It's been 2 weeks since I was with him.    Pt without symptoms.  I got both covid vaccines and the booster my wife and I both. 2. COVID-19 EXPOSURE: "Was there any known exposure to COVID before the symptoms began?" CDC Definition of close contact: within 6 feet (2 meters) for a total of 15 minutes or more over a 24-hour period.      Yes to grandson 2 weeks ago 3. ONSET: "When did the COVID-19 symptoms start?"      No symptoms 4. WORST SYMPTOM: "What is your worst symptom?" (e.g., cough, fever, shortness of breath, muscle aches)     N/A 5. COUGH: "Do you have a cough?" If Yes, ask: "How bad is the cough?"       N/A 6. FEVER: "Do you have a fever?" If Yes, ask: "What is your temperature, how was it measured, and when did it start?"     N/A 7. RESPIRATORY STATUS: "Describe your breathing?" (e.g., shortness of breath, wheezing, unable to speak)      N/A 8. BETTER-SAME-WORSE: "Are you getting better, staying the same or getting worse compared to yesterday?"  If getting worse, ask, "In what way?"     N/A 9. HIGH RISK  DISEASE: "Do you have any chronic medical problems?" (e.g., asthma, heart or lung disease, weak immune system, obesity, etc.)     Cardiac 10. VACCINE: "Have you gotten the COVID-19 vaccine?" If Yes ask: "Which one, how many shots, when did you get it?"       Yes both vaccines and the booster. 11. PREGNANCY: "Is there any chance you are pregnant?" "When was your last menstrual period?"       N/A 12. OTHER SYMPTOMS: "Do you have any other symptoms?"  (e.g., chills, fatigue, headache, loss of smell or taste, muscle pain, sore throat; new loss of smell or taste especially support the diagnosis of COVID-19)       No  Protocols used: CORONAVIRUS (COVID-19) DIAGNOSED OR SUSPECTED-A-AH

## 2020-06-18 ENCOUNTER — Encounter (HOSPITAL_COMMUNITY): Payer: Self-pay | Admitting: Physician Assistant

## 2020-06-18 ENCOUNTER — Other Ambulatory Visit (HOSPITAL_COMMUNITY): Payer: 59

## 2020-07-02 ENCOUNTER — Encounter (HOSPITAL_COMMUNITY): Payer: Self-pay | Admitting: Physician Assistant

## 2020-07-02 ENCOUNTER — Telehealth (HOSPITAL_COMMUNITY): Payer: Self-pay | Admitting: Physician Assistant

## 2020-07-02 NOTE — Telephone Encounter (Addendum)
Just an FYI. We have made several attempts to contact this patient including sending a letter to schedule or reschedule their echocardiogram. We will be removing the patient from the echo WQ.  06/18/20 MAILED LETTER [facility]  06/18/20 NO SHOW/[facility]   Thank you    Completed

## 2020-07-05 ENCOUNTER — Ambulatory Visit (INDEPENDENT_AMBULATORY_CARE_PROVIDER_SITE_OTHER): Payer: 59 | Admitting: *Deleted

## 2020-07-05 ENCOUNTER — Other Ambulatory Visit: Payer: Self-pay

## 2020-07-05 DIAGNOSIS — Z952 Presence of prosthetic heart valve: Secondary | ICD-10-CM | POA: Diagnosis not present

## 2020-07-05 DIAGNOSIS — Z7901 Long term (current) use of anticoagulants: Secondary | ICD-10-CM | POA: Diagnosis not present

## 2020-07-05 DIAGNOSIS — Z5181 Encounter for therapeutic drug level monitoring: Secondary | ICD-10-CM

## 2020-07-05 DIAGNOSIS — I359 Nonrheumatic aortic valve disorder, unspecified: Secondary | ICD-10-CM | POA: Diagnosis not present

## 2020-07-05 LAB — POCT INR: INR: 3 (ref 2.0–3.0)

## 2020-07-05 NOTE — Patient Instructions (Signed)
Description   Continue taking Warfarin 1.5 tablets daily except 2 tablets on Fridays. Recheck INR in 4 weeks. [facility] [phone] & Fax number is [phone]

## 2020-07-10 ENCOUNTER — Other Ambulatory Visit: Payer: Self-pay | Admitting: Cardiovascular Disease

## 2020-07-10 DIAGNOSIS — I359 Nonrheumatic aortic valve disorder, unspecified: Secondary | ICD-10-CM

## 2020-07-10 DIAGNOSIS — Z952 Presence of prosthetic heart valve: Secondary | ICD-10-CM

## 2020-07-10 DIAGNOSIS — Z5181 Encounter for therapeutic drug level monitoring: Secondary | ICD-10-CM

## 2020-07-10 DIAGNOSIS — Z7901 Long term (current) use of anticoagulants: Secondary | ICD-10-CM

## 2020-07-10 NOTE — Telephone Encounter (Signed)
*  STAT* If patient is at the pharmacy, call can be transferred to refill team.   1. Which medications need to be refilled? (please list name of each medication and dose if known) Warfain   2. Which pharmacy/location (including street and city if local pharmacy) is medication to be sent to? [facility]   3. Do they need a 30 day or 90 day supply? 3 month

## 2020-07-20 ENCOUNTER — Other Ambulatory Visit: Payer: Self-pay | Admitting: Registered Nurse

## 2020-07-20 ENCOUNTER — Other Ambulatory Visit: Payer: Self-pay | Admitting: Cardiovascular Disease

## 2020-07-20 DIAGNOSIS — E870 Hyperosmolality and hypernatremia: Secondary | ICD-10-CM

## 2020-08-02 ENCOUNTER — Ambulatory Visit (INDEPENDENT_AMBULATORY_CARE_PROVIDER_SITE_OTHER): Payer: 59 | Admitting: *Deleted

## 2020-08-02 ENCOUNTER — Other Ambulatory Visit: Payer: Self-pay

## 2020-08-02 DIAGNOSIS — Z952 Presence of prosthetic heart valve: Secondary | ICD-10-CM

## 2020-08-02 DIAGNOSIS — Z7901 Long term (current) use of anticoagulants: Secondary | ICD-10-CM | POA: Diagnosis not present

## 2020-08-02 DIAGNOSIS — Z5181 Encounter for therapeutic drug level monitoring: Secondary | ICD-10-CM | POA: Diagnosis not present

## 2020-08-02 DIAGNOSIS — I359 Nonrheumatic aortic valve disorder, unspecified: Secondary | ICD-10-CM | POA: Diagnosis not present

## 2020-08-02 LAB — POCT INR: INR: 4.1 — AB (ref 2.0–3.0)

## 2020-08-02 NOTE — Patient Instructions (Signed)
Description   Do not take any Warfarin today then continue taking Warfarin 1.5 tablets daily except 2 tablets on Fridays. Recheck INR in 3 weeks. [facility] [phone] & Fax number is [phone]

## 2020-08-07 ENCOUNTER — Telehealth: Payer: Self-pay | Admitting: *Deleted

## 2020-08-07 ENCOUNTER — Telehealth: Payer: Self-pay

## 2020-08-07 NOTE — Telephone Encounter (Signed)
Patient called and questions answered.

## 2020-08-07 NOTE — Telephone Encounter (Signed)
He states the pharmacy did not get the third prescription that was supposed to be sent.

## 2020-08-07 NOTE — Telephone Encounter (Signed)
Returned patient call. No answer. LVM 

## 2020-08-21 ENCOUNTER — Encounter: Payer: Self-pay | Admitting: Student in an Organized Health Care Education/Training Program

## 2020-08-21 ENCOUNTER — Ambulatory Visit
Payer: 59 | Attending: Student in an Organized Health Care Education/Training Program | Admitting: Student in an Organized Health Care Education/Training Program

## 2020-08-21 ENCOUNTER — Other Ambulatory Visit: Payer: Self-pay

## 2020-08-21 VITALS — BP 144/83 | HR 50 | Temp 97.1°F | Ht 74.0 in | Wt 310.0 lb

## 2020-08-21 DIAGNOSIS — M5416 Radiculopathy, lumbar region: Secondary | ICD-10-CM | POA: Diagnosis not present

## 2020-08-21 DIAGNOSIS — G894 Chronic pain syndrome: Secondary | ICD-10-CM | POA: Diagnosis not present

## 2020-08-21 DIAGNOSIS — M47816 Spondylosis without myelopathy or radiculopathy, lumbar region: Secondary | ICD-10-CM | POA: Insufficient documentation

## 2020-08-21 DIAGNOSIS — G589 Mononeuropathy, unspecified: Secondary | ICD-10-CM | POA: Diagnosis not present

## 2020-08-21 MED ORDER — OXYCODONE-ACETAMINOPHEN 10-325 MG PO TABS: 1.0000 | ORAL_TABLET | Freq: Four times a day (QID) | ORAL | 0 refills | Status: DC | PRN

## 2020-08-21 MED ORDER — PREGABALIN 75 MG PO CAPS: 75.0000 mg | ORAL_CAPSULE | Freq: Two times a day (BID) | ORAL | 3 refills | Status: DC

## 2020-08-21 NOTE — Progress Notes (Signed)
PROVIDER NOTE: Information contained herein reflects review and annotations entered in association with encounter. Interpretation of such information and data should be left to medically-trained personnel. Information provided to patient can be located elsewhere in the medical record under "Patient Instructions". Document created using STT-dictation technology, any transcriptional errors that may result from process are unintentional.    Patient: [name]  Service Category: E/M  Provider: Gillis Santa, MD  DOB: [birth year]  DOS: 08/21/2020  Specialty: Interventional Pain Management  MRN: [id number]  Setting: Ambulatory outpatient  PCP: Just, Laurita Quint, FNP  Type: Established Patient    Referring Provider: No ref. provider found  Location: Office  Delivery: Face-to-face     HPI  [name], a 60 y.o. year old male, is here today because of his Chronic pain syndrome [G89.4]. [name]'s primary complain today is Back Pain (Back and knee) Last encounter: My last encounter with him was on 05/22/2020. Pertinent problems: [name] has OBESITY; Obstructive sleep apnea; Aortic valve disorder; S/P aortic valve replacement; Long term current use of anticoagulant; Chronic pain syndrome; Pain in joint, pelvic region and thigh; Pain in right hip; and Lumbar radiculopathy, chronic on their pertinent problem list. Pain Assessment: Severity of Chronic pain is reported as a 7 /10. Location: Back Left,Right/pain radiaties down both arm. Onset: More than a month ago. Quality: Aching,Burning,Shooting,Sharp,Throbbing,Constant. Timing: Constant. Modifying factor(s): meds andd rest. Vitals:  height is 6' 2"  (1.88 m) and weight is 310 lb (140.6 kg) (abnormal). His temperature is 97.1 F (36.2 C) (abnormal). His blood pressure is 144/83 (abnormal) and his pulse is 50 (abnormal). His oxygen saturation is 100%.   Reason for encounter: medication management.    No change in medical history since last  visit.  Patient's pain is at baseline.  Patient continues multimodal pain regimen as prescribed.  States that it provides pain relief and improvement in functional status.  Pharmacotherapy Assessment   Analgesic: Percocet 10 mg 4 times daily as needed, quantity 120/month MME equals 60   Monitoring: [place] PMP: PDMP reviewed during this encounter.       Pharmacotherapy: No side-effects or adverse reactions reported. Compliance: No problems identified. Effectiveness: Clinically acceptable.  Chauncey Fischer, RN  08/21/2020  1:30 PM  Sign when Signing Visit Nursing Pain Medication Assessment:  Safety precautions to be maintained throughout the outpatient stay will include: orient to surroundings, keep bed in low position, maintain call bell within reach at all times, provide assistance with transfer out of bed and ambulation.  Medication Inspection Compliance: Pill count conducted under aseptic conditions, in front of the patient. Neither the pills nor the bottle was removed from the patient's sight at any time. Once count was completed pills were immediately returned to the patient in their original bottle.  Medication: Oxycodone/APAP Pill/Patch Count: 30 of 120 pills remain Pill/Patch Appearance: Markings consistent with prescribed medication Bottle Appearance: Standard pharmacy container. Clearly labeled. Filled Date: 3 / 4 / 22 Last Medication intake:  Today    Pt stated that he had 9 pills left in his medication pack at home.   Safety precautions to be maintained throughout the outpatient stay will include: orient to surroundings, keep bed in low position, maintain call bell within reach at all times, provide assistance with transfer out of bed and ambulation.     UDS:  Summary  Date Value Ref Range Status  09/06/2019 Note  Final    Comment:    ==================================================================== Compliance Drug Analysis,  Ur ====================================================================  Test                             Result       Flag       Units Drug Present and Declared for Prescription Verification   Oxycodone                      591          EXPECTED   ng/mg creat   Oxymorphone                    1065         EXPECTED   ng/mg creat   Noroxycodone                   1752         EXPECTED   ng/mg creat   Noroxymorphone                 325          EXPECTED   ng/mg creat    Sources of oxycodone are scheduled prescription medications.    Oxymorphone, noroxycodone, and noroxymorphone are expected    metabolites of oxycodone. Oxymorphone is also available as a    scheduled prescription medication.   Pregabalin                     PRESENT      EXPECTED   Acetaminophen                  PRESENT      EXPECTED   Metoprolol                     PRESENT      EXPECTED Drug Absent but Declared for Prescription Verification   Salicylate                     Not Detected UNEXPECTED    Aspirin, as indicated in the declared medication list, is not always    detected even when used as directed. ==================================================================== Test                      Result    Flag   Units      Ref Range   Creatinine              129              mg/dL      >=20 ==================================================================== Declared Medications:  The flagging and interpretation on this report are based on the  following declared medications.  Unexpected results may arise from  inaccuracies in the declared medications.  **Note: The testing scope of this panel includes these medications:  Metoprolol (Toprol)  Oxycodone (Percocet)  Pregabalin (Lyrica)  **Note: The testing scope of this panel does not include small to  moderate amounts of these reported medications:  Acetaminophen (Tylenol)  Acetaminophen (Percocet)  Aspirin  **Note: The testing scope of this panel does not include  the  following reported medications:  Atorvastatin (Lipitor)  Furosemide (Lasix)  Iron  Multivitamin (Centrum)  Potassium (Klor-Con)  Secukinumab (Cosentyx)  Vitamin C  Vitamin D  Vitamin D3  Warfarin (Coumadin) ==================================================================== For clinical consultation, please call [phone]. ====================================================================      ROS  Constitutional: Denies any fever or chills Gastrointestinal: No reported  hemesis, hematochezia, vomiting, or acute GI distress Musculoskeletal: Denies any acute onset joint swelling, redness, loss of ROM, or weakness Neurological: No reported episodes of acute onset apraxia, aphasia, dysarthria, agnosia, amnesia, paralysis, loss of coordination, or loss of consciousness  Medication Review  Centrum Silver Adult 50+, Iron, Vitamin D, acetaminophen, atorvastatin, furosemide, metoprolol succinate, naloxone, oxyCODONE-acetaminophen, potassium chloride, pregabalin, vitamin C, and warfarin  History Review  Allergy: Mr. Rhines is allergic to nsaids. Drug: Mr. Murguia  reports no history of drug use. Alcohol:  reports no history of alcohol use. Tobacco:  reports that he quit smoking about 30 years ago. His smoking use included cigarettes. He has a 1.80 pack-year smoking history. He has never used smokeless tobacco. Social: Mr. Rao  reports that he quit smoking about 30 years ago. His smoking use included cigarettes. He has a 1.80 pack-year smoking history. He has never used smokeless tobacco. He reports that he does not drink alcohol and does not use drugs. Medical:  has a past medical history of Ascending aortic aneurysm and dissection, Chest pain, atypical, Coronary artery reimplantation, Hyperlipidemia, Hypertension, Nuclear stress test, Obesity, Psoriasis, S/P aortic valve and root replacement, Seizures ([place]), and Sleep apnea, obstructive. Surgical: Mr. Sevey  has a past  surgical history that includes Replacement of aortic valve (12/06/2004); Ascending aortic dissection aneurysm (12/06/2004); Knee surgery; Corneal transplant; Endovenous ablation saphenous vein w/ laser (Right, 04/25/2020); and Endovenous ablation saphenous vein w/ laser (Left, 05/23/2020). Family: family history includes Alcohol abuse in his brother and father; Cancer in his brother and father; Cervical cancer in his mother and sister; Coronary artery disease in his mother; Diabetes in his father and mother; Kidney disease in an other family member; Obesity in his father.  Laboratory Chemistry Profile   Renal Lab Results  Component Value Date   BUN 15 06/06/2020   CREATININE 0.79 06/06/2020   BCR 19 06/06/2020   GFRAA 112 06/06/2020   GFRNONAA 97 06/06/2020     Hepatic Lab Results  Component Value Date   AST 11 06/06/2020   ALT 46 (H) 06/06/2020   ALBUMIN 4.2 06/06/2020   ALKPHOS 98 06/06/2020   LIPASE 21 07/26/2016     Electrolytes Lab Results  Component Value Date   NA 146 (H) 06/06/2020   K 4.4 06/06/2020   CL 103 06/06/2020   CALCIUM 9.4 06/06/2020   MG 1.9 03/18/2015     Bone No results found for: VD25OH, VD125OH2TOT, HB7169CV8, LF8101BP1, 25OHVITD1, 25OHVITD2, 25OHVITD3, TESTOFREE, TESTOSTERONE   Inflammation (CRP: Acute Phase) (ESR: Chronic Phase) No results found for: CRP, ESRSEDRATE, LATICACIDVEN     Note: Above Lab results reviewed.  Recent Imaging Review  VAS [place] LOWER EXTREMITY VENOUS POST ABLATION  Lower Venous DVT Study  Indications: Pain, Swelling, and varicosities.   Risk Factors: Surgery Left small saphenous vein ablation 05/24/2019. Performing Technologist: Delorise Shiner RVT    Examination Guidelines: A complete evaluation includes B-mode imaging, spectral Doppler, color Doppler, and power Doppler as needed of all accessible portions of each vessel. Bilateral testing is considered an integral part of a complete examination. Limited examinations  for reoccurring indications may be performed as noted. The reflux portion of the exam is performed with the patient in reverse Trendelenburg.    +-----+---------------+---------+-----------+----------+--------------+ RIGHTCompressibilityPhasicitySpontaneityPropertiesThrombus Aging +-----+---------------+---------+-----------+----------+--------------+ CFV  Full                    Yes                                 +-----+---------------+---------+-----------+----------+--------------+        +---------+---------------+---------+-----------+----------+--------------+  LEFT     CompressibilityPhasicitySpontaneityPropertiesThrombus Aging +---------+---------------+---------+-----------+----------+--------------+ CFV      Full                    Yes                                 +---------+---------------+---------+-----------+----------+--------------+ FV DistalFull                    Yes                                 +---------+---------------+---------+-----------+----------+--------------+ POP      Full                    Yes                                 +---------+---------------+---------+-----------+----------+--------------+ SSV      None                               dilated                  +---------+---------------+---------+-----------+----------+--------------+  Left small saphenous vein is non compressible with thrombus extending to the sapheo-popliteal junction. No thrombus identified within the popliteal vein.          Summary: RIGHT: - No evidence of deep vein thrombosis in the lower extremity. No indirect evidence of obstruction proximal to the inguinal ligament.   LEFT: - There is no evidence of deep vein thrombosis in the lower extremity.   - No cystic structure found in the popliteal fossa. - Findings are consistent with successful ablation of left small saphenous vein.   *See table(s) above for  measurements and observations.  Electronically signed by Ruta Hinds MD on 06/06/2020 at 10:49:14 AM.      Final   Note: Reviewed        Physical Exam  General appearance: Well nourished, well developed, and well hydrated. In no apparent acute distress Mental status: Alert, oriented x 3 (person, place, & time)       Respiratory: No evidence of acute respiratory distress Eyes: PERLA Vitals: BP (!) 144/83   Pulse (!) 50   Temp (!) 97.1 F (36.2 C)   Ht 6' 2"  (1.88 m)   Wt (!) 310 lb (140.6 kg)   SpO2 100%   BMI 39.80 kg/m  BMI: Estimated body mass index is 39.8 kg/m as calculated from the following:   Height as of this encounter: 6' 2"  (1.88 m).   Weight as of this encounter: 310 lb (140.6 kg). Ideal: Ideal body weight: 82.2 kg (181 lb 3.5 oz) Adjusted ideal body weight: 105.6 kg (232 lb 11.7 oz)  Cervical Spine Exam  Skin & Axial Inspection:No masses, redness, edema, swelling, or associated skin lesions Alignment:Symmetrical Functional YPP:JKDT restricted ROM Stability:No instability detected Muscle Tone/Strength:Functionally intact. No obvious neuro-muscular anomalies detected. Sensory (Neurological):Musculoskeletal pain pattern Palpation:No palpable anomalies        Upper Extremity (UE) Exam    Side:Right upper extremity  Side:Left upper extremity   Skin & Extremity Inspection:Skin color, temperature, and hair growth are WNL. No peripheral edema or cyanosis. No masses, redness, swelling, asymmetry, or associated skin lesions. No contractures.  Skin & Extremity Inspection:Skin color, temperature, and hair growth are WNL. No peripheral edema or cyanosis. No masses, redness, swelling, asymmetry, or associated skin lesions. No contractures.   Functional JGG:EZMOQHUTMLYY ROM  Functional TKP:TWSFKCLEXNTZ ROM   Muscle Tone/Strength:Functionally intact. No obvious neuro-muscular anomalies detected.  Muscle  Tone/Strength:Functionally intact. No obvious neuro-muscular anomalies detected.   Sensory (Neurological):Unimpaired  Sensory (Neurological):Unimpaired    Thoracic Spine Area Exam  Skin & Axial Inspection:No masses, redness, or swelling Alignment:Symmetrical Functional GYF:VCBSWHQPR ROM Stability:No instability detected Muscle Tone/Strength:Functionally intact. No obvious neuro-muscular anomalies detected. Sensory (Neurological):Musculoskeletal pain pattern Muscle strength & Tone:No palpable anomalies  Lumbar Exam  Skin & Axial Inspection:No masses, redness, or swelling Alignment:Symmetrical Functional FFM:BWGYKZLDJ ROMaffecting both sides Stability:No instability detected Muscle Tone/Strength:Functionally intact. No obvious neuro-muscular anomalies detected. Sensory (Neurological):Musculoskeletal pain pattern  Gait & Posture Assessment  Ambulation:Limited Gait:Age-related, senile gait pattern Posture:Difficulty standing up straight, due to pain  Lower Extremity Exam    Side:Right lower extremity  Side:Left lower extremity  Stability:No instability observed  Stability:No instability observed  Skin & Extremity Inspection:Edema  Skin & Extremity Inspection:Pitting edemavenous stasis changes also noted  Functional TTS:VXBLTJQZESPQ ROM   Functional ZRA:QTMAUQJFHLKT ROM   Muscle Tone/Strength:Functionally intact. No obvious neuro-muscular anomalies detected.  Muscle Tone/Strength:Functionally intact. No obvious neuro-muscular anomalies detected.  Sensory (Neurological):Neurogenic pain pattern  Sensory (Neurological):Neurogenic pain pattern   Assessment   Status Diagnosis  Controlled Controlled Controlled 1. Chronic pain syndrome   2. Lumbar radiculopathy   3. Lumbar facet arthropathy   4. Mononeuropathy      Plan of Care  [name] has a  current medication list which includes the following long-term medication(s): atorvastatin, iron, furosemide, metoprolol succinate, potassium chloride, warfarin, [START ON 09/09/2020] oxycodone-acetaminophen, [START ON 10/09/2020] oxycodone-acetaminophen, [START ON 11/08/2020] oxycodone-acetaminophen, and pregabalin.  Pharmacotherapy (Medications Ordered): Meds ordered this encounter  Medications  . oxyCODONE-acetaminophen (PERCOCET) 10-325 MG tablet    Sig: Take 1 tablet by mouth every 6 (six) hours as needed for pain. Must last 30 days.    Dispense:  120 tablet    Refill:  0    Chronic Pain. (STOP Act - Not applicable). Fill one day early if closed on scheduled refill date.  Marland Kitchen oxyCODONE-acetaminophen (PERCOCET) 10-325 MG tablet    Sig: Take 1 tablet by mouth every 6 (six) hours as needed for pain. Must last 30 days.    Dispense:  120 tablet    Refill:  0    Chronic Pain. (STOP Act - Not applicable). Fill one day early if closed on scheduled refill date.  Marland Kitchen oxyCODONE-acetaminophen (PERCOCET) 10-325 MG tablet    Sig: Take 1 tablet by mouth every 6 (six) hours as needed for pain. Must last 30 days.    Dispense:  120 tablet    Refill:  0    Chronic Pain. (STOP Act - Not applicable). Fill one day early if closed on scheduled refill date.  . pregabalin (LYRICA) 75 MG capsule    Sig: Take 1 capsule (75 mg total) by mouth 2 (two) times daily.    Dispense:  60 capsule    Refill:  3  Follow-up plan:   Return in about 3 months (around 11/21/2020) for Medication Management, in person.   Recent Visits No visits were found meeting these conditions. Showing recent visits within past 90 days and meeting all other requirements Today's Visits Date Type Provider Dept  08/21/20 Office Visit Gillis Santa, MD [facility]  Showing today's visits and meeting all other  requirements Future Appointments No visits were found meeting these conditions. Showing future appointments within next 90 days and  meeting all other requirements  I discussed the assessment and treatment plan with the patient. The patient was provided an opportunity to ask questions and all were answered. The patient agreed with the plan and demonstrated an understanding of the instructions.  Patient advised to call back or seek an in-person evaluation if the symptoms or condition worsens.  Duration of encounter: 82mnutes.  Note by: BGillis Santa MD Date: 08/21/2020; Time: 2:19 PM

## 2020-08-21 NOTE — Progress Notes (Signed)
Nursing Pain Medication Assessment:  Safety precautions to be maintained throughout the outpatient stay will include: orient to surroundings, keep bed in low position, maintain call bell within reach at all times, provide assistance with transfer out of bed and ambulation.  Medication Inspection Compliance: Pill count conducted under aseptic conditions, in front of the patient. Neither the pills nor the bottle was removed from the patient's sight at any time. Once count was completed pills were immediately returned to the patient in their original bottle.  Medication: Oxycodone/APAP Pill/Patch Count: 30 of 120 pills remain Pill/Patch Appearance: Markings consistent with prescribed medication Bottle Appearance: Standard pharmacy container. Clearly labeled. Filled Date: 3 / 4 / 22 Last Medication intake:  Today    Pt stated that he had 9 pills left in his medication pack at home.   Safety precautions to be maintained throughout the outpatient stay will include: orient to surroundings, keep bed in low position, maintain call bell within reach at all times, provide assistance with transfer out of bed and ambulation.

## 2020-08-28 ENCOUNTER — Ambulatory Visit (INDEPENDENT_AMBULATORY_CARE_PROVIDER_SITE_OTHER): Payer: 59 | Admitting: *Deleted

## 2020-08-28 ENCOUNTER — Other Ambulatory Visit: Payer: Self-pay

## 2020-08-28 DIAGNOSIS — I359 Nonrheumatic aortic valve disorder, unspecified: Secondary | ICD-10-CM

## 2020-08-28 DIAGNOSIS — Z5181 Encounter for therapeutic drug level monitoring: Secondary | ICD-10-CM

## 2020-08-28 DIAGNOSIS — Z7901 Long term (current) use of anticoagulants: Secondary | ICD-10-CM | POA: Diagnosis not present

## 2020-08-28 DIAGNOSIS — Z952 Presence of prosthetic heart valve: Secondary | ICD-10-CM | POA: Diagnosis not present

## 2020-08-28 LAB — POCT INR: INR: 3.3 — AB (ref 2.0–3.0)

## 2020-08-28 NOTE — Patient Instructions (Signed)
Description   Continue taking Warfarin 1.5 tablets daily except 2 tablets on Fridays. Recheck INR in 4 weeks. [facility] [phone] & Fax number is [phone]

## 2020-09-04 ENCOUNTER — Ambulatory Visit: Payer: 59 | Admitting: Family Medicine

## 2020-09-12 ENCOUNTER — Encounter: Payer: 59 | Admitting: Registered Nurse

## 2020-09-26 ENCOUNTER — Encounter: Payer: 59 | Admitting: Registered Nurse

## 2020-09-28 ENCOUNTER — Other Ambulatory Visit: Payer: Self-pay

## 2020-09-28 ENCOUNTER — Ambulatory Visit (INDEPENDENT_AMBULATORY_CARE_PROVIDER_SITE_OTHER): Payer: PPO | Admitting: Pharmacist

## 2020-09-28 DIAGNOSIS — Z5181 Encounter for therapeutic drug level monitoring: Secondary | ICD-10-CM

## 2020-09-28 DIAGNOSIS — Z7901 Long term (current) use of anticoagulants: Secondary | ICD-10-CM

## 2020-09-28 DIAGNOSIS — I359 Nonrheumatic aortic valve disorder, unspecified: Secondary | ICD-10-CM | POA: Diagnosis not present

## 2020-09-28 DIAGNOSIS — Z952 Presence of prosthetic heart valve: Secondary | ICD-10-CM | POA: Diagnosis not present

## 2020-09-28 LAB — POCT INR: INR: 2.7 (ref 2.0–3.0)

## 2020-09-28 NOTE — Patient Instructions (Signed)
Description   Continue taking Warfarin 1.5 tablets daily except 2 tablets on Fridays. Recheck INR in 5 weeks. [facility] [phone] & Fax number is [phone]

## 2020-10-03 ENCOUNTER — Encounter: Payer: 59 | Admitting: Registered Nurse

## 2020-10-17 ENCOUNTER — Other Ambulatory Visit: Payer: Self-pay | Admitting: Registered Nurse

## 2020-10-17 ENCOUNTER — Other Ambulatory Visit: Payer: Self-pay | Admitting: Cardiovascular Disease

## 2020-10-17 DIAGNOSIS — Z7901 Long term (current) use of anticoagulants: Secondary | ICD-10-CM

## 2020-10-17 DIAGNOSIS — Z952 Presence of prosthetic heart valve: Secondary | ICD-10-CM

## 2020-10-17 DIAGNOSIS — Z5181 Encounter for therapeutic drug level monitoring: Secondary | ICD-10-CM

## 2020-10-17 DIAGNOSIS — I359 Nonrheumatic aortic valve disorder, unspecified: Secondary | ICD-10-CM

## 2020-10-31 ENCOUNTER — Encounter: Payer: 59 | Admitting: Medical

## 2020-11-01 ENCOUNTER — Other Ambulatory Visit: Payer: Self-pay

## 2020-11-01 DIAGNOSIS — E870 Hyperosmolality and hypernatremia: Secondary | ICD-10-CM

## 2020-11-01 MED ORDER — FUROSEMIDE 40 MG PO TABS
40.0000 mg | ORAL_TABLET | Freq: Two times a day (BID) | ORAL | 0 refills | Status: DC
Start: 2020-11-01 — End: 2021-02-12

## 2020-11-01 NOTE — Telephone Encounter (Signed)
Pt called to r/s [facility] appointment, pt requesting refill on Lasix be sent to his pharmacy.  Will forward message to refill department.

## 2020-11-06 ENCOUNTER — Other Ambulatory Visit: Payer: Self-pay

## 2020-11-06 ENCOUNTER — Ambulatory Visit (INDEPENDENT_AMBULATORY_CARE_PROVIDER_SITE_OTHER): Payer: PPO

## 2020-11-06 DIAGNOSIS — Z7901 Long term (current) use of anticoagulants: Secondary | ICD-10-CM

## 2020-11-06 DIAGNOSIS — I359 Nonrheumatic aortic valve disorder, unspecified: Secondary | ICD-10-CM

## 2020-11-06 DIAGNOSIS — Z5181 Encounter for therapeutic drug level monitoring: Secondary | ICD-10-CM | POA: Diagnosis not present

## 2020-11-06 DIAGNOSIS — Z952 Presence of prosthetic heart valve: Secondary | ICD-10-CM | POA: Diagnosis not present

## 2020-11-06 LAB — POCT INR: INR: 3.2 — AB (ref 2.0–3.0)

## 2020-11-06 NOTE — Patient Instructions (Signed)
Continue taking Warfarin 1.5 tablets daily except 2 tablets on Fridays. Recheck INR in 6 weeks. [facility] [phone] & Fax number is [phone]  

## 2020-11-20 ENCOUNTER — Telehealth: Payer: Self-pay | Admitting: *Deleted

## 2020-11-20 ENCOUNTER — Encounter: Payer: Self-pay | Admitting: Student in an Organized Health Care Education/Training Program

## 2020-11-20 ENCOUNTER — Ambulatory Visit
Payer: PPO | Attending: Student in an Organized Health Care Education/Training Program | Admitting: Student in an Organized Health Care Education/Training Program

## 2020-11-20 ENCOUNTER — Other Ambulatory Visit: Payer: Self-pay

## 2020-11-20 DIAGNOSIS — G589 Mononeuropathy, unspecified: Secondary | ICD-10-CM | POA: Insufficient documentation

## 2020-11-20 DIAGNOSIS — M5416 Radiculopathy, lumbar region: Secondary | ICD-10-CM | POA: Insufficient documentation

## 2020-11-20 DIAGNOSIS — M47816 Spondylosis without myelopathy or radiculopathy, lumbar region: Secondary | ICD-10-CM | POA: Diagnosis not present

## 2020-11-20 DIAGNOSIS — G894 Chronic pain syndrome: Secondary | ICD-10-CM | POA: Diagnosis not present

## 2020-11-20 MED ORDER — PREGABALIN 75 MG PO CAPS: 75.0000 mg | ORAL_CAPSULE | Freq: Two times a day (BID) | ORAL | 6 refills | Status: DC

## 2020-11-20 MED ORDER — OXYCODONE-ACETAMINOPHEN 10-325 MG PO TABS: 1.0000 | ORAL_TABLET | Freq: Four times a day (QID) | ORAL | 0 refills | Status: DC | PRN

## 2020-11-20 NOTE — Progress Notes (Signed)
PROVIDER NOTE: Information contained herein reflects review and annotations entered in association with encounter. Interpretation of such information and data should be left to medically-trained personnel. Information provided to patient can be located elsewhere in the medical record under "Patient Instructions". Document created using STT-dictation technology, any transcriptional errors that may result from process are unintentional.    Patient: [name]  Service Category: E/M  Provider: Gillis Santa, MD  DOB: 15-Apr-1961  DOS: 11/20/2020  Specialty: Interventional Pain Management  MRN: [id number]  Setting: Ambulatory outpatient  PCP: Pcp, No  Type: Established Patient    Referring Provider: Just, Laurita Quint, FNP  Location: Office  Delivery: Face-to-face     HPI  [name], a 60 y.o. year old male, is here today because of his No primary diagnosis found.. [name]'s primary complain today is Back Pain (lower) and Knee Pain (bilateral) Last encounter: My last encounter with him was on 05/22/2020. Pertinent problems: [name] has OBESITY; Obstructive sleep apnea; Aortic valve disorder; S/P aortic valve replacement; Long term current use of anticoagulant; Chronic pain syndrome; Pain in joint, pelvic region and thigh; Pain in right hip; and Lumbar radiculopathy, chronic on their pertinent problem list. Pain Assessment: Severity of   is reported as a 6 /10. Location: Back Lower/denies. Onset: More than a month ago. Quality: Aching. Timing: Intermittent. Modifying factor(s): medications. Vitals:  height is 6' 2"  (1.88 m) and weight is 285 lb (129.3 kg). His temporal temperature is 97.2 F (36.2 C) (abnormal). His blood pressure is 149/71 (abnormal) and his pulse is 47 (abnormal). His respiration is 16 and oxygen saturation is 99%.   Reason for encounter: medication management.    No change in medical history since last visit.  Patient's pain is at baseline.  Patient continues  multimodal pain regimen as prescribed.  States that it provides pain relief and improvement in functional status. No falls.  Pharmacotherapy Assessment   Analgesic: Percocet 10 mg 4 times daily as needed, quantity 120/month MME equals 60   Monitoring: [place] PMP: PDMP reviewed during this encounter.       Pharmacotherapy: No side-effects or adverse reactions reported. Compliance: No problems identified. Effectiveness: Clinically acceptable.  Landis Martins, RN  11/20/2020  1:19 PM  Sign when Signing Visit Nursing Pain Medication Assessment:  Safety precautions to be maintained throughout the outpatient stay will include: orient to surroundings, keep bed in low position, maintain call bell within reach at all times, provide assistance with transfer out of bed and ambulation.  Medication Inspection Compliance: Pill count conducted under aseptic conditions, in front of the patient. Neither the pills nor the bottle was removed from the patient's sight at any time. Once count was completed pills were immediately returned to the patient in their original bottle.  Medication: Oxycodone/APAP Pill/Patch Count:  17 of 120 pills remain Pill/Patch Appearance: Markings consistent with prescribed medication Bottle Appearance: Standard pharmacy container. Clearly labeled. Filled Date: 06 / 04 / 2022 Last Medication intake:  Today   UDS:  Summary  Date Value Ref Range Status  09/06/2019 Note  Final    Comment:    ==================================================================== Compliance Drug Analysis, Ur ==================================================================== Test                             Result       Flag       Units Drug Present and Declared for Prescription Verification   Oxycodone  591          EXPECTED   ng/mg creat   Oxymorphone                    1065         EXPECTED   ng/mg creat   Noroxycodone                   1752         EXPECTED   ng/mg creat    Noroxymorphone                 325          EXPECTED   ng/mg creat    Sources of oxycodone are scheduled prescription medications.    Oxymorphone, noroxycodone, and noroxymorphone are expected    metabolites of oxycodone. Oxymorphone is also available as a    scheduled prescription medication.   Pregabalin                     PRESENT      EXPECTED   Acetaminophen                  PRESENT      EXPECTED   Metoprolol                     PRESENT      EXPECTED Drug Absent but Declared for Prescription Verification   Salicylate                     Not Detected UNEXPECTED    Aspirin, as indicated in the declared medication list, is not always    detected even when used as directed. ==================================================================== Test                      Result    Flag   Units      Ref Range   Creatinine              129              mg/dL      >=20 ==================================================================== Declared Medications:  The flagging and interpretation on this report are based on the  following declared medications.  Unexpected results may arise from  inaccuracies in the declared medications.  **Note: The testing scope of this panel includes these medications:  Metoprolol (Toprol)  Oxycodone (Percocet)  Pregabalin (Lyrica)  **Note: The testing scope of this panel does not include small to  moderate amounts of these reported medications:  Acetaminophen (Tylenol)  Acetaminophen (Percocet)  Aspirin  **Note: The testing scope of this panel does not include the  following reported medications:  Atorvastatin (Lipitor)  Furosemide (Lasix)  Iron  Multivitamin (Centrum)  Potassium (Klor-Con)  Secukinumab (Cosentyx)  Vitamin C  Vitamin D  Vitamin D3  Warfarin (Coumadin) ==================================================================== For clinical consultation, please call [phone]. ====================================================================      ROS  Constitutional: Denies any fever or chills Gastrointestinal: No reported hemesis, hematochezia, vomiting, or acute GI distress Musculoskeletal:  +LBP Neurological: No reported episodes of acute onset apraxia, aphasia, dysarthria, agnosia, amnesia, paralysis, loss of coordination, or loss of consciousness  Medication Review  Centrum Silver Adult 50+, Iron, Vitamin D, acetaminophen, atorvastatin, furosemide, metoprolol succinate, naloxone, oxyCODONE-acetaminophen, potassium chloride, pregabalin, vitamin C, and warfarin  History Review  Allergy: Mr. Galentine is allergic to nsaids. Drug: Mr. Chait  reports no history of  drug use. Alcohol:  reports no history of alcohol use. Tobacco:  reports that he quit smoking about 30 years ago. His smoking use included cigarettes. He has a 1.80 pack-year smoking history. He has never used smokeless tobacco. Social: Mr. Vanhoesen  reports that he quit smoking about 30 years ago. His smoking use included cigarettes. He has a 1.80 pack-year smoking history. He has never used smokeless tobacco. He reports that he does not drink alcohol and does not use drugs. Medical:  has a past medical history of Ascending aortic aneurysm and dissection, Chest pain, atypical, Coronary artery reimplantation, Hyperlipidemia, Hypertension, Nuclear stress test, Obesity, Psoriasis, S/P aortic valve and root replacement, Seizures ([place]), and Sleep apnea, obstructive. Surgical: Mr. Aymond  has a past surgical history that includes Replacement of aortic valve (12/06/2004); Ascending aortic dissection aneurysm (12/06/2004); Knee surgery; Corneal transplant; Endovenous ablation saphenous vein w/ laser (Right, 04/25/2020); and Endovenous ablation saphenous vein w/ laser (Left, 05/23/2020). Family: family history includes Alcohol abuse in his brother and father; Cancer in his brother and father; Cervical  cancer in his mother and sister; Coronary artery disease in his mother; Diabetes in his father and mother; Kidney disease in an other family member; Obesity in his father.  Laboratory Chemistry Profile   Renal Lab Results  Component Value Date   BUN 15 06/06/2020   CREATININE 0.79 06/06/2020   BCR 19 06/06/2020   GFRAA 112 06/06/2020   GFRNONAA 97 06/06/2020     Hepatic Lab Results  Component Value Date   AST 11 06/06/2020   ALT 46 (H) 06/06/2020   ALBUMIN 4.2 06/06/2020   ALKPHOS 98 06/06/2020   LIPASE 21 07/26/2016     Electrolytes Lab Results  Component Value Date   NA 146 (H) 06/06/2020   K 4.4 06/06/2020   CL 103 06/06/2020   CALCIUM 9.4 06/06/2020   MG 1.9 03/18/2015     Bone No results found for: VD25OH, VD125OH2TOT, YC1448JE5, UD1497WY6, 25OHVITD1, 25OHVITD2, 25OHVITD3, TESTOFREE, TESTOSTERONE   Inflammation (CRP: Acute Phase) (ESR: Chronic Phase) No results found for: CRP, ESRSEDRATE, LATICACIDVEN     Note: Above Lab results reviewed.  Recent Imaging Review  VAS [place] LOWER EXTREMITY VENOUS POST ABLATION  Lower Venous DVT Study  Indications: Pain, Swelling, and varicosities.   Risk Factors: Surgery Left small saphenous vein ablation 05/24/2019. Performing Technologist: Delorise Shiner RVT    Examination Guidelines: A complete evaluation includes B-mode imaging, spectral Doppler, color Doppler, and power Doppler as needed of all accessible portions of each vessel. Bilateral testing is considered an integral part of a complete examination. Limited examinations for reoccurring indications may be performed as noted. The reflux portion of the exam is performed with the patient in reverse Trendelenburg.    +-----+---------------+---------+-----------+----------+--------------+ RIGHTCompressibilityPhasicitySpontaneityPropertiesThrombus Aging +-----+---------------+---------+-----------+----------+--------------+ CFV  Full                    Yes                                  +-----+---------------+---------+-----------+----------+--------------+        +---------+---------------+---------+-----------+----------+--------------+ LEFT     CompressibilityPhasicitySpontaneityPropertiesThrombus Aging +---------+---------------+---------+-----------+----------+--------------+ CFV      Full                    Yes                                 +---------+---------------+---------+-----------+----------+--------------+  FV DistalFull                    Yes                                 +---------+---------------+---------+-----------+----------+--------------+ POP      Full                    Yes                                 +---------+---------------+---------+-----------+----------+--------------+ SSV      None                               dilated                  +---------+---------------+---------+-----------+----------+--------------+  Left small saphenous vein is non compressible with thrombus extending to the sapheo-popliteal junction. No thrombus identified within the popliteal vein.          Summary: RIGHT: - No evidence of deep vein thrombosis in the lower extremity. No indirect evidence of obstruction proximal to the inguinal ligament.   LEFT: - There is no evidence of deep vein thrombosis in the lower extremity.   - No cystic structure found in the popliteal fossa. - Findings are consistent with successful ablation of left small saphenous vein.   *See table(s) above for measurements and observations.  Electronically signed by Ruta Hinds MD on 06/06/2020 at 10:49:14 AM.      Final   Note: Reviewed        Physical Exam  General appearance: Well nourished, well developed, and well hydrated. In no apparent acute distress Mental status: Alert, oriented x 3 (person, place, & time)       Respiratory: No evidence of acute respiratory distress Eyes: PERLA Vitals: BP  (!) 149/71 (BP Location: Left Arm, Patient Position: Sitting, Cuff Size: Large)   Pulse (!) 47   Temp (!) 97.2 F (36.2 C) (Temporal)   Resp 16   Ht 6' 2"  (1.88 m)   Wt 285 lb (129.3 kg)   SpO2 99%   BMI 36.59 kg/m  BMI: Estimated body mass index is 36.59 kg/m as calculated from the following:   Height as of this encounter: 6' 2"  (1.88 m).   Weight as of this encounter: 285 lb (129.3 kg). Ideal: Ideal body weight: 82.2 kg (181 lb 3.5 oz) Adjusted ideal body weight: 101 kg (222 lb 11.7 oz)  Cervical Spine Exam  Skin & Axial Inspection: No masses, redness, edema, swelling, or associated skin lesions Alignment: Symmetrical Functional ROM: Pain restricted ROM      Stability: No instability detected Muscle Tone/Strength: Functionally intact. No obvious neuro-muscular anomalies detected. Sensory (Neurological): Musculoskeletal pain pattern Palpation: No palpable anomalies                          Upper Extremity (UE) Exam      Side: Right upper extremity   Side: Left upper extremity    Skin & Extremity Inspection: Skin color, temperature, and hair growth are WNL. No peripheral edema or cyanosis. No masses, redness, swelling, asymmetry, or associated skin lesions. No contractures.   Skin & Extremity Inspection: Skin color, temperature, and hair growth are WNL. No peripheral edema  or cyanosis. No masses, redness, swelling, asymmetry, or associated skin lesions. No contractures.    Functional ROM: Unrestricted ROM           Functional ROM: Unrestricted ROM            Muscle Tone/Strength: Functionally intact. No obvious neuro-muscular anomalies detected.   Muscle Tone/Strength: Functionally intact. No obvious neuro-muscular anomalies detected.    Sensory (Neurological): Unimpaired           Sensory (Neurological): Unimpaired              Thoracic Spine Area Exam  Skin & Axial Inspection: No masses, redness, or swelling Alignment: Symmetrical Functional ROM: Decreased ROM Stability: No  instability detected Muscle Tone/Strength: Functionally intact. No obvious neuro-muscular anomalies detected. Sensory (Neurological): Musculoskeletal pain pattern Muscle strength & Tone: No palpable anomalies   Lumbar Exam  Skin & Axial Inspection: No masses, redness, or swelling Alignment: Symmetrical Functional ROM: Decreased ROM affecting both sides Stability: No instability detected Muscle Tone/Strength: Functionally intact. No obvious neuro-muscular anomalies detected. Sensory (Neurological): Musculoskeletal pain pattern   Gait & Posture Assessment  Ambulation: Limited Gait: Age-related, senile gait pattern Posture: Difficulty standing up straight, due to pain    Lower Extremity Exam      Side: Right lower extremity   Side: Left lower extremity  Stability: No instability observed           Stability: No instability observed          Skin & Extremity Inspection: Edema   Skin & Extremity Inspection: Pitting edema venous stasis changes also noted  Functional ROM: Unrestricted ROM                   Functional ROM: Unrestricted ROM                  Muscle Tone/Strength: Functionally intact. No obvious neuro-muscular anomalies detected.   Muscle Tone/Strength: Functionally intact. No obvious neuro-muscular anomalies detected.  Sensory (Neurological): Neurogenic pain pattern         Sensory (Neurological): Neurogenic pain pattern         Assessment   Status Diagnosis  Controlled Controlled Controlled 1. Chronic pain syndrome   2. Lumbar radiculopathy   3. Lumbar facet arthropathy   4. Mononeuropathy      Plan of Care  [name] has a current medication list which includes the following long-term medication(s): atorvastatin, iron, furosemide, metoprolol succinate, potassium chloride, warfarin, [START ON 12/09/2020] oxycodone-acetaminophen, [START ON 01/08/2021] oxycodone-acetaminophen, [START ON 02/07/2021] oxycodone-acetaminophen, and pregabalin.  Pharmacotherapy  (Medications Ordered): Meds ordered this encounter  Medications   oxyCODONE-acetaminophen (PERCOCET) 10-325 MG tablet    Sig: Take 1 tablet by mouth every 6 (six) hours as needed for pain. Must last 30 days.    Dispense:  120 tablet    Refill:  0    Chronic Pain. (STOP Act - Not applicable). Fill one day early if closed on scheduled refill date.   oxyCODONE-acetaminophen (PERCOCET) 10-325 MG tablet    Sig: Take 1 tablet by mouth every 6 (six) hours as needed for pain. Must last 30 days.    Dispense:  120 tablet    Refill:  0    Chronic Pain. (STOP Act - Not applicable). Fill one day early if closed on scheduled refill date.   oxyCODONE-acetaminophen (PERCOCET) 10-325 MG tablet    Sig: Take 1 tablet by mouth every 6 (six) hours as needed for pain. Must last 30 days.  Dispense:  120 tablet    Refill:  0    Chronic Pain. (STOP Act - Not applicable). Fill one day early if closed on scheduled refill date.   pregabalin (LYRICA) 75 MG capsule    Sig: Take 1 capsule (75 mg total) by mouth 2 (two) times daily.    Dispense:  60 capsule    Refill:  6   Follow-up plan:   Return in about 3 months (around 02/20/2021) for Medication Management, in person.   Recent Visits No visits were found meeting these conditions. Showing recent visits within past 90 days and meeting all other requirements Today's Visits Date Type Provider Dept  11/20/20 Office Visit Lance Santa, MD [facility]  Showing today's visits and meeting all other requirements Future Appointments No visits were found meeting these conditions. Showing future appointments within next 90 days and meeting all other requirements I discussed the assessment and treatment plan with the patient. The patient was provided an opportunity to ask questions and all were answered. The patient agreed with the plan and demonstrated an understanding of the instructions.  Patient advised to call back or seek an in-person evaluation if the  symptoms or condition worsens.  Duration of encounter: 43mnutes.  Note by: BGillis Santa MD Date: 11/20/2020; Time: 2:34 PM

## 2020-11-20 NOTE — Progress Notes (Signed)
Nursing Pain Medication Assessment:  Safety precautions to be maintained throughout the outpatient stay will include: orient to surroundings, keep bed in low position, maintain call bell within reach at all times, provide assistance with transfer out of bed and ambulation.  Medication Inspection Compliance: Pill count conducted under aseptic conditions, in front of the patient. Neither the pills nor the bottle was removed from the patient's sight at any time. Once count was completed pills were immediately returned to the patient in their original bottle.  Medication: Oxycodone/APAP Pill/Patch Count:  17 of 120 pills remain Pill/Patch Appearance: Markings consistent with prescribed medication Bottle Appearance: Standard pharmacy container. Clearly labeled. Filled Date: 06 / 04 / 2022 Last Medication intake:  Today

## 2020-11-20 NOTE — Telephone Encounter (Signed)
Prescription for Pregablin written 11-20-20 cancelled. Patient has script dated 08-21-20, instructed pharmacy to fill this one.

## 2020-11-28 LAB — TOXASSURE SELECT 13 (MW), URINE

## 2020-12-05 ENCOUNTER — Encounter: Payer: Self-pay | Admitting: Internal Medicine

## 2020-12-24 ENCOUNTER — Ambulatory Visit (INDEPENDENT_AMBULATORY_CARE_PROVIDER_SITE_OTHER): Payer: PPO

## 2020-12-24 ENCOUNTER — Other Ambulatory Visit: Payer: Self-pay

## 2020-12-24 DIAGNOSIS — Z5181 Encounter for therapeutic drug level monitoring: Secondary | ICD-10-CM | POA: Diagnosis not present

## 2020-12-24 DIAGNOSIS — Z7901 Long term (current) use of anticoagulants: Secondary | ICD-10-CM | POA: Diagnosis not present

## 2020-12-24 DIAGNOSIS — Z952 Presence of prosthetic heart valve: Secondary | ICD-10-CM

## 2020-12-24 DIAGNOSIS — I359 Nonrheumatic aortic valve disorder, unspecified: Secondary | ICD-10-CM

## 2020-12-24 LAB — POCT INR: INR: 3.2 — AB (ref 2.0–3.0)

## 2020-12-24 NOTE — Patient Instructions (Signed)
Continue taking Warfarin 1.5 tablets daily except 2 tablets on Fridays. Recheck INR in 6 weeks. [facility] [phone] & Fax number is [phone]

## 2020-12-27 ENCOUNTER — Ambulatory Visit: Payer: 59 | Admitting: Family Medicine

## 2021-02-05 ENCOUNTER — Ambulatory Visit (INDEPENDENT_AMBULATORY_CARE_PROVIDER_SITE_OTHER): Payer: PPO | Admitting: *Deleted

## 2021-02-05 ENCOUNTER — Other Ambulatory Visit: Payer: Self-pay

## 2021-02-05 DIAGNOSIS — I359 Nonrheumatic aortic valve disorder, unspecified: Secondary | ICD-10-CM | POA: Diagnosis not present

## 2021-02-05 DIAGNOSIS — Z952 Presence of prosthetic heart valve: Secondary | ICD-10-CM | POA: Diagnosis not present

## 2021-02-05 DIAGNOSIS — Z7901 Long term (current) use of anticoagulants: Secondary | ICD-10-CM | POA: Diagnosis not present

## 2021-02-05 DIAGNOSIS — Z5181 Encounter for therapeutic drug level monitoring: Secondary | ICD-10-CM

## 2021-02-05 LAB — POCT INR: INR: 3.6 — AB (ref 2.0–3.0)

## 2021-02-05 NOTE — Patient Instructions (Signed)
Description   Take 1 tablet of warfarin today and then Continue taking Warfarin 1.5 tablets daily except 2 tablets on Fridays. Recheck INR in 6 weeks. [facility] [phone] & Fax number is [phone]

## 2021-02-09 ENCOUNTER — Other Ambulatory Visit: Payer: Self-pay | Admitting: Registered Nurse

## 2021-02-09 ENCOUNTER — Other Ambulatory Visit: Payer: Self-pay | Admitting: Cardiovascular Disease

## 2021-02-09 DIAGNOSIS — E870 Hyperosmolality and hypernatremia: Secondary | ICD-10-CM

## 2021-02-12 ENCOUNTER — Ambulatory Visit (INDEPENDENT_AMBULATORY_CARE_PROVIDER_SITE_OTHER): Payer: PPO | Admitting: Family Medicine

## 2021-02-12 ENCOUNTER — Other Ambulatory Visit: Payer: Self-pay

## 2021-02-12 ENCOUNTER — Encounter: Payer: Self-pay | Admitting: Family Medicine

## 2021-02-12 VITALS — BP 130/78 | HR 56 | Temp 98.5°F | Ht 73.0 in | Wt 269.4 lb

## 2021-02-12 DIAGNOSIS — R82998 Other abnormal findings in urine: Secondary | ICD-10-CM | POA: Diagnosis not present

## 2021-02-12 DIAGNOSIS — I5032 Chronic diastolic (congestive) heart failure: Secondary | ICD-10-CM | POA: Diagnosis not present

## 2021-02-12 DIAGNOSIS — Z7901 Long term (current) use of anticoagulants: Secondary | ICD-10-CM

## 2021-02-12 DIAGNOSIS — R7303 Prediabetes: Secondary | ICD-10-CM

## 2021-02-12 DIAGNOSIS — Z125 Encounter for screening for malignant neoplasm of prostate: Secondary | ICD-10-CM

## 2021-02-12 DIAGNOSIS — R634 Abnormal weight loss: Secondary | ICD-10-CM

## 2021-02-12 DIAGNOSIS — Z7689 Persons encountering health services in other specified circumstances: Secondary | ICD-10-CM | POA: Diagnosis not present

## 2021-02-12 DIAGNOSIS — I10 Essential (primary) hypertension: Secondary | ICD-10-CM

## 2021-02-12 LAB — POCT URINALYSIS DIP (PROADVANTAGE DEVICE)
Bilirubin, UA: NEGATIVE
Blood, UA: NEGATIVE
Glucose, UA: NEGATIVE mg/dL
Leukocytes, UA: NEGATIVE
Nitrite, UA: NEGATIVE
Specific Gravity, Urine: 1.03
Urobilinogen, Ur: 1
pH, UA: 5.5 (ref 5.0–8.0)

## 2021-02-12 NOTE — Progress Notes (Signed)
Subjective:    Patient ID: [name], male    DOB: 1960-10-11, 60 y.o.   MRN: [id number]  HPI Chief Complaint  Patient presents with   other    New pt. Est. Last office he was at closed lost a lot of weight in the past 4 months cut back on his food and has been working outside.    He is new to the practice and here  to establish care.  Previous medical care: [facility] and then Dr. Ardyth Harps and then [facility] Rutherford    Other providers: Cardiologist- Dr. Excell Seltzer  Pain medicine specialist- Dr. Cherylann Ratel  [facility] - Dr. Darrick Penna  Coumadin clinic Pulmonologist- [facility], Elisha Headland, NP   Reports chief concern is weight loss since May his weight has changed from 350 to 269 lbs States he has lost 80 lbs in the past 4 months.  He went from a size 5X and size 56 with his suits to 2X and size 48 pants now  States he has been trying to lose weight but has also tried in the past and could not lose more than 10 lbs. States difference may be due to increase in walking and cutting back on calories.   States his knees do not hurt as bad as in the past.    Drinks several sodas per day, not much water. Does not think his diet is that healthy.   Denies fever, chills, night sweats, dizziness, chest pain, shortness of breath, abdominal pain, N/V/D, urinary symptoms, LE edema.  No polyuria or polydipsia. Denies blurred vision.  Hx of prediabetes   On warfarin for aortic valve replacement and this is managed at the coumadin clinic.  Denies any bleeding concerns.   HTN and cardiac issues managed by his cardiologist.    Social history: Lives with wife, disabled due to back and leg pain.  Former smoker -stopped 33 years ago. denies drinking alcohol, drug use   Health maintenance:   Colonoscopy: 3 years ago per patient Last PSA: unknown   Mother and sister with "male" cancer.  Brother with lung cancer.  2 older brothers had heart disease and diabetes.    Reviewed allergies, medications,  past medical, surgical, family, and social history.    Review of Systems Pertinent positives and negatives in the history of present illness.     Objective:   Physical Exam Constitutional:      General: He is not in acute distress.    Appearance: Normal appearance. He is not ill-appearing or diaphoretic.  Eyes:     Conjunctiva/sclera: Conjunctivae normal.  Cardiovascular:     Rate and Rhythm: Normal rate and regular rhythm.     Pulses: Normal pulses.     Comments: LE edema bilaterally with compression stockings in place (chronic) Pulmonary:     Effort: Pulmonary effort is normal.     Breath sounds: Normal breath sounds.  Abdominal:     General: Abdomen is flat. Bowel sounds are normal.     Palpations: Abdomen is soft.     Tenderness: There is no abdominal tenderness. There is no right CVA tenderness or left CVA tenderness.  Musculoskeletal:        General: Normal range of motion.  Skin:    General: Skin is warm and dry.  Neurological:     General: No focal deficit present.     Mental Status: He is alert and oriented to person, place, and time.  Psychiatric:        Mood  and Affect: Mood normal.        Behavior: Behavior normal.        Thought Content: Thought content normal.   BP 130/78   Pulse (!) 56   Temp 98.5 F (36.9 C)   Ht 6\' 1"  (1.854 m)   Wt 269 lb 6.4 oz (122.2 kg)   BMI 35.54 kg/m       Assessment & Plan:  Excessive weight loss - Plan: CBC with Differential/Platelet, Comprehensive metabolic panel, POCT Urinalysis DIP (Proadvantage Device), PSA, TSH, T4, free, T3, Hemoglobin A1c, HIV Antibody (routine testing w rflx), DG Chest 2 View  Chronic diastolic heart failure (HCC)  Prediabetes - Plan: Comprehensive metabolic panel, TSH, T4, free, T3, Hemoglobin A1c  Dark urine - Plan: POCT Urinalysis DIP (Proadvantage Device)  Encounter to establish care  Screening for prostate cancer - Plan: PSA  Essential hypertension - Plan: CBC with  Differential/Platelet, Comprehensive metabolic panel  Long term current use of anticoagulant  UA pos ket, pro, negative otherwise. Recommend increasing water intake and reducing soda.  He is well appearing today. New to me and here to establish care.  Followed by cardiologist and other specialists.  BP controlled. No sign of fluid overload.  Concerning that he has lost 80 lbs over the past 4-5 months. I will check labs, chest XR and follow up. Encourage a healthy diet and getting adequate calories. We will make sure cancer screenings are UTD.

## 2021-02-13 ENCOUNTER — Ambulatory Visit
Admission: RE | Admit: 2021-02-13 | Discharge: 2021-02-13 | Disposition: A | Discharge status: Home / Self Care | Payer: PPO | Source: Ambulatory Visit | Attending: Family Medicine | Admitting: Family Medicine

## 2021-02-13 DIAGNOSIS — R634 Abnormal weight loss: Secondary | ICD-10-CM

## 2021-02-13 LAB — CBC WITH DIFFERENTIAL/PLATELET
Basophils Absolute: 0 10*3/uL (ref 0.0–0.2)
Basos: 1 %
EOS (ABSOLUTE): 0 10*3/uL (ref 0.0–0.4)
Eos: 1 %
Hematocrit: 36.6 % — ABNORMAL LOW (ref 37.5–51.0)
Hemoglobin: 12 g/dL — ABNORMAL LOW (ref 13.0–17.7)
Immature Grans (Abs): 0 10*3/uL (ref 0.0–0.1)
Immature Granulocytes: 0 %
Lymphocytes Absolute: 1.6 10*3/uL (ref 0.7–3.1)
Lymphs: 24 %
MCH: 28.8 pg (ref 26.6–33.0)
MCHC: 32.8 g/dL (ref 31.5–35.7)
MCV: 88 fL (ref 79–97)
Monocytes Absolute: 0.7 10*3/uL (ref 0.1–0.9)
Monocytes: 11 %
Neutrophils Absolute: 4.2 10*3/uL (ref 1.4–7.0)
Neutrophils: 63 %
Platelets: 231 10*3/uL (ref 150–450)
RBC: 4.16 x10E6/uL (ref 4.14–5.80)
RDW: 13.1 % (ref 11.6–15.4)
WBC: 6.5 10*3/uL (ref 3.4–10.8)

## 2021-02-13 LAB — COMPREHENSIVE METABOLIC PANEL
ALT: 16 IU/L (ref 0–44)
AST: 11 IU/L (ref 0–40)
Albumin/Globulin Ratio: 1.6 (ref 1.2–2.2)
Albumin: 4.3 g/dL (ref 3.8–4.8)
Alkaline Phosphatase: 97 IU/L (ref 44–121)
BUN/Creatinine Ratio: 15 (ref 10–24)
BUN: 12 mg/dL (ref 8–27)
Bilirubin Total: 0.3 mg/dL (ref 0.0–1.2)
CO2: 26 mmol/L (ref 20–29)
Calcium: 9.2 mg/dL (ref 8.6–10.2)
Chloride: 109 mmol/L — ABNORMAL HIGH (ref 96–106)
Creatinine, Ser: 0.79 mg/dL (ref 0.76–1.27)
Globulin, Total: 2.7 g/dL (ref 1.5–4.5)
Glucose: 76 mg/dL (ref 65–99)
Potassium: 3.9 mmol/L (ref 3.5–5.2)
Sodium: 148 mmol/L — ABNORMAL HIGH (ref 134–144)
Total Protein: 7 g/dL (ref 6.0–8.5)
eGFR: 100 mL/min/{1.73_m2} (ref >59)

## 2021-02-13 LAB — PSA: Prostate Specific Ag, Serum: 0.2 ng/mL (ref 0.0–4.0)

## 2021-02-13 LAB — T3: T3, Total: 121 ng/dL (ref 71–180)

## 2021-02-13 LAB — TSH: TSH: 2.42 u[IU]/mL (ref 0.450–4.500)

## 2021-02-13 LAB — T4, FREE: Free T4: 1.01 ng/dL (ref 0.82–1.77)

## 2021-02-13 LAB — HIV ANTIBODY (ROUTINE TESTING W REFLEX): HIV Screen 4th Generation wRfx: NONREACTIVE

## 2021-02-13 LAB — HEMOGLOBIN A1C
Est. average glucose Bld gHb Est-mCnc: 117 mg/dL
Hgb A1c MFr Bld: 5.7 % — ABNORMAL HIGH (ref 4.8–5.6)

## 2021-02-13 NOTE — Progress Notes (Signed)
His labs are stable. Nothing to explain weight loss. Prediabetes and not diabetes per labs. Normal prostate blood test.

## 2021-02-19 ENCOUNTER — Encounter: Payer: Self-pay | Admitting: Student in an Organized Health Care Education/Training Program

## 2021-02-19 ENCOUNTER — Other Ambulatory Visit: Payer: Self-pay

## 2021-02-19 ENCOUNTER — Ambulatory Visit
Payer: PPO | Attending: Student in an Organized Health Care Education/Training Program | Admitting: Student in an Organized Health Care Education/Training Program

## 2021-02-19 VITALS — BP 120/64 | HR 55 | Temp 97.0°F | Resp 14 | Ht 74.0 in | Wt 269.0 lb

## 2021-02-19 DIAGNOSIS — M5416 Radiculopathy, lumbar region: Secondary | ICD-10-CM | POA: Insufficient documentation

## 2021-02-19 DIAGNOSIS — M5136 Other intervertebral disc degeneration, lumbar region: Secondary | ICD-10-CM | POA: Diagnosis not present

## 2021-02-19 DIAGNOSIS — G894 Chronic pain syndrome: Secondary | ICD-10-CM | POA: Insufficient documentation

## 2021-02-19 DIAGNOSIS — M47816 Spondylosis without myelopathy or radiculopathy, lumbar region: Secondary | ICD-10-CM | POA: Diagnosis not present

## 2021-02-19 DIAGNOSIS — G589 Mononeuropathy, unspecified: Secondary | ICD-10-CM | POA: Insufficient documentation

## 2021-02-19 DIAGNOSIS — M1711 Unilateral primary osteoarthritis, right knee: Secondary | ICD-10-CM | POA: Diagnosis not present

## 2021-02-19 MED ORDER — OXYCODONE-ACETAMINOPHEN 10-325 MG PO TABS: 1.0000 | ORAL_TABLET | Freq: Four times a day (QID) | ORAL | 0 refills | Status: DC | PRN

## 2021-02-19 NOTE — Progress Notes (Signed)
Nursing Pain Medication Assessment:  Safety precautions to be maintained throughout the outpatient stay will include: orient to surroundings, keep bed in low position, maintain call bell within reach at all times, provide assistance with transfer out of bed and ambulation.  Medication Inspection Compliance: Pill count conducted under aseptic conditions, in front of the patient. Neither the pills nor the bottle was removed from the patient's sight at any time. Once count was completed pills were immediately returned to the patient in their original bottle.  Medication: Oxycodone/APAP Pill/Patch Count:  38 of 120 pills remain Pill/Patch Appearance: Markings consistent with prescribed medication Bottle Appearance: Standard pharmacy container. Clearly labeled. Filled Date: 09 / 03 / 2022 Last Medication intake:  Today

## 2021-02-19 NOTE — Progress Notes (Signed)
PROVIDER NOTE: Information contained herein reflects review and annotations entered in association with encounter. Interpretation of such information and data should be left to medically-trained personnel. Information provided to patient can be located elsewhere in the medical record under "Patient Instructions". Document created using STT-dictation technology, any transcriptional errors that may result from process are unintentional.    Patient: [name]  Service Category: E/M  Provider: Gillis Santa, MD  DOB: 1961/01/17  DOS: 02/19/2021  Specialty: Interventional Pain Management  MRN: [id number]  Setting: Ambulatory outpatient  PCP: Girtha Rm, NP-C  Type: Established Patient    Referring Provider: No ref. provider found  Location: Office  Delivery: Face-to-face     HPI  [name], a 60 y.o. year old male, is here today because of his Chronic pain syndrome [G89.4]. [name] primary complain today is Back Pain (lower) and Knee Pain (bilateral) Last encounter: My last encounter with him was on 11/20/2020. Pertinent problems: [name] has OBESITY; Obstructive sleep apnea; Aortic valve disorder; S/P aortic valve replacement; Long term current use of anticoagulant; Chronic pain syndrome; Pain in joint, pelvic region and thigh; Pain in right hip; and Lumbar radiculopathy, chronic on their pertinent problem list. Pain Assessment: Severity of Chronic pain is reported as a 5 /10. Location: Back Lower/ . Onset: More than a month ago. Quality: Aching, Throbbing. Timing: Intermittent. Modifying factor(s): sitting/lying down, medications. Vitals:  height is 6' 2" (1.88 m) and weight is 269 lb (122 kg). His temporal temperature is 97 F (36.1 C) (abnormal). His blood pressure is 120/64 and his pulse is 55 (abnormal). His respiration is 14 and oxygen saturation is 100%.   Reason for encounter: medication management.   Patient's pain is at baseline.  Patient continues multimodal  pain regimen as prescribed.  States that it provides pain relief and improvement in functional status. Patient states that he has lost almost 80 pounds.  He has cut out junk food from his diet.  He states that his knee pain is much better.  I encouraged him to keep up dieting and exercise.   Pharmacotherapy Assessment  Analgesic: Percocet 10 mg 4 times daily as needed, quantity 120/month MME equals 60   Monitoring: [place] PMP: PDMP reviewed during this encounter.       Pharmacotherapy: No side-effects or adverse reactions reported. Compliance: No problems identified. Effectiveness: Clinically acceptable.  UDS:  Summary  Date Value Ref Range Status  11/20/2020 Note  Final    Comment:    ==================================================================== [employer] (MW) ==================================================================== Test                             Result       Flag       Units  Drug Present and Declared for Prescription Verification   Oxycodone                      1069         EXPECTED   ng/mg creat   Oxymorphone                    784          EXPECTED   ng/mg creat   Noroxycodone                   2764         EXPECTED   ng/mg creat  Noroxymorphone                 592          EXPECTED   ng/mg creat    Sources of oxycodone are scheduled prescription medications.    Oxymorphone, noroxycodone, and noroxymorphone are expected    metabolites of oxycodone. Oxymorphone is also available as a    scheduled prescription medication.  ==================================================================== Test                      Result    Flag   Units      Ref Range   Creatinine              306              mg/dL      >=20 ==================================================================== Declared Medications:  The flagging and interpretation on this report are based on the  following declared medications.  Unexpected results may arise from  inaccuracies in  the declared medications.   **Note: The testing scope of this panel includes these medications:   Oxycodone (Percocet)   **Note: The testing scope of this panel does not include the  following reported medications:   Acetaminophen (Tylenol)  Acetaminophen (Percocet)  Atorvastatin (Lipitor)  Furosemide (Lasix)  Iron  Metoprolol (Toprol)  Multivitamin (Centrum)  Naloxone (Narcan)  Potassium Chloride  Pregabalin (Lyrica)  Vitamin C  Vitamin D  Warfarin (Coumadin) ==================================================================== For clinical consultation, please call [phone]. ====================================================================       ROS  Constitutional: Denies any fever or chills Gastrointestinal: No reported hemesis, hematochezia, vomiting, or acute GI distress Musculoskeletal:  +LBP Neurological: No reported episodes of acute onset apraxia, aphasia, dysarthria, agnosia, amnesia, paralysis, loss of coordination, or loss of consciousness  Medication Review  Centrum Silver Adult 50+, Iron, Vitamin D, acetaminophen, atorvastatin, furosemide, metoprolol succinate, naloxone, oxyCODONE-acetaminophen, potassium chloride, pregabalin, vitamin C, and warfarin  History Review  Allergy: [name] is allergic to nsaids. Drug: [name]  reports no history of drug use. Alcohol:  reports no history of alcohol use. Tobacco:  reports that he quit smoking about 31 years ago. His smoking use included cigarettes. He has a 1.80 pack-year smoking history. He has never used smokeless tobacco. Social: [name]  reports that he quit smoking about 31 years ago. His smoking use included cigarettes. He has a 1.80 pack-year smoking history. He has never used smokeless tobacco. He reports that he does not drink alcohol and does not use drugs. Medical:  has a past medical history of Ascending aortic aneurysm and dissection, Chest pain, atypical, Coronary artery  reimplantation, Hyperlipidemia, Hypertension, Nuclear stress test, Obesity, Psoriasis, S/P aortic valve and root replacement, Seizures ([place]), and Sleep apnea, obstructive. Surgical: [name]  has a past surgical history that includes Replacement of aortic valve (12/06/2004); Ascending aortic dissection aneurysm (12/06/2004); Knee surgery; Corneal transplant; Endovenous ablation saphenous vein w/ laser (Right, 04/25/2020); and Endovenous ablation saphenous vein w/ laser (Left, 05/23/2020). Family: family history includes Alcohol abuse in his brother and father; Cancer in his brother and father; Cervical cancer in his mother and sister; Coronary artery disease in his mother; Diabetes in his father and mother; Kidney disease in an other family member; Obesity in his father.  Laboratory Chemistry Profile   Renal Lab Results  Component Value Date   BUN 12 02/12/2021   CREATININE 0.79 02/12/2021   BCR 15 02/12/2021   GFRAA 112 06/06/2020   GFRNONAA 97 06/06/2020  Hepatic Lab Results  Component Value Date   AST 11 02/12/2021   ALT 16 02/12/2021   ALBUMIN 4.3 02/12/2021   ALKPHOS 97 02/12/2021   LIPASE 21 07/26/2016     Electrolytes Lab Results  Component Value Date   NA 148 (H) 02/12/2021   K 3.9 02/12/2021   CL 109 (H) 02/12/2021   CALCIUM 9.2 02/12/2021   MG 1.9 03/18/2015     Bone No results found for: VD25OH, VD125OH2TOT, IF0277AJ2, IN8676HM0, 25OHVITD1, 25OHVITD2, 25OHVITD3, TESTOFREE, TESTOSTERONE   Inflammation (CRP: Acute Phase) (ESR: Chronic Phase) No results found for: CRP, ESRSEDRATE, LATICACIDVEN     Note: Above Lab results reviewed.  Recent Imaging Review  DG Chest 2 View CLINICAL DATA:  60 year old male with a history of excessive weight loss  EXAM: CHEST - 2 VIEW  COMPARISON:  06/22/2018  FINDINGS: Cardiomediastinal silhouette unchanged in size and contour. No evidence of central vascular congestion. No interlobular septal thickening.  Surgical  changes of median sternotomy and aortic valve repair.  No pneumothorax or pleural effusion. Coarsened interstitial markings, with no confluent airspace disease.  No acute displaced fracture. Degenerative changes of the spine.  IMPRESSION: Negative for acute cardiopulmonary disease.  Surgical changes of median sternotomy and aortic valve repair  Electronically Signed   By: Corrie Mckusick D.O.   On: 02/14/2021 14:13 Note: Reviewed        Physical Exam  General appearance: Well nourished, well developed, and well hydrated. In no apparent acute distress Mental status: Alert, oriented x 3 (person, place, & time)       Respiratory: No evidence of acute respiratory distress Eyes: PERLA Vitals: BP 120/64   Pulse (!) 55   Temp (!) 97 F (36.1 C) (Temporal)   Resp 14   Ht 6' 2" (1.88 m)   Wt 269 lb (122 kg)   SpO2 100%   BMI 34.54 kg/m  BMI: Estimated body mass index is 34.54 kg/m as calculated from the following:   Height as of this encounter: 6' 2" (1.88 m).   Weight as of this encounter: 269 lb (122 kg). Ideal: Ideal body weight: 82.2 kg (181 lb 3.5 oz) Adjusted ideal body weight: 98.1 kg (216 lb 5.3 oz)  Cervical Spine Exam  Skin & Axial Inspection: No masses, redness, edema, swelling, or associated skin lesions Alignment: Symmetrical Functional ROM: Pain restricted ROM      Stability: No instability detected Muscle Tone/Strength: Functionally intact. No obvious neuro-muscular anomalies detected. Sensory (Neurological): Musculoskeletal pain pattern Palpation: No palpable anomalies                          Upper Extremity (UE) Exam      Side: Right upper extremity   Side: Left upper extremity    Skin & Extremity Inspection: Skin color, temperature, and hair growth are WNL. No peripheral edema or cyanosis. No masses, redness, swelling, asymmetry, or associated skin lesions. No contractures.   Skin & Extremity Inspection: Skin color, temperature, and hair growth are WNL. No  peripheral edema or cyanosis. No masses, redness, swelling, asymmetry, or associated skin lesions. No contractures.    Functional ROM: Unrestricted ROM           Functional ROM: Unrestricted ROM            Muscle Tone/Strength: Functionally intact. No obvious neuro-muscular anomalies detected.   Muscle Tone/Strength: Functionally intact. No obvious neuro-muscular anomalies detected.    Sensory (Neurological): Unimpaired  Sensory (Neurological): Unimpaired              Thoracic Spine Area Exam  Skin & Axial Inspection: No masses, redness, or swelling Alignment: Symmetrical Functional ROM: Decreased ROM Stability: No instability detected Muscle Tone/Strength: Functionally intact. No obvious neuro-muscular anomalies detected. Sensory (Neurological): Musculoskeletal pain pattern Muscle strength & Tone: No palpable anomalies   Lumbar Exam  Skin & Axial Inspection: No masses, redness, or swelling Alignment: Symmetrical Functional ROM: Decreased ROM affecting both sides Stability: No instability detected Muscle Tone/Strength: Functionally intact. No obvious neuro-muscular anomalies detected. Sensory (Neurological): Musculoskeletal pain pattern   Gait & Posture Assessment  Ambulation: Limited Gait: Age-related, antalgic gait pattern Posture: Difficulty standing up straight, due to pain    Lower Extremity Exam      Side: Right lower extremity   Side: Left lower extremity  Stability: No instability observed           Stability: No instability observed          Skin & Extremity Inspection: Edema   Skin & Extremity Inspection: Pitting edema venous stasis changes also noted  Functional ROM: Unrestricted ROM                   Functional ROM: Unrestricted ROM                  Muscle Tone/Strength: Functionally intact. No obvious neuro-muscular anomalies detected.   Muscle Tone/Strength: Functionally intact. No obvious neuro-muscular anomalies detected.  Sensory (Neurological): Neurogenic  pain pattern         Sensory (Neurological): Neurogenic pain pattern         Assessment   Status Diagnosis  Controlled Controlled Controlled 1. Chronic pain syndrome   2. Lumbar radiculopathy   3. Lumbar facet arthropathy   4. Mononeuropathy   5. Primary osteoarthritis of right knee   6. Lumbar spondylosis   7. Lumbar degenerative disc disease      Plan of Care  [name] has a current medication list which includes the following long-term medication(s): atorvastatin, furosemide, metoprolol succinate, potassium chloride, pregabalin, warfarin, iron, [START ON 03/11/2021] oxycodone-acetaminophen, [START ON 04/10/2021] oxycodone-acetaminophen, and [START ON 05/10/2021] oxycodone-acetaminophen.  Pharmacotherapy (Medications Ordered): Meds ordered this encounter  Medications   oxyCODONE-acetaminophen (PERCOCET) 10-325 MG tablet    Sig: Take 1 tablet by mouth every 6 (six) hours as needed for pain. Must last 30 days.    Dispense:  120 tablet    Refill:  0    Chronic Pain. (STOP Act - Not applicable). Fill one day early if closed on scheduled refill date.   oxyCODONE-acetaminophen (PERCOCET) 10-325 MG tablet    Sig: Take 1 tablet by mouth every 6 (six) hours as needed for pain. Must last 30 days.    Dispense:  120 tablet    Refill:  0    Chronic Pain. (STOP Act - Not applicable). Fill one day early if closed on scheduled refill date.   oxyCODONE-acetaminophen (PERCOCET) 10-325 MG tablet    Sig: Take 1 tablet by mouth every 6 (six) hours as needed for pain. Must last 30 days.    Dispense:  120 tablet    Refill:  0    Chronic Pain. (STOP Act - Not applicable). Fill one day early if closed on scheduled refill date.   Encourage patient to continue with dieting and weight loss strategies as this will undoubtedly have a positive impact on his pain.  Follow-up plan:   Return in  about 14 weeks (around 05/28/2021) for Medication Management, in person.   Recent Visits No visits  were found meeting these conditions. Showing recent visits within past 90 days and meeting all other requirements Today's Visits Date Type Provider Dept  02/19/21 Office Visit Gillis Santa, MD [facility]  Showing today's visits and meeting all other requirements Future Appointments No visits were found meeting these conditions. Showing future appointments within next 90 days and meeting all other requirements I discussed the assessment and treatment plan with the patient. The patient was provided an opportunity to ask questions and all were answered. The patient agreed with the plan and demonstrated an understanding of the instructions.  Patient advised to call back or seek an in-person evaluation if the symptoms or condition worsens.  Duration of encounter: 45mnutes.  Note by: BGillis Santa MD Date: 02/19/2021; Time: 2:53 PM

## 2021-03-25 ENCOUNTER — Other Ambulatory Visit: Payer: Self-pay

## 2021-03-25 ENCOUNTER — Telehealth: Payer: Self-pay

## 2021-03-25 ENCOUNTER — Ambulatory Visit (INDEPENDENT_AMBULATORY_CARE_PROVIDER_SITE_OTHER): Payer: PPO

## 2021-03-25 DIAGNOSIS — S80811S Abrasion, right lower leg, sequela: Secondary | ICD-10-CM

## 2021-03-25 DIAGNOSIS — Z7901 Long term (current) use of anticoagulants: Secondary | ICD-10-CM | POA: Diagnosis not present

## 2021-03-25 DIAGNOSIS — F430 Acute stress reaction: Secondary | ICD-10-CM | POA: Diagnosis not present

## 2021-03-25 DIAGNOSIS — I359 Nonrheumatic aortic valve disorder, unspecified: Secondary | ICD-10-CM | POA: Diagnosis not present

## 2021-03-25 DIAGNOSIS — Z952 Presence of prosthetic heart valve: Secondary | ICD-10-CM | POA: Diagnosis not present

## 2021-03-25 DIAGNOSIS — Z5181 Encounter for therapeutic drug level monitoring: Secondary | ICD-10-CM | POA: Diagnosis not present

## 2021-03-25 LAB — POCT INR: INR: 2.3 (ref 2.0–3.0)

## 2021-03-25 NOTE — Patient Instructions (Signed)
Take 2 tablets of warfarin today and then Continue taking Warfarin 1.5 tablets daily except 2 tablets on Fridays. Recheck INR in 6 weeks. [facility] [phone] & Fax number is [phone]

## 2021-03-27 NOTE — Telephone Encounter (Signed)
error 

## 2021-03-28 ENCOUNTER — Telehealth: Payer: Self-pay

## 2021-03-28 MED ORDER — METOPROLOL SUCCINATE ER 50 MG PO TB24: 50.0000 mg | ORAL_TABLET | Freq: Every day | ORAL | 0 refills | Status: DC

## 2021-03-28 NOTE — Telephone Encounter (Signed)
Pt. Called stating he needs a refill on his Metoprolol to [facility] on [address] he was a new pt. Of [place] and just had apt 02/19/21.

## 2021-04-02 ENCOUNTER — Other Ambulatory Visit: Payer: Self-pay | Admitting: Medical

## 2021-04-24 ENCOUNTER — Other Ambulatory Visit: Payer: Self-pay | Admitting: Cardiovascular Disease

## 2021-04-24 DIAGNOSIS — I359 Nonrheumatic aortic valve disorder, unspecified: Secondary | ICD-10-CM

## 2021-04-24 DIAGNOSIS — Z952 Presence of prosthetic heart valve: Secondary | ICD-10-CM

## 2021-04-24 DIAGNOSIS — Z5181 Encounter for therapeutic drug level monitoring: Secondary | ICD-10-CM

## 2021-04-24 DIAGNOSIS — Z7901 Long term (current) use of anticoagulants: Secondary | ICD-10-CM

## 2021-05-08 ENCOUNTER — Other Ambulatory Visit: Payer: Self-pay | Admitting: Registered Nurse

## 2021-05-09 ENCOUNTER — Ambulatory Visit (INDEPENDENT_AMBULATORY_CARE_PROVIDER_SITE_OTHER): Payer: PPO

## 2021-05-09 ENCOUNTER — Other Ambulatory Visit: Payer: Self-pay

## 2021-05-09 DIAGNOSIS — Z7901 Long term (current) use of anticoagulants: Secondary | ICD-10-CM | POA: Diagnosis not present

## 2021-05-09 DIAGNOSIS — I359 Nonrheumatic aortic valve disorder, unspecified: Secondary | ICD-10-CM

## 2021-05-09 DIAGNOSIS — Z5181 Encounter for therapeutic drug level monitoring: Secondary | ICD-10-CM

## 2021-05-09 DIAGNOSIS — Z952 Presence of prosthetic heart valve: Secondary | ICD-10-CM

## 2021-05-09 LAB — POCT INR: INR: 2.2 (ref 2.0–3.0)

## 2021-05-09 NOTE — Patient Instructions (Signed)
Description   Take 2 tablets of warfarin today and then Continue taking Warfarin 1.5 tablets daily except 2 tablets on Fridays. Recheck INR in 6 weeks. [facility] [phone] & Fax number is [phone]

## 2021-05-10 ENCOUNTER — Other Ambulatory Visit: Payer: Self-pay | Admitting: Registered Nurse

## 2021-05-14 ENCOUNTER — Ambulatory Visit (INDEPENDENT_AMBULATORY_CARE_PROVIDER_SITE_OTHER): Payer: PPO | Admitting: Cardiovascular Disease

## 2021-05-14 ENCOUNTER — Encounter: Payer: Self-pay | Admitting: Cardiovascular Disease

## 2021-05-14 ENCOUNTER — Other Ambulatory Visit: Payer: Self-pay

## 2021-05-14 VITALS — BP 144/69 | HR 51 | Ht 74.0 in | Wt 277.0 lb

## 2021-05-14 DIAGNOSIS — I480 Paroxysmal atrial fibrillation: Secondary | ICD-10-CM

## 2021-05-14 DIAGNOSIS — I5032 Chronic diastolic (congestive) heart failure: Secondary | ICD-10-CM | POA: Diagnosis not present

## 2021-05-14 DIAGNOSIS — I10 Essential (primary) hypertension: Secondary | ICD-10-CM | POA: Diagnosis not present

## 2021-05-14 DIAGNOSIS — E870 Hyperosmolality and hypernatremia: Secondary | ICD-10-CM | POA: Diagnosis not present

## 2021-05-14 DIAGNOSIS — E782 Mixed hyperlipidemia: Secondary | ICD-10-CM

## 2021-05-14 DIAGNOSIS — Z7901 Long term (current) use of anticoagulants: Secondary | ICD-10-CM | POA: Diagnosis not present

## 2021-05-14 DIAGNOSIS — I359 Nonrheumatic aortic valve disorder, unspecified: Secondary | ICD-10-CM

## 2021-05-14 MED ORDER — FUROSEMIDE 40 MG PO TABS: 40.0000 mg | ORAL_TABLET | Freq: Two times a day (BID) | ORAL | 3 refills | Status: DC

## 2021-05-14 MED ORDER — POTASSIUM CHLORIDE ER 10 MEQ PO CPCR: 10.0000 meq | ORAL_CAPSULE | Freq: Every day | ORAL | 3 refills | Status: DC

## 2021-05-14 NOTE — Progress Notes (Signed)
Cardiology Office Note:    Date:  05/14/2021   ID:  [name], DOB January 24, 1961, MRN [id number]  PCP:  [facility] [place] [facility] Cardiologist:  Sherren Mocha, MD     Referring MD: [facility], [place]   Chief Complaint  Patient presents with   Follow-up    S/P AVR     History of Present Illness:    [name] is a 60 y.o. male with a hx of thoracic aortic aneurysm and aortic valve disease status post mechanical aortic valve replacement, thoracic aortic aneurysm repair, and coronary artery reimplantation in 2006.  Other problems include paroxysmal atrial fibrillation, chronic diastolic heart failure, hypertension, hyperlipidemia, morbid obesity, venous insufficiency, and obstructive sleep apnea on CPAP.  The patient is here alone today. He has lost a lot of weight over the past year. His weight was as high as 351# in 2021, now down to 277#.  He feels much better with the weight loss, getting around much easier and having less pain in his joints. From a cardiac perspective he is doing well. Today, he denies symptoms of palpitations, chest pain, shortness of breath, orthopnea, PND, lower extremity edema, dizziness, or syncope.he denies bleeding problems on long-term warfarin.   Past Medical History:  Diagnosis Date   Ascending aortic aneurysm and dissection    2006 repaired  7 cm aneurysm   Chest pain, atypical    Coronary artery reimplantation    Hyperlipidemia    Mixed   Hypertension    Unspecified   Nuclear stress test    Myoview 08/2018:  EF 58, no scar or ischemia; Low Risk   Obesity    Psoriasis    S/P aortic valve and root replacement    HX of St. Jude   Seizures ([place])    one time incident 2018   Sleep apnea, obstructive     Past Surgical History:  Procedure Laterality Date   Ascending aortic dissection aneurysm  12/06/2004   ok for MRI 1.5 or 3T Using a 27-mm St. Jude mechanical valve conduit with  reimplantation of both coronary arteries   CORNEAL TRANSPLANT     R    ENDOVENOUS ABLATION SAPHENOUS VEIN W/ LASER Right 04/25/2020   endovenous laser ablation right small saphenous vein by Ruta Hinds MD    ENDOVENOUS ABLATION SAPHENOUS VEIN W/ LASER Left 05/23/2020   endovenous laser ablation left small saphenous vein by Ruta Hinds MD    KNEE SURGERY     R   Replacement of aortic valve  12/06/2004    Current Medications: Current Meds  Medication Sig   atorvastatin (LIPITOR) 20 MG tablet Take 1 tablet by mouth once daily   Cholecalciferol (VITAMIN D) 50 MCG (2000 UT) tablet Take 1 tablet (2,000 Units total) by mouth 2 (two) times daily.   metoprolol succinate (TOPROL-XL) 50 MG 24 hr tablet Take 1 tablet (50 mg total) by mouth daily.   Multiple Vitamins-Minerals (CENTRUM SILVER ADULT 50+) TABS 1 qd   NARCAN 4 MG/0.1ML LIQD nasal spray kit 1 spray once.   oxyCODONE-acetaminophen (PERCOCET) 10-325 MG tablet Take 1 tablet by mouth every 6 (six) hours as needed for pain. Must last 30 days.   pregabalin (LYRICA) 75 MG capsule Take 1 capsule (75 mg total) by mouth 2 (two) times daily.   vitamin C (ASCORBIC ACID) 500 MG tablet Take 1,000 mg by mouth daily.    warfarin (COUMADIN) 5 MG tablet TAKE 1 & 1/2 TO  2 (ONE & ONE-HALF TO TWO) TABLETS BY MOUTH ONCE DAILY AS DIRECTED BY  COUMADIN  CLINIC   [DISCONTINUED] furosemide (LASIX) 40 MG tablet Take 1 tablet by mouth twice daily   [DISCONTINUED] potassium chloride (MICRO-K) 10 MEQ CR capsule Take 1 capsule by mouth once daily     Allergies:   Nsaids   Social History   Socioeconomic History   Marital status: Married    Spouse name: Not on file   Number of children: 0   Years of education: 12   Highest education level: Not on file  Occupational History   Occupation: MIXER/PACKER    Employer: [employer]    Comment: works at [employer]- inhales vapors  Tobacco Use   Smoking status: Former    Packs/day: 0.10    Years:  18.00    Pack years: 1.80    Types: Cigarettes    Quit date: 12/07/1989    Years since quitting: 31.4   Smokeless tobacco: Never  Vaping Use   Vaping Use: Never used  Substance and Sexual Activity   Alcohol use: No    Comment: Quit 1991   Drug use: No   Sexual activity: Not on file  Other Topics Concern   Not on file  Social History Narrative   Married   No regular exercise   Currently disabled      Patient drinks 2 cups of coffee a day    Patient is right handed.    Social Determinants of Health   Financial Resource Strain: Not on file  Food Insecurity: Not on file  Transportation Needs: Not on file  Physical Activity: Not on file  Stress: Not on file  Social Connections: Not on file     Family History: The patient's family history includes Alcohol abuse in his brother and father; Cancer in his brother and father; Cervical cancer in his mother and sister; Coronary artery disease in his mother; Diabetes in his father and mother; Kidney disease in an other family member; Obesity in his father.  ROS:   Please see the history of present illness.    All other systems reviewed and are negative.  EKGs/Labs/Other Studies Reviewed:    The following studies were reviewed today: Echo 01/20/2018: HPI and indications: Aortic valve replacement (22m) St. Jude    mechanical valve.  - Left ventricle: The cavity size was normal. There was severe    concentric hypertrophy. Systolic function was normal. The    estimated ejection fraction was in the range of 60% to 65%. Wall    motion was normal; there were no regional wall motion    abnormalities. Doppler parameters are consistent with abnormal    left ventricular relaxation (grade 1 diastolic dysfunction).  - Aortic valve: A mechanical prosthesis was present and functioning    normally. Peak velocity (S): 331 cm/s. Mean gradient (S): 21 mm    Hg. Valve area (VTI): 1.26 cm^2. Valve area (Vmax): 1.08 cm^2.    Valve area (Vmean): 1.05  cm^2.  - Left atrium: The atrium was moderately dilated.  - Right atrium: The atrium was severely dilated.  - Tricuspid valve: There was trivial regurgitation.  - Pulmonary arteries: Systolic pressure was mildly increased. PA    peak pressure: 32 mm Hg (S).   EKG:  EKG is ordered today.  The ekg ordered today demonstrates sinus bradycardia 51 bpm, first-degree AV block, otherwise normal.  Recent Labs: 02/12/2021: ALT 16; BUN 12; Creatinine, Ser 0.79; Hemoglobin 12.0;  Platelets 231; Potassium 3.9; Sodium 148; TSH 2.420  Recent Lipid Panel    Component Value Date/Time   CHOL 171 03/01/2020 1102   TRIG 139 03/01/2020 1102   HDL 41 03/01/2020 1102   CHOLHDL 4.2 03/01/2020 1102   LDLCALC 105 (H) 03/01/2020 1102     Risk Assessment/Calculations:    CHA2DS2-VASc Score = 3   This indicates a 3.2% annual risk of stroke. The patient's score is based upon: CHF History: 1 HTN History: 1 Diabetes History: 1 Stroke History: 0 Vascular Disease History: 0 Age Score: 0 Gender Score: 0         Physical Exam:    VS:  BP (!) 144/69   Pulse (!) 51   Ht _0  (1.88 m)   Wt 277 lb (125.6 kg)   SpO2 98%   BMI 35.56 kg/m     Wt Readings from Last 3 Encounters:  05/14/21 277 lb (125.6 kg)  02/19/21 269 lb (122 kg)  02/12/21 269 lb 6.4 oz (122.2 kg)     GEN:  Well nourished, well developed in no acute distress HEENT: Normal NECK: No JVD; No carotid bruits LYMPHATICS: No lymphadenopathy CARDIAC: RRR, 2/6 systolic murmur at the right upper sternal border, loud mechanical A2, crisp RESPIRATORY:  Clear to auscultation without rales, wheezing or rhonchi  ABDOMEN: Soft, non-tender, non-distended MUSCULOSKELETAL: 1+ bilateral pretibial edema; No deformity  SKIN: Warm and dry, chronic stasis changes noted NEUROLOGIC:  Alert and oriented x 3 PSYCHIATRIC:  Normal affect   ASSESSMENT:    1. Aortic valve disorder   2. Long term current use of anticoagulant   3. PAF (paroxysmal atrial  fibrillation) ([place])   4. Chronic diastolic heart failure ([place])   5. Essential hypertension   6. Mixed hyperlipidemia   7. Hypernatremia    PLAN:    In order of problems listed above:  The patient has New York Heart Association functional class I symptoms.  His mechanical aortic valve function has been normal by echo last checked in 2019.  I would like him to have a repeat echo next year prior to his office visit.  He has done well on long-term warfarin with no bleeding problems. No bleeding problems reported.  Continue warfarin. Maintaining sinus rhythm, continue metoprolol succinate Volume status appears good as patient is euvolemic.  He does have chronic stasis changes and lower extremity edema primarily related to venous insufficiency.  The patient has lost 80 pounds and I am sure is benefiting greatly from this. Blood pressure well controlled.  Most recent labs reviewed with a creatinine of 0.79. Treated with atorvastatin 20 mg daily.  Recent LFTs within normal limits.     Medication Adjustments/Labs and Tests Ordered: Current medicines are reviewed at length with the patient today.  Concerns regarding medicines are outlined above.  Orders Placed This Encounter  Procedures   EKG 12-Lead   ECHOCARDIOGRAM COMPLETE    Meds ordered this encounter  Medications   potassium chloride (MICRO-K) 10 MEQ CR capsule    Sig: Take 1 capsule (10 mEq total) by mouth daily.    Dispense:  90 capsule    Refill:  3   furosemide (LASIX) 40 MG tablet    Sig: Take 1 tablet (40 mg total) by mouth 2 (two) times daily.    Dispense:  180 tablet    Refill:  3     Patient Instructions  Medication Instructions:  Your physician recommends that you continue on your current medications as directed. Please  refer to the Current Medication list given to you today.  *If you need a refill on your cardiac medications before your next appointment, please call your pharmacy*   Testing/Procedures: Your  physician has requested that you have an echocardiogram in one year. Echocardiography is a painless test that uses sound waves to create images of your heart. It provides your doctor with information about the size and shape of your heart and how well your heart's chambers and valves are working. This procedure takes approximately one hour. There are no restrictions for this procedure.   Follow-Up: At [facility], you and your health needs are our priority.  As part of our continuing mission to provide you with exceptional heart care, we have created designated Provider Care Teams.  These Care Teams include your primary Cardiologist (physician) and Advanced Practice Providers (APPs -  Physician Assistants and Nurse Practitioners) who all work together to provide you with the care you need, when you need it.  Your next appointment:   1 year(s)  The format for your next appointment:   In Person  Provider:   Sherren Mocha, MD       Signed, Sherren Mocha, MD  05/14/2021 3:21 PM    [place]

## 2021-05-14 NOTE — Patient Instructions (Signed)
Medication Instructions:  Your physician recommends that you continue on your current medications as directed. Please refer to the Current Medication list given to you today.  *If you need a refill on your cardiac medications before your next appointment, please call your pharmacy*  Testing/Procedures: Your physician has requested that you have an echocardiogram in one year. Echocardiography is a painless test that uses sound waves to create images of your heart. It provides your doctor with information about the size and shape of your heart and how well your heart's chambers and valves are working. This procedure takes approximately one hour. There are no restrictions for this procedure.   Follow-Up: At [facility], you and your health needs are our priority.  As part of our continuing mission to provide you with exceptional heart care, we have created designated Provider Care Teams.  These Care Teams include your primary Cardiologist (physician) and Advanced Practice Providers (APPs -  Physician Assistants and Nurse Practitioners) who all work together to provide you with the care you need, when you need it.  Your next appointment:   1 year(s)  The format for your next appointment:   In Person  Provider:   Michael Cooper, MD     

## 2021-05-22 ENCOUNTER — Encounter: Payer: PPO | Admitting: Student in an Organized Health Care Education/Training Program

## 2021-05-23 ENCOUNTER — Encounter: Payer: PPO | Admitting: Student in an Organized Health Care Education/Training Program

## 2021-06-13 ENCOUNTER — Encounter: Payer: Self-pay | Admitting: Student in an Organized Health Care Education/Training Program

## 2021-06-13 ENCOUNTER — Other Ambulatory Visit: Payer: Self-pay

## 2021-06-13 ENCOUNTER — Ambulatory Visit
Payer: Medicare HMO | Attending: Student in an Organized Health Care Education/Training Program | Admitting: Student in an Organized Health Care Education/Training Program

## 2021-06-13 VITALS — BP 168/81 | HR 53 | Temp 97.1°F | Resp 16 | Ht 73.5 in | Wt 272.2 lb

## 2021-06-13 DIAGNOSIS — M1612 Unilateral primary osteoarthritis, left hip: Secondary | ICD-10-CM | POA: Insufficient documentation

## 2021-06-13 DIAGNOSIS — M4726 Other spondylosis with radiculopathy, lumbar region: Secondary | ICD-10-CM

## 2021-06-13 DIAGNOSIS — G589 Mononeuropathy, unspecified: Secondary | ICD-10-CM | POA: Diagnosis not present

## 2021-06-13 DIAGNOSIS — M4696 Unspecified inflammatory spondylopathy, lumbar region: Secondary | ICD-10-CM | POA: Diagnosis not present

## 2021-06-13 DIAGNOSIS — M5136 Other intervertebral disc degeneration, lumbar region: Secondary | ICD-10-CM | POA: Diagnosis not present

## 2021-06-13 DIAGNOSIS — M1712 Unilateral primary osteoarthritis, left knee: Secondary | ICD-10-CM | POA: Diagnosis not present

## 2021-06-13 DIAGNOSIS — M47816 Spondylosis without myelopathy or radiculopathy, lumbar region: Secondary | ICD-10-CM | POA: Diagnosis not present

## 2021-06-13 DIAGNOSIS — M1711 Unilateral primary osteoarthritis, right knee: Secondary | ICD-10-CM | POA: Diagnosis not present

## 2021-06-13 DIAGNOSIS — G894 Chronic pain syndrome: Secondary | ICD-10-CM | POA: Diagnosis not present

## 2021-06-13 DIAGNOSIS — M5416 Radiculopathy, lumbar region: Secondary | ICD-10-CM | POA: Diagnosis not present

## 2021-06-13 DIAGNOSIS — M170 Bilateral primary osteoarthritis of knee: Secondary | ICD-10-CM | POA: Diagnosis not present

## 2021-06-13 MED ORDER — OXYCODONE-ACETAMINOPHEN 10-325 MG PO TABS: 1.0000 | ORAL_TABLET | Freq: Four times a day (QID) | ORAL | 0 refills | Status: DC | PRN

## 2021-06-13 NOTE — Progress Notes (Signed)
Nursing Pain Medication Assessment:  Safety precautions to be maintained throughout the outpatient stay will include: orient to surroundings, keep bed in low position, maintain call bell within reach at all times, provide assistance with transfer out of bed and ambulation.  Medication Inspection Compliance: Pill count conducted under aseptic conditions, in front of the patient. Neither the pills nor the bottle was removed from the patient's sight at any time. Once count was completed pills were immediately returned to the patient in their original bottle.  Medication: Oxycodone/APAP Pill/Patch Count:  15 of 28 pills remain Pill/Patch Appearance: Markings consistent with prescribed medication Bottle Appearance: Standard pharmacy container. Clearly labeled. Filled Date: 01 / 02 / 2023 Last Medication intake:  Today

## 2021-06-13 NOTE — Progress Notes (Signed)
PROVIDER NOTE: Information contained herein reflects review and annotations entered in association with encounter. Interpretation of such information and data should be left to medically-trained personnel. Information provided to patient can be located elsewhere in the medical record under "Patient Instructions". Document created using STT-dictation technology, any transcriptional errors that may result from process are unintentional.    Patient: [name]  Service Category: E/M  Provider: Gillis Santa, MD  DOB: 1960/11/12  DOS: 06/13/2021  Specialty: Interventional Pain Management  MRN: [id number]  Setting: Ambulatory outpatient  PCP: Irene Pap, PA-C  Type: Established Patient    Referring Provider: Marcellina Millin  Location: Office  Delivery: Face-to-face     HPI  [name], a 61 y.o. year old male, is here today because of his Chronic pain syndrome [G89.4]. [name]'s primary complain today is Back Pain (Lumbar left side )  Last encounter: My last encounter with him was on 02/19/21  Pertinent problems: [name] has OBESITY; Obstructive sleep apnea; Aortic valve disorder; S/P aortic valve replacement; Long term current use of anticoagulant; Chronic pain syndrome; Pain in joint, pelvic region and thigh; Pain in right hip; and Lumbar radiculopathy, chronic on their pertinent problem list. Pain Assessment: Severity of Chronic pain is reported as a 5 /10. Location: Back Lower, Left/down the left leg. Onset: More than a month ago. Quality: Discomfort, Constant, Other (Comment), Pressure (pulling pain when he walks). Timing: Constant. Modifying factor(s): medications, rest. Vitals:  height is 6' 1.5" (1.867 m) and weight is 277 lb (125.6 kg). His temporal temperature is 97.1 F (36.2 C) (abnormal). His blood pressure is 168/81 (abnormal) and his pulse is 53 (abnormal). His respiration is 16 and oxygen saturation is 100%.   Reason for encounter: medication  management.    Patient's pain is at baseline.  Patient continues multimodal pain regimen as prescribed.  States that it provides pain relief and improvement in functional status. Follows up today for medication management.  Unfortunately his pharmacy only gave him 7 days worth of medication when it was filled on 06/10/2021.  He is due for refill on June 17, 2021. Since starting with me, he has lost approximately 80 pounds.  He has cut out junk food from his diet.  He states that his knee pain is much better.  I encouraged him to keep up dieting and exercise.   Pharmacotherapy Assessment  Analgesic: Percocet 10 mg 4 times daily as needed, quantity 120/month MME equals 60   Monitoring: [place] PMP: PDMP reviewed during this encounter.       Pharmacotherapy: No side-effects or adverse reactions reported. Compliance: No problems identified. Effectiveness: Clinically acceptable.  UDS:  Summary  Date Value Ref Range Status  11/20/2020 Note  Final    Comment:    ==================================================================== [facility] (MW) ==================================================================== Test                             Result       Flag       Units  Drug Present and Declared for Prescription Verification   Oxycodone                      1069         EXPECTED   ng/mg creat   Oxymorphone                    784  EXPECTED   ng/mg creat   Noroxycodone                   2764         EXPECTED   ng/mg creat   Noroxymorphone                 592          EXPECTED   ng/mg creat    Sources of oxycodone are scheduled prescription medications.    Oxymorphone, noroxycodone, and noroxymorphone are expected    metabolites of oxycodone. Oxymorphone is also available as a    scheduled prescription medication.  ==================================================================== Test                      Result    Flag   Units      Ref Range   Creatinine               306              mg/dL      >=20 ==================================================================== Declared Medications:  The flagging and interpretation on this report are based on the  following declared medications.  Unexpected results may arise from  inaccuracies in the declared medications.   **Note: The testing scope of this panel includes these medications:   Oxycodone (Percocet)   **Note: The testing scope of this panel does not include the  following reported medications:   Acetaminophen (Tylenol)  Acetaminophen (Percocet)  Atorvastatin (Lipitor)  Furosemide (Lasix)  Iron  Metoprolol (Toprol)  Multivitamin (Centrum)  Naloxone (Narcan)  Potassium Chloride  Pregabalin (Lyrica)  Vitamin C  Vitamin D  Warfarin (Coumadin) ==================================================================== For clinical consultation, please call [phone]. ====================================================================       ROS  Constitutional: Denies any fever or chills Gastrointestinal: No reported hemesis, hematochezia, vomiting, or acute GI distress Musculoskeletal:  +LBP Neurological: No reported episodes of acute onset apraxia, aphasia, dysarthria, agnosia, amnesia, paralysis, loss of coordination, or loss of consciousness  Medication Review  Centrum Silver Adult 50+, Vitamin D, atorvastatin, furosemide, metoprolol succinate, naloxone, oxyCODONE-acetaminophen, potassium chloride, pregabalin, vitamin C, and warfarin  History Review  Allergy: [name] is allergic to nsaids. Drug: [name]  reports no history of drug use. Alcohol:  reports no history of alcohol use. Tobacco:  reports that he quit smoking about 31 years ago. His smoking use included cigarettes. He has a 1.80 pack-year smoking history. He has never used smokeless tobacco. Social: [name]  reports that he quit smoking about 31 years ago. His smoking use included cigarettes. He has a 1.80  pack-year smoking history. He has never used smokeless tobacco. He reports that he does not drink alcohol and does not use drugs. Medical:  has a past medical history of Ascending aortic aneurysm and dissection, Chest pain, atypical, Coronary artery reimplantation, Hyperlipidemia, Hypertension, Nuclear stress test, Obesity, Psoriasis, S/P aortic valve and root replacement, Seizures ([place]), and Sleep apnea, obstructive. Surgical: [name]  has a past surgical history that includes Replacement of aortic valve (12/06/2004); Ascending aortic dissection aneurysm (12/06/2004); Knee surgery; Corneal transplant; Endovenous ablation saphenous vein w/ laser (Right, 04/25/2020); and Endovenous ablation saphenous vein w/ laser (Left, 05/23/2020). Family: family history includes Alcohol abuse in his brother and father; Cancer in his brother and father; Cervical cancer in his mother and sister; Coronary artery disease in his mother; Diabetes in his father and mother; Kidney disease in an other family member; Obesity in his father.  Laboratory Chemistry Profile   Renal Lab Results  Component Value Date   BUN 12 02/12/2021   CREATININE 0.79 02/12/2021   BCR 15 02/12/2021   GFRAA 112 06/06/2020   GFRNONAA 97 06/06/2020     Hepatic Lab Results  Component Value Date   AST 11 02/12/2021   ALT 16 02/12/2021   ALBUMIN 4.3 02/12/2021   ALKPHOS 97 02/12/2021   LIPASE 21 07/26/2016     Electrolytes Lab Results  Component Value Date   NA 148 (H) 02/12/2021   K 3.9 02/12/2021   CL 109 (H) 02/12/2021   CALCIUM 9.2 02/12/2021   MG 1.9 03/18/2015     Bone No results found for: VD25OH, VD125OH2TOT, KD3267TI4, PY0998PJ8, 25OHVITD1, 25OHVITD2, 25OHVITD3, TESTOFREE, TESTOSTERONE   Inflammation (CRP: Acute Phase) (ESR: Chronic Phase) No results found for: CRP, ESRSEDRATE, LATICACIDVEN     Note: Above Lab results reviewed.  Recent Imaging Review  DG Chest 2 View CLINICAL DATA:  61 year old male with a  history of excessive weight loss  EXAM: CHEST - 2 VIEW  COMPARISON:  06/22/2018  FINDINGS: Cardiomediastinal silhouette unchanged in size and contour. No evidence of central vascular congestion. No interlobular septal thickening.  Surgical changes of median sternotomy and aortic valve repair.  No pneumothorax or pleural effusion. Coarsened interstitial markings, with no confluent airspace disease.  No acute displaced fracture. Degenerative changes of the spine.  IMPRESSION: Negative for acute cardiopulmonary disease.  Surgical changes of median sternotomy and aortic valve repair  Electronically Signed   By: Corrie Mckusick D.O.   On: 02/14/2021 14:13  Note: Reviewed        Physical Exam  General appearance: Well nourished, well developed, and well hydrated. In no apparent acute distress Mental status: Alert, oriented x 3 (person, place, & time)       Respiratory: No evidence of acute respiratory distress Eyes: PERLA Vitals: BP (!) 168/81 (BP Location: Left Arm, Patient Position: Sitting, Cuff Size: Large)    Pulse (!) 53    Temp (!) 97.1 F (36.2 C) (Temporal)    Resp 16    Ht 6' 1.5" (1.867 m)    Wt 277 lb (125.6 kg)    SpO2 100%    BMI 36.05 kg/m  BMI: Estimated body mass index is 36.05 kg/m as calculated from the following:   Height as of this encounter: 6' 1.5" (1.867 m).   Weight as of this encounter: 277 lb (125.6 kg). Ideal: Ideal body weight: 81 kg (178 lb 10.9 oz) Adjusted ideal body weight: 98.9 kg (218 lb 0.1 oz)  Cervical Spine Exam  Skin & Axial Inspection: No masses, redness, edema, swelling, or associated skin lesions Alignment: Symmetrical Functional ROM: Pain restricted ROM      Stability: No instability detected Muscle Tone/Strength: Functionally intact. No obvious neuro-muscular anomalies detected. Sensory (Neurological): Musculoskeletal pain pattern Palpation: No palpable anomalies                          Upper Extremity (UE) Exam      Side:  Right upper extremity   Side: Left upper extremity    Skin & Extremity Inspection: Skin color, temperature, and hair growth are WNL. No peripheral edema or cyanosis. No masses, redness, swelling, asymmetry, or associated skin lesions. No contractures.   Skin & Extremity Inspection: Skin color, temperature, and hair growth are WNL. No peripheral edema or cyanosis. No masses, redness, swelling, asymmetry, or associated skin lesions. No contractures.  Functional ROM: Unrestricted ROM           Functional ROM: Unrestricted ROM            Muscle Tone/Strength: Functionally intact. No obvious neuro-muscular anomalies detected.   Muscle Tone/Strength: Functionally intact. No obvious neuro-muscular anomalies detected.    Sensory (Neurological): Unimpaired           Sensory (Neurological): Unimpaired              Thoracic Spine Area Exam  Skin & Axial Inspection: No masses, redness, or swelling Alignment: Symmetrical Functional ROM: Decreased ROM Stability: No instability detected Muscle Tone/Strength: Functionally intact. No obvious neuro-muscular anomalies detected. Sensory (Neurological): Musculoskeletal pain pattern Muscle strength & Tone: No palpable anomalies   Lumbar Exam  Skin & Axial Inspection: No masses, redness, or swelling Alignment: Symmetrical Functional ROM: Decreased ROM affecting both sides Stability: No instability detected Muscle Tone/Strength: Functionally intact. No obvious neuro-muscular anomalies detected. Sensory (Neurological): Musculoskeletal pain pattern   Gait & Posture Assessment  Ambulation: Limited Gait: Age-related, antalgic gait pattern Posture: Difficulty standing up straight, due to pain    Lower Extremity Exam      Side: Right lower extremity   Side: Left lower extremity  Stability: No instability observed           Stability: No instability observed          Skin & Extremity Inspection: Edema   Skin & Extremity Inspection: Pitting edema venous stasis  changes also noted  Functional ROM: Unrestricted ROM                   Functional ROM: Unrestricted ROM                  Muscle Tone/Strength: Functionally intact. No obvious neuro-muscular anomalies detected.   Muscle Tone/Strength: Functionally intact. No obvious neuro-muscular anomalies detected.  Sensory (Neurological): Neurogenic pain pattern         Sensory (Neurological): Neurogenic pain pattern         Assessment   Status Diagnosis  Controlled Controlled Controlled 1. Chronic pain syndrome   2. Lumbar radiculopathy   3. Lumbar facet arthropathy   4. Mononeuropathy   5. Primary osteoarthritis of right knee   6. Lumbar spondylosis   7. Lumbar degenerative disc disease   8. Primary osteoarthritis of left hip   9. Primary osteoarthritis of left knee       Plan of Care  [name] has a current medication list which includes the following long-term medication(s): atorvastatin, furosemide, metoprolol succinate, potassium chloride, pregabalin, warfarin, [START ON 06/16/2021] oxycodone-acetaminophen, [START ON 07/16/2021] oxycodone-acetaminophen, and [START ON 08/15/2021] oxycodone-acetaminophen.  Pharmacotherapy (Medications Ordered): Meds ordered this encounter  Medications   oxyCODONE-acetaminophen (PERCOCET) 10-325 MG tablet    Sig: Take 1 tablet by mouth every 6 (six) hours as needed for pain. Must last 30 days.    Dispense:  120 tablet    Refill:  0    Chronic Pain. (STOP Act - Not applicable). Fill one day early if closed on scheduled refill date.   oxyCODONE-acetaminophen (PERCOCET) 10-325 MG tablet    Sig: Take 1 tablet by mouth every 6 (six) hours as needed for pain. Must last 30 days.    Dispense:  120 tablet    Refill:  0    Chronic Pain. (STOP Act - Not applicable). Fill one day early if closed on scheduled refill date.   oxyCODONE-acetaminophen (PERCOCET) 10-325 MG tablet  Sig: Take 1 tablet by mouth every 6 (six) hours as needed for pain. Must last 30  days.    Dispense:  120 tablet    Refill:  0    Chronic Pain. (STOP Act - Not applicable). Fill one day early if closed on scheduled refill date.   Encourage patient to continue with dieting and weight loss strategies as this will undoubtedly have a positive impact on his pain.  Follow-up plan:   Return in about 3 months (around 09/11/2021) for Medication Management, in person.   Recent Visits No visits were found meeting these conditions. Showing recent visits within past 90 days and meeting all other requirements Today's Visits Date Type Provider Dept  06/13/21 Office Visit Gillis Santa, MD [facility]  Showing today's visits and meeting all other requirements Future Appointments No visits were found meeting these conditions. Showing future appointments within next 90 days and meeting all other requirements  I discussed the assessment and treatment plan with the patient. The patient was provided an opportunity to ask questions and all were answered. The patient agreed with the plan and demonstrated an understanding of the instructions.  Patient advised to call back or seek an in-person evaluation if the symptoms or condition worsens.  Duration of encounter: 5mnutes.  Note by: BGillis Santa MD Date: 06/13/2021; Time: 2:16 PM

## 2021-06-17 ENCOUNTER — Telehealth: Payer: Self-pay

## 2021-06-17 DIAGNOSIS — G589 Mononeuropathy, unspecified: Secondary | ICD-10-CM

## 2021-06-17 DIAGNOSIS — G894 Chronic pain syndrome: Secondary | ICD-10-CM

## 2021-06-17 DIAGNOSIS — M5416 Radiculopathy, lumbar region: Secondary | ICD-10-CM

## 2021-06-17 DIAGNOSIS — M47816 Spondylosis without myelopathy or radiculopathy, lumbar region: Secondary | ICD-10-CM

## 2021-06-17 MED ORDER — PREGABALIN 75 MG PO CAPS: 75.0000 mg | ORAL_CAPSULE | Freq: Two times a day (BID) | ORAL | 6 refills | Status: DC

## 2021-06-17 NOTE — Telephone Encounter (Signed)
The pharmacy didn't receive the script for pregabalin. He was supposed to send along with the oxycodone. He wants someone to call him and let him know it was done.

## 2021-06-17 NOTE — Telephone Encounter (Signed)
Patient notified per voicemail that script for Pregabalin has been sent to pharmacy.

## 2021-06-17 NOTE — Telephone Encounter (Signed)
Needs Pregabalin. Last prescribed 11/2020 with 6 refills.

## 2021-06-20 ENCOUNTER — Other Ambulatory Visit: Payer: Self-pay

## 2021-06-20 ENCOUNTER — Ambulatory Visit (INDEPENDENT_AMBULATORY_CARE_PROVIDER_SITE_OTHER): Payer: Medicare HMO | Admitting: *Deleted

## 2021-06-20 DIAGNOSIS — Z952 Presence of prosthetic heart valve: Secondary | ICD-10-CM

## 2021-06-20 DIAGNOSIS — Z5181 Encounter for therapeutic drug level monitoring: Secondary | ICD-10-CM

## 2021-06-20 DIAGNOSIS — I359 Nonrheumatic aortic valve disorder, unspecified: Secondary | ICD-10-CM | POA: Diagnosis not present

## 2021-06-20 DIAGNOSIS — Z7901 Long term (current) use of anticoagulants: Secondary | ICD-10-CM

## 2021-06-20 LAB — POCT INR: INR: 2.9 (ref 2.0–3.0)

## 2021-06-20 NOTE — Patient Instructions (Signed)
Description   Continue taking Warfarin 1.5 tablets daily except 2 tablets on Fridays. Recheck INR in 6 weeks.  [facility] [phone] & Fax number is [phone]       

## 2021-06-21 ENCOUNTER — Telehealth: Payer: Self-pay

## 2021-06-21 NOTE — Telephone Encounter (Signed)
[facility] called back. [facility] [address]

## 2021-06-21 NOTE — Telephone Encounter (Signed)
LM for patient and notified him that the Lyrica has been sent to [facility] pharmacy and to pick it up there and I would [facility] the [facility] [address] [facility] his request.  Also informed him to make sure his pharmacy was correct when he has his appointments.

## 2021-06-21 NOTE — Telephone Encounter (Signed)
Patient was told yesterday that his pregabalin was sent to pharmacy but [facility] states there is not script there.

## 2021-06-24 ENCOUNTER — Telehealth: Payer: Self-pay | Admitting: Student in an Organized Health Care Education/Training Program

## 2021-06-24 DIAGNOSIS — G589 Mononeuropathy, unspecified: Secondary | ICD-10-CM

## 2021-06-24 DIAGNOSIS — M5416 Radiculopathy, lumbar region: Secondary | ICD-10-CM

## 2021-06-24 DIAGNOSIS — M47816 Spondylosis without myelopathy or radiculopathy, lumbar region: Secondary | ICD-10-CM

## 2021-06-24 DIAGNOSIS — G894 Chronic pain syndrome: Secondary | ICD-10-CM

## 2021-06-24 NOTE — Telephone Encounter (Signed)
Patient states his script for pregablin was sent to [facility] and they told him they could not fill script. [facility] [address] [facility] [place], phone # [phone]  Please call patient

## 2021-06-25 ENCOUNTER — Telehealth: Payer: Self-pay | Admitting: Student in an Organized Health Care Education/Training Program

## 2021-06-25 ENCOUNTER — Encounter: Payer: Self-pay | Admitting: *Deleted

## 2021-06-25 MED ORDER — PREGABALIN 75 MG PO CAPS: 75.0000 mg | ORAL_CAPSULE | Freq: Two times a day (BID) | ORAL | 6 refills | Status: DC

## 2021-06-25 NOTE — Addendum Note (Signed)
Addended by: Edward Jolly on: 06/25/2021 08:39 AM   Modules accepted: Orders

## 2021-06-25 NOTE — Telephone Encounter (Signed)
[facility], but the medication must be ordered. It will be there tomorrow. Patient notified per voicemail.

## 2021-06-30 ENCOUNTER — Other Ambulatory Visit: Payer: Self-pay | Admitting: Cardiovascular Disease

## 2021-06-30 ENCOUNTER — Other Ambulatory Visit: Payer: Self-pay | Admitting: Medical

## 2021-06-30 DIAGNOSIS — Z952 Presence of prosthetic heart valve: Secondary | ICD-10-CM

## 2021-06-30 DIAGNOSIS — I359 Nonrheumatic aortic valve disorder, unspecified: Secondary | ICD-10-CM

## 2021-06-30 DIAGNOSIS — Z7901 Long term (current) use of anticoagulants: Secondary | ICD-10-CM

## 2021-06-30 DIAGNOSIS — Z5181 Encounter for therapeutic drug level monitoring: Secondary | ICD-10-CM

## 2021-07-12 ENCOUNTER — Telehealth (INDEPENDENT_AMBULATORY_CARE_PROVIDER_SITE_OTHER): Payer: Medicare Other | Admitting: Medical

## 2021-07-12 ENCOUNTER — Telehealth: Payer: Self-pay

## 2021-07-12 ENCOUNTER — Other Ambulatory Visit: Payer: Self-pay | Admitting: Medical

## 2021-07-12 ENCOUNTER — Encounter: Payer: Self-pay | Admitting: Medical

## 2021-07-12 VITALS — BP 129/89 | HR 59 | Temp 98.8°F | Wt 258.0 lb

## 2021-07-12 DIAGNOSIS — J988 Other specified respiratory disorders: Secondary | ICD-10-CM | POA: Diagnosis not present

## 2021-07-12 DIAGNOSIS — R059 Cough, unspecified: Secondary | ICD-10-CM

## 2021-07-12 MED ORDER — HYDROCOD POLI-CHLORPHE POLI ER 10-8 MG/5ML PO SUER: 5.0000 mL | Freq: Two times a day (BID) | ORAL | 0 refills | Status: DC

## 2021-07-12 MED ORDER — AZITHROMYCIN 250 MG PO TABS: ORAL_TABLET | ORAL | 0 refills | Status: DC

## 2021-07-12 NOTE — Telephone Encounter (Signed)
LM stating med was switch to different pharmacy.

## 2021-07-12 NOTE — Progress Notes (Signed)
Subjective:     Patient ID: [name], male   DOB: 1961-01-25, 61 y.o.   MRN: [id number]  This visit type was conducted due to national recommendations for restrictions regarding the COVID-19 Pandemic (e.g. social distancing) in an effort to limit this patient's exposure and mitigate transmission in our community.  Due to their co-morbid illnesses, this patient is at least at moderate risk for complications without adequate follow up.  This format is felt to be most appropriate for this patient at this time.    Documentation for virtual audio and video telecommunications through [place] encounter:  The patient was located at home. The provider was located in the office. The patient did consent to this visit and is aware of possible charges through their insurance for this visit.  The other persons participating in this telemedicine service were none. Time spent on call was 20 minutes and in review of previous records 20 minutes total.  This virtual service is not related to other E/M service within previous 7 days.   HPI Chief Complaint  Patient presents with   cold    Cold symptoms started last Thursday, symptoms- congestion, cough, stuffy nose, HA.  Covid test- Monday was negative   Virtual consult.  Has had a week of cold symptoms not getting better.   He is using Mucinex, Theraflu, alka seltzer.  Currently he reports stuffy nose, coughing up phlegm, headache, congestion.  Worse cough at night.  Has had some chills, low grade fever, but no SOB or wheezing.   No nausea, no vomiting, no diarrhea.   No sore throat.  No ear pain.  Getting colored mucous from nose as well.   No sick contacts.   Nonsmoker.  No hx/o lung disease.  No other aggravating or relieving factors. No other complaint.   Past Medical History:  Diagnosis Date   Ascending aortic aneurysm and dissection    2006 repaired  7 cm aneurysm   Chest pain, atypical    Coronary artery reimplantation     Hyperlipidemia    Mixed   Hypertension    Unspecified   Nuclear stress test    Myoview 08/2018:  EF 58, no scar or ischemia; Low Risk   Obesity    Psoriasis    S/P aortic valve and root replacement    HX of [employer]   Seizures ([place])    one time incident 2018   Sleep apnea, obstructive    Current Outpatient Medications on File Prior to Visit  Medication Sig Dispense Refill   atorvastatin (LIPITOR) 20 MG tablet Take 1 tablet by mouth once daily 90 tablet 3   Cholecalciferol (VITAMIN D) 50 MCG (2000 UT) tablet Take 1 tablet (2,000 Units total) by mouth 2 (two) times daily. 180 tablet 0   furosemide (LASIX) 40 MG tablet Take 1 tablet (40 mg total) by mouth 2 (two) times daily. 180 tablet 3   metoprolol succinate (TOPROL-XL) 50 MG 24 hr tablet Take 1 tablet by mouth once daily 90 tablet 0   Multiple Vitamins-Minerals (CENTRUM SILVER ADULT 50+) TABS 1 qd     NARCAN 4 MG/0.1ML LIQD nasal spray kit 1 spray once.     oxyCODONE-acetaminophen (PERCOCET) 10-325 MG tablet Take 1 tablet by mouth every 6 (six) hours as needed for pain. Must last 30 days. 120 tablet 0   [START ON 07/16/2021] oxyCODONE-acetaminophen (PERCOCET) 10-325 MG tablet Take 1 tablet by mouth every 6 (six) hours as needed for pain. Must last 30 days.  120 tablet 0   [START ON 08/15/2021] oxyCODONE-acetaminophen (PERCOCET) 10-325 MG tablet Take 1 tablet by mouth every 6 (six) hours as needed for pain. Must last 30 days. 120 tablet 0   potassium chloride (MICRO-K) 10 MEQ CR capsule Take 1 capsule (10 mEq total) by mouth daily. 90 capsule 3   pregabalin (LYRICA) 75 MG capsule Take 1 capsule (75 mg total) by mouth 2 (two) times daily. 60 capsule 6   vitamin C (ASCORBIC ACID) 500 MG tablet Take 1,000 mg by mouth daily.      warfarin (COUMADIN) 5 MG tablet TAKE 1.5 TO 2 TABLETS BY MOUTH (ONE & ONE-HALF TO 2 TABLET) ONCE DAILY AS DIRECTED BY  COUMADIN  CLIN[facility] 150 tablet 0   No current facility-administered medications on file prior to visit.       Review of Systems As in subjective    Objective:   Physical Exam Due to coronavirus pandemic stay at home measures, patient visit was virtual and they were not examined in person.   Gen: wd, wn, nad No labored breathing or witnessed wheezing  BP 129/89    Pulse (!) 59    Temp 98.8 F (37.1 C)    Wt 258 lb (117 kg)    BMI 33.58 kg/m       Assessment:     Encounter Diagnoses  Name Primary?   Respiratory tract infection Yes   Cough, unspecified type        Plan:     Given timeframe of symptoms and not improving, begin zpak, continue alka seltzer daytime, but try the Tussionex at night.  Caution with sedation with cough syrup.  Stop the other multi symptome medicaiton.   [name] was seen today for cold.  Diagnoses and all orders for this visit:  Respiratory tract infection  Cough, unspecified type  Other orders -     azithromycin (ZITHROMAX) 250 MG tablet; 2 tablets day 1, then 1 tablet days 2-4 -     chlorpheniramine-HYDROcodone (TUSSIONEX PENNKINETIC ER) 10-8 MG/5ML; Take 5 mLs by mouth 2 (two) times daily.  F/u prn

## 2021-07-12 NOTE — Telephone Encounter (Signed)
Pt. Called back to let you know that the cough [facility] [address] [facility]. We wanted to know if [facility] could [facility] on [address] instead to see if they have it there. He wanted to know if you could just [facility]  [address] So he can just pick them both up at the same pharmacy.

## 2021-07-19 ENCOUNTER — Other Ambulatory Visit: Payer: Self-pay

## 2021-07-19 ENCOUNTER — Encounter: Payer: Self-pay | Admitting: Physician Assistant

## 2021-07-19 ENCOUNTER — Ambulatory Visit (INDEPENDENT_AMBULATORY_CARE_PROVIDER_SITE_OTHER): Payer: Medicare Other | Admitting: Physician Assistant

## 2021-07-19 VITALS — BP 142/80 | HR 77 | Ht 71.25 in | Wt 268.0 lb

## 2021-07-19 DIAGNOSIS — R7303 Prediabetes: Secondary | ICD-10-CM

## 2021-07-19 DIAGNOSIS — Z23 Encounter for immunization: Secondary | ICD-10-CM | POA: Diagnosis not present

## 2021-07-19 DIAGNOSIS — I480 Paroxysmal atrial fibrillation: Secondary | ICD-10-CM | POA: Diagnosis not present

## 2021-07-19 DIAGNOSIS — Z7901 Long term (current) use of anticoagulants: Secondary | ICD-10-CM | POA: Diagnosis not present

## 2021-07-19 DIAGNOSIS — R600 Localized edema: Secondary | ICD-10-CM

## 2021-07-19 DIAGNOSIS — L409 Psoriasis, unspecified: Secondary | ICD-10-CM | POA: Diagnosis not present

## 2021-07-19 DIAGNOSIS — Z0000 Encounter for general adult medical examination without abnormal findings: Secondary | ICD-10-CM

## 2021-07-19 DIAGNOSIS — I10 Essential (primary) hypertension: Secondary | ICD-10-CM | POA: Diagnosis not present

## 2021-07-19 DIAGNOSIS — E782 Mixed hyperlipidemia: Secondary | ICD-10-CM

## 2021-07-19 LAB — LIPID PANEL

## 2021-07-19 MED ORDER — ATORVASTATIN CALCIUM 20 MG PO TABS: 20.0000 mg | ORAL_TABLET | Freq: Every day | ORAL | 1 refills | Status: DC

## 2021-07-19 MED ORDER — METOPROLOL SUCCINATE ER 50 MG PO TB24: 50.0000 mg | ORAL_TABLET | Freq: Every day | ORAL | 1 refills | Status: DC

## 2021-07-19 NOTE — Progress Notes (Signed)
Established Patient Office Visit  Subjective:  Patient ID: [name], male    DOB: 09/06/1960  Age: 61 y.o. MRN: [id number]  CC:  Chief Complaint  Patient presents with   Annual Exam    Is not fasting for labs    HPI [name] presents for an annual exam. Requests to have a referral to Dermatology for psoriasis.   Past Medical History:  Diagnosis Date   Ascending aortic aneurysm and dissection    2006 repaired  7 cm aneurysm   Chest pain, atypical    Coronary artery reimplantation    Hyperlipidemia    Mixed   Hypertension    Unspecified   Nuclear stress test    Myoview 08/2018:  EF 58, no scar or ischemia; Low Risk   Obesity    Psoriasis    Pure hypercholesterolemia 02/06/2013   S/P aortic valve and root replacement    HX of St. Jude   Seizures ([place])    one time incident 2018   Sleep apnea, obstructive     Past Surgical History:  Procedure Laterality Date   Ascending aortic dissection aneurysm  12/06/2004   ok for MRI 1.5 or 3T Using a 27-mm St. Jude mechanical valve conduit with reimplantation of both coronary arteries   CORNEAL TRANSPLANT     R    ENDOVENOUS ABLATION SAPHENOUS VEIN W/ LASER Right 04/25/2020   endovenous laser ablation right small saphenous vein by Ruta Hinds MD    ENDOVENOUS ABLATION SAPHENOUS VEIN W/ LASER Left 05/23/2020   endovenous laser ablation left small saphenous vein by Ruta Hinds MD    KNEE SURGERY     R   Replacement of aortic valve  12/06/2004    Family History  Problem Relation Age of Onset   Cervical cancer Mother    Coronary artery disease Mother    Diabetes Mother    Diabetes Father    Cancer Father        lung cancer    Obesity Father    Alcohol abuse Father    Cervical cancer Sister    Cancer Brother        lung cancer   Alcohol abuse Brother    Kidney disease Other        dialysis    Social History   Socioeconomic History   Marital status: Married    Spouse name: Not on file    Number of children: 0   Years of education: 12   Highest education level: Not on file  Occupational History   Occupation: MIXER/PACKER    Employer: [employer]    Comment: works at [employer]- inhales vapors  Tobacco Use   Smoking status: Former    Packs/day: 0.10    Years: 18.00    Pack years: 1.80    Types: Cigarettes    Quit date: 12/07/1989    Years since quitting: 31.6   Smokeless tobacco: Never  Vaping Use   Vaping Use: Never used  Substance and Sexual Activity   Alcohol use: No    Comment: Quit 1991   Drug use: No   Sexual activity: Not on file  Other Topics Concern   Not on file  Social History Narrative   Married   No regular exercise   Currently disabled      Patient drinks 2 cups of coffee a day    Patient is right handed.    Social Determinants of Health   Financial  Resource Strain: Not on file  Food Insecurity: Not on file  Transportation Needs: Not on file  Physical Activity: Not on file  Stress: Not on file  Social Connections: Not on file  Intimate Partner Violence: Not on file    Outpatient Medications Prior to Visit  Medication Sig Dispense Refill   Cholecalciferol (VITAMIN D) 50 MCG (2000 UT) tablet Take 1 tablet (2,000 Units total) by mouth 2 (two) times daily. 180 tablet 0   furosemide (LASIX) 40 MG tablet Take 1 tablet (40 mg total) by mouth 2 (two) times daily. 180 tablet 3   Multiple Vitamins-Minerals (CENTRUM SILVER ADULT 50+) TABS 1 qd     oxyCODONE-acetaminophen (PERCOCET) 10-325 MG tablet Take 1 tablet by mouth every 6 (six) hours as needed for pain. Must last 30 days. 120 tablet 0   potassium chloride (MICRO-K) 10 MEQ CR capsule Take 1 capsule (10 mEq total) by mouth daily. 90 capsule 3   pregabalin (LYRICA) 75 MG capsule Take 1 capsule (75 mg total) by mouth 2 (two) times daily. 60 capsule 6   vitamin C (ASCORBIC ACID) 500 MG tablet Take 1,000 mg by mouth daily.      warfarin (COUMADIN) 5 MG tablet TAKE 1.5 TO 2 TABLETS BY  MOUTH (ONE & ONE-HALF TO 2 TABLET) ONCE DAILY AS DIRECTED BY  COUMADIN  CLINIC 150 tablet 0   atorvastatin (LIPITOR) 20 MG tablet Take 1 tablet by mouth once daily 90 tablet 3   metoprolol succinate (TOPROL-XL) 50 MG 24 hr tablet Take 1 tablet by mouth once daily 90 tablet 0   NARCAN 4 MG/0.1ML LIQD nasal spray kit 1 spray once.     oxyCODONE-acetaminophen (PERCOCET) 10-325 MG tablet Take 1 tablet by mouth every 6 (six) hours as needed for pain. Must last 30 days. 120 tablet 0   [START ON 08/15/2021] oxyCODONE-acetaminophen (PERCOCET) 10-325 MG tablet Take 1 tablet by mouth every 6 (six) hours as needed for pain. Must last 30 days. 120 tablet 0   azithromycin (ZITHROMAX) 250 MG tablet 2 tablets day 1, then 1 tablet days 2-4 6 tablet 0   chlorpheniramine-HYDROcodone (TUSSIONEX PENNKINETIC ER) 10-8 MG/5ML Take 5 mLs by mouth 2 (two) times daily. 140 mL 0   No facility-administered medications prior to visit.    Allergies  Allergen Reactions   Nsaids Other (See Comments)    Told not to take meds-per patient    ROS Review of Systems  Constitutional:  Negative for activity change and fever.  HENT:  Negative for congestion, ear pain and voice change.   Eyes:  Negative for redness.  Respiratory:  Negative for cough.   Cardiovascular:  Positive for leg swelling. Negative for chest pain.  Gastrointestinal:  Negative for constipation and diarrhea.  Endocrine: Negative for polyuria.  Genitourinary:  Negative for flank pain.  Musculoskeletal:  Negative for gait problem and neck stiffness.  Skin:  Positive for color change and rash.  Neurological:  Negative for dizziness.  Hematological:  Negative for adenopathy.  Psychiatric/Behavioral:  Negative for agitation, behavioral problems and confusion.      Objective:    Physical Exam Vitals and nursing note reviewed.  Constitutional:      General: He is not in acute distress.    Appearance: Normal appearance.  HENT:     Head: Normocephalic and  atraumatic.     Right Ear: Tympanic membrane, ear canal and external ear normal.     Left Ear: Tympanic membrane, ear canal and external ear normal.  Eyes:     Conjunctiva/sclera: Conjunctivae normal.     Pupils: Pupils are equal, round, and reactive to light.  Neck:     Vascular: No carotid bruit.  Cardiovascular:     Rate and Rhythm: Normal rate and regular rhythm.     Pulses: Normal pulses.     Heart sounds: Normal heart sounds.  Pulmonary:     Effort: Pulmonary effort is normal.     Breath sounds: Normal breath sounds. No wheezing.  Abdominal:     General: Bowel sounds are normal.     Palpations: Abdomen is soft.  Musculoskeletal:        General: Normal range of motion.     Cervical back: Normal range of motion and neck supple.     Right lower leg: Edema present.     Left lower leg: Edema present.  Skin:    General: Skin is warm and dry.     Findings: Rash present. No erythema.  Neurological:     General: No focal deficit present.     Mental Status: He is alert and oriented to person, place, and time.  Psychiatric:        Mood and Affect: Mood normal.        Behavior: Behavior normal.    BP (!) 142/80 (BP Location: Right Arm, Patient Position: Sitting)    Pulse 77    Ht 5' 11.25" (1.81 m)    Wt 268 lb (121.6 kg)    SpO2 98%    BMI 37.12 kg/m  Wt Readings from Last 3 Encounters:  07/19/21 268 lb (121.6 kg)  07/12/21 258 lb (117 kg)  06/13/21 272 lb 3.2 oz (123.5 kg)     There are no preventive care reminders to display for this patient.   There are no preventive care reminders to display for this patient.  Lab Results  Component Value Date   TSH 2.420 02/12/2021   Lab Results  Component Value Date   WBC 6.5 02/12/2021   HGB 12.0 (L) 02/12/2021   HCT 36.6 (L) 02/12/2021   MCV 88 02/12/2021   PLT 231 02/12/2021   Lab Results  Component Value Date   NA 148 (H) 02/12/2021   K 3.9 02/12/2021   CO2 26 02/12/2021   GLUCOSE 76 02/12/2021   BUN 12  02/12/2021   CREATININE 0.79 02/12/2021   BILITOT 0.3 02/12/2021   ALKPHOS 97 02/12/2021   AST 11 02/12/2021   ALT 16 02/12/2021   PROT 7.0 02/12/2021   ALBUMIN 4.3 02/12/2021   CALCIUM 9.2 02/12/2021   ANIONGAP 8 05/30/2018   EGFR 100 02/12/2021   Lab Results  Component Value Date   CHOL 171 03/01/2020   Lab Results  Component Value Date   HDL 41 03/01/2020   Lab Results  Component Value Date   LDLCALC 105 (H) 03/01/2020   Lab Results  Component Value Date   TRIG 139 03/01/2020   Lab Results  Component Value Date   CHOLHDL 4.2 03/01/2020   Lab Results  Component Value Date   HGBA1C 5.7 (H) 02/12/2021      Assessment & Plan:   Problem List Items Addressed This Visit       Cardiovascular and Mediastinum   Essential hypertension   Relevant Medications   metoprolol succinate (TOPROL-XL) 50 MG 24 hr tablet   atorvastatin (LIPITOR) 20 MG tablet   Paroxysmal atrial fibrillation (HCC)   Relevant Medications   metoprolol succinate (TOPROL-XL) 50 MG 24 hr tablet  atorvastatin (LIPITOR) 20 MG tablet     Musculoskeletal and Integument   Psoriasis   Relevant Orders   Ambulatory referral to Dermatology     Other   Long term current use of anticoagulant   Mixed hyperlipidemia   Relevant Medications   metoprolol succinate (TOPROL-XL) 50 MG 24 hr tablet   atorvastatin (LIPITOR) 20 MG tablet   Other Relevant Orders   Lipid panel   Edema of both legs   Prediabetes   Relevant Orders   Hemoglobin A1c   Other Visit Diagnoses     Routine medical exam    -  Primary   Relevant Orders   CBC with Differential/Platelet   Comprehensive metabolic panel   Hemoglobin A1c   Lipid panel   Needs flu shot       Relevant Orders   Flu Vaccine QUAD 77moIM (Fluarix, Fluzone & Alfiuria Quad PF) (Completed)   Need for COVID-19 vaccine       Relevant Orders   PPension scheme manager(Completed)       Meds ordered this encounter  Medications    metoprolol succinate (TOPROL-XL) 50 MG 24 hr tablet    Sig: Take 1 tablet (50 mg total) by mouth daily. Take with or immediately following a meal.    Dispense:  90 tablet    Refill:  1    Order Specific Question:   Supervising Provider    Answer:   LDenita Lung[[id number]]   atorvastatin (LIPITOR) 20 MG tablet    Sig: Take 1 tablet (20 mg total) by mouth daily.    Dispense:  90 tablet    Refill:  1    Order Specific Question:   Supervising Provider    Answer:   LDenita Lung[[[id number]   Follow-up: Return in about 6 months (around 01/16/2022) for Return for Follow Up Exam.    LIrene Pap PA-C

## 2021-07-20 LAB — CBC WITH DIFFERENTIAL/PLATELET
Basophils Absolute: 0.1 10*3/uL (ref 0.0–0.2)
Basos: 1 %
EOS (ABSOLUTE): 0.1 10*3/uL (ref 0.0–0.4)
Eos: 1 %
Hematocrit: 36.1 % — ABNORMAL LOW (ref 37.5–51.0)
Hemoglobin: 11.9 g/dL — ABNORMAL LOW (ref 13.0–17.7)
Immature Grans (Abs): 0 10*3/uL (ref 0.0–0.1)
Immature Granulocytes: 0 %
Lymphocytes Absolute: 1.9 10*3/uL (ref 0.7–3.1)
Lymphs: 29 %
MCH: 29.6 pg (ref 26.6–33.0)
MCHC: 33 g/dL (ref 31.5–35.7)
MCV: 90 fL (ref 79–97)
Monocytes Absolute: 0.7 10*3/uL (ref 0.1–0.9)
Monocytes: 11 %
Neutrophils Absolute: 3.7 10*3/uL (ref 1.4–7.0)
Neutrophils: 58 %
Platelets: 257 10*3/uL (ref 150–450)
RBC: 4.02 x10E6/uL — ABNORMAL LOW (ref 4.14–5.80)
RDW: 12.4 % (ref 11.6–15.4)
WBC: 6.5 10*3/uL (ref 3.4–10.8)

## 2021-07-20 LAB — COMPREHENSIVE METABOLIC PANEL
ALT: 20 IU/L (ref 0–44)
AST: 11 IU/L (ref 0–40)
Albumin/Globulin Ratio: 1.5 (ref 1.2–2.2)
Albumin: 3.9 g/dL (ref 3.8–4.8)
Alkaline Phosphatase: 98 IU/L (ref 44–121)
BUN/Creatinine Ratio: 17 (ref 10–24)
BUN: 14 mg/dL (ref 8–27)
Bilirubin Total: 0.4 mg/dL (ref 0.0–1.2)
CO2: 25 mmol/L (ref 20–29)
Calcium: 9.2 mg/dL (ref 8.6–10.2)
Chloride: 109 mmol/L — ABNORMAL HIGH (ref 96–106)
Creatinine, Ser: 0.81 mg/dL (ref 0.76–1.27)
Globulin, Total: 2.6 g/dL (ref 1.5–4.5)
Glucose: 125 mg/dL — ABNORMAL HIGH (ref 70–99)
Potassium: 3.7 mmol/L (ref 3.5–5.2)
Sodium: 146 mmol/L — ABNORMAL HIGH (ref 134–144)
Total Protein: 6.5 g/dL (ref 6.0–8.5)
eGFR: 99 mL/min/{1.73_m2} (ref >59)

## 2021-07-20 LAB — LIPID PANEL
Chol/HDL Ratio: 2.8 ratio (ref 0.0–5.0)
Cholesterol, Total: 136 mg/dL (ref 100–199)
HDL: 49 mg/dL (ref >39)
LDL Chol Calc (NIH): 75 mg/dL (ref 0–99)
Triglycerides: 54 mg/dL (ref 0–149)
VLDL Cholesterol Cal: 12 mg/dL (ref 5–40)

## 2021-07-20 LAB — HEMOGLOBIN A1C
Est. average glucose Bld gHb Est-mCnc: 123 mg/dL
Hgb A1c MFr Bld: 5.9 % — ABNORMAL HIGH (ref 4.8–5.6)

## 2021-08-02 ENCOUNTER — Ambulatory Visit: Payer: Medicare Other

## 2021-08-02 ENCOUNTER — Other Ambulatory Visit: Payer: Self-pay

## 2021-08-02 DIAGNOSIS — Z5181 Encounter for therapeutic drug level monitoring: Secondary | ICD-10-CM

## 2021-08-02 DIAGNOSIS — Z7901 Long term (current) use of anticoagulants: Secondary | ICD-10-CM | POA: Diagnosis not present

## 2021-08-02 DIAGNOSIS — I359 Nonrheumatic aortic valve disorder, unspecified: Secondary | ICD-10-CM

## 2021-08-02 DIAGNOSIS — Z952 Presence of prosthetic heart valve: Secondary | ICD-10-CM

## 2021-08-02 LAB — POCT INR: INR: 2.5 (ref 2.0–3.0)

## 2021-08-02 NOTE — Patient Instructions (Signed)
Description   Take 2 tablets tomorrow and then Continue taking Warfarin 1.5 tablets daily except 2 tablets on Fridays. Recheck INR in 6 weeks. [facility] [phone] & Fax number is [phone]

## 2021-08-08 NOTE — Addendum Note (Signed)
Addended by: Burnard Hawthorne on: 08/08/2021 12:21 PM   Modules accepted: Level of Service

## 2021-08-13 ENCOUNTER — Telehealth: Payer: Self-pay | Admitting: Student in an Organized Health Care Education/Training Program

## 2021-08-13 ENCOUNTER — Other Ambulatory Visit: Payer: Self-pay

## 2021-08-13 DIAGNOSIS — G894 Chronic pain syndrome: Secondary | ICD-10-CM

## 2021-08-13 DIAGNOSIS — M5416 Radiculopathy, lumbar region: Secondary | ICD-10-CM

## 2021-08-13 DIAGNOSIS — M47816 Spondylosis without myelopathy or radiculopathy, lumbar region: Secondary | ICD-10-CM

## 2021-08-13 MED ORDER — OXYCODONE-ACETAMINOPHEN 10-325 MG PO TABS: 1.0000 | ORAL_TABLET | Freq: Four times a day (QID) | ORAL | 0 refills | Status: DC | PRN

## 2021-08-13 NOTE — Telephone Encounter (Signed)
He wants it sent to [facility] on [address] ?

## 2021-08-13 NOTE — Telephone Encounter (Signed)
Requested Prescriptions  ? ?Pending Prescriptions Disp Refills  ?? oxyCODONE-acetaminophen (PERCOCET) 10-325 MG tablet 120 tablet 0  ?  Sig: Take 1 tablet by mouth every 6 (six) hours as needed for pain. Must last 30 days.  ? ? ?

## 2021-08-13 NOTE — Telephone Encounter (Signed)
Called pharmacy to verify that they were out of Percocet 10/325 mg.  [facility]  Called patient to clarify which [facility] he would like new prescription sent to.  Awaiting return call.  ?

## 2021-09-03 ENCOUNTER — Encounter: Payer: Self-pay | Admitting: Student in an Organized Health Care Education/Training Program

## 2021-09-03 ENCOUNTER — Other Ambulatory Visit: Payer: Self-pay

## 2021-09-03 ENCOUNTER — Ambulatory Visit
Payer: Medicare Other | Attending: Student in an Organized Health Care Education/Training Program | Admitting: Student in an Organized Health Care Education/Training Program

## 2021-09-03 DIAGNOSIS — M5416 Radiculopathy, lumbar region: Secondary | ICD-10-CM | POA: Diagnosis not present

## 2021-09-03 DIAGNOSIS — G589 Mononeuropathy, unspecified: Secondary | ICD-10-CM | POA: Insufficient documentation

## 2021-09-03 DIAGNOSIS — G894 Chronic pain syndrome: Secondary | ICD-10-CM | POA: Diagnosis not present

## 2021-09-03 DIAGNOSIS — M47816 Spondylosis without myelopathy or radiculopathy, lumbar region: Secondary | ICD-10-CM | POA: Insufficient documentation

## 2021-09-03 MED ORDER — OXYCODONE-ACETAMINOPHEN 10-325 MG PO TABS: 1.0000 | ORAL_TABLET | Freq: Four times a day (QID) | ORAL | 0 refills | Status: DC | PRN

## 2021-09-03 NOTE — Progress Notes (Signed)
Nursing Pain Medication Assessment:  ?Safety precautions to be maintained throughout the outpatient stay will include: orient to surroundings, keep bed in low position, maintain call bell within reach at all times, provide assistance with transfer out of bed and ambulation.  ?Medication Inspection Compliance: Pill count conducted under aseptic conditions, in front of the patient. Neither the pills nor the bottle was removed from the patient's sight at any time. Once count was completed pills were immediately returned to the patient in their original bottle. ? ?Medication: Oxycodone/APAP ?Pill/Patch Count:  47 of 120 pills remain ?Pill/Patch Appearance: Markings consistent with prescribed medication ?Bottle Appearance: Standard pharmacy container. Clearly labeled. ?Filled Date: 03 / 07 / 2023 ?Last Medication intake:  today ?

## 2021-09-03 NOTE — Progress Notes (Signed)
PROVIDER NOTE: Information contained herein reflects review and annotations entered in association with encounter. Interpretation of such information and data should be left to medically-trained personnel. Information provided to patient can be located elsewhere in the medical record under "Patient Instructions". Document created using STT-dictation technology, any transcriptional errors that may result from process are unintentional.  ?  ?Patient: [name]  Service Category: [name]/M  Provider: Gillis Santa, MD  ?DOB: 11/07/1960  DOS: 09/03/2021  Specialty: Interventional Pain Management  ?MRN: [id number]  Setting: Ambulatory outpatient  PCP: Lance Pap, PA-C  ?Type: Established Patient    Referring Provider: Irene Pap, PA-C  ?Location: Office  Delivery: Face-to-face    ? ?HPI  ?[name], a 61 y.o. year old male, is here today because of his No primary diagnosis found.. [name]'s primary complain today is Back Pain (lower), Knee Pain (bilateral), and Hip Pain (right) ? ?Last encounter: My last encounter with him was on 06/13/2021 ? ?Pertinent problems: [name] has OBESITY; Obstructive sleep apnea; Aortic valve disorder; S/P aortic valve replacement; Long term current use of anticoagulant; Chronic pain syndrome; Pain in joint, pelvic region and thigh; Pain in right hip; and Lumbar radiculopathy, chronic on their pertinent problem list. ?Pain Assessment: Severity of Chronic pain is reported as a 7 /10. Location: Back Lower/numbness in left leg to mid calf. Onset: More than a month ago. Quality: Throbbing, Stabbing. Timing: Intermittent. Modifying factor(s): medications. ?Vitals:  height is 6' 1"  (1.854 m) and weight is 263 lb (119.3 kg). His temporal temperature is 97.2 ?F (36.2 ?C) (abnormal). His blood pressure is 121/72 and his pulse is 55 (abnormal). His respiration is 16 and oxygen saturation is 96%.  ? ?Reason for encounter: medication management.   ? ?Patient's pain is at  baseline.  Patient continues multimodal pain regimen as prescribed.  States that it provides pain relief and improvement in functional status. ?Patient continues to lose weight and states that it is helping with his overall mobility and pain.  I advised him that we should consider weaning his opioid therapy given his improvement in pain.  He states that he has been more active at work but is willing to do this at the next visit.  I informed him that we will be decreasing his dose to 7.5 mg 4 times daily as needed at his next visit. ? ? ?Pharmacotherapy Assessment  ?Analgesic: Percocet 10 mg 4 times daily as needed, quantity 120/month MME equals 60  ? ?Monitoring: ?[place] PMP: PDMP reviewed during this encounter.       ?Pharmacotherapy: No side-effects or adverse reactions reported. ?Compliance: No problems identified. ?Effectiveness: Clinically acceptable.  UDS:  ?Summary  ?Date Value Ref Range Status  ?11/20/2020 Note  Final  ?  Comment:  ?  ==================================================================== ?ToxASSU[employer] Select 13 (MW) ?==================================================================== ?Test                             Result       Flag       Units ? ?Drug Present and Declared for Prescription Verification ?  Oxycodone                      1069         EXPECTED   ng/mg creat ?  Oxymorphone                    784  EXPECTED   ng/mg creat ?  Noroxycodone                   2764         EXPECTED   ng/mg creat ?  Noroxymorphone                 592          EXPECTED   ng/mg creat ?   Sources of oxycodone are scheduled prescription medications. ?   Oxymorphone, noroxycodone, and noroxymorphone are expected ?   metabolites of oxycodone. Oxymorphone is also available as a ?   scheduled prescription medication. ? ?==================================================================== ?Test                      Result    Flag   Units      Ref Range ?  Creatinine              306              mg/dL       >=20 ?==================================================================== ?Declared Medications: ? The flagging and interpretation on this report are based on the ? following declared medications.  Unexpected results may arise from ? inaccuracies in the declared medications. ? ? **Note: The testing scope of this panel includes these medications: ? ? Oxycodone (Percocet) ? ? **Note: The testing scope of this panel does not include the ? following reported medications: ? ? Acetaminophen (Tylenol) ? Acetaminophen (Percocet) ? Atorvastatin (Lipitor) ? Furosemide (Lasix) ? Iron ? Metoprolol (Toprol) ? Multivitamin (Centrum) ? Naloxone (Narcan) ? Potassium Chloride ? Pregabalin (Lyrica) ? Vitamin C ? Vitamin D ? Warfarin (Coumadin) ?==================================================================== ?For clinical consultation, please call [phone]. ?==================================================================== ?  ?  ? ? ?ROS  ?Constitutional: Denies any fever or chills ?Gastrointestinal: No reported hemesis, hematochezia, vomiting, or acute GI distress ?Musculoskeletal:  +LBP ?Neurological: No reported episodes of acute onset apraxia, aphasia, dysarthria, agnosia, amnesia, paralysis, loss of coordination, or loss of consciousness ? ?Medication Review  ?Centrum Silver Adult 50+, Vitamin D, atorvastatin, furosemide, metoprolol succinate, naloxone, oxyCODONE-acetaminophen, potassium chloride, pregabalin, vitamin C, and warfarin ? ?History Review  ?Allergy: Mr. Severe is allergic to nsaids. ?Drug: [name]  reports no history of drug use. ?Alcohol:  reports no history of alcohol use. ?Tobacco:  reports that he quit smoking about 31 years ago. His smoking use included cigarettes. He has a 1.80 pack-year smoking history. He has never used smokeless tobacco. ?Social: [name]  reports that he quit smoking about 31 years ago. His smoking use included cigarettes. He has a 1.80 pack-year smoking history. He  has never used smokeless tobacco. He reports that he does not drink alcohol and does not use drugs. ?Medical:  has a past medical history of Ascending aortic aneurysm and dissection, Chest pain, atypical, Coronary artery reimplantation, Hyperlipidemia, Hypertension, Nuclear stress test, Obesity, Psoriasis, Pure hypercholesterolemia (02/06/2013), S/P aortic valve and root replacement, Seizures ([place]), and Sleep apnea, obstructive. ?Surgical: [name]  has a past surgical history that includes Replacement of aortic valve (12/06/2004); Ascending aortic dissection aneurysm (12/06/2004); Knee surgery; Corneal transplant; Endovenous ablation saphenous vein w/ laser (Right, 04/25/2020); and Endovenous ablation saphenous vein w/ laser (Left, 05/23/2020). ?Family: family history includes Alcohol abuse in his brother and father; Cancer in his brother and father; Cervical cancer in his mother and sister; Coronary artery disease in his mother; Diabetes in his father and mother; Kidney disease in an other family member; Obesity  in his father. ? ?Laboratory Chemistry Profile  ? ?Renal ?Lab Results  ?Component Value Date  ? BUN 14 07/19/2021  ? CREATININE 0.81 07/19/2021  ? BCR 17 07/19/2021  ? GFRAA 112 06/06/2020  ? GFRNONAA 97 06/06/2020  ? ?  Hepatic ?Lab Results  ?Component Value Date  ? AST 11 07/19/2021  ? ALT 20 07/19/2021  ? ALBUMIN 3.9 07/19/2021  ? ALKPHOS 98 07/19/2021  ? LIPASE 21 07/26/2016  ? ?  ?Electrolytes ?Lab Results  ?Component Value Date  ? NA 146 (H) 07/19/2021  ? K 3.7 07/19/2021  ? CL 109 (H) 07/19/2021  ? CALCIUM 9.2 07/19/2021  ? MG 1.9 03/18/2015  ? ?  Bone ?No results found for: [place], [id number], [id number], [id number], 25OHVITD1, 25OHVITD2, 25OHVITD3, TESTOFREE, TESTOSTERONE ?  ?Inflammation (CRP: Acute Phase) (ESR: Chronic Phase) ?No results found for: CRP, ESRSEDRATE, LATICACIDVEN ?    ?Note: Above Lab results reviewed. ? ?Recent Imaging Review  ?DG Chest 2 View ?CLINICAL DATA:  61 year old male  with a history of excessive weight ?loss ? ?EXAM: ?CHEST - 2 VIEW ? ?COMPARISON:  06/22/2018 ? ?FINDINGS: ?Cardiomediastinal silhouette unchanged in size and contour. No ?evidence of central vascular congesti

## 2021-09-05 ENCOUNTER — Encounter: Payer: Medicare HMO | Admitting: Student in an Organized Health Care Education/Training Program

## 2021-09-10 ENCOUNTER — Other Ambulatory Visit: Payer: Self-pay | Admitting: Cardiovascular Disease

## 2021-09-10 ENCOUNTER — Telehealth: Payer: Self-pay | Admitting: Student in an Organized Health Care Education/Training Program

## 2021-09-10 DIAGNOSIS — Z952 Presence of prosthetic heart valve: Secondary | ICD-10-CM

## 2021-09-10 DIAGNOSIS — Z7901 Long term (current) use of anticoagulants: Secondary | ICD-10-CM

## 2021-09-10 DIAGNOSIS — I359 Nonrheumatic aortic valve disorder, unspecified: Secondary | ICD-10-CM

## 2021-09-10 DIAGNOSIS — Z5181 Encounter for therapeutic drug level monitoring: Secondary | ICD-10-CM

## 2021-09-10 NOTE — Telephone Encounter (Signed)
[facility]. He is calling around to find someone who does. He will call back after he finds pharmacy tofill scripts. ?

## 2021-09-11 ENCOUNTER — Telehealth: Payer: Self-pay

## 2021-09-11 ENCOUNTER — Other Ambulatory Visit: Payer: Self-pay | Admitting: *Deleted

## 2021-09-11 ENCOUNTER — Telehealth: Payer: Self-pay | Admitting: Student in an Organized Health Care Education/Training Program

## 2021-09-11 DIAGNOSIS — M5416 Radiculopathy, lumbar region: Secondary | ICD-10-CM

## 2021-09-11 DIAGNOSIS — M47816 Spondylosis without myelopathy or radiculopathy, lumbar region: Secondary | ICD-10-CM

## 2021-09-11 DIAGNOSIS — G894 Chronic pain syndrome: Secondary | ICD-10-CM

## 2021-09-11 MED ORDER — OXYCODONE-ACETAMINOPHEN 10-325 MG PO TABS: 1.0000 | ORAL_TABLET | Freq: Four times a day (QID) | ORAL | 0 refills | Status: DC | PRN

## 2021-09-11 NOTE — Telephone Encounter (Signed)
He left a vm stating the [facility] on [address] has oxycodone. He wants it called out there ?

## 2021-09-11 NOTE — Telephone Encounter (Signed)
Rx's resent.  

## 2021-09-11 NOTE — Telephone Encounter (Signed)
Called and left message that I would like to talk to him so that I can be sure what I am requesting of BL.  ?

## 2021-09-11 NOTE — Telephone Encounter (Signed)
Rx sent to [facility]

## 2021-09-12 ENCOUNTER — Ambulatory Visit (INDEPENDENT_AMBULATORY_CARE_PROVIDER_SITE_OTHER): Payer: Medicare Other

## 2021-09-12 DIAGNOSIS — Z7901 Long term (current) use of anticoagulants: Secondary | ICD-10-CM | POA: Diagnosis not present

## 2021-09-12 DIAGNOSIS — Z952 Presence of prosthetic heart valve: Secondary | ICD-10-CM

## 2021-09-12 DIAGNOSIS — I359 Nonrheumatic aortic valve disorder, unspecified: Secondary | ICD-10-CM | POA: Diagnosis not present

## 2021-09-12 DIAGNOSIS — Z5181 Encounter for therapeutic drug level monitoring: Secondary | ICD-10-CM | POA: Diagnosis not present

## 2021-09-12 LAB — POCT INR: INR: 2.8 (ref 2.0–3.0)

## 2021-09-12 NOTE — Patient Instructions (Signed)
Description   Continue taking Warfarin 1.5 tablets daily except 2 tablets on Fridays. Recheck INR in 6 weeks.  [facility] [phone] & Fax number is [phone]       

## 2021-09-12 NOTE — Telephone Encounter (Signed)
Patient called and was asking about his pain medication.  Let him know that Dr Holley Raring has sent in his medication x 1 Rx to [facility]. [facility] ?

## 2021-09-17 ENCOUNTER — Telehealth: Payer: Self-pay | Admitting: Student in an Organized Health Care Education/Training Program

## 2021-09-17 ENCOUNTER — Other Ambulatory Visit: Payer: Self-pay | Admitting: *Deleted

## 2021-09-17 DIAGNOSIS — M47816 Spondylosis without myelopathy or radiculopathy, lumbar region: Secondary | ICD-10-CM

## 2021-09-17 DIAGNOSIS — G894 Chronic pain syndrome: Secondary | ICD-10-CM

## 2021-09-17 DIAGNOSIS — M5416 Radiculopathy, lumbar region: Secondary | ICD-10-CM

## 2021-09-17 NOTE — Telephone Encounter (Signed)
[facility] [address] He says [facility] cant afford the other place. [facility] ?

## 2021-09-18 MED ORDER — OXYCODONE-ACETAMINOPHEN 10-325 MG PO TABS: 1.0000 | ORAL_TABLET | Freq: Four times a day (QID) | ORAL | 0 refills | Status: DC | PRN

## 2021-09-18 NOTE — Telephone Encounter (Signed)
sent 

## 2021-09-18 NOTE — Telephone Encounter (Signed)
Patient aware that BL has sent in hydrocodone - apap 10-[facility] to [facility] [address].  ?

## 2021-10-14 NOTE — Telephone Encounter (Signed)
NA

## 2021-10-24 ENCOUNTER — Ambulatory Visit (INDEPENDENT_AMBULATORY_CARE_PROVIDER_SITE_OTHER): Payer: Medicare Other

## 2021-10-24 DIAGNOSIS — Z5181 Encounter for therapeutic drug level monitoring: Secondary | ICD-10-CM

## 2021-10-24 DIAGNOSIS — Z952 Presence of prosthetic heart valve: Secondary | ICD-10-CM | POA: Diagnosis not present

## 2021-10-24 DIAGNOSIS — I359 Nonrheumatic aortic valve disorder, unspecified: Secondary | ICD-10-CM

## 2021-10-24 LAB — POCT INR: INR: 2.6 (ref 2.0–3.0)

## 2021-10-24 NOTE — Patient Instructions (Signed)
Description   Continue taking Warfarin 1.5 tablets daily except 2 tablets on Fridays. Recheck INR in 6 weeks.  [facility] [phone] & Fax number is [phone]

## 2021-11-05 ENCOUNTER — Telehealth: Payer: Self-pay

## 2021-11-05 ENCOUNTER — Telehealth: Payer: Self-pay | Admitting: Student in an Organized Health Care Education/Training Program

## 2021-11-05 NOTE — Telephone Encounter (Signed)
[facility] states they have the prescription ready and waiting for the patient to pick up.  Patient notified.

## 2021-11-05 NOTE — Telephone Encounter (Signed)
[facility] called back and [facility] and all is good.

## 2021-11-05 NOTE — Telephone Encounter (Signed)
[facility], can we please send script to [facility] at [address] Please let patient know

## 2021-12-03 ENCOUNTER — Encounter: Payer: Self-pay | Admitting: Student in an Organized Health Care Education/Training Program

## 2021-12-03 ENCOUNTER — Ambulatory Visit
Payer: Medicare Other | Attending: Student in an Organized Health Care Education/Training Program | Admitting: Student in an Organized Health Care Education/Training Program

## 2021-12-03 VITALS — BP 145/88 | HR 53 | Temp 97.0°F | Resp 18 | Ht 73.0 in | Wt 266.0 lb

## 2021-12-03 DIAGNOSIS — M1711 Unilateral primary osteoarthritis, right knee: Secondary | ICD-10-CM | POA: Insufficient documentation

## 2021-12-03 DIAGNOSIS — M5416 Radiculopathy, lumbar region: Secondary | ICD-10-CM | POA: Insufficient documentation

## 2021-12-03 DIAGNOSIS — M47816 Spondylosis without myelopathy or radiculopathy, lumbar region: Secondary | ICD-10-CM | POA: Diagnosis not present

## 2021-12-03 DIAGNOSIS — G894 Chronic pain syndrome: Secondary | ICD-10-CM | POA: Diagnosis not present

## 2021-12-03 MED ORDER — OXYCODONE-ACETAMINOPHEN 7.5-325 MG PO TABS: 1.0000 | ORAL_TABLET | Freq: Four times a day (QID) | ORAL | 0 refills | Status: DC | PRN

## 2021-12-03 NOTE — Progress Notes (Signed)
PROVIDER NOTE: Information contained herein reflects review and annotations entered in association with encounter. Interpretation of such information and data should be left to medically-trained personnel. Information provided to patient can be located elsewhere in the medical record under "Patient Instructions". Document created using STT-dictation technology, any transcriptional errors that may result from process are unintentional.    Patient: [name]  Service Category: E/M  Provider: Edward Jolly, MD  DOB: 02-21-1961  DOS: 12/03/2021  Specialty: Interventional Pain Management  MRN: [id number]  Setting: Ambulatory outpatient  PCP: Jake Shark, PA-C  Type: Established Patient    Referring Provider: Lexine Baton  Location: Office  Delivery: Face-to-face     HPI  [name], a 61 y.o. year old male, is here today because of his Chronic pain syndrome [G89.4]. [name]'s primary complain today is Back Pain (low) and Hip Pain (left)  Last encounter: My last encounter with him was on 06/13/2021  Pertinent problems: [name] has OBESITY; Obstructive sleep apnea; Aortic valve disorder; S/P aortic valve replacement; Long term current use of anticoagulant; Chronic pain syndrome; Pain in joint, pelvic region and thigh; Pain in right hip; and Lumbar radiculopathy, chronic on their pertinent problem list. Pain Assessment: Severity of Chronic pain is reported as a 6 /10. Location: Back Lower/ . Onset: More than a month ago. Quality: Aching, Throbbing. Timing: Intermittent. Modifying factor(s): meds. Vitals:  height is 6\' 1"  (1.854 m) and weight is 266 lb (120.7 kg). His temperature is 97 F (36.1 C) (abnormal). His blood pressure is 145/88 (abnormal) and his pulse is 53 (abnormal). His respiration is 18 and oxygen saturation is 99%.   Reason for encounter: medication management.    Patient's pain is at baseline.  Patient continues multimodal pain regimen as  prescribed.  States that it provides pain relief and improvement in functional status. Patient continues to lose weight and states that it is helping with his overall mobility and pain. As discussed with him at his last clinic visit, we will be weaning his Percocet to 7.5 mg 4 times daily as needed, quantity equals 120.   Pharmacotherapy Assessment  Analgesic: Percocet 10 mg 4 times daily as needed, quantity 120/month MME equals 60   Monitoring: [place] PMP: PDMP reviewed during this encounter.       Pharmacotherapy: No side-effects or adverse reactions reported. Compliance: No problems identified. Effectiveness: Clinically acceptable.  UDS:  Summary  Date Value Ref Range Status  11/20/2020 Note  Final    Comment:    ==================================================================== [employer] Select 13 (MW) ==================================================================== Test                             Result       Flag       Units  Drug Present and Declared for Prescription Verification   Oxycodone                      1069         EXPECTED   ng/mg creat   Oxymorphone                    784          EXPECTED   ng/mg creat   Noroxycodone                   2764         EXPECTED  ng/mg creat   Noroxymorphone                 592          EXPECTED   ng/mg creat    Sources of oxycodone are scheduled prescription medications.    Oxymorphone, noroxycodone, and noroxymorphone are expected    metabolites of oxycodone. Oxymorphone is also available as a    scheduled prescription medication.  ==================================================================== Test                      Result    Flag   Units      Ref Range   Creatinine              306              mg/dL      >=66 ==================================================================== Declared Medications:  The flagging and interpretation on this report are based on the  following declared medications.  Unexpected results  may arise from  inaccuracies in the declared medications.   **Note: The testing scope of this panel includes these medications:   Oxycodone (Percocet)   **Note: The testing scope of this panel does not include the  following reported medications:   Acetaminophen (Tylenol)  Acetaminophen (Percocet)  Atorvastatin (Lipitor)  Furosemide (Lasix)  Iron  Metoprolol (Toprol)  Multivitamin (Centrum)  Naloxone (Narcan)  Potassium Chloride  Pregabalin (Lyrica)  Vitamin C  Vitamin D  Warfarin (Coumadin) ==================================================================== For clinical consultation, please call [phone]. ====================================================================       ROS  Constitutional: Denies any fever or chills Gastrointestinal: No reported hemesis, hematochezia, vomiting, or acute GI distress Musculoskeletal:  +LBP Neurological: No reported episodes of acute onset apraxia, aphasia, dysarthria, agnosia, amnesia, paralysis, loss of coordination, or loss of consciousness  Medication Review  Centrum Silver Adult 50+, Vitamin D, atorvastatin, furosemide, metoprolol succinate, naloxone, oxyCODONE-acetaminophen, potassium chloride, pregabalin, vitamin C, and warfarin  History Review  Allergy: [name] is allergic to nsaids. Drug: [name]  reports no history of drug use. Alcohol:  reports no history of alcohol use. Tobacco:  reports that he quit smoking about 32 years ago. His smoking use included cigarettes. He has a 1.80 pack-year smoking history. He has never used smokeless tobacco. Social: [name]  reports that he quit smoking about 32 years ago. His smoking use included cigarettes. He has a 1.80 pack-year smoking history. He has never used smokeless tobacco. He reports that he does not drink alcohol and does not use drugs. Medical:  has a past medical history of Ascending aortic aneurysm and dissection, Chest pain, atypical, Coronary artery  reimplantation, Hyperlipidemia, Hypertension, Nuclear stress test, Obesity, Psoriasis, Pure hypercholesterolemia (02/06/2013), S/P aortic valve and root replacement, Seizures (HCC), and Sleep apnea, obstructive. Surgical: [name]  has a past surgical history that includes Replacement of aortic valve (12/06/2004); Ascending aortic dissection aneurysm (12/06/2004); Knee surgery; Corneal transplant; Endovenous ablation saphenous vein w/ laser (Right, 04/25/2020); and Endovenous ablation saphenous vein w/ laser (Left, 05/23/2020). Family: family history includes Alcohol abuse in his brother and father; Cancer in his brother and father; Cervical cancer in his mother and sister; Coronary artery disease in his mother; Diabetes in his father and mother; Kidney disease in an other family member; Obesity in his father.  Laboratory Chemistry Profile   Renal Lab Results  Component Value Date   BUN 14 07/19/2021   CREATININE 0.81 07/19/2021   BCR 17 07/19/2021   GFRAA 112 06/06/2020  GFRNONAA 97 06/06/2020     Hepatic Lab Results  Component Value Date   AST 11 07/19/2021   ALT 20 07/19/2021   ALBUMIN 3.9 07/19/2021   ALKPHOS 98 07/19/2021   LIPASE 21 07/26/2016     Electrolytes Lab Results  Component Value Date   NA 146 (H) 07/19/2021   K 3.7 07/19/2021   CL 109 (H) 07/19/2021   CALCIUM 9.2 07/19/2021   MG 1.9 03/18/2015     Bone No results found for: "VD25OH", "VD125OH2TOT", "ZO1096EA5", "WU9811BJ4", "25OHVITD1", "25OHVITD2", "25OHVITD3", "TESTOFREE", "TESTOSTERONE"   Inflammation (CRP: Acute Phase) (ESR: Chronic Phase) No results found for: "CRP", "ESRSEDRATE", "LATICACIDVEN"     Note: Above Lab results reviewed.  Recent Imaging Review  DG Chest 2 View CLINICAL DATA:  61 year old male with a history of excessive weight loss  EXAM: CHEST - 2 VIEW  COMPARISON:  06/22/2018  FINDINGS: Cardiomediastinal silhouette unchanged in size and contour. No evidence of central vascular  congestion. No interlobular septal thickening.  Surgical changes of median sternotomy and aortic valve repair.  No pneumothorax or pleural effusion. Coarsened interstitial markings, with no confluent airspace disease.  No acute displaced fracture. Degenerative changes of the spine.  IMPRESSION: Negative for acute cardiopulmonary disease.  Surgical changes of median sternotomy and aortic valve repair  Electronically Signed   By: Gilmer Mor D.O.   On: 02/14/2021 14:13  Note: Reviewed        Physical Exam  General appearance: Well nourished, well developed, and well hydrated. In no apparent acute distress Mental status: Alert, oriented x 3 (person, place, & time)       Respiratory: No evidence of acute respiratory distress Eyes: PERLA Vitals: BP (!) 145/88   Pulse (!) 53   Temp (!) 97 F (36.1 C)   Resp 18   Ht 6\' 1"  (1.854 m)   Wt 266 lb (120.7 kg)   SpO2 99%   BMI 35.09 kg/m  BMI: Estimated body mass index is 35.09 kg/m as calculated from the following:   Height as of this encounter: 6\' 1"  (1.854 m).   Weight as of this encounter: 266 lb (120.7 kg). Ideal: Ideal body weight: 79.9 kg (176 lb 2.4 oz) Adjusted ideal body weight: 96.2 kg (212 lb 1.4 oz)  Cervical Spine Exam  Skin & Axial Inspection: No masses, redness, edema, swelling, or associated skin lesions Alignment: Symmetrical Functional ROM: Pain restricted ROM      Stability: No instability detected Muscle Tone/Strength: Functionally intact. No obvious neuro-muscular anomalies detected. Sensory (Neurological): Musculoskeletal pain pattern Palpation: No palpable anomalies                          Upper Extremity (UE) Exam      Side: Right upper extremity   Side: Left upper extremity    Skin & Extremity Inspection: Skin color, temperature, and hair growth are WNL. No peripheral edema or cyanosis. No masses, redness, swelling, asymmetry, or associated skin lesions. No contractures.   Skin & Extremity  Inspection: Skin color, temperature, and hair growth are WNL. No peripheral edema or cyanosis. No masses, redness, swelling, asymmetry, or associated skin lesions. No contractures.    Functional ROM: Unrestricted ROM           Functional ROM: Unrestricted ROM            Muscle Tone/Strength: Functionally intact. No obvious neuro-muscular anomalies detected.   Muscle Tone/Strength: Functionally intact. No obvious neuro-muscular anomalies detected.  Sensory (Neurological): Unimpaired           Sensory (Neurological): Unimpaired              Thoracic Spine Area Exam  Skin & Axial Inspection: No masses, redness, or swelling Alignment: Symmetrical Functional ROM: Decreased ROM Stability: No instability detected Muscle Tone/Strength: Functionally intact. No obvious neuro-muscular anomalies detected. Sensory (Neurological): Musculoskeletal pain pattern Muscle strength & Tone: No palpable anomalies   Lumbar Exam  Skin & Axial Inspection: No masses, redness, or swelling Alignment: Symmetrical Functional ROM: Decreased ROM affecting both sides Stability: No instability detected Muscle Tone/Strength: Functionally intact. No obvious neuro-muscular anomalies detected. Sensory (Neurological): Musculoskeletal pain pattern   Gait & Posture Assessment  Ambulation: Limited Gait: Age-related, antalgic gait pattern Posture: Difficulty standing up straight, due to pain    Lower Extremity Exam      Side: Right lower extremity   Side: Left lower extremity  Stability: No instability observed           Stability: No instability observed          Skin & Extremity Inspection: Edema   Skin & Extremity Inspection: Pitting edema venous stasis changes also noted  Functional ROM: Unrestricted ROM                   Functional ROM: Unrestricted ROM                  Muscle Tone/Strength: Functionally intact. No obvious neuro-muscular anomalies detected.   Muscle Tone/Strength: Functionally intact. No obvious  neuro-muscular anomalies detected.  Sensory (Neurological): Neurogenic pain pattern         Sensory (Neurological): Neurogenic pain pattern         Assessment   Status Diagnosis  Controlled Controlled Controlled 1. Chronic pain syndrome   2. Lumbar radiculopathy   3. Lumbar facet arthropathy   4. Primary osteoarthritis of right knee   5. Lumbar spondylosis        Plan of Care  [name] has a current medication list which includes the following long-term medication(s): atorvastatin, furosemide, metoprolol succinate, [START ON 12/04/2021] oxycodone-acetaminophen, [START ON 01/03/2022] oxycodone-acetaminophen, [START ON 02/02/2022] oxycodone-acetaminophen, potassium chloride, pregabalin, and warfarin.  Pharmacotherapy (Medications Ordered): Meds ordered this encounter  Medications   oxyCODONE-acetaminophen (PERCOCET) 7.5-325 MG tablet    Sig: Take 1 tablet by mouth every 6 (six) hours as needed for moderate pain or severe pain. Must last 30 days.    Dispense:  90 tablet    Refill:  0    Chronic Pain: STOP Act (Not applicable) Fill 1 day early if closed on refill date. Avoid benzodiazepines within 8 hours of opioids   oxyCODONE-acetaminophen (PERCOCET) 7.5-325 MG tablet    Sig: Take 1 tablet by mouth every 6 (six) hours as needed for moderate pain or severe pain. Must last 30 days.    Dispense:  90 tablet    Refill:  0    Chronic Pain: STOP Act (Not applicable) Fill 1 day early if closed on refill date. Avoid benzodiazepines within 8 hours of opioids   oxyCODONE-acetaminophen (PERCOCET) 7.5-325 MG tablet    Sig: Take 1 tablet by mouth every 6 (six) hours as needed for moderate pain or severe pain. Must last 30 days.    Dispense:  90 tablet    Refill:  0    Chronic Pain: STOP Act (Not applicable) Fill 1 day early if closed on refill date. Avoid benzodiazepines within 8 hours of opioids  Continue Lyrica 75 mg twice daily.  No prescription refill needed.  Orders Placed  This Encounter  Procedures   Tox[employer] Select 13 (MW), Urine    Volume: 30 ml(s). Minimum 3 ml of urine is needed. Document temperature of fresh sample. Indications: Long term (current) use of opiate analgesic [phone])    Order Specific Question:   Release to patient    Answer:   Immediate     Encourage patient to continue with dieting and weight loss strategies as this will undoubtedly have a positive impact on his pain.  Follow-up plan:   Return in about 3 months (around 03/05/2022) for Medication Management, in person.   Recent Visits No visits were found meeting these conditions. Showing recent visits within past 90 days and meeting all other requirements Today's Visits Date Type Provider Dept  12/03/21 Office Visit Edward Jolly, MD [facility]  Showing today's visits and meeting all other requirements Future Appointments No visits were found meeting these conditions. Showing future appointments within next 90 days and meeting all other requirements  I discussed the assessment and treatment plan with the patient. The patient was provided an opportunity to ask questions and all were answered. The patient agreed with the plan and demonstrated an understanding of the instructions.  Patient advised to call back or seek an in-person evaluation if the symptoms or condition worsens.  Duration of encounter: .  Note by: Edward Jolly, MD Date: 12/03/2021; Time: 2:52 PM

## 2021-12-04 ENCOUNTER — Other Ambulatory Visit: Payer: Self-pay | Admitting: Cardiovascular Disease

## 2021-12-04 DIAGNOSIS — Z7901 Long term (current) use of anticoagulants: Secondary | ICD-10-CM

## 2021-12-04 DIAGNOSIS — Z5181 Encounter for therapeutic drug level monitoring: Secondary | ICD-10-CM

## 2021-12-04 DIAGNOSIS — I359 Nonrheumatic aortic valve disorder, unspecified: Secondary | ICD-10-CM

## 2021-12-04 DIAGNOSIS — Z952 Presence of prosthetic heart valve: Secondary | ICD-10-CM

## 2021-12-04 NOTE — Telephone Encounter (Signed)
Prescription refill request received for warfarin Lov: 05/14/21 Excell Seltzer) Next INR check: 12/05/21 Warfarin tablet strength: 5mg   Appropriate dose and refill sent to requested pharmacy.

## 2021-12-05 ENCOUNTER — Ambulatory Visit (INDEPENDENT_AMBULATORY_CARE_PROVIDER_SITE_OTHER): Payer: Medicare Other | Admitting: *Deleted

## 2021-12-05 DIAGNOSIS — I359 Nonrheumatic aortic valve disorder, unspecified: Secondary | ICD-10-CM

## 2021-12-05 DIAGNOSIS — Z5181 Encounter for therapeutic drug level monitoring: Secondary | ICD-10-CM | POA: Diagnosis not present

## 2021-12-05 DIAGNOSIS — Z952 Presence of prosthetic heart valve: Secondary | ICD-10-CM

## 2021-12-05 DIAGNOSIS — Z7901 Long term (current) use of anticoagulants: Secondary | ICD-10-CM | POA: Diagnosis not present

## 2021-12-05 LAB — POCT INR: INR: 2.2 (ref 2.0–3.0)

## 2021-12-05 NOTE — Patient Instructions (Signed)
Description   Today take 2 tablets then continue taking Warfarin 1.5 tablets daily except 2 tablets on Fridays. Recheck INR in 6 weeks.  [facility] [phone] & Fax number is [phone]

## 2021-12-06 ENCOUNTER — Encounter: Payer: Self-pay | Admitting: Internal Medicine

## 2021-12-06 LAB — TOXASSURE SELECT 13 (MW), URINE

## 2021-12-31 ENCOUNTER — Other Ambulatory Visit: Payer: Self-pay | Admitting: Student in an Organized Health Care Education/Training Program

## 2021-12-31 ENCOUNTER — Telehealth: Payer: Self-pay | Admitting: Student in an Organized Health Care Education/Training Program

## 2021-12-31 DIAGNOSIS — G894 Chronic pain syndrome: Secondary | ICD-10-CM

## 2021-12-31 DIAGNOSIS — G589 Mononeuropathy, unspecified: Secondary | ICD-10-CM

## 2021-12-31 DIAGNOSIS — M5416 Radiculopathy, lumbar region: Secondary | ICD-10-CM

## 2021-12-31 DIAGNOSIS — M47816 Spondylosis without myelopathy or radiculopathy, lumbar region: Secondary | ICD-10-CM

## 2021-12-31 NOTE — Telephone Encounter (Signed)
Patient called to get pregabalin filled and was told his script was out. Should be one more refill. Please check this out

## 2021-12-31 NOTE — Telephone Encounter (Signed)
Rx request sent to Dr. Lateef 

## 2021-12-31 NOTE — Telephone Encounter (Signed)
Patient notified

## 2022-01-17 ENCOUNTER — Encounter: Payer: Medicare Other | Admitting: Physician Assistant

## 2022-01-21 ENCOUNTER — Ambulatory Visit (INDEPENDENT_AMBULATORY_CARE_PROVIDER_SITE_OTHER): Payer: Medicare Other | Admitting: *Deleted

## 2022-01-21 DIAGNOSIS — Z7901 Long term (current) use of anticoagulants: Secondary | ICD-10-CM

## 2022-01-21 DIAGNOSIS — Z5181 Encounter for therapeutic drug level monitoring: Secondary | ICD-10-CM

## 2022-01-21 DIAGNOSIS — Z952 Presence of prosthetic heart valve: Secondary | ICD-10-CM

## 2022-01-21 DIAGNOSIS — I359 Nonrheumatic aortic valve disorder, unspecified: Secondary | ICD-10-CM

## 2022-01-21 LAB — POCT INR: INR: 2.9 (ref 2.0–3.0)

## 2022-01-21 NOTE — Patient Instructions (Signed)
Description   Continue taking Warfarin 1.5 tablets daily except 2 tablets on Fridays. Recheck INR in 6 weeks. [facility] [phone] or [phone] & Fax number is [phone]

## 2022-02-07 ENCOUNTER — Other Ambulatory Visit: Payer: Self-pay | Admitting: *Deleted

## 2022-02-07 ENCOUNTER — Telehealth: Payer: Self-pay | Admitting: Student in an Organized Health Care Education/Training Program

## 2022-02-07 NOTE — Telephone Encounter (Signed)
Called to [facility] and they do have Rx just do not have medication to fill.  They said that the patient could call after 3 to see if medication comes in.    Called patient and spoke with [name], wife of patient.  She stated yesterday they could not find the Rx.  I did tell her she could call after 3 and they may get the medication in.  She states that she may want to have it changed to [facility].  I told her I would be glad to try and do that for her, I do not have a provider in office today so it may not get taken care of til Tuesday.  Her choice to [facility] and see if it comes in or have Rx switched to pharmacy that has medication. She will call me back to let me know.

## 2022-02-07 NOTE — Telephone Encounter (Signed)
Patient called asking about a script for pain meds. He should have refill dated 02-02-22 but pharmacy says no.

## 2022-02-07 NOTE — Telephone Encounter (Signed)
Medication refill sent to [facility] with no guarantee that he will take care of this today.  Left [facility] and told patient to check with them after 3 if no word from [facility].  Will [facility] the [facility] on Tuesday if [facility] Rx is sent by [facility]

## 2022-02-11 MED ORDER — OXYCODONE-ACETAMINOPHEN 7.5-325 MG PO TABS: 1.0000 | ORAL_TABLET | Freq: Four times a day (QID) | ORAL | 0 refills | Status: DC | PRN

## 2022-02-11 NOTE — Telephone Encounter (Signed)
Oxycodone - apap 7.5 - 325 mg that was sent to [facility] on Sept 5th cancelled.  PMP reflects that he was able to pick-up original Rx at [facility] for this medication.

## 2022-02-11 NOTE — Telephone Encounter (Signed)
Rx at [facility] cancelled.  PMP reflects that he was able to fill at [facility] on 02/07/22.

## 2022-02-12 ENCOUNTER — Encounter: Payer: Self-pay | Admitting: Internal Medicine

## 2022-02-12 ENCOUNTER — Telehealth: Payer: Self-pay

## 2022-02-12 NOTE — Telephone Encounter (Signed)
Attempted to call patient, message left. 

## 2022-02-12 NOTE — Telephone Encounter (Signed)
Left a vm saying he needs to speak to a nurse about his prescription. Please call.

## 2022-02-12 NOTE — Telephone Encounter (Signed)
Patient questioning that the prescription is for #90 tabs, with instructions every 6 hours. I informed him that the directions are "as needed", not to be taken around the clock.

## 2022-02-27 ENCOUNTER — Ambulatory Visit
Payer: Medicare Other | Attending: Student in an Organized Health Care Education/Training Program | Admitting: Student in an Organized Health Care Education/Training Program

## 2022-02-27 ENCOUNTER — Encounter: Payer: Self-pay | Admitting: Student in an Organized Health Care Education/Training Program

## 2022-02-27 VITALS — BP 146/72 | HR 62 | Temp 97.1°F | Resp 17 | Ht 73.5 in | Wt 272.0 lb

## 2022-02-27 DIAGNOSIS — G894 Chronic pain syndrome: Secondary | ICD-10-CM | POA: Diagnosis present

## 2022-02-27 DIAGNOSIS — M47816 Spondylosis without myelopathy or radiculopathy, lumbar region: Secondary | ICD-10-CM | POA: Diagnosis present

## 2022-02-27 DIAGNOSIS — M5416 Radiculopathy, lumbar region: Secondary | ICD-10-CM

## 2022-02-27 DIAGNOSIS — M1612 Unilateral primary osteoarthritis, left hip: Secondary | ICD-10-CM | POA: Diagnosis present

## 2022-02-27 DIAGNOSIS — M5136 Other intervertebral disc degeneration, lumbar region: Secondary | ICD-10-CM | POA: Diagnosis present

## 2022-02-27 DIAGNOSIS — M1711 Unilateral primary osteoarthritis, right knee: Secondary | ICD-10-CM | POA: Diagnosis present

## 2022-02-27 DIAGNOSIS — M51369 Other intervertebral disc degeneration, lumbar region without mention of lumbar back pain or lower extremity pain: Secondary | ICD-10-CM

## 2022-02-27 DIAGNOSIS — G589 Mononeuropathy, unspecified: Secondary | ICD-10-CM | POA: Diagnosis present

## 2022-02-27 MED ORDER — OXYCODONE-ACETAMINOPHEN 10-325 MG PO TABS: 1.0000 | ORAL_TABLET | Freq: Three times a day (TID) | ORAL | 0 refills | Status: AC | PRN

## 2022-02-27 NOTE — Progress Notes (Signed)
PROVIDER NOTE: Information contained herein reflects review and annotations entered in association with encounter. Interpretation of such information and data should be left to medically-trained personnel. Information provided to patient can be located elsewhere in the medical record under "Patient Instructions". Document created using STT-dictation technology, any transcriptional errors that may result from process are unintentional.    Patient: [name]  Service Category: E/M  Provider: Gillis Santa, MD  DOB: 06-05-1961  DOS: 02/27/2022  Specialty: Interventional Pain Management  MRN: [id number]  Setting: Ambulatory outpatient  PCP: Irene Pap, PA-C  Type: Established Patient    Referring Provider: Marcellina Millin  Location: Office  Delivery: Face-to-face     HPI  [name], a 61 y.o. year old male, is here today because of his Chronic pain syndrome [G89.4]. [name]'s primary complain today is Back Pain (lower)  Last encounter: My last encounter with him was on 12/03/21  Pertinent problems: [name] has OBESITY; Obstructive sleep apnea; Aortic valve disorder; S/P aortic valve replacement; Long term current use of anticoagulant; Chronic pain syndrome; Pain in joint, pelvic region and thigh; Pain in right hip; and Lumbar radiculopathy, chronic on their pertinent problem list. Pain Assessment: Severity of Chronic pain is reported as a 9 /10. Location: Back Lower/throbbing pain down right leg to knee; numbness down left leg to calf. Onset: More than a month ago. Quality: Throbbing, Numbness. Timing: Constant. Modifying factor(s): "meds not helping like they used to". Vitals:  height is 6' 1.5" (1.867 m) and weight is 272 lb (123.4 kg). His temporal temperature is 97.1 F (36.2 C) (abnormal). His blood pressure is 146/72 (abnormal) and his pulse is 62. His respiration is 17 and oxygen saturation is 95%.   Reason for encounter: medication management.     Patient's oxycodone was weaned at his last visit from 10 mg 4 times daily as needed to 7.5 mg 3 times daily as needed.  He states that he is having a lot of difficulty managing his pain.  He is having increased low back pain with radiation down to right greater than left leg.  We discussed increasing his dose back to 10 mg every 8 hours as needed.  Patient in agreement with plan.   Pharmacotherapy Assessment  Analgesic: Percocet 10 mg, 3 times daily as needed, quantity 90/month MME equals 45   Monitoring: [place] PMP: PDMP reviewed during this encounter.       Pharmacotherapy: No side-effects or adverse reactions reported. Compliance: No problems identified. Effectiveness: Clinically acceptable.  UDS:  Summary  Date Value Ref Range Status  12/03/2021 Note  Final    Comment:    ==================================================================== [employer] 13 (MW) ==================================================================== Test                             Result       Flag       Units  Drug Present and Declared for Prescription Verification   Oxycodone                      1289         EXPECTED   ng/mg creat   Oxymorphone                    1616         EXPECTED   ng/mg creat   Noroxycodone  3903         EXPECTED   ng/mg creat   Noroxymorphone                 770          EXPECTED   ng/mg creat    Sources of oxycodone are scheduled prescription medications.    Oxymorphone, noroxycodone, and noroxymorphone are expected    metabolites of oxycodone. Oxymorphone is also available as a    scheduled prescription medication.  ==================================================================== Test                      Result    Flag   Units      Ref Range   Creatinine              123              mg/dL      >=20 ==================================================================== Declared Medications:  The flagging and interpretation on this report are  based on the  following declared medications.  Unexpected results may arise from  inaccuracies in the declared medications.   **Note: The testing scope of this panel includes these medications:   Oxycodone (Percocet)   **Note: The testing scope of this panel does not include the  following reported medications:   Acetaminophen (Percocet)  Atorvastatin (Lipitor)  Furosemide (Lasix)  Metoprolol (Toprol)  Multivitamin (Centrum)  Naloxone (Narcan)  Potassium Chloride  Pregabalin (Lyrica)  Vitamin C  Vitamin D  Warfarin (Coumadin) ==================================================================== For clinical consultation, please call [phone]. ====================================================================       ROS  Constitutional: Denies any fever or chills Gastrointestinal: No reported hemesis, hematochezia, vomiting, or acute GI distress Musculoskeletal:  +LBP, bilateral leg pain  Neurological: No reported episodes of acute onset apraxia, aphasia, dysarthria, agnosia, amnesia, paralysis, loss of coordination, or loss of consciousness  Medication Review  Centrum Silver Adult 50+, Vitamin D, ascorbic acid, atorvastatin, furosemide, metoprolol succinate, naloxone, oxyCODONE-acetaminophen, potassium chloride, pregabalin, and warfarin  History Review  Allergy: [name] is allergic to nsaids. Drug: [name]  reports no history of drug use. Alcohol:  reports no history of alcohol use. Tobacco:  reports that he quit smoking about 32 years ago. His smoking use included cigarettes. He has a 1.80 pack-year smoking history. He has never used smokeless tobacco. Social: [name]  reports that he quit smoking about 32 years ago. His smoking use included cigarettes. He has a 1.80 pack-year smoking history. He has never used smokeless tobacco. He reports that he does not drink alcohol and does not use drugs. Medical:  has a past medical history of Ascending aortic  aneurysm and dissection, Chest pain, atypical, Coronary artery reimplantation, Hyperlipidemia, Hypertension, Nuclear stress test, Obesity, Psoriasis, Pure hypercholesterolemia (02/06/2013), S/P aortic valve and root replacement, Seizures ([place]), and Sleep apnea, obstructive. Surgical: Mr. Margraf  has a past surgical history that includes Replacement of aortic valve (12/06/2004); Ascending aortic dissection aneurysm (12/06/2004); Knee surgery; Corneal transplant; Endovenous ablation saphenous vein w/ laser (Right, 04/25/2020); and Endovenous ablation saphenous vein w/ laser (Left, 05/23/2020). Family: family history includes Alcohol abuse in his brother and father; Cancer in his brother and father; Cervical cancer in his mother and sister; Coronary artery disease in his mother; Diabetes in his father and mother; Kidney disease in an other family member; Obesity in his father.  Laboratory Chemistry Profile   Renal Lab Results  Component Value Date   BUN 14 07/19/2021   CREATININE 0.81 07/19/2021  BCR 17 07/19/2021   GFRAA 112 06/06/2020   GFRNONAA 97 06/06/2020     Hepatic Lab Results  Component Value Date   AST 11 07/19/2021   ALT 20 07/19/2021   ALBUMIN 3.9 07/19/2021   ALKPHOS 98 07/19/2021   LIPASE 21 07/26/2016     Electrolytes Lab Results  Component Value Date   NA 146 (H) 07/19/2021   K 3.7 07/19/2021   CL 109 (H) 07/19/2021   CALCIUM 9.2 07/19/2021   MG 1.9 03/18/2015     Bone No results found for: "VD25OH", "VD125OH2TOT", "FY1017PZ0", "CH8527PO2", "25OHVITD1", "25OHVITD2", "25OHVITD3", "TESTOFREE", "TESTOSTERONE"   Inflammation (CRP: Acute Phase) (ESR: Chronic Phase) No results found for: "CRP", "ESRSEDRATE", "LATICACIDVEN"     Note: Above Lab results reviewed.  Recent Imaging Review  DG Chest 2 View CLINICAL DATA:  61 year old male with a history of excessive weight loss  EXAM: CHEST - 2 VIEW  COMPARISON:  06/22/2018  FINDINGS: Cardiomediastinal silhouette  unchanged in size and contour. No evidence of central vascular congestion. No interlobular septal thickening.  Surgical changes of median sternotomy and aortic valve repair.  No pneumothorax or pleural effusion. Coarsened interstitial markings, with no confluent airspace disease.  No acute displaced fracture. Degenerative changes of the spine.  IMPRESSION: Negative for acute cardiopulmonary disease.  Surgical changes of median sternotomy and aortic valve repair  Electronically Signed   By: Corrie Mckusick D.O.   On: 02/14/2021 14:13  Note: Reviewed        Physical Exam  General appearance: Well nourished, well developed, and well hydrated. In no apparent acute distress Mental status: Alert, oriented x 3 (person, place, & time)       Respiratory: No evidence of acute respiratory distress Eyes: PERLA Vitals: BP (!) 146/72   Pulse 62   Temp (!) 97.1 F (36.2 C) (Temporal)   Resp 17   Ht 6' 1.5" (1.867 m)   Wt 272 lb (123.4 kg)   SpO2 95%   BMI 35.40 kg/m  BMI: Estimated body mass index is 35.4 kg/m as calculated from the following:   Height as of this encounter: 6' 1.5" (1.867 m).   Weight as of this encounter: 272 lb (123.4 kg). Ideal: Ideal body weight: 81 kg (178 lb 10.9 oz) Adjusted ideal body weight: 98 kg (216 lb 0.1 oz)  Cervical Spine Exam  Skin & Axial Inspection: No masses, redness, edema, swelling, or associated skin lesions Alignment: Symmetrical Functional ROM: Pain restricted ROM      Stability: No instability detected Muscle Tone/Strength: Functionally intact. No obvious neuro-muscular anomalies detected. Sensory (Neurological): Musculoskeletal pain pattern Palpation: No palpable anomalies                          Upper Extremity (UE) Exam      Side: Right upper extremity   Side: Left upper extremity    Skin & Extremity Inspection: Skin color, temperature, and hair growth are WNL. No peripheral edema or cyanosis. No masses, redness, swelling,  asymmetry, or associated skin lesions. No contractures.   Skin & Extremity Inspection: Skin color, temperature, and hair growth are WNL. No peripheral edema or cyanosis. No masses, redness, swelling, asymmetry, or associated skin lesions. No contractures.    Functional ROM: Unrestricted ROM           Functional ROM: Unrestricted ROM            Muscle Tone/Strength: Functionally intact. No obvious neuro-muscular anomalies detected.  Muscle Tone/Strength: Functionally intact. No obvious neuro-muscular anomalies detected.    Sensory (Neurological): Unimpaired           Sensory (Neurological): Unimpaired              Thoracic Spine Area Exam  Skin & Axial Inspection: No masses, redness, or swelling Alignment: Symmetrical Functional ROM: Decreased ROM Stability: No instability detected Muscle Tone/Strength: Functionally intact. No obvious neuro-muscular anomalies detected. Sensory (Neurological): Musculoskeletal pain pattern Muscle strength & Tone: No palpable anomalies   Lumbar Exam  Skin & Axial Inspection: No masses, redness, or swelling Alignment: Symmetrical Functional ROM: Decreased ROM affecting both sides Stability: No instability detected Muscle Tone/Strength: Functionally intact. No obvious neuro-muscular anomalies detected. Sensory (Neurological): Musculoskeletal pain pattern   Gait & Posture Assessment  Ambulation: Limited Gait: Age-related, antalgic gait pattern Posture: Difficulty standing up straight, due to pain    Lower Extremity Exam      Side: Right lower extremity   Side: Left lower extremity  Stability: No instability observed           Stability: No instability observed          Skin & Extremity Inspection: Edema   Skin & Extremity Inspection: Pitting edema venous stasis changes also noted  Functional ROM: Unrestricted ROM                   Functional ROM: Unrestricted ROM                  Muscle Tone/Strength: Functionally intact. No obvious neuro-muscular  anomalies detected.   Muscle Tone/Strength: Functionally intact. No obvious neuro-muscular anomalies detected.  Sensory (Neurological): Neurogenic pain pattern         Sensory (Neurological): Neurogenic pain pattern         Assessment   Status Diagnosis  Controlled Controlled Controlled 1. Chronic pain syndrome   2. Lumbar radiculopathy   3. Lumbar facet arthropathy   4. Primary osteoarthritis of right knee   5. Lumbar spondylosis   6. Mononeuropathy   7. Lumbar degenerative disc disease   8. Primary osteoarthritis of left hip        Plan of Care  [name] has a current medication list which includes the following long-term medication(s): atorvastatin, furosemide, metoprolol succinate, potassium chloride, pregabalin, and warfarin.  Pharmacotherapy (Medications Ordered): Meds ordered this encounter  Medications   oxyCODONE-acetaminophen (PERCOCET) 10-325 MG tablet    Sig: Take 1 tablet by mouth every 8 (eight) hours as needed for pain. Must last 30 days.    Dispense:  90 tablet    Refill:  0    Chronic Pain: STOP Act (Not applicable) Fill 1 day early if closed on refill date. Avoid benzodiazepines within 8 hours of opioids   oxyCODONE-acetaminophen (PERCOCET) 10-325 MG tablet    Sig: Take 1 tablet by mouth every 8 (eight) hours as needed for pain. Must last 30 days.    Dispense:  90 tablet    Refill:  0    Chronic Pain: STOP Act (Not applicable) Fill 1 day early if closed on refill date. Avoid benzodiazepines within 8 hours of opioids   oxyCODONE-acetaminophen (PERCOCET) 10-325 MG tablet    Sig: Take 1 tablet by mouth every 8 (eight) hours as needed for pain. Must last 30 days.    Dispense:  90 tablet    Refill:  0    Chronic Pain: STOP Act (Not applicable) Fill 1 day early if closed on refill date.  Avoid benzodiazepines within 8 hours of opioids   Continue Lyrica 75 mg twice daily.  No prescription refill needed.  No orders of the defined types were placed  in this encounter.    Encourage patient to continue with dieting and weight loss strategies as this will undoubtedly have a positive impact on his pain.  Follow-up plan:   Return in about 3 months (around 05/29/2022) for Medication Management, in person.   Recent Visits Date Type Provider Dept  12/03/21 Office Visit Lance Santa, MD [facility]  Showing recent visits within past 90 days and meeting all other requirements Today's Visits Date Type Provider Dept  02/27/22 Office Visit Lance Santa, MD [facility]  Showing today's visits and meeting all other requirements Future Appointments Date Type Provider Dept  05/27/22 Appointment Lance Santa, MD [facility]  Showing future appointments within next 90 days and meeting all other requirements  I discussed the assessment and treatment plan with the patient. The patient was provided an opportunity to ask questions and all were answered. The patient agreed with the plan and demonstrated an understanding of the instructions.  Patient advised to call back or seek an in-person evaluation if the symptoms or condition worsens.  Duration of encounter: 15mnutes.  Note by: BGillis Santa MD Date: 02/27/2022; Time: 2:51 PM

## 2022-02-27 NOTE — Progress Notes (Signed)
Nursing Pain Medication Assessment:  Safety precautions to be maintained throughout the outpatient stay will include: orient to surroundings, keep bed in low position, maintain call bell within reach at all times, provide assistance with transfer out of bed and ambulation.  Medication Inspection Compliance: Pill count conducted under aseptic conditions, in front of the patient. Neither the pills nor the bottle was removed from the patient's sight at any time. Once count was completed pills were immediately returned to the patient in their original bottle.  Medication: Oxycodone/APAP Pill/Patch Count:  0 of 90 pills remain Pill/Patch Appearance: Markings consistent with prescribed medication Bottle Appearance: Standard pharmacy container. Clearly labeled. Filled Date: 09 / 01 / 2023 Last Medication intake:  Today

## 2022-03-04 ENCOUNTER — Ambulatory Visit: Payer: Medicare Other | Attending: Cardiovascular Disease | Admitting: *Deleted

## 2022-03-04 DIAGNOSIS — Z5181 Encounter for therapeutic drug level monitoring: Secondary | ICD-10-CM

## 2022-03-04 DIAGNOSIS — Z7901 Long term (current) use of anticoagulants: Secondary | ICD-10-CM | POA: Diagnosis not present

## 2022-03-04 DIAGNOSIS — I359 Nonrheumatic aortic valve disorder, unspecified: Secondary | ICD-10-CM | POA: Diagnosis not present

## 2022-03-04 DIAGNOSIS — Z952 Presence of prosthetic heart valve: Secondary | ICD-10-CM

## 2022-03-04 LAB — POCT INR: INR: 2.3 (ref 2.0–3.0)

## 2022-03-04 NOTE — Patient Instructions (Signed)
Description   Today take 2 tablets then continue taking Warfarin 1.5 tablets daily except 2 tablets on Fridays. Recheck INR in 5 weeks. [facility] [phone] or [phone] & Fax number is [phone]

## 2022-03-13 ENCOUNTER — Telehealth: Payer: Self-pay | Admitting: Cardiovascular Disease

## 2022-03-13 DIAGNOSIS — E870 Hyperosmolality and hypernatremia: Secondary | ICD-10-CM

## 2022-03-13 MED ORDER — FUROSEMIDE 40 MG PO TABS: 40.0000 mg | ORAL_TABLET | Freq: Two times a day (BID) | ORAL | 1 refills | Status: AC

## 2022-03-13 NOTE — Telephone Encounter (Signed)
Pt's medication was sent to pt's pharmacy as requested. Confirmation received.  °

## 2022-03-13 NOTE — Telephone Encounter (Signed)
*  STAT* If patient is at the pharmacy, call can be transferred to refill team.   1. Which medications need to be refilled? (please list name of each medication and dose if known) furosemide (LASIX) 40 MG tablet  2. Which pharmacy/location (including street and city if local pharmacy) is medication to be sent to? [id number] - , [place] - [address] AT [address]  3. Do they need a 30 day or 90 day supply? [place]

## 2022-03-15 ENCOUNTER — Other Ambulatory Visit: Payer: Self-pay | Admitting: Physician Assistant

## 2022-03-15 DIAGNOSIS — I10 Essential (primary) hypertension: Secondary | ICD-10-CM

## 2022-03-16 ENCOUNTER — Other Ambulatory Visit: Payer: Self-pay | Admitting: Cardiovascular Disease

## 2022-03-16 DIAGNOSIS — Z952 Presence of prosthetic heart valve: Secondary | ICD-10-CM

## 2022-03-16 DIAGNOSIS — I359 Nonrheumatic aortic valve disorder, unspecified: Secondary | ICD-10-CM

## 2022-03-16 DIAGNOSIS — Z5181 Encounter for therapeutic drug level monitoring: Secondary | ICD-10-CM

## 2022-03-16 DIAGNOSIS — Z7901 Long term (current) use of anticoagulants: Secondary | ICD-10-CM

## 2022-03-17 ENCOUNTER — Telehealth: Payer: Self-pay | Admitting: Cardiovascular Disease

## 2022-03-17 NOTE — Telephone Encounter (Signed)
Pt c/o medication issue:  1. Name of Medication: osteo bi-flex   2. How are you currently taking this medication (dosage and times per day)? Not currently taking  3. Are you having a reaction (difficulty breathing--STAT)? no  4. What is your medication issue? Patient wants to know if it is okay for him to take osteo bi-flex for joint health. He says he was told to contact the coumadin clinic to see if it would interact with his coumadin.

## 2022-03-17 NOTE — Telephone Encounter (Signed)
Returned call to patient.  Explained that there is some concern for OsteoBiflex increasing INR.  Suggested that if he wants to start the medication, he should do so about 7-10 days prior to his next INR check.  Then if there is an elevation in INR it can be corrected.  Patient voiced understanding.

## 2022-03-18 ENCOUNTER — Encounter: Payer: Self-pay | Admitting: Internal Medicine

## 2022-03-29 ENCOUNTER — Other Ambulatory Visit: Payer: Self-pay | Admitting: Medical

## 2022-03-29 ENCOUNTER — Other Ambulatory Visit: Payer: Self-pay | Admitting: Cardiovascular Disease

## 2022-03-29 ENCOUNTER — Other Ambulatory Visit: Payer: Self-pay | Admitting: Physician Assistant

## 2022-03-29 DIAGNOSIS — I10 Essential (primary) hypertension: Secondary | ICD-10-CM

## 2022-03-29 DIAGNOSIS — E782 Mixed hyperlipidemia: Secondary | ICD-10-CM

## 2022-04-08 ENCOUNTER — Ambulatory Visit: Payer: Medicare Other | Attending: Cardiology | Admitting: *Deleted

## 2022-04-08 ENCOUNTER — Ambulatory Visit: Payer: Medicare Other

## 2022-04-08 DIAGNOSIS — I480 Paroxysmal atrial fibrillation: Secondary | ICD-10-CM | POA: Diagnosis not present

## 2022-04-08 DIAGNOSIS — I359 Nonrheumatic aortic valve disorder, unspecified: Secondary | ICD-10-CM

## 2022-04-08 DIAGNOSIS — Z5181 Encounter for therapeutic drug level monitoring: Secondary | ICD-10-CM

## 2022-04-08 DIAGNOSIS — Z7901 Long term (current) use of anticoagulants: Secondary | ICD-10-CM

## 2022-04-08 LAB — POCT INR: POC INR: 2.3

## 2022-04-08 NOTE — Patient Instructions (Signed)
Description    -Take 2 tablet of warfarin today -Then START taking warfarin 1.5 tablets daily except for 2 tablets on Mondays and Fridays. Recheck INR in 3 weeks.[facility] [phone] or [phone] & Fax number is [phone]

## 2022-04-22 ENCOUNTER — Encounter: Payer: Self-pay | Admitting: Internal Medicine

## 2022-04-23 ENCOUNTER — Telehealth: Payer: Self-pay | Admitting: Student in an Organized Health Care Education/Training Program

## 2022-04-23 NOTE — Telephone Encounter (Signed)
Patient called to cancel appt for 05-27-22. He has moved to [place] and wants to get a referral for pain management there. I explained he would have to find a doctor and get their fax number. We can then send his records. He will call back

## 2022-04-30 ENCOUNTER — Telehealth: Payer: Self-pay | Admitting: *Deleted

## 2022-04-30 NOTE — Telephone Encounter (Signed)
Called pt since INR is due; there was no answer and mailbox is full. Will try back at a later date.

## 2022-05-05 ENCOUNTER — Ambulatory Visit (HOSPITAL_COMMUNITY): Payer: Medicare Other | Attending: Cardiovascular Disease

## 2022-05-06 ENCOUNTER — Telehealth: Payer: Self-pay | Admitting: *Deleted

## 2022-05-06 NOTE — Telephone Encounter (Signed)
Called pt since he is due for an INR check/lab draw. There was no answer and then voicemail is full. Will try back at a later date.

## 2022-05-09 ENCOUNTER — Telehealth: Payer: Self-pay

## 2022-05-09 ENCOUNTER — Encounter (HOSPITAL_COMMUNITY): Payer: Self-pay | Admitting: Cardiovascular Disease

## 2022-05-09 NOTE — Telephone Encounter (Signed)
Called and spoke with pt and pt's wife over the phone. Pt stated they have moved to [place].   He is going to new provider on 05/15/22 in [place], [place], [facility]. Confirmed this provider will manage Warfarin/INR.

## 2022-05-13 ENCOUNTER — Telehealth: Payer: Self-pay | Admitting: Student in an Organized Health Care Education/Training Program

## 2022-05-13 ENCOUNTER — Other Ambulatory Visit: Payer: Self-pay | Admitting: Medical

## 2022-05-13 DIAGNOSIS — E782 Mixed hyperlipidemia: Secondary | ICD-10-CM

## 2022-05-13 NOTE — Telephone Encounter (Signed)
PT stated that [facility] [place] PT wants to know if those 2 prescription can [facility] [place]. PT stated that he has moved. Please give patient a call. Thanks

## 2022-05-13 NOTE — Telephone Encounter (Signed)
Requesting script for [facility] [place] [place].  I told patient we don't send opioid scripts out of state, but I would ask.

## 2022-05-26 NOTE — Telephone Encounter (Signed)
Patient notified

## 2022-05-27 ENCOUNTER — Encounter: Payer: Medicare Other | Admitting: Student in an Organized Health Care Education/Training Program

## 2022-07-10 ENCOUNTER — Telehealth: Payer: Self-pay | Admitting: Physician Assistant

## 2022-07-10 NOTE — Telephone Encounter (Signed)
Spoke with patient to schedule AWV.  Patient stated he has moved to [facility]

## 2022-07-14 ENCOUNTER — Other Ambulatory Visit: Payer: Self-pay | Admitting: Student in an Organized Health Care Education/Training Program

## 2022-07-14 DIAGNOSIS — M5416 Radiculopathy, lumbar region: Secondary | ICD-10-CM

## 2022-07-14 DIAGNOSIS — G894 Chronic pain syndrome: Secondary | ICD-10-CM

## 2022-07-14 DIAGNOSIS — G589 Mononeuropathy, unspecified: Secondary | ICD-10-CM

## 2022-07-14 DIAGNOSIS — M47816 Spondylosis without myelopathy or radiculopathy, lumbar region: Secondary | ICD-10-CM

## 2022-07-15 ENCOUNTER — Ambulatory Visit: Payer: Medicare HMO | Admitting: Cardiovascular Disease

## 2022-07-24 ENCOUNTER — Encounter: Payer: Medicare Other | Admitting: Physician Assistant

## 2022-09-04 ENCOUNTER — Other Ambulatory Visit: Payer: Self-pay | Admitting: Medical

## 2022-09-04 DIAGNOSIS — E782 Mixed hyperlipidemia: Secondary | ICD-10-CM

## 2022-09-16 ENCOUNTER — Other Ambulatory Visit: Payer: Self-pay | Admitting: Cardiovascular Disease

## 2023-01-11 IMAGING — CR DG CHEST 2V
2 series · 2 of 2 positions shown · non-contrast
Comparison: 06/22/2018

CLINICAL DATA: 60-year-old male with a history of excessive weight
loss

EXAM:
CHEST - 2 VIEW

[w chest pa]
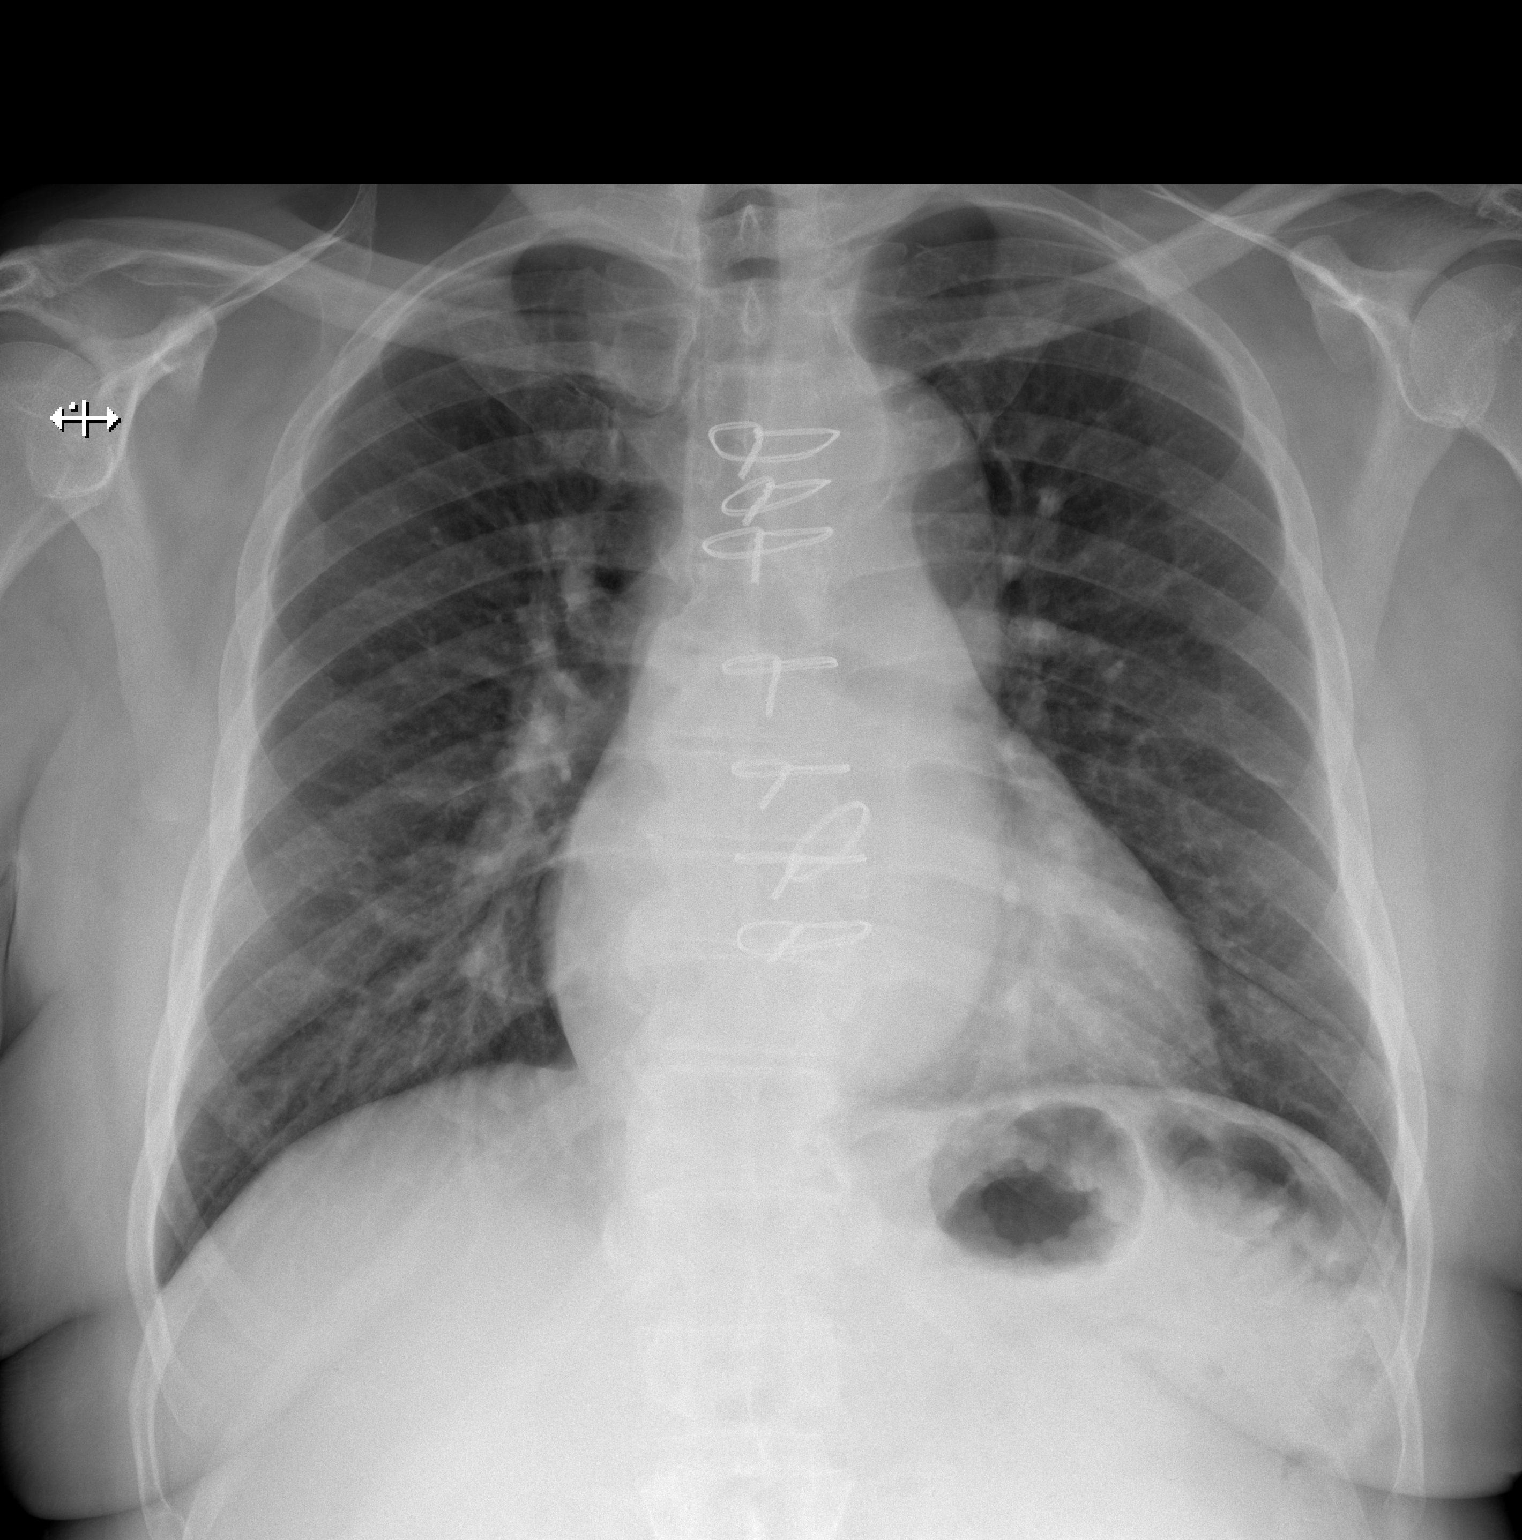

[w chest lat]
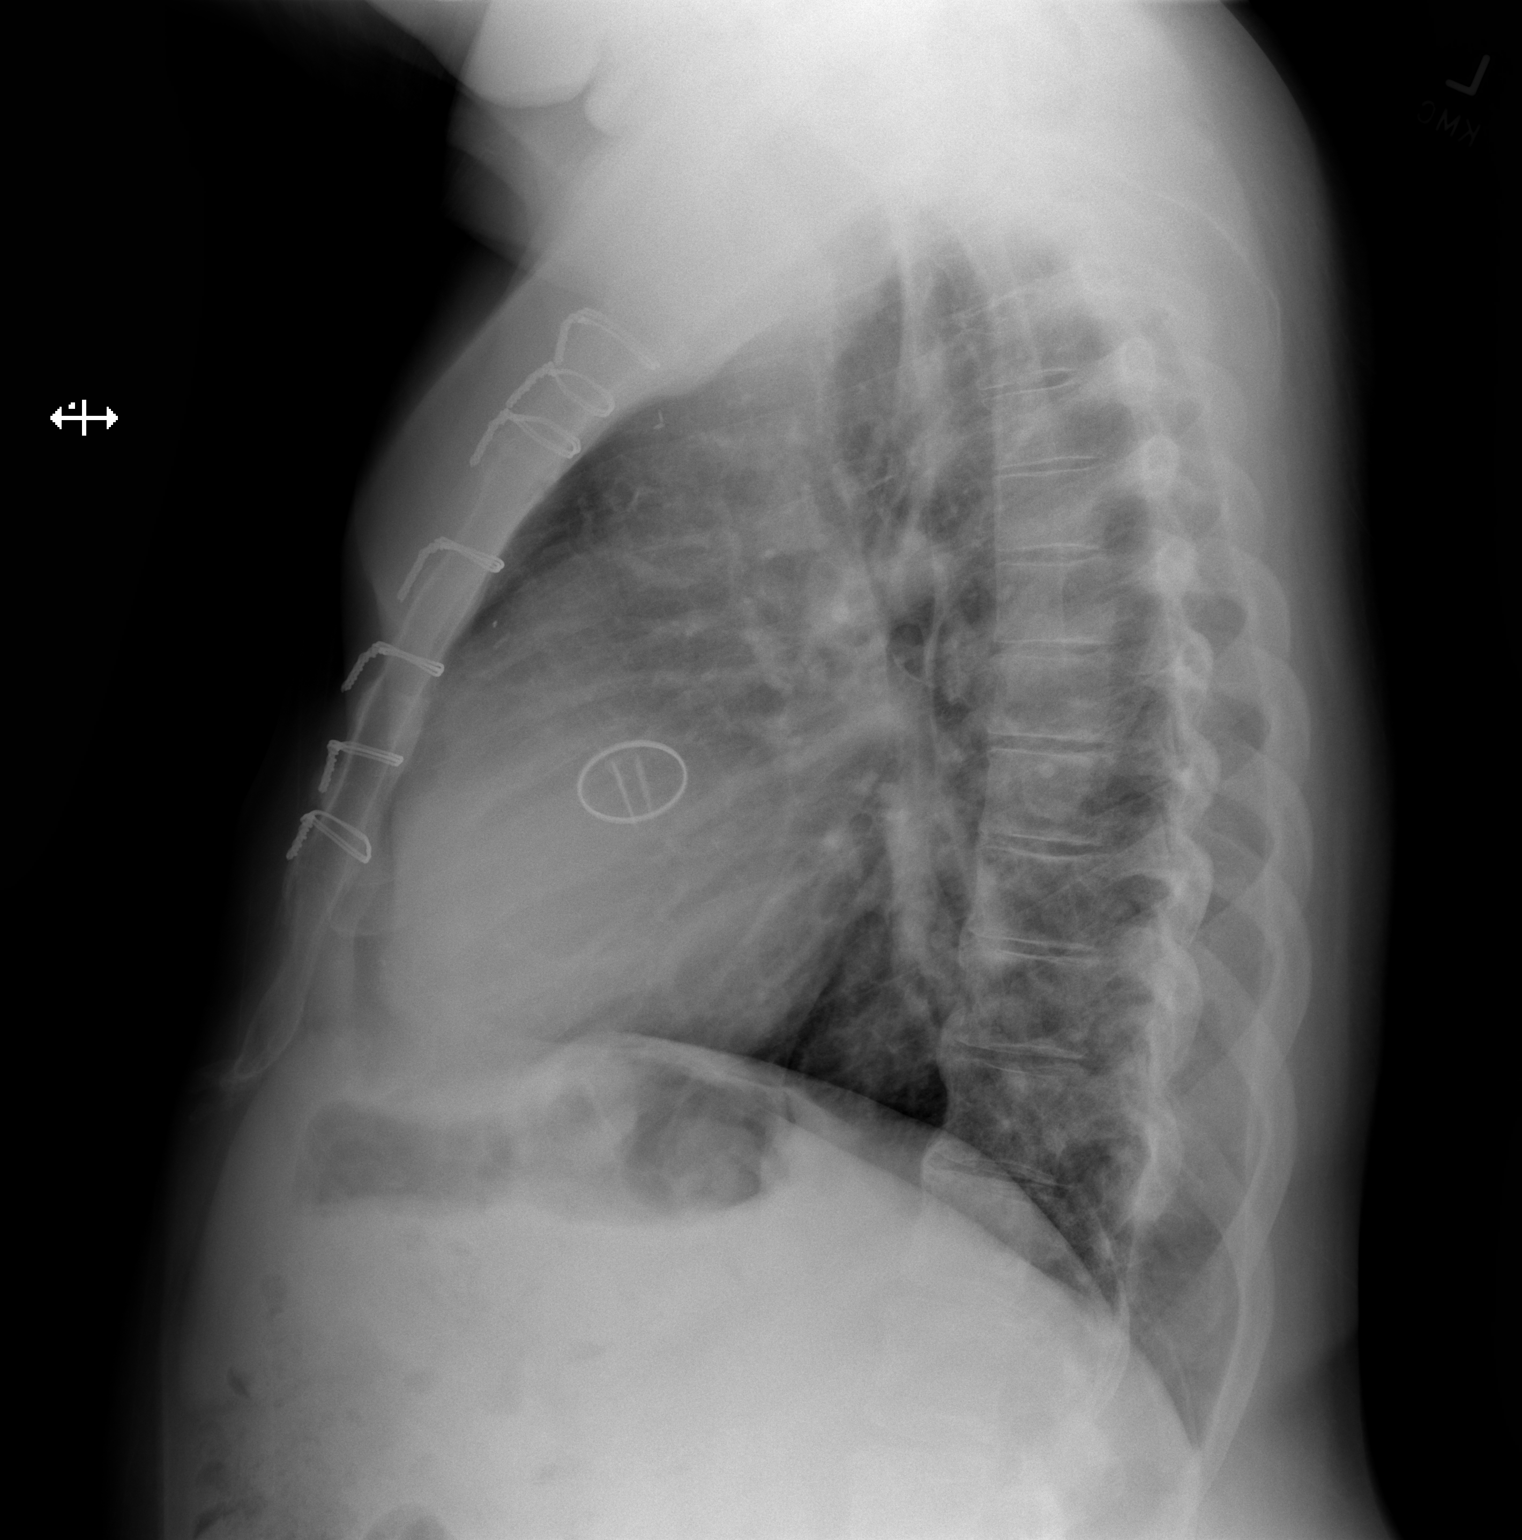

[2 of 2 positions shown; findings below may reference images not displayed]

FINDINGS: Cardiomediastinal silhouette unchanged in size and contour. No
evidence of central vascular congestion. No interlobular septal
thickening.

Surgical changes of median sternotomy and aortic valve repair.

No pneumothorax or pleural effusion. Coarsened interstitial
markings, with no confluent airspace disease.

No acute displaced fracture. Degenerative changes of the spine.
IMPRESSION: Negative for acute cardiopulmonary disease.

Surgical changes of median sternotomy and aortic valve repair

## 2023-01-20 ENCOUNTER — Encounter: Payer: Medicare Other | Admitting: Physician Assistant
